# Patient Record
Sex: Female | Born: 1935 | Race: White | Hispanic: No | Marital: Married | Smoking: Never smoker
Health system: Southern US, Community
[De-identification: ages and names within clinical notes are randomized; demographics above are authoritative.]

## PROBLEM LIST (undated history)

## (undated) DIAGNOSIS — R7301 Impaired fasting glucose: Secondary | ICD-10-CM

## (undated) DIAGNOSIS — K921 Melena: Secondary | ICD-10-CM

## (undated) DIAGNOSIS — E119 Type 2 diabetes mellitus without complications: Secondary | ICD-10-CM

## (undated) DIAGNOSIS — E785 Hyperlipidemia, unspecified: Secondary | ICD-10-CM

## (undated) DIAGNOSIS — M1990 Unspecified osteoarthritis, unspecified site: Secondary | ICD-10-CM

## (undated) DIAGNOSIS — I509 Heart failure, unspecified: Secondary | ICD-10-CM

## (undated) DIAGNOSIS — I639 Cerebral infarction, unspecified: Secondary | ICD-10-CM

## (undated) DIAGNOSIS — I350 Nonrheumatic aortic (valve) stenosis: Secondary | ICD-10-CM

## (undated) DIAGNOSIS — Z87442 Personal history of urinary calculi: Secondary | ICD-10-CM

## (undated) DIAGNOSIS — I499 Cardiac arrhythmia, unspecified: Secondary | ICD-10-CM

## (undated) DIAGNOSIS — I4891 Unspecified atrial fibrillation: Secondary | ICD-10-CM

## (undated) DIAGNOSIS — R0600 Dyspnea, unspecified: Secondary | ICD-10-CM

## (undated) DIAGNOSIS — C801 Malignant (primary) neoplasm, unspecified: Secondary | ICD-10-CM

## (undated) DIAGNOSIS — Z86010 Personal history of colonic polyps: Secondary | ICD-10-CM

## (undated) HISTORY — DX: Hyperlipidemia, unspecified: E78.5

## (undated) HISTORY — DX: Heart failure, unspecified: I50.9

## (undated) HISTORY — DX: Melena: K92.1

## (undated) HISTORY — DX: Unspecified atrial fibrillation: I48.91

## (undated) HISTORY — DX: Personal history of colonic polyps: Z86.010

## (undated) HISTORY — DX: Nonrheumatic aortic (valve) stenosis: I35.0

## (undated) HISTORY — PX: EYE SURGERY: SHX253

## (undated) HISTORY — DX: Impaired fasting glucose: R73.01

---

## 1998-05-04 ENCOUNTER — Ambulatory Visit (HOSPITAL_COMMUNITY): Admission: RE | Admit: 1998-05-04 | Discharge: 1998-05-04 | Payer: Self-pay | Admitting: Obstetrics & Gynecology

## 1999-05-05 ENCOUNTER — Ambulatory Visit (HOSPITAL_COMMUNITY): Admission: RE | Admit: 1999-05-05 | Discharge: 1999-05-05 | Payer: Self-pay | Admitting: Family Medicine

## 2000-11-23 ENCOUNTER — Encounter: Payer: Self-pay | Admitting: Family Medicine

## 2000-11-23 ENCOUNTER — Ambulatory Visit (HOSPITAL_COMMUNITY): Admission: RE | Admit: 2000-11-23 | Discharge: 2000-11-23 | Payer: Self-pay | Admitting: Family Medicine

## 2001-12-12 ENCOUNTER — Other Ambulatory Visit: Admission: RE | Admit: 2001-12-12 | Discharge: 2001-12-12 | Payer: Self-pay | Admitting: Dermatology

## 2002-09-19 ENCOUNTER — Ambulatory Visit (HOSPITAL_COMMUNITY): Admission: RE | Admit: 2002-09-19 | Discharge: 2002-09-19 | Payer: Self-pay | Admitting: Internal Medicine

## 2003-02-21 DIAGNOSIS — Z86010 Personal history of colonic polyps: Secondary | ICD-10-CM

## 2003-02-21 DIAGNOSIS — Z860101 Personal history of adenomatous and serrated colon polyps: Secondary | ICD-10-CM

## 2003-02-21 HISTORY — DX: Personal history of colonic polyps: Z86.010

## 2003-02-21 HISTORY — DX: Personal history of adenomatous and serrated colon polyps: Z86.0101

## 2003-03-12 ENCOUNTER — Ambulatory Visit (HOSPITAL_COMMUNITY): Admission: RE | Admit: 2003-03-12 | Discharge: 2003-03-12 | Payer: Self-pay | Admitting: Internal Medicine

## 2004-03-18 ENCOUNTER — Ambulatory Visit (HOSPITAL_COMMUNITY): Admission: RE | Admit: 2004-03-18 | Discharge: 2004-03-18 | Payer: Self-pay | Admitting: Internal Medicine

## 2005-03-21 ENCOUNTER — Ambulatory Visit (HOSPITAL_COMMUNITY): Admission: RE | Admit: 2005-03-21 | Discharge: 2005-03-21 | Payer: Self-pay | Admitting: Internal Medicine

## 2006-03-27 ENCOUNTER — Ambulatory Visit (HOSPITAL_COMMUNITY): Admission: RE | Admit: 2006-03-27 | Discharge: 2006-03-27 | Payer: Self-pay | Admitting: Internal Medicine

## 2006-04-03 ENCOUNTER — Encounter: Admission: RE | Admit: 2006-04-03 | Discharge: 2006-04-03 | Payer: Self-pay | Admitting: Internal Medicine

## 2007-02-21 HISTORY — PX: COLONOSCOPY: SHX174

## 2007-03-27 ENCOUNTER — Ambulatory Visit (HOSPITAL_COMMUNITY): Admission: RE | Admit: 2007-03-27 | Discharge: 2007-03-27 | Payer: Self-pay | Admitting: Internal Medicine

## 2007-05-13 ENCOUNTER — Encounter: Admission: RE | Admit: 2007-05-13 | Discharge: 2007-05-13 | Payer: Self-pay | Admitting: Internal Medicine

## 2008-03-24 ENCOUNTER — Encounter: Payer: Self-pay | Admitting: Cardiology

## 2008-03-24 LAB — CONVERTED CEMR LAB
LDL (calc): 108 mg/dL
TSH: 2.32 microintl units/mL

## 2008-04-21 ENCOUNTER — Ambulatory Visit: Payer: Self-pay | Admitting: Internal Medicine

## 2008-04-27 ENCOUNTER — Encounter: Payer: Self-pay | Admitting: Internal Medicine

## 2008-05-04 ENCOUNTER — Ambulatory Visit: Payer: Self-pay | Admitting: Internal Medicine

## 2008-05-04 ENCOUNTER — Ambulatory Visit (HOSPITAL_COMMUNITY): Admission: RE | Admit: 2008-05-04 | Discharge: 2008-05-04 | Payer: Self-pay | Admitting: Internal Medicine

## 2008-05-07 ENCOUNTER — Encounter: Payer: Self-pay | Admitting: Internal Medicine

## 2008-07-06 ENCOUNTER — Encounter: Admission: RE | Admit: 2008-07-06 | Discharge: 2008-07-06 | Payer: Self-pay | Admitting: Internal Medicine

## 2008-07-29 ENCOUNTER — Ambulatory Visit (HOSPITAL_COMMUNITY): Admission: RE | Admit: 2008-07-29 | Discharge: 2008-07-29 | Payer: Self-pay | Admitting: Internal Medicine

## 2008-07-29 ENCOUNTER — Encounter (INDEPENDENT_AMBULATORY_CARE_PROVIDER_SITE_OTHER): Payer: Self-pay | Admitting: Internal Medicine

## 2008-07-29 ENCOUNTER — Ambulatory Visit: Payer: Self-pay | Admitting: Cardiology

## 2008-08-13 ENCOUNTER — Ambulatory Visit: Payer: Self-pay | Admitting: Cardiology

## 2008-08-13 ENCOUNTER — Ambulatory Visit (HOSPITAL_COMMUNITY): Admission: RE | Admit: 2008-08-13 | Discharge: 2008-08-13 | Payer: Self-pay | Admitting: Cardiology

## 2008-08-17 ENCOUNTER — Encounter (HOSPITAL_COMMUNITY): Admission: RE | Admit: 2008-08-17 | Discharge: 2008-09-16 | Payer: Self-pay | Admitting: Cardiology

## 2008-08-17 ENCOUNTER — Ambulatory Visit: Payer: Self-pay | Admitting: Cardiology

## 2008-09-01 ENCOUNTER — Telehealth (INDEPENDENT_AMBULATORY_CARE_PROVIDER_SITE_OTHER): Payer: Self-pay

## 2008-09-09 DIAGNOSIS — I482 Chronic atrial fibrillation, unspecified: Secondary | ICD-10-CM | POA: Insufficient documentation

## 2008-09-09 DIAGNOSIS — I4891 Unspecified atrial fibrillation: Secondary | ICD-10-CM | POA: Insufficient documentation

## 2008-09-16 ENCOUNTER — Ambulatory Visit: Payer: Self-pay | Admitting: Cardiology

## 2008-09-24 ENCOUNTER — Encounter: Payer: Self-pay | Admitting: Cardiology

## 2008-09-24 LAB — CONVERTED CEMR LAB
INR: 0.9 (ref 0.0–1.5)
Prothrombin Time: 12.8 s (ref 11.6–15.2)

## 2008-09-28 ENCOUNTER — Encounter (INDEPENDENT_AMBULATORY_CARE_PROVIDER_SITE_OTHER): Payer: Self-pay

## 2008-09-30 ENCOUNTER — Encounter: Payer: Self-pay | Admitting: Cardiology

## 2008-09-30 ENCOUNTER — Ambulatory Visit: Payer: Self-pay | Admitting: Cardiology

## 2008-09-30 ENCOUNTER — Ambulatory Visit (HOSPITAL_COMMUNITY): Admission: RE | Admit: 2008-09-30 | Discharge: 2008-09-30 | Payer: Self-pay | Admitting: Cardiology

## 2008-10-01 ENCOUNTER — Encounter: Payer: Self-pay | Admitting: Cardiology

## 2008-10-01 ENCOUNTER — Ambulatory Visit: Payer: Self-pay | Admitting: Cardiology

## 2008-10-05 ENCOUNTER — Encounter: Payer: Self-pay | Admitting: *Deleted

## 2008-10-05 ENCOUNTER — Ambulatory Visit: Payer: Self-pay | Admitting: Cardiology

## 2008-10-05 LAB — CONVERTED CEMR LAB: POC INR: 2.2

## 2008-10-08 ENCOUNTER — Ambulatory Visit: Payer: Self-pay | Admitting: Cardiology

## 2008-10-15 ENCOUNTER — Ambulatory Visit: Payer: Self-pay

## 2008-10-28 ENCOUNTER — Ambulatory Visit: Payer: Self-pay | Admitting: Cardiology

## 2008-10-28 LAB — CONVERTED CEMR LAB: POC INR: 4.4

## 2008-11-05 ENCOUNTER — Ambulatory Visit: Payer: Self-pay

## 2008-11-18 ENCOUNTER — Ambulatory Visit: Payer: Self-pay | Admitting: Cardiology

## 2008-11-18 LAB — CONVERTED CEMR LAB: POC INR: 2.3

## 2008-12-07 ENCOUNTER — Ambulatory Visit: Payer: Self-pay | Admitting: Cardiology

## 2008-12-07 LAB — CONVERTED CEMR LAB: POC INR: 2.2

## 2009-01-04 ENCOUNTER — Ambulatory Visit: Payer: Self-pay | Admitting: Cardiology

## 2009-01-20 DIAGNOSIS — K921 Melena: Secondary | ICD-10-CM | POA: Insufficient documentation

## 2009-01-20 HISTORY — DX: Melena: K92.1

## 2009-02-03 ENCOUNTER — Telehealth (INDEPENDENT_AMBULATORY_CARE_PROVIDER_SITE_OTHER): Payer: Self-pay | Admitting: *Deleted

## 2009-02-03 ENCOUNTER — Inpatient Hospital Stay (HOSPITAL_COMMUNITY): Admission: EM | Admit: 2009-02-03 | Discharge: 2009-02-05 | Payer: Self-pay | Admitting: Emergency Medicine

## 2009-02-04 ENCOUNTER — Ambulatory Visit: Payer: Self-pay | Admitting: Internal Medicine

## 2009-02-05 LAB — CONVERTED CEMR LAB
HCT: 35.3 %
Hemoglobin: 11.9 g/dL
MCV: 88.5 fL
Platelets: 160 10*3/uL
WBC: 9.1 10*3/uL

## 2009-02-08 ENCOUNTER — Ambulatory Visit: Payer: Self-pay | Admitting: Cardiology

## 2009-02-08 ENCOUNTER — Encounter (INDEPENDENT_AMBULATORY_CARE_PROVIDER_SITE_OTHER): Payer: Self-pay | Admitting: *Deleted

## 2009-02-08 DIAGNOSIS — E785 Hyperlipidemia, unspecified: Secondary | ICD-10-CM | POA: Insufficient documentation

## 2009-02-08 DIAGNOSIS — R809 Proteinuria, unspecified: Secondary | ICD-10-CM | POA: Insufficient documentation

## 2009-02-08 LAB — CONVERTED CEMR LAB: POC INR: 1.3

## 2009-02-22 ENCOUNTER — Encounter (INDEPENDENT_AMBULATORY_CARE_PROVIDER_SITE_OTHER): Payer: Self-pay | Admitting: *Deleted

## 2009-02-22 ENCOUNTER — Ambulatory Visit: Payer: Self-pay | Admitting: Cardiology

## 2009-02-22 LAB — CONVERTED CEMR LAB
OCCULT 2: NEGATIVE
POC INR: 2.3

## 2009-02-23 ENCOUNTER — Encounter (INDEPENDENT_AMBULATORY_CARE_PROVIDER_SITE_OTHER): Payer: Self-pay | Admitting: *Deleted

## 2009-02-26 ENCOUNTER — Encounter: Payer: Self-pay | Admitting: Internal Medicine

## 2009-03-25 ENCOUNTER — Ambulatory Visit: Payer: Self-pay | Admitting: Cardiology

## 2009-03-25 LAB — CONVERTED CEMR LAB: POC INR: 2.2

## 2009-03-29 ENCOUNTER — Encounter: Payer: Self-pay | Admitting: Cardiology

## 2009-03-29 ENCOUNTER — Encounter (INDEPENDENT_AMBULATORY_CARE_PROVIDER_SITE_OTHER): Payer: Self-pay | Admitting: *Deleted

## 2009-03-29 LAB — CONVERTED CEMR LAB
Basophils Relative: 0 %
Basophils Relative: 0 % (ref 0–1)
Eosinophils Relative: 2 % (ref 0–5)
HCT: 42.8 % (ref 36.0–46.0)
Hemoglobin: 13.6 g/dL
Hemoglobin: 13.6 g/dL (ref 12.0–15.0)
Lymphocytes Relative: 46 % (ref 12–46)
Lymphs Abs: 3.2 10*3/uL
Lymphs Abs: 3.2 10*3/uL (ref 0.7–4.0)
MCHC: 31.8 g/dL
MCHC: 31.8 g/dL (ref 30.0–36.0)
MCV: 89.7 fL
MCV: 89.7 fL (ref 78.0–100.0)
Monocytes Relative: 8 %
Platelets: 240 10*3/uL
Platelets: 240 10*3/uL (ref 150–400)
RDW: 13.9 %
RDW: 13.9 % (ref 11.5–15.5)

## 2009-03-30 ENCOUNTER — Encounter: Payer: Self-pay | Admitting: Cardiology

## 2009-04-22 ENCOUNTER — Ambulatory Visit: Payer: Self-pay | Admitting: Cardiology

## 2009-05-26 ENCOUNTER — Ambulatory Visit: Payer: Self-pay | Admitting: Cardiology

## 2009-05-26 LAB — CONVERTED CEMR LAB: POC INR: 2.6

## 2009-06-30 ENCOUNTER — Ambulatory Visit: Payer: Self-pay | Admitting: Cardiology

## 2009-07-09 ENCOUNTER — Encounter: Admission: RE | Admit: 2009-07-09 | Discharge: 2009-07-09 | Payer: Self-pay | Admitting: Internal Medicine

## 2009-07-28 ENCOUNTER — Ambulatory Visit: Payer: Self-pay | Admitting: Cardiology

## 2009-08-26 ENCOUNTER — Ambulatory Visit: Payer: Self-pay | Admitting: Cardiology

## 2009-08-26 LAB — CONVERTED CEMR LAB: POC INR: 2.3

## 2009-09-23 ENCOUNTER — Ambulatory Visit: Payer: Self-pay | Admitting: Cardiology

## 2009-10-28 ENCOUNTER — Ambulatory Visit: Payer: Self-pay | Admitting: Cardiology

## 2009-11-25 ENCOUNTER — Ambulatory Visit: Payer: Self-pay | Admitting: Cardiology

## 2009-12-23 ENCOUNTER — Ambulatory Visit: Payer: Self-pay | Admitting: Cardiology

## 2010-02-02 ENCOUNTER — Ambulatory Visit: Payer: Self-pay | Admitting: Cardiovascular Disease

## 2010-02-02 LAB — CONVERTED CEMR LAB: POC INR: 2.4

## 2010-02-16 ENCOUNTER — Encounter (INDEPENDENT_AMBULATORY_CARE_PROVIDER_SITE_OTHER): Payer: Self-pay | Admitting: *Deleted

## 2010-02-17 ENCOUNTER — Encounter: Payer: Self-pay | Admitting: Cardiology

## 2010-02-17 ENCOUNTER — Ambulatory Visit: Payer: Self-pay | Admitting: Cardiology

## 2010-03-02 ENCOUNTER — Ambulatory Visit: Admission: RE | Admit: 2010-03-02 | Discharge: 2010-03-02 | Payer: Self-pay | Source: Home / Self Care

## 2010-03-02 ENCOUNTER — Encounter: Payer: Self-pay | Admitting: Cardiology

## 2010-03-02 ENCOUNTER — Encounter (INDEPENDENT_AMBULATORY_CARE_PROVIDER_SITE_OTHER): Payer: Self-pay | Admitting: *Deleted

## 2010-03-02 LAB — CONVERTED CEMR LAB: OCCULT 2: NEGATIVE

## 2010-03-24 NOTE — Medication Information (Signed)
Summary: ccr-lr  Anticoagulant Therapy  Managed by: Anne Hey, RN PCP: Dr. Carylon Perches Supervising MD: Dietrich Pates MD, Molly Maduro Indication 1: Atrial Fibrillation Lab Used: [facility] [place] Site: [place] INR POC 2.2  Dietary changes: no    Health status changes: no    Bleeding/hemorrhagic complications: no    Recent/future hospitalizations: no    Any changes in medication regimen? no    Recent/future dental: no  Any missed doses?: no       Is patient compliant with meds? yes       Allergies: No Known Drug Allergies  Anticoagulation Management History:      The patient is taking warfarin and comes in today for a routine follow up visit.  Positive risk factors for bleeding include an age of 20 years or older.  The bleeding index is 'intermediate risk'.  Positive CHADS2 values include History of HTN.  Negative CHADS2 values include Age > 9 years old.  The start date was 10/01/2008.  Her last INR was 0.9.  Anticoagulation responsible provider: Dietrich Pates MD, Molly Maduro.  INR POC: 2.2.  Cuvette Lot#: [id number].  Exp: 10/11.    Anticoagulation Management Assessment/Plan:      The patient's current anticoagulation dose is Warfarin sodium 2 mg tabs: Take 1 tablet daily or as directed by Anticoagualtion Clinic.  The target INR is 2.0-3.0.  The next INR is due 12/23/2009.  Anticoagulation instructions were given to patient.  Results were reviewed/authorized by Anne Hey, RN.  She was notified by Anne Hey RN.         Prior Anticoagulation Instructions: INR 2.2 Continue coumadin 2mg  once daily except 1mg  on Mondays and Thursdays  Current Anticoagulation Instructions: Same as Prior Instructions.

## 2010-03-24 NOTE — Medication Information (Signed)
Summary: ccr-lr  Anticoagulant Therapy  Managed by: Vashti Hey, RN PCP: Dr. Carylon Perches Supervising MD: Dietrich Pates MD, Molly Maduro Indication 1: Atrial Fibrillation Lab Used: [facility] [place] Site: [place] INR POC 2.2  Dietary changes: no    Health status changes: no    Bleeding/hemorrhagic complications: no    Recent/future hospitalizations: no    Any changes in medication regimen? no    Recent/future dental: no  Any missed doses?: no       Is patient compliant with meds? yes       Allergies: No Known Drug Allergies  Anticoagulation Management History:      The patient is taking warfarin and comes in today for a routine follow up visit.  Positive risk factors for bleeding include an age of 75 years or older.  The bleeding index is 'intermediate risk'.  Positive CHADS2 values include History of HTN.  Negative CHADS2 values include Age > 63 years old.  The start date was 10/01/2008.  Her last INR was 0.9.  Anticoagulation responsible provider: Dietrich Pates MD, Molly Maduro.  INR POC: 2.2.  Cuvette Lot#: [id number].  Exp: 10/11.    Anticoagulation Management Assessment/Plan:      The patient's current anticoagulation dose is Warfarin sodium 2 mg tabs: Take 1 tablet daily or as directed by Anticoagualtion Clinic.  The target INR is 2.0-3.0.  The next INR is due 11/25/2009.  Anticoagulation instructions were given to patient.  Results were reviewed/authorized by Vashti Hey, RN.  She was notified by Vashti Hey RN.         Prior Anticoagulation Instructions: INR 2.5 Continue coumadin 2mg  once daily except 1mg  on Mondays and Thursdays  Current Anticoagulation Instructions: INR 2.2 Continue coumadin 2mg  once daily except 1mg  on Mondays and Thursdays

## 2010-03-24 NOTE — Medication Information (Signed)
Summary: ccr-lr  Anticoagulant Therapy  Managed by: Vashti Hey, RN PCP: Dr. Carylon Perches Supervising MD: Diona Browner MD, Remi Deter Indication 1: Atrial Fibrillation Lab Used: [facility] [place] Site: [place] INR POC 2.6  Dietary changes: no    Health status changes: no    Bleeding/hemorrhagic complications: no    Recent/future hospitalizations: no    Any changes in medication regimen? no    Recent/future dental: no  Any missed doses?: no       Is patient compliant with meds? yes       Allergies: No Known Drug Allergies  Anticoagulation Management History:      The patient is taking warfarin and comes in today for a routine follow up visit.  Positive risk factors for bleeding include an age of 75 years or older.  The bleeding index is 'intermediate risk'.  Positive CHADS2 values include History of HTN.  Negative CHADS2 values include Age > 61 years old.  The start date was 10/01/2008.  Her last INR was 0.9.  Anticoagulation responsible provider: Diona Browner MD, Remi Deter.  INR POC: 2.6.  Cuvette Lot#: [id number].  Exp: 10/11.    Anticoagulation Management Assessment/Plan:      The patient's current anticoagulation dose is Warfarin sodium 2 mg tabs: Take 1 tablet daily or as directed by Anticoagualtion Clinic.  The target INR is 2.0-3.0.  The next INR is due 06/23/2009.  Anticoagulation instructions were given to patient.  Results were reviewed/authorized by Vashti Hey, RN.  She was notified by Vashti Hey RN.         Prior Anticoagulation Instructions: INR 2.1 Continue coumadin 2mg  once daily except 1mg  on Mondays and Thursdays  Current Anticoagulation Instructions: INR 2.6 Continue coumadin 2mg  once daily except 1mg  on Mondays and Thursdays

## 2010-03-24 NOTE — Medication Information (Signed)
Summary: ccr-lr  Anticoagulant Therapy  Managed by: Vashti Hey, RN PCP: Dr. Carylon Perches Supervising MD: Diona Browner MD, Remi Deter Indication 1: Atrial Fibrillation Lab Used: [facility] [place] Site: [place] INR POC 2.4  Dietary changes: no    Health status changes: no    Bleeding/hemorrhagic complications: no    Recent/future hospitalizations: no    Any changes in medication regimen? no    Recent/future dental: no  Any missed doses?: no       Is patient compliant with meds? yes       Allergies: No Known Drug Allergies  Anticoagulation Management History:      The patient is taking warfarin and comes in today for a routine follow up visit.  Positive risk factors for bleeding include an age of 75 years or older.  The bleeding index is 'intermediate risk'.  Positive CHADS2 values include History of HTN.  Negative CHADS2 values include Age > 12 years old.  The start date was 10/01/2008.  Her last INR was 0.9.  Anticoagulation responsible provider: Diona Browner MD, Remi Deter.  INR POC: 2.4.  Cuvette Lot#: [id number].  Exp: 10/11.    Anticoagulation Management Assessment/Plan:      The patient's current anticoagulation dose is Warfarin sodium 2 mg tabs: Take 1 tablet daily or as directed by Anticoagualtion Clinic.  The target INR is 2.0-3.0.  The next INR is due 03/02/2010.  Anticoagulation instructions were given to patient.  Results were reviewed/authorized by Vashti Hey, RN.  She was notified by Vashti Hey RN.         Prior Anticoagulation Instructions: INR 2.0 Take coumadin 1 tablet tonight then resume 1 tablet once daily except 1/2 tablet on Mondays and Thursdays Pt will be out of town till 01/31/10.  INR's have been stable  Current Anticoagulation Instructions: INR 2.4 Continue coumadin 2mg  once daily except 1mg  on Mondays and Thursdays

## 2010-03-24 NOTE — Letter (Signed)
Summary: [place] [employer], agricultural at [facility]  [address] [place] [address]   Phone: [phone]  Fax: [phone]      March 30, 2009 MRN: [id number]   [facility] [place] [address] [place], [place]  [address]   Dear Ms. DAMAS,  Your test ordered by Selena Batten has been reviewed by your physician (or physician assistant) and was found to be normal or stable. Your physician (or physician assistant) felt no changes were needed at this time.  ____ Echocardiogram  ____ Cardiac Stress Test  __X__ Lab Work  ____ Peripheral vascular study of arms, legs or neck  ____ CT scan or X-ray  ____ Lung or Breathing test  ____ Other: Plese continue on current medical treatment. Thank you.   Adrian Bing, MD, F.A.C.C

## 2010-03-24 NOTE — Medication Information (Signed)
Summary: ccr-lr  Anticoagulant Therapy  Managed by: Vashti Hey, RN PCP: Dr. Carylon Perches Supervising MD: Dietrich Pates MD, Molly Maduro Indication 1: Atrial Fibrillation Lab Used: [facility] [place] Site: [place] INR POC 2.2  Dietary changes: no    Health status changes: no    Bleeding/hemorrhagic complications: no    Recent/future hospitalizations: no    Any changes in medication regimen? no    Recent/future dental: no  Any missed doses?: yes     Details: Missed 1 dose 3 weeks ago  Is patient compliant with meds? yes       Allergies: No Known Drug Allergies  Anticoagulation Management History:      The patient is taking warfarin and comes in today for a routine follow up visit.  Positive risk factors for bleeding include an age of 75 years or older.  The bleeding index is 'intermediate risk'.  Positive CHADS2 values include History of HTN.  Negative CHADS2 values include Age > 20 years old.  The start date was 10/01/2008.  Her last INR was 0.9.  Anticoagulation responsible provider: Dietrich Pates MD, Molly Maduro.  INR POC: 2.2.  Cuvette Lot#: [id number].  Exp: 10/11.    Anticoagulation Management Assessment/Plan:      The patient's current anticoagulation dose is Warfarin sodium 2 mg tabs: Take 1 tablet daily or as directed by Anticoagualtion Clinic.  The target INR is 2.0-3.0.  The next INR is due 04/22/2009.  Anticoagulation instructions were given to patient.  Results were reviewed/authorized by Vashti Hey, RN.  She was notified by Vashti Hey RN.         Prior Anticoagulation Instructions: INR 2.3 Continue coumadin 2mg  once daily except 1 mg on Mondays and Thursdays  Current Anticoagulation Instructions: INR 2.2 Continue coumadin 2mg  once daily except 1mg  on Mondays and Thursdays

## 2010-03-24 NOTE — Medication Information (Signed)
Summary: ccr-lr  Anticoagulant Therapy  Managed by: Vashti Hey, RN PCP: Dr. Carylon Perches Supervising MD: Dietrich Pates MD, Molly Maduro Indication 1: Atrial Fibrillation Lab Used: [facility] [place] Site: [place] INR POC 2.0  Dietary changes: no    Health status changes: no    Bleeding/hemorrhagic complications: no    Recent/future hospitalizations: no    Any changes in medication regimen? no    Recent/future dental: no  Any missed doses?: no       Is patient compliant with meds? yes       Allergies: No Known Drug Allergies  Anticoagulation Management History:      The patient is taking warfarin and comes in today for a routine follow up visit.  Positive risk factors for bleeding include an age of 75 years or older.  The bleeding index is 'intermediate risk'.  Positive CHADS2 values include History of HTN.  Negative CHADS2 values include Age > 65 years old.  The start date was 10/01/2008.  Her last INR was 0.9.  Anticoagulation responsible provider: Dietrich Pates MD, Molly Maduro.  INR POC: 2.0.  Cuvette Lot#: [id number].  Exp: 10/11.    Anticoagulation Management Assessment/Plan:      The patient's current anticoagulation dose is Warfarin sodium 2 mg tabs: Take 1 tablet daily or as directed by Anticoagualtion Clinic.  The target INR is 2.0-3.0.  The next INR is due 02/02/2010.  Anticoagulation instructions were given to patient.  Results were reviewed/authorized by Vashti Hey, RN.  She was notified by Vashti Hey RN.         Prior Anticoagulation Instructions: INR 2.2 Continue coumadin 2mg  once daily except 1mg  on Mondays and Thursdays  Current Anticoagulation Instructions: INR 2.0 Take coumadin 1 tablet tonight then resume 1 tablet once daily except 1/2 tablet on Mondays and Thursdays Pt will be out of town till 01/31/10.  INR's have been stable

## 2010-03-24 NOTE — Medication Information (Signed)
Summary: ccr-lr  Anticoagulant Therapy  Managed by: Vashti Hey, RN PCP: Dr. Carylon Perches Supervising MD: Diona Browner MD, Remi Deter Indication 1: Atrial Fibrillation Lab Used: [facility] [place] Site: [place] INR POC 2.2  Dietary changes: no    Health status changes: no    Bleeding/hemorrhagic complications: no    Recent/future hospitalizations: no    Any changes in medication regimen? no    Recent/future dental: no  Any missed doses?: no       Is patient compliant with meds? yes       Allergies: No Known Drug Allergies  Anticoagulation Management History:      The patient is taking warfarin and comes in today for a routine follow up visit.  Positive risk factors for bleeding include an age of 75 years or older.  The bleeding index is 'intermediate risk'.  Positive CHADS2 values include History of HTN.  Negative CHADS2 values include Age > 74 years old.  The start date was 10/01/2008.  Her last INR was 0.9.  Anticoagulation responsible provider: Diona Browner MD, Remi Deter.  INR POC: 2.2.  Cuvette Lot#: [id number].  Exp: 10/11.    Anticoagulation Management Assessment/Plan:      The patient's current anticoagulation dose is Warfarin sodium 2 mg tabs: Take 1 tablet daily or as directed by Anticoagualtion Clinic.  The target INR is 2.0-3.0.  The next INR is due 07/29/2009.  Anticoagulation instructions were given to patient.  Results were reviewed/authorized by Vashti Hey, RN.  She was notified by Vashti Hey RN.         Prior Anticoagulation Instructions: INR 2.6 Continue coumadin 2mg  once daily except 1mg  on Mondays and Thursdays  Current Anticoagulation Instructions: INR 2.2 Continue coumadin 2mg  once daily except 1mg  on Mondays and Thursdays Prescriptions: WARFARIN SODIUM 2 MG TABS (WARFARIN SODIUM) Take 1 tablet daily or as directed by Anticoagualtion Clinic  #90 x 3   Entered by:   Vashti Hey RN   Authorized by:   Kathlen Brunswick, MD, [facility]   Signed by:   Vashti Hey RN  on 06/30/2009   Method used:   Electronically to        [facility] [phone]* (retail)       [address]       [place], [place]  [address]       Ph: [phone] or [phone]       Fax: [phone]   RxID:   [id number]

## 2010-03-24 NOTE — Medication Information (Signed)
Summary: ccr-lr  Anticoagulant Therapy  Managed by: Vashti Hey, RN PCP: Dr. Carylon Perches Supervising MD: Diona Browner MD, Remi Deter Indication 1: Atrial Fibrillation Lab Used: [facility] [place] Site: [place] INR POC 2.1  Dietary changes: no    Health status changes: no    Bleeding/hemorrhagic complications: no    Recent/future hospitalizations: no    Any changes in medication regimen? no    Recent/future dental: no  Any missed doses?: no       Is patient compliant with meds? yes       Allergies: No Known Drug Allergies  Anticoagulation Management History:      The patient is taking warfarin and comes in today for a routine follow up visit.  Positive risk factors for bleeding include an age of 75 years or older.  The bleeding index is 'intermediate risk'.  Positive CHADS2 values include History of HTN.  Negative CHADS2 values include Age > 75 years old.  The start date was 10/01/2008.  Her last INR was 0.9.  Anticoagulation responsible provider: Diona Browner MD, Remi Deter.  INR POC: 2.1.  Cuvette Lot#: [id number].  Exp: 10/11.    Anticoagulation Management Assessment/Plan:      The patient's current anticoagulation dose is Warfarin sodium 2 mg tabs: Take 1 tablet daily or as directed by Anticoagualtion Clinic.  The target INR is 2.0-3.0.  The next INR is due 05/26/2009.  Anticoagulation instructions were given to patient.  Results were reviewed/authorized by Vashti Hey, RN.  She was notified by Vashti Hey RN.         Prior Anticoagulation Instructions: INR 2.2 Continue coumadin 2mg  once daily except 1mg  on Mondays and Thursdays  Current Anticoagulation Instructions: INR 2.1 Continue coumadin 2mg  once daily except 1mg  on Mondays and Thursdays

## 2010-03-24 NOTE — Assessment & Plan Note (Signed)
Summary: 1 yr f/u per checkout on 02/08/09/tg   Visit Type:  Follow-up Primary Provider:  Dr. Carylon Perches   History of Present Illness: Anne Wolf returns to the office as scheduled for continued assessment and treatment of permanent atrial fibrillation requiring anticoagulation.  Since her last visit, she has done quite well.  Lifestyle is sedentary, but she experiences no cardiopulmonary symptoms.  Specifically, she denies orthopnea, PND, chest discomfort, dyspnea, lightheadedness, palpitations, or syncope.  She has not required urgent medical care nor has she developed any new medical problems.  She notes a good year, having become a grandmother for the second time with a healthy grandchild.  Her first granddaughter had Hurler's syndrome and is deceased.  EKG  Procedure date:  02/17/2010  Findings:      Rhythm Strip  Atrial fibrillation Controlled ventricular response with a heart rate of 75 bpm   Current Medications (verified): 1)  Warfarin Sodium 2 Mg Tabs (Warfarin Sodium) .... Take 1 Tablet Daily or As Directed By Anticoagualtion Clinic 2)  Centrum Silver  Tabs (Multiple Vitamins-Minerals) .... Take 1 Tab Daily 3)  Toprol Xl 25 Mg Xr24h-Tab (Metoprolol Succinate) .... Take 1 Tab Daily 4)  Simvastatin 40 Mg Tabs (Simvastatin) .... Take 1 Tab Daily 5)  Lisinopril 10 Mg Tabs (Lisinopril) .... Take 1 Tab Daily 6)  Alendronate Sodium 70 Mg Tabs (Alendronate Sodium) .... Take 1 Tab Weekly 7)  Ra Fish Oil 1000 Mg Caps (Omega-3 Fatty Acids) .... Take 1 Cap Daily 8)  Co-Enzyme Q-10 100 Mg Caps (Coenzyme Q10) .... Take 1 Tab Daily 9)  Oyst-Cal 500 Mg Tabs (Oyster Shell) .... Take 1 Tab Daily  Allergies (verified): No Known Drug Allergies  Comments:  Nurse/Medical Assistant: patient brought med list that has been updated per her statement [facility] [place] primary m.d is Carylon Perches  Past History:  PMH, FH, and Social History reviewed and updated.  Past Medical  History: ATRIAL FIBRILLATION (ICD-427.31)-adequate heart rate control on no medications Aortic stenosis-mild; mild MR; slightly increased pulmonary artery pressure with mild RVH; normal LV-2010 Hyperlipidemia Fasting hyperglycemia; elevated microalbuminuria Hematochezia (01/2009)-presumed ischemic colitis; history of diverticulosis Colonoscopy approximately 5 years ago yielded an adenomatous polyp; repeat procedure 2 years ago was negative    Family History: Father:deceased due to cancer Mother:alive has pacemaker Granddaughter-deceased at age to secondary to Hurler's  Review of Systems       See history of present illness.  Vital Signs:  Patient profile:   77 year old female Weight:      178 pounds BMI:     31.65 O2 Sat:      98 % on Room air Pulse rate:   97 / minute BP sitting:   103 / 67  (right arm)  Vitals Entered By: Dreama Saa, CNA (February 17, 2010 11:11 AM)  O2 Flow:  Room air  Physical Exam  General:  Overweight; well developed; no acute distress:   Neck-No JVD; no carotid bruits: Lungs-No tachypnea, no rales; no rhonchi; no wheezes: Cardiovascular-normal PMI; normal S1 and S2; irregular rhythm; modest early systolic ejection murmur Abdomen-BS normal; soft and non-tender without masses or organomegaly:  Musculoskeletal-No deformities, no cyanosis or clubbing: Neurologic-Normal cranial nerves; symmetric strength and tone:  Skin-Warm, no significant lesions: Extremities-excellent distal pulses; no edema:     Impression & Recommendations:  Problem # 1:  ATRIAL FIBRILLATION (ICD-427.31) Atrial fibrillation is causing no apparent symptoms.  Risk of cardiogenic embolism is minimized with chronic anticoagulation.  She  has done well on Coumadin, but consideration could be given to alternative anticoagulants.  CBC and stool for Hemoccult testing will be obtained to exclude occult GI blood loss.  Problem # 2:  HYPERLIPIDEMIA (ICD-272.4) Lipid profile is  acceptable in this patient without known vascular disease.  Her updated medication list for this problem includes:    Simvastatin 40 Mg Tabs (Simvastatin) .Marland Kitchen... Take 1 tab daily  CHOL: 182 (03/24/2008)   LDL: 108 (03/24/2008)   HDL: 49 (03/24/2008)   TG: 127 (03/24/2008)  Problem # 3:  AORTIC STENOSIS-MILD (ICD-424.1) Stenosis appears to be minimal based upon physical findings.  I doubt she will experience any symptoms related to her aortic valve disease for decades, if at all.  [name] was congratulated on a 9 pounds weight loss since her last visit.  Other Orders: Hemoccult Cards (Take Home) (Hemoccult Cards) T-CBC w/Diff [phone])  Patient Instructions: 1)  Your physician recommends that you schedule a follow-up appointment in: 1 year 2)  Your physician recommends that you return for lab work in: today 3)  Your physician has asked that you test your stool for blood. It is necessary to test 3 different stool specimens for accuracy. You will be given 3 hemoccult cards for specimen collection. For each stool specimen, place a small portion of stool sample (from 2 different areas of the stool) into the 2 squares on the card. Close card. Repeat with 2 more stool specimens. Bring the cards back to the office for testing.

## 2010-03-24 NOTE — Miscellaneous (Signed)
Summary: labs cbcd,03/29/2009  Clinical Lists Changes  Observations: Added new observation of ABSOLUTE BAS: 0.0 K/uL (03/29/2009 10:18) Added new observation of BASOPHIL %: 0 % (03/29/2009 10:18) Added new observation of EOS ABSLT: 0.2 K/uL (03/29/2009 10:18) Added new observation of % EOS AUTO: 2 % (03/29/2009 10:18) Added new observation of ABSOLUTE MON: 0.5 K/uL (03/29/2009 10:18) Added new observation of MONOCYTE %: 8 % (03/29/2009 10:18) Added new observation of ABS LYMPHOCY: 3.2 K/uL (03/29/2009 10:18) Added new observation of LYMPHS %: 46 % (03/29/2009 10:18) Added new observation of PLATELETK/UL: 240 K/uL (03/29/2009 10:18) Added new observation of RDW: 13.9 % (03/29/2009 10:18) Added new observation of MCHC RBC: 31.8 g/dL (16/11/9602 54:09) Added new observation of MCV: 89.7 fL (03/29/2009 10:18) Added new observation of HCT: 42.8 % (03/29/2009 10:18) Added new observation of HGB: 13.6 g/dL (81/19/1478 29:56) Added new observation of RBC M/UL: 4.77 M/uL (03/29/2009 10:18) Added new observation of WBC COUNT: 7.0 10*3/microliter (03/29/2009 10:18)

## 2010-03-24 NOTE — Miscellaneous (Signed)
Summary: stool cards 02/22/09  Clinical Lists Changes  Observations: Added new observation of HEMOCCULT 3: neg (02/22/2009 9:24) Added new observation of HEMOCCULT 2: neg (02/22/2009 9:24) Added new observation of HEMOCCULT 1: neg (02/22/2009 9:24)

## 2010-03-24 NOTE — Medication Information (Signed)
Summary: ccr-lr  Anticoagulant Therapy  Managed by: Anne Hey, RN PCP: Anne Wolf Supervising Wolf: Anne Wolf, Remi Deter Indication 1: Atrial Fibrillation Lab Used: [facility] [place] Site: [place] INR POC 2.3  Dietary changes: no    Health status changes: no    Bleeding/hemorrhagic complications: no    Recent/future hospitalizations: no    Any changes in medication regimen? no    Recent/future dental: no  Any missed doses?: no       Is patient compliant with meds? yes       Allergies: No Known Drug Allergies  Anticoagulation Management History:      The patient is taking warfarin and comes in today for a routine follow up visit.  Positive risk factors for bleeding include an age of 47 years or older.  The bleeding index is 'intermediate risk'.  Positive CHADS2 values include History of HTN.  Negative CHADS2 values include Age > 38 years old.  The start date was 10/01/2008.  Her last INR was 0.9.  Anticoagulation responsible provider: Diona Browner Wolf, Remi Deter.  INR POC: 2.3.  Cuvette Lot#: [id number].  Exp: 10/11.    Anticoagulation Management Assessment/Plan:      The patient's current anticoagulation dose is Warfarin sodium 2 mg tabs: Take 1 tablet daily or as directed by Anticoagualtion Clinic.  The target INR is 2.0-3.0.  The next INR is due 03/25/2009.  Anticoagulation instructions were given to patient.  Results were reviewed/authorized by Anne Hey, RN.  She was notified by Anne Hey RN.         Prior Anticoagulation Instructions: INR 2.4 Continue coumadin 2mg  once daily except 1mg  on Mondays and Thursdays  Current Anticoagulation Instructions: INR 2.3 Continue coumadin 2mg  once daily except 1 mg on Mondays and Thursdays

## 2010-03-24 NOTE — Miscellaneous (Signed)
Summary: hemoccult cards  Clinical Lists Changes  Observations: Added new observation of HEMOCCULT 3: neg (03/02/2010 11:39) Added new observation of HEMOCCULT 2: neg (03/02/2010 11:39) Added new observation of HEMOCCULT 1: neg (03/02/2010 11:39)

## 2010-03-24 NOTE — Miscellaneous (Signed)
  Clinical Lists Changes  Medications: Changed medication from OYST-CAL 500 MG TABS (OYSTER SHELL) take 1 tab daily to CALCIUM 600 MG TABS (CALCIUM) Take 1 tablet by mouth once a day

## 2010-03-24 NOTE — Medication Information (Signed)
Summary: ccr-lr  Anticoagulant Therapy  Managed by: Vashti Hey, RN PCP: Dr. Carylon Perches Supervising MD: Diona Browner MD, Remi Deter Indication 1: Atrial Fibrillation Lab Used: [facility] [place] Site: [place] INR POC 2.3  Dietary changes: no    Health status changes: no    Bleeding/hemorrhagic complications: no    Recent/future hospitalizations: no    Any changes in medication regimen? no    Recent/future dental: no  Any missed doses?: no       Is patient compliant with meds? yes       Allergies: No Known Drug Allergies  Anticoagulation Management History:      The patient is taking warfarin and comes in today for a routine follow up visit.  Positive risk factors for bleeding include an age of 75 years or older.  The bleeding index is 'intermediate risk'.  Positive CHADS2 values include History of HTN.  Negative CHADS2 values include Age > 13 years old.  The start date was 10/01/2008.  Her last INR was 0.9.  Anticoagulation responsible provider: Diona Browner MD, Remi Deter.  INR POC: 2.3.  Cuvette Lot#: [id number].  Exp: 10/11.    Anticoagulation Management Assessment/Plan:      The patient's current anticoagulation dose is Warfarin sodium 2 mg tabs: Take 1 tablet daily or as directed by Anticoagualtion Clinic.  The target INR is 2.0-3.0.  The next INR is due 08/26/2009.  Anticoagulation instructions were given to patient.  Results were reviewed/authorized by Vashti Hey, RN.  She was notified by Vashti Hey RN.         Prior Anticoagulation Instructions: INR 2.2 Continue coumadin 2mg  once daily except 1mg  on Mondays and Thursdays  Current Anticoagulation Instructions: INR 2.3 Continue coumadin 2mg  once daily except 1mg  on Mondays and Thursdays

## 2010-03-24 NOTE — Medication Information (Signed)
Summary: ccr-lr  Anticoagulant Therapy  Managed by: Vashti Hey, RN PCP: Dr. Carylon Perches Supervising MD: Dietrich Pates MD, Molly Maduro Indication 1: Atrial Fibrillation Lab Used: [facility] [place] Site: [place] INR POC 2.0  Dietary changes: no    Health status changes: no    Bleeding/hemorrhagic complications: no    Recent/future hospitalizations: no    Any changes in medication regimen? no    Recent/future dental: no  Any missed doses?: no       Is patient compliant with meds? yes       Allergies: No Known Drug Allergies  Anticoagulation Management History:      The patient is taking warfarin and comes in today for a routine follow up visit.  Positive risk factors for bleeding include an age of 75 years or older.  The bleeding index is 'intermediate risk'.  Positive CHADS2 values include History of HTN.  Negative CHADS2 values include Age > 79 years old.  The start date was 10/01/2008.  Her last INR was 0.9.  Anticoagulation responsible provider: Dietrich Pates MD, Molly Maduro.  INR POC: 2.0.  Cuvette Lot#: [id number].  Exp: 10/11.    Anticoagulation Management Assessment/Plan:      The patient's current anticoagulation dose is Warfarin sodium 2 mg tabs: Take 1 tablet daily or as directed by Anticoagualtion Clinic.  The target INR is 2.0-3.0.  The next INR is due 03/30/2010.  Anticoagulation instructions were given to patient.  Results were reviewed/authorized by Vashti Hey, RN.  She was notified by Vashti Hey RN.         Prior Anticoagulation Instructions: INR 2.4 Continue coumadin 2mg  once daily except 1mg  on Mondays and Thursdays  Current Anticoagulation Instructions: INR 2.0 Take 1 tablet tomorrow then resume 1 tablet once daily except 1/2 tablets on Mondays and Thursdays

## 2010-03-24 NOTE — Medication Information (Signed)
Summary: ccr-lr  Anticoagulant Therapy  Managed by: Vashti Hey, RN PCP: Dr. Carylon Perches Supervising MD: Diona Browner MD, Remi Deter Indication 1: Atrial Fibrillation Lab Used: [facility] [place] Site: [place] INR POC 2.5  Dietary changes: no    Health status changes: no    Bleeding/hemorrhagic complications: no    Recent/future hospitalizations: no    Any changes in medication regimen? no    Recent/future dental: no  Any missed doses?: no       Is patient compliant with meds? yes       Allergies: No Known Drug Allergies  Anticoagulation Management History:      The patient is taking warfarin and comes in today for a routine follow up visit.  Positive risk factors for bleeding include an age of 30 years or older.  The bleeding index is 'intermediate risk'.  Positive CHADS2 values include History of HTN.  Negative CHADS2 values include Age > 72 years old.  The start date was 10/01/2008.  Her last INR was 0.9.  Anticoagulation responsible provider: Diona Browner MD, Remi Deter.  INR POC: 2.5.  Cuvette Lot#: [id number].  Exp: 10/11.    Anticoagulation Management Assessment/Plan:      The patient's current anticoagulation dose is Warfarin sodium 2 mg tabs: Take 1 tablet daily or as directed by Anticoagualtion Clinic.  The target INR is 2.0-3.0.  The next INR is due 10/28/2009.  Anticoagulation instructions were given to patient.  Results were reviewed/authorized by Vashti Hey, RN.  She was notified by Vashti Hey RN.         Prior Anticoagulation Instructions: INR 2.3 Continue coumadin 2mg  once daily except 1mg  on Mondays and Thursdays  Current Anticoagulation Instructions: INR 2.5 Continue coumadin 2mg  once daily except 1mg  on Mondays and Thursdays

## 2010-03-24 NOTE — Medication Information (Signed)
Summary: ccr-lr  Anticoagulant Therapy  Managed by: Vashti Hey, RN PCP: Dr. Carylon Perches Supervising MD: Diona Browner MD, Remi Deter Indication 1: Atrial Fibrillation Lab Used: [facility] [place] Site: [place] INR POC 2.3  Dietary changes: no    Health status changes: no    Bleeding/hemorrhagic complications: no    Recent/future hospitalizations: no    Any changes in medication regimen? no    Recent/future dental: no  Any missed doses?: no       Is patient compliant with meds? yes       Allergies: No Known Drug Allergies  Anticoagulation Management History:      The patient is taking warfarin and comes in today for a routine follow up visit.  Positive risk factors for bleeding include an age of 75 years or older.  The bleeding index is 'intermediate risk'.  Positive CHADS2 values include History of HTN.  Negative CHADS2 values include Age > 75 years old.  The start date was 10/01/2008.  Her last INR was 0.9.  Anticoagulation responsible provider: Diona Browner MD, Remi Deter.  INR POC: 2.3.  Cuvette Lot#: [id number].  Exp: 10/11.    Anticoagulation Management Assessment/Plan:      The patient's current anticoagulation dose is Warfarin sodium 2 mg tabs: Take 1 tablet daily or as directed by Anticoagualtion Clinic.  The target INR is 2.0-3.0.  The next INR is due 09/23/2009.  Anticoagulation instructions were given to patient.  Results were reviewed/authorized by Vashti Hey, RN.  She was notified by Vashti Hey RN.         Prior Anticoagulation Instructions: INR 2.3 Continue coumadin 2mg  once daily except 1mg  on Mondays and Thursdays  Current Anticoagulation Instructions: Same as Prior Instructions.

## 2010-03-25 NOTE — Consult Note (Signed)
Summary: Consultation Report  Consultation Report   Imported By: Diana Eves 02/26/2009 14:59:47  _____________________________________________________________________  External Attachment:    Type:   Image     Comment:   External Document

## 2010-03-30 ENCOUNTER — Encounter: Payer: Self-pay | Admitting: Cardiology

## 2010-03-30 ENCOUNTER — Encounter (INDEPENDENT_AMBULATORY_CARE_PROVIDER_SITE_OTHER): Payer: Medicare Other

## 2010-03-30 DIAGNOSIS — Z7901 Long term (current) use of anticoagulants: Secondary | ICD-10-CM

## 2010-03-30 DIAGNOSIS — I4891 Unspecified atrial fibrillation: Secondary | ICD-10-CM

## 2010-03-30 LAB — CONVERTED CEMR LAB: POC INR: 2.2

## 2010-04-07 NOTE — Medication Information (Signed)
Summary: ccr-lr [place]  Lab Visit  Orders Today:  Anticoagulant Therapy  Managed by: Vashti Hey, RN PCP: Dr. Carylon Perches Supervising MD: Dietrich Pates MD, Molly Maduro Indication 1: Atrial Fibrillation Lab Used: [facility] [place] Site: [place] INR POC 2.2  Dietary changes: no    Health status changes: no    Bleeding/hemorrhagic complications: no    Recent/future hospitalizations: no    Any changes in medication regimen? no    Recent/future dental: no  Any missed doses?: no       Is patient compliant with meds? yes         Anticoagulation Management History:      The patient is taking warfarin and comes in today for a routine follow up visit.  Positive risk factors for bleeding include an age of 75 years or older.  The bleeding index is 'intermediate risk'.  Positive CHADS2 values include History of HTN.  Negative CHADS2 values include Age > 41 years old.  The start date was 10/01/2008.  Her last INR was 0.9.  Anticoagulation responsible provider: Dietrich Pates MD, Molly Maduro.  INR POC: 2.2.  Cuvette Lot#: [id number].  Exp: 10/11.    Anticoagulation Management Assessment/Plan:      The patient's current anticoagulation dose is Warfarin sodium 2 mg tabs: Take 1 tablet daily or as directed by Anticoagualtion Clinic.  The target INR is 2.0-3.0.  The next INR is due 04/27/2010.  Anticoagulation instructions were given to patient.  Results were reviewed/authorized by Vashti Hey, RN.  She was notified by Vashti Hey RN.         Prior Anticoagulation Instructions: INR 2.0 Take 1 tablet tomorrow then resume 1 tablet once daily except 1/2 tablets on Mondays and Thursdays  Current Anticoagulation Instructions: INR 2.2 Continue coumadin 2mg  once daily except 1mg  on Mondays and Thursdays

## 2010-04-18 ENCOUNTER — Other Ambulatory Visit (HOSPITAL_COMMUNITY): Payer: Self-pay | Admitting: Internal Medicine

## 2010-04-21 ENCOUNTER — Ambulatory Visit (HOSPITAL_COMMUNITY)
Admission: RE | Admit: 2010-04-21 | Discharge: 2010-04-21 | Disposition: A | Discharge status: Home / Self Care | Payer: Medicare Other | Source: Ambulatory Visit | Attending: Internal Medicine | Admitting: Internal Medicine

## 2010-04-21 DIAGNOSIS — M949 Disorder of cartilage, unspecified: Secondary | ICD-10-CM | POA: Insufficient documentation

## 2010-04-21 DIAGNOSIS — M899 Disorder of bone, unspecified: Secondary | ICD-10-CM | POA: Insufficient documentation

## 2010-05-04 ENCOUNTER — Encounter: Payer: Self-pay | Admitting: Cardiology

## 2010-05-04 ENCOUNTER — Encounter (INDEPENDENT_AMBULATORY_CARE_PROVIDER_SITE_OTHER): Payer: Medicare Other

## 2010-05-04 DIAGNOSIS — Z7901 Long term (current) use of anticoagulants: Secondary | ICD-10-CM

## 2010-05-04 DIAGNOSIS — I4891 Unspecified atrial fibrillation: Secondary | ICD-10-CM

## 2010-05-10 NOTE — Medication Information (Signed)
Summary: ccr-lr  Anticoagulant Therapy  Managed by: Vashti Hey, RN PCP: Dr. Carylon Perches Supervising MD: Daleen Squibb MD, Maisie Fus Indication 1: Atrial Fibrillation Lab Used: [facility] [place] Site: [place] INR POC 2.4  Dietary changes: no    Health status changes: no    Bleeding/hemorrhagic complications: no    Recent/future hospitalizations: no    Any changes in medication regimen? no    Recent/future dental: no  Any missed doses?: no       Is patient compliant with meds? yes       Allergies: No Known Drug Allergies  Anticoagulation Management History:      The patient is taking warfarin and comes in today for a routine follow up visit.  Positive risk factors for bleeding include an age of 75 years or older.  The bleeding index is 'intermediate risk'.  Positive CHADS2 values include History of HTN.  Negative CHADS2 values include Age > 75 years old years old.  The start date was 10/01/2008.  Her last INR was 0.9.  Anticoagulation responsible provider: Daleen Squibb MD, Maisie Fus.  INR POC: 2.4.  Cuvette Lot#: [id number].  Exp: 10/11.    Anticoagulation Management Assessment/Plan:      The patient's current anticoagulation dose is Warfarin sodium 2 mg tabs: Take 1 tablet daily or as directed by Anticoagualtion Clinic.  The target INR is 2.0-3.0.  The next INR is due 06/01/2010.  Anticoagulation instructions were given to patient.  Results were reviewed/authorized by Vashti Hey, RN.  She was notified by Vashti Hey RN.         Prior Anticoagulation Instructions: INR 2.2 Continue coumadin 2mg  once daily except 1mg  on Mondays and Thursdays   Current Anticoagulation Instructions: INR 2.4 Continue coumadin 2mg  once daily except 1mg  on Mondays and Thursdays

## 2010-05-23 LAB — CBC
Hemoglobin: 12.1 g/dL (ref 12.0–15.0)
MCHC: 33.4 g/dL (ref 30.0–36.0)
MCHC: 33.6 g/dL (ref 30.0–36.0)
MCV: 88.7 fL (ref 78.0–100.0)
Platelets: 160 10*3/uL (ref 150–400)
RBC: 4.11 MIL/uL (ref 3.87–5.11)
RDW: 14.3 % (ref 11.5–15.5)
RDW: 14.5 % (ref 11.5–15.5)

## 2010-05-23 LAB — DIFFERENTIAL
Basophils Absolute: 0.1 10*3/uL (ref 0.0–0.1)
Basophils Absolute: 0.1 10*3/uL (ref 0.0–0.1)
Basophils Relative: 1 % (ref 0–1)
Basophils Relative: 1 % (ref 0–1)
Eosinophils Absolute: 0.2 10*3/uL (ref 0.0–0.7)
Eosinophils Relative: 2 % (ref 0–5)
Lymphocytes Relative: 36 % (ref 12–46)
Monocytes Absolute: 0.6 10*3/uL (ref 0.1–1.0)
Monocytes Absolute: 0.9 10*3/uL (ref 0.1–1.0)
Monocytes Relative: 8 % (ref 3–12)
Neutro Abs: 4.9 10*3/uL (ref 1.7–7.7)
Neutrophils Relative %: 54 % (ref 43–77)

## 2010-05-23 LAB — CLOSTRIDIUM DIFFICILE EIA: C difficile Toxins A+B, EIA: NEGATIVE

## 2010-05-23 LAB — PROTIME-INR
INR: 2.01 — ABNORMAL HIGH (ref 0.00–1.49)
Prothrombin Time: 22.6 seconds — ABNORMAL HIGH (ref 11.6–15.2)

## 2010-05-23 LAB — HEMOGLOBIN AND HEMATOCRIT, BLOOD: Hemoglobin: 12.3 g/dL (ref 12.0–15.0)

## 2010-05-24 LAB — DIFFERENTIAL
Eosinophils Relative: 0 % (ref 0–5)
Lymphocytes Relative: 22 % (ref 12–46)
Lymphs Abs: 3.5 10*3/uL (ref 0.7–4.0)
Monocytes Absolute: 1.2 10*3/uL — ABNORMAL HIGH (ref 0.1–1.0)

## 2010-05-24 LAB — COMPREHENSIVE METABOLIC PANEL
ALT: 26 U/L (ref 0–35)
AST: 31 U/L (ref 0–37)
Albumin: 3.6 g/dL (ref 3.5–5.2)
Calcium: 9.3 mg/dL (ref 8.4–10.5)
Chloride: 104 mEq/L (ref 96–112)
Creatinine, Ser: 0.92 mg/dL (ref 0.4–1.2)
GFR calc Af Amer: >60 mL/min (ref >60)
Sodium: 139 mEq/L (ref 135–145)
Total Bilirubin: 0.7 mg/dL (ref 0.3–1.2)

## 2010-05-24 LAB — CBC
MCV: 87.9 fL (ref 78.0–100.0)
Platelets: 202 10*3/uL (ref 150–400)
WBC: 15.9 10*3/uL — ABNORMAL HIGH (ref 4.0–10.5)

## 2010-05-24 LAB — STOOL CULTURE

## 2010-05-24 LAB — OVA AND PARASITE EXAMINATION

## 2010-05-31 ENCOUNTER — Encounter: Payer: Self-pay | Admitting: Cardiology

## 2010-05-31 DIAGNOSIS — I4891 Unspecified atrial fibrillation: Secondary | ICD-10-CM

## 2010-05-31 DIAGNOSIS — Z7901 Long term (current) use of anticoagulants: Secondary | ICD-10-CM | POA: Insufficient documentation

## 2010-06-01 ENCOUNTER — Ambulatory Visit (INDEPENDENT_AMBULATORY_CARE_PROVIDER_SITE_OTHER): Payer: Medicare Other | Admitting: *Deleted

## 2010-06-01 DIAGNOSIS — Z7901 Long term (current) use of anticoagulants: Secondary | ICD-10-CM

## 2010-06-01 DIAGNOSIS — I4891 Unspecified atrial fibrillation: Secondary | ICD-10-CM

## 2010-06-07 ENCOUNTER — Other Ambulatory Visit (HOSPITAL_COMMUNITY): Payer: Self-pay | Admitting: Internal Medicine

## 2010-06-07 DIAGNOSIS — Z1231 Encounter for screening mammogram for malignant neoplasm of breast: Secondary | ICD-10-CM

## 2010-06-29 ENCOUNTER — Ambulatory Visit (INDEPENDENT_AMBULATORY_CARE_PROVIDER_SITE_OTHER): Payer: Medicare Other | Admitting: *Deleted

## 2010-06-29 DIAGNOSIS — I4891 Unspecified atrial fibrillation: Secondary | ICD-10-CM

## 2010-06-29 DIAGNOSIS — Z7901 Long term (current) use of anticoagulants: Secondary | ICD-10-CM

## 2010-07-05 NOTE — Letter (Signed)
September 16, 2008    Kingsley Callander. Ouida Sills, MD  [address]  [place], [place] [address]   RE:  Alison, Kubicki [facility]  MRN:  [id number]  /  DOB:  1933/08/12   Dr. Channing Mutters,   Ms. Carolan returns to the office for continued assessment and treatment  of atrial fibrillation.  Since her last visit, she remains asymptomatic.  Her chest x-ray was essentially unremarkable - there was mild coarsening  of the airway markings.  She underwent a stress nuclear study with  impaired exercise capacity, a borderline electrocardiographic response  and equivocal images with a questionable area of ischemia in the septum  versus variable breast attenuation.  Overall, the study was low risk.  Stool for hemoccult testing was negative.   MEDICATIONS:  Unchanged from her last visit.   PHYSICAL EXAMINATION:  GENERAL:  Pleasant woman in no acute distress.  VITAL SIGNS:  The weight is 177, unchanged, blood pressure 110/63, heart  rate 90 and irregular, respirations 12 and unlabored.  NECK:  No jugular  venous distention; no carotid bruits.  LUNGS:  Clear.  CARDIAC:  Normal first and second heart sounds; scratchy early systolic  ejection murmur.  ABDOMEN:  Soft and nontender.  EXTREMITIES:  No edema.   IMPRESSION:  Ms. Losier is doing well with persistent if not chronic  atrial fibrillation.  I do not believe that DC cardioversion would  likely provide her with any lasting benefit.  She is an intermediate  candidate for anticoagulation with no risk factors other than age.  We  will proceed with a transesophageal echocardiogram for better assessment  of cardiac anatomy and function,  and to rule out other high risk features for thromboembolism.  The risks  and benefits of this procedure were explained to her including  esophageal perforation and complications of sedation.  She agrees to  proceed.  I will let you know whether I recommend anticoagulation after  that study has been completed.    Sincerely,      Gerrit Friends.  Dietrich Pates, MD, [facility]  Electronically Signed    RMR/MedQ  DD: 09/16/2008  DT: 09/17/2008  Job #: [id number]

## 2010-07-05 NOTE — Op Note (Signed)
NAME:  Anne Wolf, Anne Wolf                  ACCOUNT NO.:  [id number]   MEDICAL RECORD NO.:  [id number]          PATIENT TYPE:  INP   LOCATION:  A334                          FACILITY:  [facility]   PHYSICIAN:  R. Roetta Sessions, M.D. DATE OF BIRTH:  [birth year]   DATE OF PROCEDURE:  04/21/2008  DATE OF DISCHARGE:                               OPERATIVE REPORT   Please note this is a redictation on this patient's colonoscopy  performed on April 21, 2008.  I reviewed the medical record.  This lady  is now admitted to the hospital with a new problem.  It appears that the  findings under the original dictation belonged to a unknown patient and  was dictated by an unknown Chandelle Harkey.   I discussed the  situation with medical records and the consensus is  that  it would be best to simply redictate [name]. Crotwell's procedure  note from May 04, 2008.   INDICATIONS FOR PROCEDURE:  A 77 year old lady with a history of colonic  polyps. Her only current GI symptoms are that of occasional  constipation.  Colonoscopy is being done as a surveillance maneuver.  The risks, benefits and limitations have been reviewed, questions  answered.   PROCEDURE NOTE:  O2 saturation, blood pressure, pulse, and respirations  were monitored throughout the entire procedure.  Conscious sedation with  Versed 4 mg IV and Demerol 50 mg IV in divided doses.   INSTRUMENT:  Pentax video chip system.   FINDINGS:  Digital rectal exam revealed no abnormalities.  The prep was  adequate.  Colon:  Colonic mucosa surveyed from the rectosigmoid  junction through the left, transverse, and right colon to the  appendiceal orifice, ileocecal valve and cecum.  These structures were  well seen and photographed for the record.  From this level, the scope  was slowly withdrawn.  Previously mentioned mucosal surfaces were again  seen.  The patient was noted to have left-sided diverticula.  The  remainder of the colonic mucosa appeared normal.  Scope  was pulled down  in the rectum where the rectal mucosa including retroflexed view of the  anal verge demonstrated no abnormalities.  The patient tolerated the  procedure well as reactive endoscopy.   IMPRESSION:  1. Normal rectum.  2. Left-sided diverticula.  Colonic mucosa appeared normal.   RECOMMENDATIONS:  1. Diverticulosis, literature provided to Ms. Frontera.  2. Recommend repeat screening colonoscopy IN 5 years.      Jonathon Bellows, M.D.  Electronically Signed     RMR/MEDQ  D:  02/04/2009  T:  02/04/2009  Job:  [id number]   cc:   Kingsley Callander. Ouida Sills, MD  Fax: [phone]

## 2010-07-05 NOTE — Letter (Signed)
August 13, 2008    Kingsley Callander. Ouida Sills, MD  [address]  [place], [place] [address]   RE:  Anne Wolf, Anne Wolf [facility]  MRN:  [id number]  /  DOB:  Oct 14, 1933   Dear Channing Mutters:   It was my pleasure evaluating Ms. Fatula in the office today in  consultation at your request for newly diagnosed atrial fibrillation.  As you know, this nice woman has enjoyed generally excellent health.  She has no risk factors for AF.  Specifically, she denies hypertension,  heart disease, previous assessment by a cardiologist or thyroid disease.  There is no history of DVT nor pulmonary embolism.  In fact, she has  never been hospitalized.  She has never undergone surgery.  She has been  found to have a hyperlipidemia for which she is treated  pharmacologically.  She also has fasting hyperglycemia and is receiving  an ACE inhibitor for that problem.  She has osteoporosis or osteopenia  for which she is treated as well.   ALLERGIES:  She has no known allergies.   CURRENT MEDICATIONS:  1. Aspirin 81 mg daily.  2. Simvastatin 40 mg daily.  3. Metoprolol 25 mg daily.  4. Lisinopril 10 mg daily.  5. Coenzyme Q.  6. Calcium and vitamin D.  7. Fish oil.  8. Multivitamin.  9. Aleve p.r.n.  10.Alendronate 70 mg every week.   SOCIAL HISTORY:  Retired; married with 2 adult children; no use of  tobacco products or alcohol.   FAMILY HISTORY:  Father died with neoplastic disease.  Mother is alive  at age 60 and has a pacemaker.  Six siblings are alive.  One of her  sisters has a history of atrial fibrillation.   REVIEW OF SYSTEMS:  Notable for the need for corrective lenses, prior  cataract surgery, some hearing impairment, partial upper dentures,  arthritic discomfort in the hips, knees, and hands.  All other systems  reviewed and are negative.   PHYSICAL EXAMINATION:  GENERAL:  Pleasant, somewhat overweight woman in  no acute distress.  VITAL SIGNS:  The weight is 177.  Blood pressure 105/75, heart rate 80  and irregular,  respirations 14 and unlabored.  Neck:  No jugular venous distention; normal carotid upstrokes without  bruits.  HEENT:  Full EOMs; normal oral mucosa.  LUNGS:  Clear.  CARDIAC:  Normal first and second heart sounds; irregular rhythm;  minimal systolic ejection murmur.  ABDOMEN:  Soft and nontender; no organomegaly.  EXTREMITIES:  No edema; normal distal pulses.  NEUROLOGIC:  Symmetric strength and tone; normal cranial nerves.  PSYCHIATRIC:  Normal affect.  ENDOCRINE:  Mild thyromegaly.  HEMATOPOIETIC:  No adenopathy.   EKG:  Atrial fibrillation with controlled ventricular response; delayed  R-wave progression; right ventricular conduction delay; left axis -  possible left anterior fascicular block.  No tracing for comparison.   Laboratory obtained from your office and reviewed.  Chemistry profile  was normal except for a glucose of 123.  Hemoglobin A1c was 6.1.  However, micro albumin was increased.  Lipid profile was fairly good  with total cholesterol of 182, triglycerides of 127, HDL of 49, and LDL  of 108.  TSH was normal.   IMPRESSION:  Ms. Tallo presents with newly noted atrial fibrillation.  She reports an EKG some months ago showed sinus rhythm.  Accordingly,  her arrhythmias are relatively recent onset.  Her echocardiogram shows  both aortic and mitral valve disease, neither of which is critical.  Although the findings were not  diagnostic, she may well have rheumatic  heart disease.  She had very mild aortic stenosis and mild mitral  regurgitation.  The left atrium was mildly to moderately dilated.  In  the setting of valvular heart disease, albeit probably not  hemodynamically important, I think it unlikely that we can maintain  sinus rhythm without antiarrhythmics.  The risk of antiarrhythmics  appears to outweigh the benefit, which is speculative, since she is  asymptomatic in atrial fibrillation.  She does report some chronic  dyspnea on exertion, which predated her  atrial fibrillation.  We will  proceed with a stress nuclear study to rule out coronary artery disease.   She is a borderline candidate for chronic anticoagulation.  Her only  risk factor is her age, which is intermediate between age 34, where  there is some increased risk of thromboembolism to age 15 where the risk  becomes more significant.  I would be inclined to proceed with  transesophageal echocardiogram at some point to further define her  thromboembolic risk and to more closely evaluate her valves.  I think it  likely that she will not require chronic Coumadin therapy.  I will keep  you informed as her workup and treatment proceed.  Thank so much for  sending her to see me.    Sincerely,      Gerrit Friends. Dietrich Pates, MD, [facility]  Electronically Signed    RMR/MedQ  DD: 08/13/2008  DT: 08/14/2008  Job #: [id number]

## 2010-07-05 NOTE — Op Note (Signed)
NAME:  Anne Wolf, Anne Wolf                  ACCOUNT NO.:  [id number]   MEDICAL RECORD NO.:  [id number]          PATIENT TYPE:  AMB   LOCATION:  DAY                           FACILITY:  [facility]   PHYSICIAN:  R. Roetta Sessions, M.D. DATE OF BIRTH:  1933/11/30   DATE OF PROCEDURE:  05/04/2008  DATE OF DISCHARGE:                               OPERATIVE REPORT   PROCEDURE:  Colonoscopy.   INDICATIONS:  Screening for colon cancer.   POSTOPERATIVE DIAGNOSIS:  Mild diverticulosis.   DESCRIPTION OF PROCEDURE:  After obtaining informed consent, the patient  was placed in left lateral decubitus position and premedicated with  fentanyl and Versed.  The scope was advanced through the rectum and  through the descending colon, splenic flexure, transverse colon, hepatic  flexure, ascending colon, and cecum.  Landmarks were verified and  photographed.  The scope was withdrawn from cecum through the ascending  colon, hepatic flexure, transverse, splenic flexure, descending, into  the rectum.  There appeared to be an anastomotic area around 10-12 cm,  but this area appeared normal.  A few scattered left-sided tics.   IMPRESSION:  Mild diverticulosis.  Recommend fiber and repeat procedure  in 5 years.      Jonathon Bellows, M.D.  Electronically Signed     RMR/MEDQ  D:  05/04/2008  T:  05/04/2008  Job:  [id number]   cc:   Kingsley Callander. Ouida Sills, MD  Fax: [phone]

## 2010-07-05 NOTE — Consult Note (Signed)
NAME:  Anne Wolf, Anne Wolf                  ACCOUNT NO.:  [id number]   MEDICAL RECORD NO.:  [id number]           PATIENT TYPE:  AMB   LOCATION:  DAY                           FACILITY:  [facility]   PHYSICIAN:  R. Roetta Sessions, M.D. DATE OF BIRTH:  [birth year]   DATE OF CONSULTATION:  04/21/2008  DATE OF DISCHARGE:                                 CONSULTATION   REASON FOR CONSULTATION:  History of polyps, needs colonoscopy.   PHYSICIAN CO-SIGNING NOTE:  Jonathon Bellows, MD   PHYSICIAN REQUESTING CONSULTATION:  Kingsley Callander. Ouida Sills, MD   HISTORY OF PRESENT ILLNESS:  The patient is a very pleasant 77 year old  lady who presents back today for consideration of colonoscopy.  She has  a history of colon polyps.  She also has some constipation.  She  generally has a bowel movement, but she describes a small and hard at  times daily.  She increases her dietary fiber in order to facilitate  bowel movements.  She immediately does not drink enough liquid  throughout the day.  She denies any blood in the stool or melena.  She  has recently hemoccult negative on digital rectal exam by Dr. Ouida Sills.  No  abdominal pain, nausea or vomiting.  She really has heart burn.  Denies  dysphagia, odynophagia or weight loss.   CURRENT MEDICATIONS:  1. Multivitamin daily.  2. Aspirin 81 mg daily.  3. CO Q10 daily.  4. Fish oil daily.  5. Simvastatin daily.  6. Fosamax weekly.  7. Calcium 1200 mg daily.  8. Lisinopril 10 mg daily.   ALLERGIES:  No known drug allergies.   PAST MEDICAL HISTORY:  1. Hypercholesterolemia.  2. Osteoporosis.  3. Impaired fasting glucose.  4. History of colonic polyps, last colonoscopy July 2004, she had an      internal hemorrhoids.  Dr. Jena Gauss recommend she come back in 5 years      because previous colonoscopy in 2001.  She had a 0.75 mm tubular      adenoma sessile polyp in the right colon.   PAST SURGICAL HISTORY:  None.   FAMILY HISTORY:  Mother is 95 years old, history of pacemaker.   Father  deceased at age 54 due to sarcoma.  She has 3 sisters with diabetes, one  has significant renal failure.  No family history of colon cancer.   SOCIAL HISTORY:  She is married.  She has a granddaughter who has  history of Hurler's disease and underwent a stem cell transplant  recently.  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  She  has never been a smoker.  No alcohol use.   REVIEW OF SYSTEMS:  See HPI for GI.  CONSTITUTIONAL:  No weight loss.  CARDIOPULMONARY:  No chest pain, shortness of breath, palpitations or  cough.  GENITOURINARY:  No dysuria or hematuria.   PHYSICAL EXAMINATION:  VITAL SIGNS:  Weight 178, height 5 foot 3 inches,  temperature 98, blood pressure 120/70, and pulse 72.  GENERAL:  Pleasant, well-nourished, well-developed Caucasian female in  no acute distress.  SKIN:  Warm and dry.  No jaundice.  HEENT:  Sclerae nonicteric.  Oropharyngeal mucosa moist and pink.  No  lesions, erythema, or exudate.  No lymphadenopathy or thyromegaly.  CHEST:  Lungs are clear to auscultation.  CARDIAC:  Regular rate and rhythm.  No murmurs, rubs, or gallops.  ABDOMEN:  Positive bowel sounds.  Abdomen is soft, nontender, and  nondistended.  No organomegaly or masses.  No rebound or guarding.  No  abdominal bruits or hernias.  LOWER EXTREMITIES:  No edema.   LABORATORY DATA:  CBC normal.  LFTs normal.  Hemoglobin A1c 6.1 and  microalbumin 8.9.   IMPRESSION:  The patient is a very pleasant 77 year old lady with  history of tubular adenoma as outlined above.  Last colonoscopy 6 years  ago.  Recommended to have surveillance colonoscopy at this time.  Based  on findings, recommendations for future colonoscopies will be made based  on new guidelines.   PLAN:  1. Colonoscopy with Dr. Jena Gauss in the near future.  We will hold her      aspirin 4 days prior to procedure.  2. Further recommendations to follow.  3. I would like to thank Dr. Carylon Perches for allowing [place] to  take part in      the care of this patient.      Tana Coast, P.AJonathon Bellows, M.D.  Electronically Signed    LL/MEDQ  D:  04/21/2008  T:  04/22/2008  Job:  [id number]   cc:   Kingsley Callander. Ouida Sills, MD  Fax: [phone]

## 2010-07-08 NOTE — Op Note (Signed)
   NAME:  Anne Wolf, Anne Wolf                        ACCOUNT NO.:  [id number]   MEDICAL RECORD NO.:  [id number]                   PATIENT TYPE:  AMB   LOCATION:  DAY                                  FACILITY:  [facility]   PHYSICIAN:  R. Roetta Sessions, M.D.              DATE OF BIRTH:  13-Dec-1933   DATE OF PROCEDURE:  09/19/2002  DATE OF DISCHARGE:                                 OPERATIVE REPORT   PROCEDURE:  Surveillance colonoscopy.   INDICATIONS FOR PROCEDURE:  The patient is a 77 year old lady with a history  of right colon adenoma removed in 2001 here for surveillance.  She has done  well.  She has no current GI symptoms or other problems.  Colonoscopy is now  being done as a surveillance  maneuver.  This approach has been discussed  with the patient at the bedside.  The potential risks, benefits, and  alternatives have been reviewed.  Please see my handwritten H&P for more  information.   PROCEDURE:  O2 saturation, blood pressure, pulses, and respirations were  monitored throughout the entire procedure.  Conscious sedation was with  Versed 2 mg IV, Demerol 50 mg IV in divided doses.  The instrument used was  the Olympus video chip adult colonoscope.   FINDINGS:  Digital rectal examination revealed no abnormalities.   ENDOSCOPIC FINDINGS:  The prep was good.   Rectum:  Examination of the rectal mucosa including retroflex view of the  anal verge revealed only some internal hemorrhoids.   Colon:  The colonic mucosa was surveyed from the rectosigmoid junction  through the left, transverse, right colon to the area of the appendiceal  orifice, ileocecal valve, and cecum.  These structures were well-seen and  photographed for the record.  The colonic mucosa all the way to the cecum  appeared normal.  From the level of the cecum and ileocecal valve, the scope  was slowly withdrawn.  All previously mentioned mucosal surfaces were again  seen, and again, no other abnormalities were  observed.  The patient  tolerated the procedure well and was reactive in endoscopy.    IMPRESSION:  1. Internal hemorrhoids.  Otherwise normal rectum.  2. Normal colon.   RECOMMENDATIONS:  Repeat colonoscopy in five years.                                               Jonathon Bellows, M.D.    RMR/MEDQ  D:  09/19/2002  T:  09/19/2002  Job:  [id number]   cc:   Kingsley Callander. Ouida Sills, M.D.  [address]  [place]  [place] [address]  Fax: [phone]

## 2010-07-11 ENCOUNTER — Ambulatory Visit (HOSPITAL_COMMUNITY): Payer: Medicare Other

## 2010-07-11 ENCOUNTER — Ambulatory Visit
Admission: RE | Admit: 2010-07-11 | Discharge: 2010-07-11 | Disposition: A | Discharge status: Home / Self Care | Payer: Medicare Other | Source: Ambulatory Visit | Attending: Internal Medicine | Admitting: Internal Medicine

## 2010-07-11 DIAGNOSIS — Z1231 Encounter for screening mammogram for malignant neoplasm of breast: Secondary | ICD-10-CM

## 2010-07-28 ENCOUNTER — Ambulatory Visit (INDEPENDENT_AMBULATORY_CARE_PROVIDER_SITE_OTHER): Payer: Medicare Other | Admitting: *Deleted

## 2010-07-28 DIAGNOSIS — Z7901 Long term (current) use of anticoagulants: Secondary | ICD-10-CM

## 2010-07-28 DIAGNOSIS — I4891 Unspecified atrial fibrillation: Secondary | ICD-10-CM

## 2010-08-16 ENCOUNTER — Other Ambulatory Visit: Payer: Self-pay | Admitting: Cardiology

## 2010-08-25 ENCOUNTER — Ambulatory Visit (INDEPENDENT_AMBULATORY_CARE_PROVIDER_SITE_OTHER): Payer: Medicare Other | Admitting: *Deleted

## 2010-08-25 DIAGNOSIS — I4891 Unspecified atrial fibrillation: Secondary | ICD-10-CM

## 2010-08-25 DIAGNOSIS — Z7901 Long term (current) use of anticoagulants: Secondary | ICD-10-CM

## 2010-08-25 LAB — POCT INR: INR: 2.5

## 2010-09-22 ENCOUNTER — Ambulatory Visit (INDEPENDENT_AMBULATORY_CARE_PROVIDER_SITE_OTHER): Payer: Medicare Other | Admitting: *Deleted

## 2010-09-22 DIAGNOSIS — I4891 Unspecified atrial fibrillation: Secondary | ICD-10-CM

## 2010-09-22 DIAGNOSIS — Z7901 Long term (current) use of anticoagulants: Secondary | ICD-10-CM

## 2010-09-22 LAB — POCT INR: INR: 2

## 2010-09-29 ENCOUNTER — Encounter: Payer: Medicare Other | Admitting: *Deleted

## 2010-10-20 ENCOUNTER — Ambulatory Visit (INDEPENDENT_AMBULATORY_CARE_PROVIDER_SITE_OTHER): Payer: Medicare Other | Admitting: *Deleted

## 2010-10-20 DIAGNOSIS — Z7901 Long term (current) use of anticoagulants: Secondary | ICD-10-CM

## 2010-10-20 DIAGNOSIS — I4891 Unspecified atrial fibrillation: Secondary | ICD-10-CM

## 2010-10-20 LAB — POCT INR: INR: 2.6

## 2010-11-17 ENCOUNTER — Encounter: Payer: Medicare Other | Admitting: *Deleted

## 2010-11-17 ENCOUNTER — Ambulatory Visit (INDEPENDENT_AMBULATORY_CARE_PROVIDER_SITE_OTHER): Payer: Medicare Other | Admitting: *Deleted

## 2010-11-17 DIAGNOSIS — Z7901 Long term (current) use of anticoagulants: Secondary | ICD-10-CM

## 2010-11-17 DIAGNOSIS — I4891 Unspecified atrial fibrillation: Secondary | ICD-10-CM

## 2010-11-17 LAB — POCT INR: INR: 2

## 2010-12-22 ENCOUNTER — Ambulatory Visit (INDEPENDENT_AMBULATORY_CARE_PROVIDER_SITE_OTHER): Payer: Medicare Other | Admitting: *Deleted

## 2010-12-22 DIAGNOSIS — I4891 Unspecified atrial fibrillation: Secondary | ICD-10-CM

## 2010-12-22 DIAGNOSIS — Z7901 Long term (current) use of anticoagulants: Secondary | ICD-10-CM

## 2010-12-22 LAB — POCT INR: INR: 2.2

## 2011-01-26 ENCOUNTER — Encounter: Payer: Self-pay | Admitting: Cardiology

## 2011-02-02 ENCOUNTER — Encounter: Payer: Medicare Other | Admitting: *Deleted

## 2011-02-06 ENCOUNTER — Encounter: Payer: Self-pay | Admitting: Cardiology

## 2011-02-06 ENCOUNTER — Ambulatory Visit (INDEPENDENT_AMBULATORY_CARE_PROVIDER_SITE_OTHER): Payer: Medicare Other | Admitting: *Deleted

## 2011-02-06 ENCOUNTER — Ambulatory Visit (INDEPENDENT_AMBULATORY_CARE_PROVIDER_SITE_OTHER): Payer: Medicare Other | Admitting: Cardiology

## 2011-02-06 DIAGNOSIS — I350 Nonrheumatic aortic (valve) stenosis: Secondary | ICD-10-CM

## 2011-02-06 DIAGNOSIS — R809 Proteinuria, unspecified: Secondary | ICD-10-CM

## 2011-02-06 DIAGNOSIS — Z86010 Personal history of colonic polyps: Secondary | ICD-10-CM

## 2011-02-06 DIAGNOSIS — R7301 Impaired fasting glucose: Secondary | ICD-10-CM | POA: Insufficient documentation

## 2011-02-06 DIAGNOSIS — E785 Hyperlipidemia, unspecified: Secondary | ICD-10-CM

## 2011-02-06 DIAGNOSIS — I4891 Unspecified atrial fibrillation: Secondary | ICD-10-CM

## 2011-02-06 DIAGNOSIS — K921 Melena: Secondary | ICD-10-CM

## 2011-02-06 DIAGNOSIS — Z7901 Long term (current) use of anticoagulants: Secondary | ICD-10-CM

## 2011-02-06 DIAGNOSIS — R7309 Other abnormal glucose: Secondary | ICD-10-CM

## 2011-02-06 LAB — POCT INR: INR: 2.3

## 2011-02-06 NOTE — Assessment & Plan Note (Signed)
Recent hemoglobin A1c level was near normal indicating that pharmacologic therapy is not necessary.

## 2011-02-06 NOTE — Assessment & Plan Note (Signed)
Normal CBC and stool for Hemoccult testing within the past 12 months would tend to exclude significant GI blood loss.  We will continue to adjust warfarin dosing and to monitor for adverse effects.

## 2011-02-06 NOTE — Progress Notes (Signed)
Patient ID: Anne Wolf, female   DOB: 1934-01-31, 77 y.o.   MRN: [id number] HPI: Return visit for this delightful woman with generally excellent health but with long-standing atrial fibrillation requiring chronic anticoagulation.  Since I last saw her nearly one year ago, she has done quite well.  She is active including caring for an 62 month old grandchild.  She denies exertional dyspnea, chest discomfort, palpitations, lightheadedness or syncope.  She has had no pedal edema.  She has developed no new medical problems nor has she been seen in the emergency department or hospitalized.  Prior to Admission medications   Medication Sig Start Date End Date Taking? Authorizing Provider  alendronate (FOSAMAX) 70 MG tablet Take 70 mg by mouth every 7 (seven) days. Take with a full glass of water on an empty stomach.    Yes Historical Provider, MD  Co-Enzyme Q-10 100 MG CAPS Take 1 capsule by mouth daily.     Yes Historical Provider, MD  fish oil-omega-3 fatty acids 1000 MG capsule Take 1 capsule by mouth daily.     Yes Historical Provider, MD  lisinopril (PRINIVIL,ZESTRIL) 10 MG tablet Take 10 mg by mouth daily.     Yes Historical Provider, MD  metoprolol succinate (TOPROL-XL) 25 MG 24 hr tablet Take 25 mg by mouth daily.     Yes Historical Provider, MD  Multiple Vitamins-Minerals (CENTRUM SILVER) tablet Take 1 tablet by mouth daily.     Yes Historical Provider, MD  simvastatin (ZOCOR) 40 MG tablet Take 40 mg by mouth at bedtime.     Yes Historical Provider, MD  warfarin (COUMADIN) 2 MG tablet TAKE 1 TABLET BY MOUTH DAILY OR AS DIRECTED BY ANTICOAGULATION CLINIC 08/16/10  Yes Gerrit Friends. Dietrich Pates, MD  No Known Allergies    Past medical history, social history, and family history reviewed and updated.  ROS: See history of present illness.  PHYSICAL EXAM: BP 129/74  Pulse 81  Resp 16  Ht 5\' 3"  (1.6 m)  Wt 83.915 kg (185 lb)  BMI 32.77 kg/m2  General-Well developed; no acute distress Body  habitus-overweight Neck-No JVD; no carotid bruits Lungs-clear lung fields; resonant to percussion Cardiovascular-normal PMI; normal S1 and S2; irregular rhythm Abdomen-normal bowel sounds; soft and non-tender without masses or organomegaly Musculoskeletal-No deformities, no cyanosis or clubbing Neurologic-Normal cranial nerves; symmetric strength and tone Skin-Warm, no significant lesions Extremities-distal pulses intact; no edema  EKG: Atrial fibrillation with a controlled ventricular response; heart rate of 84 bpm; left anterior fascicular block; delayed R-wave progression.  When compared with a previous tracing performed 08/13/08, there has been no significant interval change.  ASSESSMENT AND PLAN:  Leonard Bing, MD 02/06/2011 3:38 PM

## 2011-02-06 NOTE — Assessment & Plan Note (Signed)
Patient is asymptomatic with respect atrial fibrillation.  Control of heart rate with only 25 mg of metoprolol suggests a component of sick sinus syndrome as does her left anterior fascicular block.  Current therapy is appropriate and will be continued.

## 2011-02-06 NOTE — Assessment & Plan Note (Signed)
Most recent lipid profile available to me from 2010 showed somewhat suboptimal control of hyperlipidemia, but patient has no known vascular disease and no apparent clinical manifestations of her lipid disorder.  Therapy with moderate dose simvastatin should represent adequate treatment for her.

## 2011-02-06 NOTE — Patient Instructions (Signed)
Your physician recommends that you schedule a follow-up appointment in: 12 months.  

## 2011-02-06 NOTE — Assessment & Plan Note (Signed)
Patient receiving appropriate treatment with an ACE inhibitor.

## 2011-02-08 ENCOUNTER — Encounter: Payer: Self-pay | Admitting: Cardiology

## 2011-03-09 ENCOUNTER — Ambulatory Visit (INDEPENDENT_AMBULATORY_CARE_PROVIDER_SITE_OTHER): Payer: Medicare Other | Admitting: *Deleted

## 2011-03-09 DIAGNOSIS — Z7901 Long term (current) use of anticoagulants: Secondary | ICD-10-CM

## 2011-03-09 DIAGNOSIS — I4891 Unspecified atrial fibrillation: Secondary | ICD-10-CM

## 2011-04-24 ENCOUNTER — Ambulatory Visit: Payer: Self-pay | Admitting: *Deleted

## 2011-04-24 DIAGNOSIS — I4891 Unspecified atrial fibrillation: Secondary | ICD-10-CM

## 2011-04-24 DIAGNOSIS — Z7901 Long term (current) use of anticoagulants: Secondary | ICD-10-CM

## 2011-06-10 ENCOUNTER — Other Ambulatory Visit: Payer: Self-pay | Admitting: Cardiology

## 2011-06-14 ENCOUNTER — Ambulatory Visit (INDEPENDENT_AMBULATORY_CARE_PROVIDER_SITE_OTHER): Payer: Medicare Other | Admitting: *Deleted

## 2011-06-14 DIAGNOSIS — Z7901 Long term (current) use of anticoagulants: Secondary | ICD-10-CM

## 2011-06-14 DIAGNOSIS — I4891 Unspecified atrial fibrillation: Secondary | ICD-10-CM

## 2011-07-18 ENCOUNTER — Other Ambulatory Visit: Payer: Self-pay | Admitting: Internal Medicine

## 2011-07-18 DIAGNOSIS — Z1231 Encounter for screening mammogram for malignant neoplasm of breast: Secondary | ICD-10-CM

## 2011-07-26 ENCOUNTER — Ambulatory Visit (INDEPENDENT_AMBULATORY_CARE_PROVIDER_SITE_OTHER): Payer: Medicare Other | Admitting: *Deleted

## 2011-07-26 DIAGNOSIS — Z7901 Long term (current) use of anticoagulants: Secondary | ICD-10-CM

## 2011-07-26 DIAGNOSIS — I4891 Unspecified atrial fibrillation: Secondary | ICD-10-CM

## 2011-07-31 ENCOUNTER — Ambulatory Visit
Admission: RE | Admit: 2011-07-31 | Discharge: 2011-07-31 | Disposition: A | Discharge status: Home / Self Care | Payer: Medicare Other | Source: Ambulatory Visit | Attending: Internal Medicine | Admitting: Internal Medicine

## 2011-07-31 DIAGNOSIS — Z1231 Encounter for screening mammogram for malignant neoplasm of breast: Secondary | ICD-10-CM

## 2011-09-06 ENCOUNTER — Ambulatory Visit (INDEPENDENT_AMBULATORY_CARE_PROVIDER_SITE_OTHER): Payer: Medicare Other | Admitting: *Deleted

## 2011-09-06 DIAGNOSIS — I4891 Unspecified atrial fibrillation: Secondary | ICD-10-CM

## 2011-09-06 DIAGNOSIS — Z7901 Long term (current) use of anticoagulants: Secondary | ICD-10-CM

## 2011-09-06 LAB — POCT INR: INR: 2.1

## 2011-10-19 ENCOUNTER — Ambulatory Visit (INDEPENDENT_AMBULATORY_CARE_PROVIDER_SITE_OTHER): Payer: Medicare Other | Admitting: *Deleted

## 2011-10-19 DIAGNOSIS — I4891 Unspecified atrial fibrillation: Secondary | ICD-10-CM

## 2011-10-19 DIAGNOSIS — Z7901 Long term (current) use of anticoagulants: Secondary | ICD-10-CM

## 2011-10-19 LAB — POCT INR: INR: 2.6

## 2011-12-13 ENCOUNTER — Ambulatory Visit (INDEPENDENT_AMBULATORY_CARE_PROVIDER_SITE_OTHER): Payer: Medicare Other | Admitting: *Deleted

## 2011-12-13 DIAGNOSIS — I4891 Unspecified atrial fibrillation: Secondary | ICD-10-CM

## 2011-12-13 DIAGNOSIS — Z7901 Long term (current) use of anticoagulants: Secondary | ICD-10-CM

## 2011-12-13 LAB — POCT INR: INR: 2.5

## 2012-01-29 ENCOUNTER — Ambulatory Visit (INDEPENDENT_AMBULATORY_CARE_PROVIDER_SITE_OTHER): Payer: Medicare Other | Admitting: *Deleted

## 2012-01-29 DIAGNOSIS — I4891 Unspecified atrial fibrillation: Secondary | ICD-10-CM

## 2012-01-29 DIAGNOSIS — Z7901 Long term (current) use of anticoagulants: Secondary | ICD-10-CM

## 2012-01-29 LAB — POCT INR: INR: 2.8

## 2012-03-05 ENCOUNTER — Other Ambulatory Visit: Payer: Self-pay | Admitting: *Deleted

## 2012-03-05 MED ORDER — WARFARIN SODIUM 2 MG PO TABS: 2.0000 mg | ORAL_TABLET | Freq: Every day | ORAL | Status: DC

## 2012-03-11 ENCOUNTER — Ambulatory Visit (INDEPENDENT_AMBULATORY_CARE_PROVIDER_SITE_OTHER): Payer: Medicare Other | Admitting: *Deleted

## 2012-03-11 DIAGNOSIS — Z7901 Long term (current) use of anticoagulants: Secondary | ICD-10-CM

## 2012-03-11 DIAGNOSIS — I4891 Unspecified atrial fibrillation: Secondary | ICD-10-CM

## 2012-03-11 LAB — POCT INR: INR: 2.4

## 2012-04-22 ENCOUNTER — Ambulatory Visit (INDEPENDENT_AMBULATORY_CARE_PROVIDER_SITE_OTHER): Payer: Medicare Other | Admitting: *Deleted

## 2012-04-22 DIAGNOSIS — I4891 Unspecified atrial fibrillation: Secondary | ICD-10-CM

## 2012-04-22 DIAGNOSIS — Z7901 Long term (current) use of anticoagulants: Secondary | ICD-10-CM

## 2012-04-22 LAB — POCT INR: INR: 2.6

## 2012-05-16 ENCOUNTER — Other Ambulatory Visit: Payer: Self-pay | Admitting: *Deleted

## 2012-05-16 MED ORDER — WARFARIN SODIUM 2 MG PO TABS: 2.0000 mg | ORAL_TABLET | Freq: Every day | ORAL | Status: DC

## 2012-05-31 ENCOUNTER — Other Ambulatory Visit: Payer: Self-pay | Admitting: *Deleted

## 2012-05-31 MED ORDER — WARFARIN SODIUM 2 MG PO TABS: 2.0000 mg | ORAL_TABLET | Freq: Every day | ORAL | Status: DC

## 2012-06-03 ENCOUNTER — Ambulatory Visit (INDEPENDENT_AMBULATORY_CARE_PROVIDER_SITE_OTHER): Payer: Medicare Other | Admitting: *Deleted

## 2012-06-03 DIAGNOSIS — Z7901 Long term (current) use of anticoagulants: Secondary | ICD-10-CM

## 2012-06-03 DIAGNOSIS — I4891 Unspecified atrial fibrillation: Secondary | ICD-10-CM

## 2012-06-05 ENCOUNTER — Other Ambulatory Visit: Payer: Self-pay | Admitting: Cardiology

## 2012-06-05 MED ORDER — WARFARIN SODIUM 2 MG PO TABS: 2.0000 mg | ORAL_TABLET | Freq: Every day | ORAL | Status: DC

## 2012-06-05 NOTE — Telephone Encounter (Signed)
Called pt to schedule o.v. W/ Dr. Dietrich Pates. Left message for her to call us back

## 2012-07-11 ENCOUNTER — Ambulatory Visit: Payer: Medicare Other | Admitting: Cardiology

## 2012-07-23 ENCOUNTER — Other Ambulatory Visit: Payer: Self-pay

## 2012-07-23 DIAGNOSIS — Z1231 Encounter for screening mammogram for malignant neoplasm of breast: Secondary | ICD-10-CM

## 2012-07-29 ENCOUNTER — Ambulatory Visit (INDEPENDENT_AMBULATORY_CARE_PROVIDER_SITE_OTHER): Payer: Medicare Other | Admitting: Cardiology

## 2012-07-29 ENCOUNTER — Encounter: Payer: Self-pay | Admitting: Cardiology

## 2012-07-29 ENCOUNTER — Ambulatory Visit (INDEPENDENT_AMBULATORY_CARE_PROVIDER_SITE_OTHER): Payer: Medicare Other | Admitting: *Deleted

## 2012-07-29 VITALS — BP 109/68 | HR 83 | Ht 63.0 in | Wt 187.8 lb

## 2012-07-29 DIAGNOSIS — I350 Nonrheumatic aortic (valve) stenosis: Secondary | ICD-10-CM

## 2012-07-29 DIAGNOSIS — I4891 Unspecified atrial fibrillation: Secondary | ICD-10-CM

## 2012-07-29 DIAGNOSIS — Z7901 Long term (current) use of anticoagulants: Secondary | ICD-10-CM

## 2012-07-29 DIAGNOSIS — I359 Nonrheumatic aortic valve disorder, unspecified: Secondary | ICD-10-CM

## 2012-07-29 DIAGNOSIS — I10 Essential (primary) hypertension: Secondary | ICD-10-CM

## 2012-07-29 LAB — POCT INR: INR: 3.5

## 2012-07-29 NOTE — Assessment & Plan Note (Addendum)
Treatment with warfarin has been well tolerated without apparent chronic blood loss. This will be reassessed by FOBT and review of recent CBC results. Patient will continue to have colonoscopy at five-year intervals and to be  followed by her PCP and gastroenterologist.

## 2012-07-29 NOTE — Patient Instructions (Addendum)
Your physician recommends that you schedule a follow-up appointment in: ONE YEAR  Your Physician recommends that you complete the Hemoccult package given to you today, instructions are enclosed in your packet. Once completed please return to this office.  

## 2012-07-29 NOTE — Assessment & Plan Note (Signed)
Patient has no symptoms attributable to her atrial arrhythmias. Heart rate control appears adequate. Current therapy and strategy of rate control/anticoagulation will be continued.

## 2012-07-29 NOTE — Assessment & Plan Note (Signed)
Aortic stenosis remains mild by physical examination criteria, and is almost certainly asymptomatic. I doubt whether aortic valve disease will become a clinical problem within the next 10 years.

## 2012-07-29 NOTE — Progress Notes (Signed)
Patient ID: Anne Wolf, female   DOB: 1933/09/28, 79 y.o.   MRN: [id number]  HPI: Schedule return visit for continuing treatment of atrial fibrillation. Anne Wolf has no definite cardiac symptoms, but does note dyspnea with moderate exertion. She attributes this to physical inactivity. Lifestyle has been sedentary-she can provide no reason why that is the case.   Current Outpatient Prescriptions  Medication Sig Dispense Refill  . alendronate (FOSAMAX) 70 MG tablet Take 70 mg by mouth every 7 (seven) days. Take with a full glass of water on an empty stomach.       Marland Kitchen Co-Enzyme Q-10 100 MG CAPS Take 1 capsule by mouth daily.        Marland Kitchen lisinopril (PRINIVIL,ZESTRIL) 10 MG tablet Take 10 mg by mouth daily.        . metoprolol succinate (TOPROL-XL) 25 MG 24 hr tablet Take 25 mg by mouth daily.        . Multiple Vitamins-Minerals (CENTRUM SILVER) tablet Take 1 tablet by mouth daily.        . simvastatin (ZOCOR) 40 MG tablet Take 40 mg by mouth at bedtime.        Marland Kitchen warfarin (COUMADIN) 2 MG tablet Take 1 tablet (2 mg total) by mouth daily.  90 tablet  0   No current facility-administered medications for this visit.   No Known Allergies   Past medical history, social history, and family history reviewed and updated.  ROS: She denies palpitations, lightheadedness or syncope. She's had no apparent complications related to chronic anticoagulation. All other systems reviewed and are negative.  PHYSICAL EXAM: BP 109/68  Pulse 83  Ht 5\' 3"  (1.6 m)  Wt 85.163 kg (187 lb 12 oz)  BMI 33.27 kg/m2;  Body mass index is 33.27 kg/(m^2). General-Well developed; no acute distress Body habitus-proportionate weight and height Neck-No JVD; no carotid bruits Lungs-clear lung fields; resonant to percussion Cardiovascular-normal PMI; normal S1 and preserved S2; irregular rhythm; grade 1/6 basilar systolic ejection murmur  Abdomen-normal bowel sounds; soft and non-tender without masses or  organomegaly Musculoskeletal-No deformities, no cyanosis or clubbing Neurologic-Normal cranial nerves; symmetric strength and tone Skin-Warm, no significant lesions Extremities-distal pulses intact; no edema  EKG: Atrial fibrillation with a controlled ventricular response; ventricular rate is 84 bpm; left axis deviation; delayed R-wave progression; decreased voltage; nonspecific T wave abnormality limited to lead aVL.  Anne Bing, MD 07/29/2012  1:13 PM  ASSESSMENT AND PLAN

## 2012-07-29 NOTE — Progress Notes (Deleted)
Name: Anne Wolf    DOB: November 10, 1933  Age: 79 y.o.  MR#: [id number]       PCP:  Carylon Perches, MD      Insurance: Payor: [employer] MEDICARE / Plan: AARP MEDICARE COMPLETE / Product Type: *No Product type* /   CC:   No chief complaint on file.  LIST VS Filed Vitals:   07/29/12 1118  BP: 109/68  Pulse: 83  Height: 5\' 3"  (1.6 m)  Weight: 187 lb 12 oz (85.163 kg)    Weights Current Weight  07/29/12 187 lb 12 oz (85.163 kg)  02/06/11 185 lb (83.915 kg)  02/17/10 178 lb (80.74 kg)    Blood Pressure  BP Readings from Last 3 Encounters:  07/29/12 109/68  02/06/11 129/74  02/17/10 103/67     Admit date:  (Not on file) Last encounter with RMR:  07/11/2012   Allergy Review of patient's allergies indicates no known allergies.  Current Outpatient Prescriptions  Medication Sig Dispense Refill  . alendronate (FOSAMAX) 70 MG tablet Take 70 mg by mouth every 7 (seven) days. Take with a full glass of water on an empty stomach.       Marland Kitchen Co-Enzyme Q-10 100 MG CAPS Take 1 capsule by mouth daily.        Marland Kitchen lisinopril (PRINIVIL,ZESTRIL) 10 MG tablet Take 10 mg by mouth daily.        . metoprolol succinate (TOPROL-XL) 25 MG 24 hr tablet Take 25 mg by mouth daily.        . Multiple Vitamins-Minerals (CENTRUM SILVER) tablet Take 1 tablet by mouth daily.        . simvastatin (ZOCOR) 40 MG tablet Take 40 mg by mouth at bedtime.        Marland Kitchen warfarin (COUMADIN) 2 MG tablet Take 1 tablet (2 mg total) by mouth daily.  90 tablet  0   No current facility-administered medications for this visit.    Discontinued Meds:    Medications Discontinued During This Encounter  Medication Reason  . fish oil-omega-3 fatty acids 1000 MG capsule Error    Patient Active Problem List   Diagnosis Date Noted  . Fasting hyperglycemia   . Aortic stenosis   . Hx of adenomatous colonic polyps   . Chronic anticoagulation 05/31/2010  . HYPERLIPIDEMIA 02/08/2009  . Hematochezia 01/20/2009  . Atrial fibrillation  09/09/2008    LABS    Component Value Date/Time   NA 139 02/03/2009 1505   K 4.0 02/03/2009 1505   CL 104 02/03/2009 1505   CO2 28 02/03/2009 1505   GLUCOSE 133* 02/03/2009 1505   BUN 20 02/03/2009 1505   CREATININE 0.92 02/03/2009 1505   CALCIUM 9.3 02/03/2009 1505   GFRNONAA 60* 02/03/2009 1505   GFRAA  Value: >60        The eGFR has been calculated using the MDRD equation. This calculation has not been validated in all clinical situations. eGFR's persistently <60 mL/min signify possible Chronic Kidney Disease. 02/03/2009 1505   CMP     Component Value Date/Time   NA 139 02/03/2009 1505   K 4.0 02/03/2009 1505   CL 104 02/03/2009 1505   CO2 28 02/03/2009 1505   GLUCOSE 133* 02/03/2009 1505   BUN 20 02/03/2009 1505   CREATININE 0.92 02/03/2009 1505   CALCIUM 9.3 02/03/2009 1505   PROT 6.2 02/03/2009 1505   ALBUMIN 3.6 02/03/2009 1505   AST 31 02/03/2009 1505   ALT 26 02/03/2009 1505   ALKPHOS 29* 02/03/2009  1505   BILITOT 0.7 02/03/2009 1505   GFRNONAA 60* 02/03/2009 1505   GFRAA  Value: >60        The eGFR has been calculated using the MDRD equation. This calculation has not been validated in all clinical situations. eGFR's persistently <60 mL/min signify possible Chronic Kidney Disease. 02/03/2009 1505       Component Value Date/Time   WBC 7.0 03/29/2009 1831   WBC 7.0 03/29/2009   WBC 9.1 02/05/2009 0518   HGB 13.6 03/29/2009 1831   HGB 13.6 03/29/2009   HGB 11.9* 02/05/2009 0518   HCT 42.8 03/29/2009 1831   HCT 42.8 03/29/2009   HCT 35.3* 02/05/2009 0518   MCV 89.7 03/29/2009 1831   MCV 89.7 03/29/2009   MCV 88.5 02/05/2009 0518    Lipid Panel     Component Value Date/Time   CHOL 182 03/24/2008   HDL 49 03/24/2008   LDLCALC 829 03/24/2008   LDLCALC 108 03/24/2008    ABG No results found for this basename: phart, pco2, pco2art, po2, po2art, hco3, tco2, acidbasedef, o2sat     Lab Results  Component Value Date   TSH 2.32 03/24/2008   BNP (last 3 results) No results  found for this basename: PROBNP,  in the last 8760 hours Cardiac Panel (last 3 results) No results found for this basename: CKTOTAL, CKMB, TROPONINI, RELINDX,  in the last 72 hours  Iron/TIBC/Ferritin No results found for this basename: iron, tibc, ferritin     EKG Orders placed in visit on 07/29/12  . EKG 12-LEAD     Prior Assessment and Plan Problem List as of 07/29/2012   HYPERLIPIDEMIA   Last Assessment & Plan   02/06/2011 Office Visit Written 02/06/2011  3:45 PM by Kathlen Brunswick, MD     Most recent lipid profile available to me from 2010 showed somewhat suboptimal control of hyperlipidemia, but patient has no known vascular disease and no apparent clinical manifestations of her lipid disorder.  Therapy with moderate dose simvastatin should represent adequate treatment for her.    Atrial fibrillation   Last Assessment & Plan   02/06/2011 Office Visit Written 02/06/2011  3:42 PM by Kathlen Brunswick, MD     Patient is asymptomatic with respect atrial fibrillation.  Control of heart rate with only 25 mg of metoprolol suggests a component of sick sinus syndrome as does her left anterior fascicular block.  Current therapy is appropriate and will be continued.    Chronic anticoagulation   Last Assessment & Plan   02/06/2011 Office Visit Written 02/06/2011  3:46 PM by Kathlen Brunswick, MD     Normal CBC and stool for Hemoccult testing within the past 12 months would tend to exclude significant GI blood loss.  We will continue to adjust warfarin dosing and to monitor for adverse effects.    Fasting hyperglycemia   Aortic stenosis   Hx of adenomatous colonic polyps   Hematochezia       Imaging: No results found.

## 2012-08-07 ENCOUNTER — Encounter: Payer: Self-pay | Admitting: Cardiology

## 2012-08-07 ENCOUNTER — Ambulatory Visit (INDEPENDENT_AMBULATORY_CARE_PROVIDER_SITE_OTHER): Payer: Medicare Other | Admitting: *Deleted

## 2012-08-07 DIAGNOSIS — Z7901 Long term (current) use of anticoagulants: Secondary | ICD-10-CM

## 2012-08-07 DIAGNOSIS — Z9289 Personal history of other medical treatment: Secondary | ICD-10-CM | POA: Insufficient documentation

## 2012-08-09 ENCOUNTER — Encounter: Payer: Self-pay | Admitting: *Deleted

## 2012-08-09 DIAGNOSIS — Z7901 Long term (current) use of anticoagulants: Secondary | ICD-10-CM

## 2012-08-09 LAB — POC HEMOCCULT BLD/STL (HOME/3-CARD/SCREEN): Card #3 Fecal Occult Blood, POC: NEGATIVE

## 2012-08-29 ENCOUNTER — Ambulatory Visit
Admission: RE | Admit: 2012-08-29 | Discharge: 2012-08-29 | Disposition: A | Discharge status: Home / Self Care | Payer: Medicare Other | Source: Ambulatory Visit

## 2012-08-29 DIAGNOSIS — Z1231 Encounter for screening mammogram for malignant neoplasm of breast: Secondary | ICD-10-CM

## 2012-09-02 ENCOUNTER — Ambulatory Visit (INDEPENDENT_AMBULATORY_CARE_PROVIDER_SITE_OTHER): Payer: Medicare Other | Admitting: *Deleted

## 2012-09-02 DIAGNOSIS — I4891 Unspecified atrial fibrillation: Secondary | ICD-10-CM

## 2012-09-02 DIAGNOSIS — Z7901 Long term (current) use of anticoagulants: Secondary | ICD-10-CM

## 2012-09-02 LAB — POCT INR: INR: 3

## 2012-10-24 ENCOUNTER — Ambulatory Visit (INDEPENDENT_AMBULATORY_CARE_PROVIDER_SITE_OTHER): Payer: Medicare Other | Admitting: *Deleted

## 2012-10-24 DIAGNOSIS — Z7901 Long term (current) use of anticoagulants: Secondary | ICD-10-CM

## 2012-10-24 DIAGNOSIS — I4891 Unspecified atrial fibrillation: Secondary | ICD-10-CM

## 2012-10-24 LAB — POCT INR: INR: 3.5

## 2012-11-25 ENCOUNTER — Ambulatory Visit (INDEPENDENT_AMBULATORY_CARE_PROVIDER_SITE_OTHER): Payer: Medicare Other | Admitting: *Deleted

## 2012-11-25 DIAGNOSIS — I4891 Unspecified atrial fibrillation: Secondary | ICD-10-CM

## 2012-11-25 DIAGNOSIS — Z7901 Long term (current) use of anticoagulants: Secondary | ICD-10-CM

## 2012-11-25 LAB — POCT INR: INR: 2.6

## 2012-11-27 ENCOUNTER — Other Ambulatory Visit: Payer: Self-pay | Admitting: Cardiology

## 2012-11-28 ENCOUNTER — Other Ambulatory Visit: Payer: Self-pay | Admitting: *Deleted

## 2012-11-28 MED ORDER — WARFARIN SODIUM 2 MG PO TABS: ORAL_TABLET | ORAL | Status: DC

## 2012-12-30 ENCOUNTER — Ambulatory Visit (INDEPENDENT_AMBULATORY_CARE_PROVIDER_SITE_OTHER): Payer: Medicare Other | Admitting: *Deleted

## 2012-12-30 DIAGNOSIS — I4891 Unspecified atrial fibrillation: Secondary | ICD-10-CM

## 2012-12-30 DIAGNOSIS — Z7901 Long term (current) use of anticoagulants: Secondary | ICD-10-CM

## 2012-12-30 LAB — POCT INR: INR: 2.7

## 2013-03-05 ENCOUNTER — Ambulatory Visit (INDEPENDENT_AMBULATORY_CARE_PROVIDER_SITE_OTHER): Payer: Medicare Other | Admitting: *Deleted

## 2013-03-05 DIAGNOSIS — Z7901 Long term (current) use of anticoagulants: Secondary | ICD-10-CM

## 2013-03-05 DIAGNOSIS — I4891 Unspecified atrial fibrillation: Secondary | ICD-10-CM

## 2013-03-05 LAB — POCT INR: INR: 2.6

## 2013-04-16 ENCOUNTER — Ambulatory Visit (INDEPENDENT_AMBULATORY_CARE_PROVIDER_SITE_OTHER): Payer: Medicare Other | Admitting: *Deleted

## 2013-04-16 DIAGNOSIS — Z7901 Long term (current) use of anticoagulants: Secondary | ICD-10-CM

## 2013-04-16 DIAGNOSIS — I4891 Unspecified atrial fibrillation: Secondary | ICD-10-CM

## 2013-04-16 DIAGNOSIS — Z5181 Encounter for therapeutic drug level monitoring: Secondary | ICD-10-CM

## 2013-04-16 LAB — POCT INR: INR: 2.6

## 2013-05-28 ENCOUNTER — Ambulatory Visit (INDEPENDENT_AMBULATORY_CARE_PROVIDER_SITE_OTHER): Payer: Medicare Other | Admitting: *Deleted

## 2013-05-28 DIAGNOSIS — I4891 Unspecified atrial fibrillation: Secondary | ICD-10-CM

## 2013-05-28 DIAGNOSIS — Z5181 Encounter for therapeutic drug level monitoring: Secondary | ICD-10-CM

## 2013-05-28 DIAGNOSIS — Z7901 Long term (current) use of anticoagulants: Secondary | ICD-10-CM

## 2013-05-28 LAB — POCT INR: INR: 1.8

## 2013-06-26 ENCOUNTER — Ambulatory Visit (INDEPENDENT_AMBULATORY_CARE_PROVIDER_SITE_OTHER): Payer: Medicare Other | Admitting: *Deleted

## 2013-06-26 DIAGNOSIS — Z7901 Long term (current) use of anticoagulants: Secondary | ICD-10-CM

## 2013-06-26 DIAGNOSIS — Z5181 Encounter for therapeutic drug level monitoring: Secondary | ICD-10-CM

## 2013-06-26 DIAGNOSIS — I4891 Unspecified atrial fibrillation: Secondary | ICD-10-CM

## 2013-06-26 LAB — POCT INR: INR: 2.4

## 2013-08-04 ENCOUNTER — Other Ambulatory Visit (HOSPITAL_COMMUNITY): Payer: Self-pay | Admitting: Internal Medicine

## 2013-08-04 DIAGNOSIS — M810 Age-related osteoporosis without current pathological fracture: Secondary | ICD-10-CM

## 2013-08-07 ENCOUNTER — Ambulatory Visit (INDEPENDENT_AMBULATORY_CARE_PROVIDER_SITE_OTHER): Payer: Medicare Other | Admitting: *Deleted

## 2013-08-07 ENCOUNTER — Other Ambulatory Visit (HOSPITAL_COMMUNITY): Payer: Self-pay | Admitting: Radiology

## 2013-08-07 ENCOUNTER — Other Ambulatory Visit: Payer: Self-pay

## 2013-08-07 DIAGNOSIS — Z5181 Encounter for therapeutic drug level monitoring: Secondary | ICD-10-CM

## 2013-08-07 DIAGNOSIS — Z1231 Encounter for screening mammogram for malignant neoplasm of breast: Secondary | ICD-10-CM

## 2013-08-07 DIAGNOSIS — Z7901 Long term (current) use of anticoagulants: Secondary | ICD-10-CM

## 2013-08-07 DIAGNOSIS — I4891 Unspecified atrial fibrillation: Secondary | ICD-10-CM

## 2013-08-07 LAB — POCT INR: INR: 2.2

## 2013-08-18 ENCOUNTER — Ambulatory Visit (HOSPITAL_COMMUNITY)
Admission: RE | Admit: 2013-08-18 | Discharge: 2013-08-18 | Disposition: A | Discharge status: Home / Self Care | Payer: Medicare Other | Source: Ambulatory Visit | Attending: Internal Medicine | Admitting: Internal Medicine

## 2013-08-18 DIAGNOSIS — M810 Age-related osteoporosis without current pathological fracture: Secondary | ICD-10-CM | POA: Insufficient documentation

## 2013-09-15 ENCOUNTER — Ambulatory Visit
Admission: RE | Admit: 2013-09-15 | Discharge: 2013-09-15 | Disposition: A | Discharge status: Home / Self Care | Payer: Medicare Other | Source: Ambulatory Visit

## 2013-09-15 DIAGNOSIS — Z1231 Encounter for screening mammogram for malignant neoplasm of breast: Secondary | ICD-10-CM

## 2013-09-18 ENCOUNTER — Ambulatory Visit (INDEPENDENT_AMBULATORY_CARE_PROVIDER_SITE_OTHER): Payer: Medicare Other | Admitting: Cardiology

## 2013-09-18 ENCOUNTER — Encounter: Payer: Self-pay | Admitting: Cardiology

## 2013-09-18 ENCOUNTER — Ambulatory Visit (INDEPENDENT_AMBULATORY_CARE_PROVIDER_SITE_OTHER): Payer: Medicare Other | Admitting: *Deleted

## 2013-09-18 VITALS — BP 116/72 | HR 89 | Ht 63.0 in | Wt 188.0 lb

## 2013-09-18 DIAGNOSIS — I482 Chronic atrial fibrillation, unspecified: Secondary | ICD-10-CM

## 2013-09-18 DIAGNOSIS — I4891 Unspecified atrial fibrillation: Secondary | ICD-10-CM

## 2013-09-18 DIAGNOSIS — R011 Cardiac murmur, unspecified: Secondary | ICD-10-CM

## 2013-09-18 DIAGNOSIS — Z7901 Long term (current) use of anticoagulants: Secondary | ICD-10-CM

## 2013-09-18 DIAGNOSIS — Z5181 Encounter for therapeutic drug level monitoring: Secondary | ICD-10-CM

## 2013-09-18 LAB — POCT INR: INR: 3

## 2013-09-18 NOTE — Patient Instructions (Signed)
Your physician wants you to follow-up in: 1 year You will receive a reminder letter in the mail two months in advance. If you don't receive a letter, please call our office to schedule the follow-up appointment.     Your physician recommends that you continue on your current medications as directed. Please refer to the Current Medication list given to you today.     Your physician has requested that you have an echocardiogram. Echocardiography is a painless test that uses sound waves to create images of your heart. It provides your doctor with information about the size and shape of your heart and how well your heart's chambers and valves are working. This procedure takes approximately one hour. There are no restrictions for this procedure.      Thank you for choosing [facility] !        

## 2013-09-18 NOTE — Progress Notes (Signed)
Clinical Summary Anne Wolf is a 80 y.o.female former patient of Dr Lattie Haw, this is our first visit together. She is seen for the following medical problems.  1. Afib - denies any palpitations - compliant with coumadin, denies any issues with bleeding  2. Hyperlipidemia - compliant with statin, no recent panel in our system. Reports recent labs with her pcp    Past Medical History  Diagnosis Date  . Atrial fibrillation     chronic anticoagulation; adequate HR control on minimal AV nodal blocking medication   . Aortic stenosis     mild; mild MR; slightly increased pulmonary artery pressure with mild RVH; normal LV-2010  . Hyperlipidemia   . Fasting hyperglycemia     + microalbuminuria; A1c of 6.5% in 11/2010  . Hematochezia 01/2009    01/2009-presumed ischemic colitis; history of diverticulosis  . Hx of adenomatous colonic polyps 2005    adenomatous polyp; negative colonoscopy in 2009     No Known Allergies   Current Outpatient Prescriptions  Medication Sig Dispense Refill  . alendronate (FOSAMAX) 70 MG tablet Take 70 mg by mouth every 7 (seven) days. Take with a full glass of water on an empty stomach.       Marland Kitchen Co-Enzyme Q-10 100 MG CAPS Take 1 capsule by mouth daily.        Marland Kitchen lisinopril (PRINIVIL,ZESTRIL) 10 MG tablet Take 10 mg by mouth daily.        . metoprolol succinate (TOPROL-XL) 25 MG 24 hr tablet Take 25 mg by mouth daily.        . Multiple Vitamins-Minerals (CENTRUM SILVER) tablet Take 1 tablet by mouth daily.        . simvastatin (ZOCOR) 40 MG tablet Take 40 mg by mouth at bedtime.        Marland Kitchen warfarin (COUMADIN) 2 MG tablet 1 tablet daily except 1/2 tablets on Tuesdays, Thursdays and Saturdays  90 tablet  3   No current facility-administered medications for this visit.     Past Surgical History  Procedure Laterality Date  . Colonoscopy  2009    negative; prior study with adenomatous polyp     No Known Allergies    Family History  Problem  Relation Age of Onset  . Cancer Father   . Arrhythmia Sister     Atrial fibrillation     Social History Ms. Rawdon reports that she has never smoked. She has never used smokeless tobacco. Ms. Vukelich reports that she does not drink alcohol.   Review of Systems CONSTITUTIONAL: No weight loss, fever, chills, weakness or fatigue.  HEENT: Eyes: No visual loss, blurred vision, double vision or yellow sclerae.No hearing loss, sneezing, congestion, runny nose or sore throat.  SKIN: No rash or itching.  CARDIOVASCULAR: per HPI RESPIRATORY: No shortness of breath, cough or sputum.  GASTROINTESTINAL: No anorexia, nausea, vomiting or diarrhea. No abdominal pain or blood.  GENITOURINARY: No burning on urination, no polyuria NEUROLOGICAL: No headache, dizziness, syncope, paralysis, ataxia, numbness or tingling in the extremities. No change in bowel or bladder control.  MUSCULOSKELETAL: No muscle, back pain, joint pain or stiffness.  LYMPHATICS: No enlarged nodes. No history of splenectomy.  PSYCHIATRIC: No history of depression or anxiety.  ENDOCRINOLOGIC: No reports of sweating, cold or heat intolerance. No polyuria or polydipsia.  Marland Kitchen   Physical Examination p 89 bp 116/72 Wt 188 lbs BMI 33 Gen: resting comfortably, no acute distress HEENT: no scleral icterus, pupils equal round and reactive, no palptable  cervical adenopathy,  CV: irreg, rate 80, 2/6 systolic murmur RUSB, no JVD Resp: Clear to auscultation bilaterally GI: abdomen is soft, non-tender, non-distended, normal bowel sounds, no hepatosplenomegaly MSK: extremities are warm, no edema.  Skin: warm, no rash Neuro:  no focal deficits Psych: appropriate affect   Diagnostic Studies 09/18/13 EKG afib rate 88, LAFB.     Assessment and Plan   1. Afib - no current symptoms, continue current meds - discussed NOACs, she elects to stay on coumadin for now  2. Heart murmur - mention in prior notes of aortic stenosis. Has murmur on  exam, will check echo   F/u 1 year  Arnoldo Lenis, M.D., F.A.C.C.

## 2013-10-30 ENCOUNTER — Ambulatory Visit (HOSPITAL_COMMUNITY)
Admission: RE | Admit: 2013-10-30 | Discharge: 2013-10-30 | Disposition: A | Discharge status: Home / Self Care | Payer: Medicare Other | Source: Ambulatory Visit | Attending: Cardiology | Admitting: Cardiology

## 2013-10-30 ENCOUNTER — Telehealth: Payer: Self-pay | Admitting: *Deleted

## 2013-10-30 ENCOUNTER — Telehealth: Payer: Self-pay

## 2013-10-30 ENCOUNTER — Ambulatory Visit (INDEPENDENT_AMBULATORY_CARE_PROVIDER_SITE_OTHER): Payer: Medicare Other | Admitting: *Deleted

## 2013-10-30 DIAGNOSIS — I080 Rheumatic disorders of both mitral and aortic valves: Secondary | ICD-10-CM | POA: Diagnosis not present

## 2013-10-30 DIAGNOSIS — I079 Rheumatic tricuspid valve disease, unspecified: Secondary | ICD-10-CM | POA: Diagnosis not present

## 2013-10-30 DIAGNOSIS — I4891 Unspecified atrial fibrillation: Secondary | ICD-10-CM | POA: Insufficient documentation

## 2013-10-30 DIAGNOSIS — I482 Chronic atrial fibrillation, unspecified: Secondary | ICD-10-CM

## 2013-10-30 DIAGNOSIS — Z7901 Long term (current) use of anticoagulants: Secondary | ICD-10-CM

## 2013-10-30 DIAGNOSIS — Z5181 Encounter for therapeutic drug level monitoring: Secondary | ICD-10-CM

## 2013-10-30 DIAGNOSIS — E785 Hyperlipidemia, unspecified: Secondary | ICD-10-CM | POA: Insufficient documentation

## 2013-10-30 DIAGNOSIS — I359 Nonrheumatic aortic valve disorder, unspecified: Secondary | ICD-10-CM

## 2013-10-30 DIAGNOSIS — I350 Nonrheumatic aortic (valve) stenosis: Secondary | ICD-10-CM

## 2013-10-30 LAB — POCT INR: INR: 2.6

## 2013-10-30 NOTE — Telephone Encounter (Signed)
The patient has been notified of this information and all questions answered.  Forwarded to Dr. Willey Blade

## 2013-10-30 NOTE — Telephone Encounter (Signed)
Message copied by Bernita Raisin on Thu Oct 30, 2013  3:43 PM ------      Message from: [place] F      Created: Thu Oct 30, 2013  2:17 PM       Echo shows overall good heart function. One of her heart valves is mildly thickened (mild aortic stenosis), this not an issue at this time and may never be, but we will continue to follow.            Zandra Abts MD ------

## 2013-10-30 NOTE — Progress Notes (Signed)
  Echocardiogram 2D Echocardiogram has been performed.  [place], [place] 10/30/2013, 10:46 AM

## 2013-10-30 NOTE — Telephone Encounter (Signed)
No answer, vm not set up.

## 2013-10-30 NOTE — Telephone Encounter (Signed)
Message copied by Desma Mcgregor on Thu Oct 30, 2013  4:45 PM ------      Message from: [place] F      Created: Thu Oct 30, 2013  2:17 PM       Echo shows overall good heart function. One of her heart valves is mildly thickened (mild aortic stenosis), this not an issue at this time and may never be, but we will continue to follow.            Zandra Abts MD ------

## 2013-12-17 ENCOUNTER — Ambulatory Visit (INDEPENDENT_AMBULATORY_CARE_PROVIDER_SITE_OTHER): Payer: Medicare Other | Admitting: *Deleted

## 2013-12-17 DIAGNOSIS — I4891 Unspecified atrial fibrillation: Secondary | ICD-10-CM

## 2013-12-17 DIAGNOSIS — Z5181 Encounter for therapeutic drug level monitoring: Secondary | ICD-10-CM

## 2013-12-17 DIAGNOSIS — Z7901 Long term (current) use of anticoagulants: Secondary | ICD-10-CM

## 2013-12-17 LAB — POCT INR: INR: 2.6

## 2013-12-22 ENCOUNTER — Telehealth: Payer: Self-pay | Admitting: Cardiology

## 2013-12-22 MED ORDER — WARFARIN SODIUM 2 MG PO TABS: ORAL_TABLET | ORAL | Status: DC

## 2013-12-22 NOTE — Telephone Encounter (Signed)
Refill for coumadin 2 mg quantity 90 day [facility]. Will forward to coumadin nurse

## 2013-12-22 NOTE — Telephone Encounter (Signed)
Please see refill bin / tgs  °

## 2014-01-28 ENCOUNTER — Ambulatory Visit (INDEPENDENT_AMBULATORY_CARE_PROVIDER_SITE_OTHER): Payer: Medicare Other | Admitting: *Deleted

## 2014-01-28 DIAGNOSIS — Z7901 Long term (current) use of anticoagulants: Secondary | ICD-10-CM

## 2014-01-28 DIAGNOSIS — Z5181 Encounter for therapeutic drug level monitoring: Secondary | ICD-10-CM

## 2014-01-28 DIAGNOSIS — I4891 Unspecified atrial fibrillation: Secondary | ICD-10-CM

## 2014-01-28 LAB — POCT INR: INR: 1.8

## 2014-02-25 ENCOUNTER — Ambulatory Visit (INDEPENDENT_AMBULATORY_CARE_PROVIDER_SITE_OTHER): Payer: Medicare Other | Admitting: *Deleted

## 2014-02-25 DIAGNOSIS — Z7901 Long term (current) use of anticoagulants: Secondary | ICD-10-CM

## 2014-02-25 DIAGNOSIS — I4891 Unspecified atrial fibrillation: Secondary | ICD-10-CM

## 2014-02-25 DIAGNOSIS — Z5181 Encounter for therapeutic drug level monitoring: Secondary | ICD-10-CM | POA: Diagnosis not present

## 2014-02-25 LAB — POCT INR: INR: 2.5

## 2014-03-02 DIAGNOSIS — H2511 Age-related nuclear cataract, right eye: Secondary | ICD-10-CM | POA: Diagnosis not present

## 2014-03-02 DIAGNOSIS — H538 Other visual disturbances: Secondary | ICD-10-CM | POA: Diagnosis not present

## 2014-03-16 NOTE — Patient Instructions (Signed)
Anne Wolf  03/16/2014   Your procedure is scheduled on:  03/23/2014  Report to [facility] at 7:00 AM.  Call this number if you have problems the morning of surgery: [phone]   Remember:   Do not eat food or drink liquids after midnight.   Take these medicines the morning of surgery with A SIP OF WATER: Fosamax, Lisinopril, Metoprolol (DO NOT TAKE METFORMIN AM OF  SURGERY AND WE WILL CHECK YOUR GLUCOSE)   Do not wear jewelry, make-up or nail polish.  Do not wear lotions, powders, or perfumes. You may wear deodorant.  Do not shave 48 hours prior to surgery. Men may shave face and neck.  Do not bring valuables to the hospital.  [facility] is not responsible for any belongings or valuables.               Contacts, dentures or bridgework may not be worn into surgery.  Leave suitcase in the car. After surgery it may be brought to your room.  For patients admitted to the hospital, discharge time is determined by your treatment team.               Patients discharged the day of surgery will not be allowed to drive home.  Name and phone number of your driver:   Special Instructions: N/A   Please read over the following fact sheets that you were given: Anesthesia Post-op Instructions   PATIENT INSTRUCTIONS POST-ANESTHESIA  IMMEDIATELY FOLLOWING SURGERY:  Do not drive or operate machinery for the first twenty four hours after surgery.  Do not make any important decisions for twenty four hours after surgery or while taking narcotic pain medications or sedatives.  If you develop intractable nausea and vomiting or a severe headache please notify your doctor immediately.  FOLLOW-UP:  Please make an appointment with your surgeon as instructed. You do not need to follow up with anesthesia unless specifically instructed to do so.  WOUND CARE INSTRUCTIONS (if applicable):  Keep a dry clean dressing on the anesthesia/puncture wound site if there is drainage.  Once the wound has quit draining you  may leave it open to air.  Generally you should leave the bandage intact for twenty four hours unless there is drainage.  If the epidural site drains for more than 36-48 hours please call the anesthesia department.  QUESTIONS?:  Please feel free to call your physician or the hospital operator if you have any questions, and they will be happy to assist you.      Cataract Surgery  A cataract is a clouding of the lens of the eye. When a lens becomes cloudy, vision is reduced based on the degree and nature of the clouding. Surgery may be needed to improve vision. Surgery removes the cloudy lens and usually replaces it with a substitute lens (intraocular lens, IOL). LET YOUR EYE DOCTOR KNOW ABOUT:  Allergies to food or medicine.  Medicines taken including herbs, eye drops, over-the-counter medicines, and creams.  Use of steroids (by mouth or creams).  Previous problems with anesthetics or numbing medicine.  History of bleeding problems or blood clots.  Previous surgery.  Other health problems, including diabetes and kidney problems.  Possibility of pregnancy, if this applies. RISKS AND COMPLICATIONS  Infection.  Inflammation of the eyeball (endophthalmitis) that can spread to both eyes (sympathetic ophthalmia).  Poor wound healing.  If an IOL is inserted, it can later fall out of proper position. This is very uncommon.  Clouding of the  part of your eye that holds an IOL in place. This is called an "after-cataract." These are uncommon but easily treated. BEFORE THE PROCEDURE  Do not eat or drink anything except small amounts of water for 8 to 12 before your surgery, or as directed by your caregiver.  Unless you are told otherwise, continue any eye drops you have been prescribed.  Talk to your primary caregiver about all other medicines that you take (both prescription and nonprescription). In some cases, you may need to stop or change medicines near the time of your surgery. This  is most important if you are taking blood-thinning medicine.Do not stop medicines unless you are told to do so.  Arrange for someone to drive you to and from the procedure.  Do not put contact lenses in either eye on the day of your surgery. PROCEDURE There is more than one method for safely removing a cataract. Your doctor can explain the differences and help determine which is best for you. Phacoemulsification surgery is the most common form of cataract surgery.  An injection is given behind the eye or eye drops are given to make this a painless procedure.  A small cut (incision) is made on the edge of the clear, dome-shaped surface that covers the front of the eye (cornea).  A tiny probe is painlessly inserted into the eye. This device gives off ultrasound waves that soften and break up the cloudy center of the lens. This makes it easier for the cloudy lens to be removed by suction.  An IOL may be implanted.  The normal lens of the eye is covered by a clear capsule. Part of that capsule is intentionally left in the eye to support the IOL.  Your surgeon may or may not use stitches to close the incision. There are other forms of cataract surgery that require a larger incision and stitches to close the eye. This approach is taken in cases where the doctor feels that the cataract cannot be easily removed using phacoemulsification. AFTER THE PROCEDURE  When an IOL is implanted, it does not need care. It becomes a permanent part of your eye and cannot be seen or felt.  Your doctor will schedule follow-up exams to check on your progress.  Review your other medicines with your doctor to see which can be resumed after surgery.  Use eye drops or take medicine as prescribed by your doctor. Document Released: 01/26/2011 Document Revised: 06/23/2013 Document Reviewed: 01/26/2011 [facility] Patient Information 2015 [place], [place]. This information is not intended to replace advice given to you by  your health care provider. Make sure you discuss any questions you have with your health care provider.

## 2014-03-17 ENCOUNTER — Encounter (HOSPITAL_COMMUNITY): Payer: Self-pay

## 2014-03-17 ENCOUNTER — Encounter (HOSPITAL_COMMUNITY)
Admission: RE | Admit: 2014-03-17 | Discharge: 2014-03-17 | Disposition: A | Discharge status: Home / Self Care | Payer: Medicare Other | Source: Ambulatory Visit | Attending: Ophthalmology | Admitting: Ophthalmology

## 2014-03-17 DIAGNOSIS — Z01812 Encounter for preprocedural laboratory examination: Secondary | ICD-10-CM | POA: Insufficient documentation

## 2014-03-17 DIAGNOSIS — H2511 Age-related nuclear cataract, right eye: Secondary | ICD-10-CM | POA: Diagnosis not present

## 2014-03-17 HISTORY — DX: Type 2 diabetes mellitus without complications: E11.9

## 2014-03-17 HISTORY — DX: Cardiac arrhythmia, unspecified: I49.9

## 2014-03-17 LAB — CBC
HCT: 42.6 % (ref 36.0–46.0)
Hemoglobin: 13.8 g/dL (ref 12.0–15.0)
MCH: 29 pg (ref 26.0–34.0)
MCHC: 32.4 g/dL (ref 30.0–36.0)
MCV: 89.5 fL (ref 78.0–100.0)
PLATELETS: 228 10*3/uL (ref 150–400)
RBC: 4.76 MIL/uL (ref 3.87–5.11)
RDW: 14 % (ref 11.5–15.5)
WBC: 9.3 10*3/uL (ref 4.0–10.5)

## 2014-03-17 LAB — BASIC METABOLIC PANEL
Anion gap: 4 — ABNORMAL LOW (ref 5–15)
BUN: 20 mg/dL (ref 6–23)
CALCIUM: 9.5 mg/dL (ref 8.4–10.5)
CO2: 28 mmol/L (ref 19–32)
Chloride: 108 mmol/L (ref 96–112)
Creatinine, Ser: 0.76 mg/dL (ref 0.50–1.10)
GFR calc Af Amer: >90 mL/min (ref >90)
GFR calc non Af Amer: 79 mL/min — ABNORMAL LOW (ref >90)
Glucose, Bld: 106 mg/dL — ABNORMAL HIGH (ref 70–99)
Potassium: 4.6 mmol/L (ref 3.5–5.1)
Sodium: 140 mmol/L (ref 135–145)

## 2014-03-23 ENCOUNTER — Ambulatory Visit (HOSPITAL_COMMUNITY)
Admission: RE | Admit: 2014-03-23 | Discharge: 2014-03-23 | Disposition: A | Discharge status: Home / Self Care | Payer: Medicare Other | Source: Ambulatory Visit | Attending: Ophthalmology | Admitting: Ophthalmology

## 2014-03-23 ENCOUNTER — Ambulatory Visit (HOSPITAL_COMMUNITY): Payer: Medicare Other | Admitting: Anesthesiology

## 2014-03-23 ENCOUNTER — Encounter (HOSPITAL_COMMUNITY)
Admission: RE | Disposition: A | Discharge status: Home / Self Care | Payer: Self-pay | Source: Ambulatory Visit | Attending: Ophthalmology

## 2014-03-23 ENCOUNTER — Encounter (HOSPITAL_COMMUNITY): Payer: Self-pay | Admitting: *Deleted

## 2014-03-23 DIAGNOSIS — H259 Unspecified age-related cataract: Secondary | ICD-10-CM | POA: Diagnosis not present

## 2014-03-23 DIAGNOSIS — H25011 Cortical age-related cataract, right eye: Secondary | ICD-10-CM | POA: Diagnosis not present

## 2014-03-23 DIAGNOSIS — H2511 Age-related nuclear cataract, right eye: Secondary | ICD-10-CM | POA: Diagnosis not present

## 2014-03-23 DIAGNOSIS — H538 Other visual disturbances: Secondary | ICD-10-CM | POA: Diagnosis not present

## 2014-03-23 HISTORY — PX: CATARACT EXTRACTION W/PHACO: SHX586

## 2014-03-23 LAB — GLUCOSE, CAPILLARY: Glucose-Capillary: 100 mg/dL — ABNORMAL HIGH (ref 70–99)

## 2014-03-23 SURGERY — PHACOEMULSIFICATION, CATARACT, WITH IOL INSERTION
Anesthesia: Monitor Anesthesia Care | Site: Eye | Laterality: Right

## 2014-03-23 MED ORDER — NA HYALUR & NA CHOND-NA HYALUR 0.55-0.5 ML IO KIT
PACK | INTRAOCULAR | Status: DC | PRN
  Administered 2014-03-23: 1 via OPHTHALMIC

## 2014-03-23 MED ORDER — TETRACAINE HCL 0.5 % OP SOLN
OPHTHALMIC | Status: AC
  Filled 2014-03-23: qty 2

## 2014-03-23 MED ORDER — LIDOCAINE HCL 3.5 % OP GEL
OPHTHALMIC | Status: DC | PRN
  Administered 2014-03-23: 1 via OPHTHALMIC

## 2014-03-23 MED ORDER — FENTANYL CITRATE 0.05 MG/ML IJ SOLN
INTRAMUSCULAR | Status: AC
Start: 2014-03-23 — End: 2014-03-23
  Filled 2014-03-23: qty 2

## 2014-03-23 MED ORDER — CYCLOPENTOLATE-PHENYLEPHRINE OP SOLN OPTIME - NO CHARGE
OPHTHALMIC | Status: AC
  Filled 2014-03-23: qty 2

## 2014-03-23 MED ORDER — LIDOCAINE HCL 3.5 % OP GEL
OPHTHALMIC | Status: AC
  Filled 2014-03-23: qty 1

## 2014-03-23 MED ORDER — EPINEPHRINE HCL 1 MG/ML IJ SOLN
INTRAMUSCULAR | Status: AC
  Filled 2014-03-23: qty 1

## 2014-03-23 MED ORDER — FENTANYL CITRATE 0.05 MG/ML IJ SOLN
25.0000 ug | INTRAMUSCULAR | Status: AC
  Administered 2014-03-23: 25 ug via INTRAVENOUS

## 2014-03-23 MED ORDER — LACTATED RINGERS IV SOLN
INTRAVENOUS | Status: DC
  Administered 2014-03-23: 08:00:00 via INTRAVENOUS

## 2014-03-23 MED ORDER — MIDAZOLAM HCL 2 MG/2ML IJ SOLN
1.0000 mg | INTRAMUSCULAR | Status: DC | PRN
  Administered 2014-03-23: 2 mg via INTRAVENOUS

## 2014-03-23 MED ORDER — TETRACAINE 0.5 % OP SOLN OPTIME - NO CHARGE
OPHTHALMIC | Status: DC | PRN
  Administered 2014-03-23: 1 [drp]

## 2014-03-23 MED ORDER — BSS IO SOLN
INTRAOCULAR | Status: DC | PRN
  Administered 2014-03-23: 09:00:00

## 2014-03-23 MED ORDER — MIDAZOLAM HCL 2 MG/2ML IJ SOLN
INTRAMUSCULAR | Status: AC
  Filled 2014-03-23: qty 2

## 2014-03-23 MED ORDER — LIDOCAINE HCL 3.5 % OP GEL: 1.0000 "application " | Freq: Once | OPHTHALMIC | Status: DC

## 2014-03-23 MED ORDER — TETRACAINE HCL 0.5 % OP SOLN
1.0000 [drp] | OPHTHALMIC | Status: AC
  Administered 2014-03-23 (×3): 1 [drp] via OPHTHALMIC

## 2014-03-23 MED ORDER — CYCLOPENTOLATE-PHENYLEPHRINE 0.2-1 % OP SOLN
1.0000 [drp] | OPHTHALMIC | Status: AC
  Administered 2014-03-23 (×3): 1 [drp] via OPHTHALMIC

## 2014-03-23 MED ORDER — POVIDONE-IODINE 5 % OP SOLN
OPHTHALMIC | Status: DC | PRN
  Administered 2014-03-23: 1 via OPHTHALMIC

## 2014-03-23 SURGICAL SUPPLY — 28 items
CAPSULAR TENSION RING-AMO (OPHTHALMIC RELATED) IMPLANT
CLOTH BEACON ORANGE TIMEOUT ST (SAFETY) ×2 IMPLANT
GLOVE BIO SURGEON STRL SZ7.5 (GLOVE) IMPLANT
GLOVE BIOGEL M 6.5 STRL (GLOVE) IMPLANT
GLOVE BIOGEL PI IND STRL 6.5 (GLOVE) IMPLANT
GLOVE BIOGEL PI IND STRL 7.0 (GLOVE) ×1 IMPLANT
GLOVE BIOGEL PI INDICATOR 6.5 (GLOVE)
GLOVE BIOGEL PI INDICATOR 7.0 (GLOVE) ×1
GLOVE ECLIPSE 6.5 STRL STRAW (GLOVE) IMPLANT
GLOVE ECLIPSE 7.5 STRL STRAW (GLOVE) IMPLANT
GLOVE EXAM NITRILE LRG STRL (GLOVE) IMPLANT
GLOVE EXAM NITRILE MD LF STRL (GLOVE) IMPLANT
GLOVE SKINSENSE NS SZ6.5 (GLOVE)
GLOVE SKINSENSE NS SZ7.0 (GLOVE)
GLOVE SKINSENSE STRL SZ6.5 (GLOVE) IMPLANT
GLOVE SKINSENSE STRL SZ7.0 (GLOVE) IMPLANT
INST SET CATARACT ~~LOC~~ (KITS) ×2 IMPLANT
KIT VITRECTOMY (OPHTHALMIC RELATED) IMPLANT
LENS ALC ACRYL/TECN (Ophthalmic Related) ×2 IMPLANT
PAD ARMBOARD 7.5X6 YLW CONV (MISCELLANEOUS) ×2 IMPLANT
PROC W NO LENS (INTRAOCULAR LENS)
PROC W SPEC LENS (INTRAOCULAR LENS)
PROCESS W NO LENS (INTRAOCULAR LENS) IMPLANT
PROCESS W SPEC LENS (INTRAOCULAR LENS) IMPLANT
RETRACTOR IRIS SIGHTPATH (OPHTHALMIC RELATED) IMPLANT
RING MALYGIN (MISCELLANEOUS) IMPLANT
VISCOELASTIC ADDITIONAL (OPHTHALMIC RELATED) IMPLANT
WATER STERILE IRR 250ML POUR (IV SOLUTION) ×2 IMPLANT

## 2014-03-23 NOTE — Discharge Instructions (Signed)
TENECIA IGNASIAK 03/23/2014 Dr. Iona Hansen Post operative Instructions for Cataract Patients  These instructions are for Anne Wolf and pertain to the operative eye.  1.  Resume your normal diet and previous oral medicines.  2. Your Follow-up appointment is at Dr. Iona Hansen' office in [place] on 03/24/14 at 9:45am  3. You may leave the hospital when your driver is present and your nurse releases you.  4. Begin Pred Forte (prednisolone acetate 1%), Acular LS (ketorolac tromethamine .4%) and Gatifloxacin 0.5% eye drops; 1 drop each 4 times daily to operative eye. Begin 3 hours after discharge from Short Stay Unit.  Moxifloxacin 0.5% may be substituted for Gatifloxacin using the same instructions.  37. Page Dr. Iona Hansen via beeper [phone] for significant pain in or around operative eye that is not relieved by Tylenol.  6. If you took Plavix before surgery, restart it at the usual dose on the evening of surgery.  7. Wear dark glasses as necessary for excessive light sensitivity.  8. Do no forcefully rub you your operative eye.  9. Keep your operative eye dry for 1 week. You may gently clean your eyelids with a damp washcloth.  10. You may resume normal occupational activities in one week and resume driving as tolerated after the first post operative visit.  11. It is normal to have blurred vision and a scratchy sensation following surgery.  Dr. Iona Hansen: [phone]

## 2014-03-23 NOTE — Brief Op Note (Signed)
03/23/2014  9:28 AM  PATIENT:  Anne Wolf  81 y.o. female  PRE-OPERATIVE DIAGNOSIS:  nuclear cataract right eye  POST-OPERATIVE DIAGNOSIS:  nuclear cataract right eye  PROCEDURE:  Procedure(s): CATARACT EXTRACTION PHACO AND INTRAOCULAR LENS PLACEMENT ([place])  SURGEON:  Surgeon(s): Williams Che, MD  ASSISTANTS: Bonney Roussel, CST   ANESTHESIA STAFF: Anesthesiologist: Lerry Liner, MD CRNA: Ollen Bowl, CRNA  ANESTHESIA:   topical and MAC  REQUESTED LENS POWER: 14.5  LENS IMPLANT INFORMATION:  Alcon SN60WF  S/n [id number]  Exp 11/2017  CUMULATIVE DISSIPATED ENERGY:2.83  INDICATIONS:see office H&P  OP FINDINGS:dense NS  COMPLICATIONS:None  DICTATION #: see scanned op note  PLAN OF CARE: as above  PATIENT DISPOSITION:  Short Stay

## 2014-03-23 NOTE — Transfer of Care (Signed)
Immediate Anesthesia Transfer of Care Note  Patient: Anne Wolf  Procedure(s) Performed: Procedure(s) with comments: CATARACT EXTRACTION PHACO AND INTRAOCULAR LENS PLACEMENT (IOC) (Right) - CDE:2.83  Patient Location: Short Stay  Anesthesia Type:MAC  Level of Consciousness: awake  Airway & Oxygen Therapy: Patient Spontanous Breathing  Post-op Assessment: Report given to RN  Post vital signs: Reviewed  Last Vitals:  Filed Vitals:   03/23/14 0835  BP: 107/60  Pulse:   Temp:   Resp: 17    Complications: No apparent anesthesia complications

## 2014-03-23 NOTE — Anesthesia Preprocedure Evaluation (Signed)
Anesthesia Evaluation  Patient identified by MRN, date of birth, ID band Patient awake    Reviewed: Allergy & Precautions, NPO status , Patient's Chart, lab work & pertinent test results  Airway Mallampati: II  TM Distance: >3 FB     Dental  (+) Teeth Intact, Implants   Pulmonary  breath sounds clear to auscultation        Cardiovascular + dysrhythmias Atrial Fibrillation + Valvular Problems/Murmurs AS and MR Rhythm:Irregular Rate:Normal     Neuro/Psych    GI/Hepatic   Endo/Other  diabetes, Well Controlled, Type 2, Oral Hypoglycemic Agents  Renal/GU      Musculoskeletal   Abdominal   Peds  Hematology   Anesthesia Other Findings   Reproductive/Obstetrics                             Anesthesia Physical Anesthesia Plan  ASA: III  Anesthesia Plan: MAC   Post-op Pain Management:    Induction: Intravenous  Airway Management Planned: Nasal Cannula  Additional Equipment:   Intra-op Plan:   Post-operative Plan:   Informed Consent: I have reviewed the patients History and Physical, chart, labs and discussed the procedure including the risks, benefits and alternatives for the proposed anesthesia with the patient or authorized representative who has indicated his/her understanding and acceptance.     Plan Discussed with:   Anesthesia Plan Comments:         Anesthesia Quick Evaluation

## 2014-03-23 NOTE — Op Note (Signed)
9:28 AM  PATIENT: Anne Wolf 81 y.o. female  PRE-OPERATIVE DIAGNOSIS: nuclear cataract right eye  POST-OPERATIVE DIAGNOSIS: nuclear cataract right eye  PROCEDURE: Procedure(s):  CATARACT EXTRACTION PHACO AND INTRAOCULAR LENS PLACEMENT ([place])  SURGEON: Surgeon(s):  Williams Che, MD  ASSISTANTS: Bonney Roussel, CST  ANESTHESIA STAFF: Anesthesiologist: Lerry Liner, MD  CRNA: Ollen Bowl, CRNA  ANESTHESIA: topical and MAC  REQUESTED LENS POWER: 14.5  LENS IMPLANT INFORMATION: Alcon SN60WF S/n [id number] Exp 11/2017  CUMULATIVE DISSIPATED ENERGY:2.83  INDICATIONS:see office H&P  OP FINDINGS:dense NS  COMPLICATIONS:None  DICTATION : The patient was brought to the operating room in good condition.  The operative eye was prepped and draped in the usual fashion for intraocular surgery.  Lidocaine gel was dropped onto the eye.  A 2.4 mm 10 O'clock near clear corneal stepped incision and a 12 O'clock stab incision were created.  Viscoat was instilled into the anterior chamber.  The 5 mm anterior capsulorhexis was performed with a bent needle cystotome and Utrata forceps.  The lens was hydrodissected and hydrodelineated with a cannula and balanced salt solution and rotated with a Kuglen hook.  Phacoemulsification was perfomed in the divide and conquer technique.  The remaining cortex was removed with I&A and the capsular surfaces polished as necessary.  Provisc was placed into the capsular bag and the lens inserted with the Alcon inserter.  The viscoelastic was removed with I&A and the lens "rocked" into position.  The wounds were hydrated and te anterior chamber was refilled with balanced salt solution.  The wounds were checked for leakage and rehydrated as necessary.  The lid speculum and drapes were removed and the patient was transported to short stay in good condition.

## 2014-03-23 NOTE — Anesthesia Postprocedure Evaluation (Signed)
  Anesthesia Post-op Note  Patient: Anne Wolf  Procedure(s) Performed: Procedure(s) with comments: CATARACT EXTRACTION PHACO AND INTRAOCULAR LENS PLACEMENT (IOC) (Right) - CDE:2.83  Patient Location: Short Stay  Anesthesia Type:MAC  Level of Consciousness: awake, alert  and oriented  Airway and Oxygen Therapy: Patient Spontanous Breathing  Post-op Pain: none  Post-op Assessment: Post-op Vital signs reviewed, Patient's Cardiovascular Status Stable, Respiratory Function Stable, Patent Airway and No signs of Nausea or vomiting  Post-op Vital Signs: Reviewed and stable  Last Vitals:  Filed Vitals:   03/23/14 0835  BP: 107/60  Pulse:   Temp:   Resp: 17    Complications: No apparent anesthesia complications

## 2014-03-23 NOTE — H&P (Signed)
I have reviewed the pre printed H&P, the patient was re-examined, and I have identified no significant interval changes in the patient's medical condition.  There is no change in the plan of care since the history and physical of record. 

## 2014-03-24 ENCOUNTER — Encounter (HOSPITAL_COMMUNITY): Payer: Self-pay | Admitting: Ophthalmology

## 2014-03-24 MED ORDER — BSS IO SOLN
INTRAOCULAR | Status: DC | PRN
  Administered 2014-03-23: 15 mL via INTRAOCULAR

## 2014-04-07 DIAGNOSIS — E119 Type 2 diabetes mellitus without complications: Secondary | ICD-10-CM | POA: Diagnosis not present

## 2014-04-08 ENCOUNTER — Ambulatory Visit (INDEPENDENT_AMBULATORY_CARE_PROVIDER_SITE_OTHER): Payer: Medicare Other | Admitting: *Deleted

## 2014-04-08 DIAGNOSIS — Z7901 Long term (current) use of anticoagulants: Secondary | ICD-10-CM | POA: Diagnosis not present

## 2014-04-08 DIAGNOSIS — I480 Paroxysmal atrial fibrillation: Secondary | ICD-10-CM

## 2014-04-08 DIAGNOSIS — I4891 Unspecified atrial fibrillation: Secondary | ICD-10-CM | POA: Diagnosis not present

## 2014-04-08 DIAGNOSIS — Z5181 Encounter for therapeutic drug level monitoring: Secondary | ICD-10-CM | POA: Diagnosis not present

## 2014-04-08 LAB — POCT INR: INR: 2

## 2014-04-13 DIAGNOSIS — I4891 Unspecified atrial fibrillation: Secondary | ICD-10-CM | POA: Diagnosis not present

## 2014-04-13 DIAGNOSIS — E1129 Type 2 diabetes mellitus with other diabetic kidney complication: Secondary | ICD-10-CM | POA: Diagnosis not present

## 2014-04-20 DIAGNOSIS — R6889 Other general symptoms and signs: Secondary | ICD-10-CM | POA: Diagnosis not present

## 2014-05-20 ENCOUNTER — Ambulatory Visit (INDEPENDENT_AMBULATORY_CARE_PROVIDER_SITE_OTHER): Payer: Medicare Other | Admitting: *Deleted

## 2014-05-20 DIAGNOSIS — Z5181 Encounter for therapeutic drug level monitoring: Secondary | ICD-10-CM

## 2014-05-20 DIAGNOSIS — Z7901 Long term (current) use of anticoagulants: Secondary | ICD-10-CM | POA: Diagnosis not present

## 2014-05-20 DIAGNOSIS — I4891 Unspecified atrial fibrillation: Secondary | ICD-10-CM

## 2014-05-20 DIAGNOSIS — I480 Paroxysmal atrial fibrillation: Secondary | ICD-10-CM

## 2014-05-20 LAB — POCT INR: INR: 1.7

## 2014-06-03 ENCOUNTER — Ambulatory Visit (INDEPENDENT_AMBULATORY_CARE_PROVIDER_SITE_OTHER): Payer: Medicare Other | Admitting: *Deleted

## 2014-06-03 DIAGNOSIS — Z7901 Long term (current) use of anticoagulants: Secondary | ICD-10-CM | POA: Diagnosis not present

## 2014-06-03 DIAGNOSIS — Z5181 Encounter for therapeutic drug level monitoring: Secondary | ICD-10-CM | POA: Diagnosis not present

## 2014-06-03 DIAGNOSIS — I4891 Unspecified atrial fibrillation: Secondary | ICD-10-CM

## 2014-06-03 DIAGNOSIS — I480 Paroxysmal atrial fibrillation: Secondary | ICD-10-CM | POA: Diagnosis not present

## 2014-06-03 LAB — POCT INR: INR: 2.2

## 2014-06-03 MED ORDER — WARFARIN SODIUM 2 MG PO TABS: ORAL_TABLET | ORAL | Status: DC

## 2014-07-01 ENCOUNTER — Ambulatory Visit (INDEPENDENT_AMBULATORY_CARE_PROVIDER_SITE_OTHER): Payer: Medicare Other | Admitting: *Deleted

## 2014-07-01 DIAGNOSIS — I4891 Unspecified atrial fibrillation: Secondary | ICD-10-CM | POA: Diagnosis not present

## 2014-07-01 DIAGNOSIS — Z5181 Encounter for therapeutic drug level monitoring: Secondary | ICD-10-CM | POA: Diagnosis not present

## 2014-07-01 DIAGNOSIS — Z7901 Long term (current) use of anticoagulants: Secondary | ICD-10-CM | POA: Diagnosis not present

## 2014-07-01 LAB — POCT INR: INR: 2.8

## 2014-07-21 DIAGNOSIS — H538 Other visual disturbances: Secondary | ICD-10-CM | POA: Diagnosis not present

## 2014-07-21 DIAGNOSIS — H2512 Age-related nuclear cataract, left eye: Secondary | ICD-10-CM | POA: Diagnosis not present

## 2014-07-23 NOTE — Patient Instructions (Signed)
Your procedure is scheduled on:  07/27/14  Report to [facility] at 10:00 AM.  Call this number if you have problems the morning of surgery: [phone]   Remember:   Do not eat food or drink liquids after midnight.   Take these medicines the morning of surgery with A SIP OF WATER: Lisinopril and Metoprolol   Do not wear jewelry, make-up or nail polish.  Do not wear lotions, powders, or perfumes. You may wear deodorant.  Do not bring valuables to the hospital.  [facility] is not responsible for any belongings or valuables.               Contacts, dentures or bridgework may not be worn into surgery.               Patients discharged the day of surgery will not be allowed to drive home.   Special Instructions: Start using your eye drops prior to surgery as directed by your eye doctor.   Please read over the following fact sheets that you were given: Anesthesia Post-op Instructions and Care and Recovery After Surgery     Cataract Surgery  A cataract is a clouding of the lens of the eye. When a lens becomes cloudy, vision is reduced based on the degree and nature of the clouding. Surgery may be needed to improve vision. Surgery removes the cloudy lens and usually replaces it with a substitute lens (intraocular lens, IOL). LET YOUR EYE DOCTOR KNOW ABOUT:  Allergies to food or medicine.  Medicines taken including herbs, eyedrops, over-the-counter medicines, and creams.  Use of steroids (by mouth or creams).  Previous problems with anesthetics or numbing medicine.  History of bleeding problems or blood clots.  Previous surgery.  Other health problems, including diabetes and kidney problems.  Possibility of pregnancy, if this applies. RISKS AND COMPLICATIONS  Infection.  Inflammation of the eyeball (endophthalmitis) that can spread to both eyes (sympathetic ophthalmia).  Poor wound healing.  If an IOL is inserted, it can later fall out of proper position. This is very  uncommon.  Clouding of the part of your eye that holds an IOL in place. This is called an "after-cataract." These are uncommon, but easily treated. BEFORE THE PROCEDURE  Do not eat or drink anything except small amounts of water for 8 to 12 before your surgery, or as directed by your caregiver.  Unless you are told otherwise, continue any eyedrops you have been prescribed.  Talk to your primary caregiver about all other medicines that you take (both prescription and non-prescription). In some cases, you may need to stop or change medicines near the time of your surgery. This is most important if you are taking blood-thinning medicine.Do not stop medicines unless you are told to do so.  Arrange for someone to drive you to and from the procedure.  Do not put contact lenses in either eye on the day of your surgery. PROCEDURE There is more than one method for safely removing a cataract. Your doctor can explain the differences and help determine which is best for you. Phacoemulsification surgery is the most common form of cataract surgery.  An injection is given behind the eye or eyedrops are given to make this a painless procedure.  A small cut (incision) is made on the edge of the clear, dome-shaped surface that covers the front of the eye (cornea).  A tiny probe is painlessly inserted into the eye. This device gives off ultrasound waves that soften and break  up the cloudy center of the lens. This makes it easier for the cloudy lens to be removed by suction.  An IOL may be implanted.  The normal lens of the eye is covered by a clear capsule. Part of that capsule is intentionally left in the eye to support the IOL.  Your surgeon may or may not use stitches to close the incision. There are other forms of cataract surgery that require a larger incision and stiches to close the eye. This approach is taken in cases where the doctor feels that the cataract cannot be easily removed using  phacoemulsification. AFTER THE PROCEDURE  When an IOL is implanted, it does not need care. It becomes a permanent part of your eye and cannot be seen or felt.  Your doctor will schedule follow-up exams to check on your progress.  Review your other medicines with your doctor to see which can be resumed after surgery.  Use eyedrops or take medicine as prescribed by your doctor. Document Released: 01/26/2011 Document Revised: 05/01/2011 Document Reviewed: 01/26/2011 [facility] Patient Information 2013 [place].    PATIENT INSTRUCTIONS POST-ANESTHESIA  IMMEDIATELY FOLLOWING SURGERY:  Do not drive or operate machinery for the first twenty four hours after surgery.  Do not make any important decisions for twenty four hours after surgery or while taking narcotic pain medications or sedatives.  If you develop intractable nausea and vomiting or a severe headache please notify your doctor immediately.  FOLLOW-UP:  Please make an appointment with your surgeon as instructed. You do not need to follow up with anesthesia unless specifically instructed to do so.  WOUND CARE INSTRUCTIONS (if applicable):  Keep a dry clean dressing on the anesthesia/puncture wound site if there is drainage.  Once the wound has quit draining you may leave it open to air.  Generally you should leave the bandage intact for twenty four hours unless there is drainage.  If the epidural site drains for more than 36-48 hours please call the anesthesia department.  QUESTIONS?:  Please feel free to call your physician or the hospital operator if you have any questions, and they will be happy to assist you.

## 2014-07-24 ENCOUNTER — Encounter (HOSPITAL_COMMUNITY): Admission: RE | Admit: 2014-07-24 | Payer: Medicare Other | Source: Ambulatory Visit

## 2014-07-24 ENCOUNTER — Encounter (HOSPITAL_COMMUNITY)
Admission: RE | Admit: 2014-07-24 | Discharge: 2014-07-24 | Disposition: A | Discharge status: Home / Self Care | Payer: Medicare Other | Source: Ambulatory Visit | Attending: Ophthalmology | Admitting: Ophthalmology

## 2014-07-24 ENCOUNTER — Encounter (HOSPITAL_COMMUNITY): Payer: Self-pay

## 2014-07-24 DIAGNOSIS — I4891 Unspecified atrial fibrillation: Secondary | ICD-10-CM | POA: Diagnosis not present

## 2014-07-24 DIAGNOSIS — Z7901 Long term (current) use of anticoagulants: Secondary | ICD-10-CM | POA: Diagnosis not present

## 2014-07-24 DIAGNOSIS — H2512 Age-related nuclear cataract, left eye: Secondary | ICD-10-CM | POA: Diagnosis not present

## 2014-07-24 DIAGNOSIS — E119 Type 2 diabetes mellitus without complications: Secondary | ICD-10-CM | POA: Diagnosis not present

## 2014-07-24 LAB — BASIC METABOLIC PANEL
ANION GAP: 5 (ref 5–15)
BUN: 20 mg/dL (ref 6–20)
CHLORIDE: 104 mmol/L (ref 101–111)
CO2: 29 mmol/L (ref 22–32)
CREATININE: 0.75 mg/dL (ref 0.44–1.00)
Calcium: 9.4 mg/dL (ref 8.9–10.3)
GFR calc non Af Amer: >60 mL/min (ref >60)
Glucose, Bld: 105 mg/dL — ABNORMAL HIGH (ref 65–99)
Potassium: 4.7 mmol/L (ref 3.5–5.1)
SODIUM: 138 mmol/L (ref 135–145)

## 2014-07-24 LAB — CBC
HCT: 40.8 % (ref 36.0–46.0)
Hemoglobin: 13.2 g/dL (ref 12.0–15.0)
MCH: 29.1 pg (ref 26.0–34.0)
MCHC: 32.4 g/dL (ref 30.0–36.0)
MCV: 90.1 fL (ref 78.0–100.0)
PLATELETS: 226 10*3/uL (ref 150–400)
RBC: 4.53 MIL/uL (ref 3.87–5.11)
RDW: 14 % (ref 11.5–15.5)
WBC: 8.8 10*3/uL (ref 4.0–10.5)

## 2014-07-24 MED ORDER — LIDOCAINE HCL 3.5 % OP GEL
OPHTHALMIC | Status: AC
  Filled 2014-07-24: qty 1

## 2014-07-24 MED ORDER — CYCLOPENTOLATE-PHENYLEPHRINE OP SOLN OPTIME - NO CHARGE
OPHTHALMIC | Status: AC
  Filled 2014-07-24: qty 2

## 2014-07-24 MED ORDER — TETRACAINE HCL 0.5 % OP SOLN
OPHTHALMIC | Status: AC
  Filled 2014-07-24: qty 2

## 2014-07-27 ENCOUNTER — Ambulatory Visit (HOSPITAL_COMMUNITY)
Admission: RE | Admit: 2014-07-27 | Discharge: 2014-07-27 | Disposition: A | Discharge status: Home / Self Care | Payer: Medicare Other | Source: Ambulatory Visit | Attending: Ophthalmology | Admitting: Ophthalmology

## 2014-07-27 ENCOUNTER — Ambulatory Visit (HOSPITAL_COMMUNITY): Payer: Medicare Other | Admitting: Anesthesiology

## 2014-07-27 ENCOUNTER — Encounter (HOSPITAL_COMMUNITY)
Admission: RE | Disposition: A | Discharge status: Home / Self Care | Payer: Self-pay | Source: Ambulatory Visit | Attending: Ophthalmology

## 2014-07-27 DIAGNOSIS — Z7901 Long term (current) use of anticoagulants: Secondary | ICD-10-CM | POA: Insufficient documentation

## 2014-07-27 DIAGNOSIS — H538 Other visual disturbances: Secondary | ICD-10-CM | POA: Diagnosis not present

## 2014-07-27 DIAGNOSIS — I4891 Unspecified atrial fibrillation: Secondary | ICD-10-CM | POA: Diagnosis not present

## 2014-07-27 DIAGNOSIS — H2512 Age-related nuclear cataract, left eye: Secondary | ICD-10-CM | POA: Insufficient documentation

## 2014-07-27 DIAGNOSIS — E119 Type 2 diabetes mellitus without complications: Secondary | ICD-10-CM | POA: Diagnosis not present

## 2014-07-27 DIAGNOSIS — H259 Unspecified age-related cataract: Secondary | ICD-10-CM | POA: Diagnosis not present

## 2014-07-27 HISTORY — PX: CATARACT EXTRACTION W/PHACO: SHX586

## 2014-07-27 LAB — GLUCOSE, CAPILLARY: GLUCOSE-CAPILLARY: 111 mg/dL — AB (ref 65–99)

## 2014-07-27 SURGERY — PHACOEMULSIFICATION, CATARACT, WITH IOL INSERTION
Anesthesia: Monitor Anesthesia Care | Laterality: Left

## 2014-07-27 MED ORDER — POVIDONE-IODINE 5 % OP SOLN
OPHTHALMIC | Status: DC | PRN
  Administered 2014-07-27: 1 via OPHTHALMIC

## 2014-07-27 MED ORDER — FENTANYL CITRATE (PF) 100 MCG/2ML IJ SOLN
INTRAMUSCULAR | Status: AC
  Filled 2014-07-27: qty 2

## 2014-07-27 MED ORDER — LIDOCAINE HCL 3.5 % OP GEL
OPHTHALMIC | Status: DC | PRN
  Administered 2014-07-27: 1 via OPHTHALMIC

## 2014-07-27 MED ORDER — LACTATED RINGERS IV SOLN
INTRAVENOUS | Status: DC
Start: 2014-07-27 — End: 2014-07-27
  Administered 2014-07-27: 1000 mL via INTRAVENOUS

## 2014-07-27 MED ORDER — PHENYLEPHRINE-KETOROLAC 1-0.3 % IO SOLN
INTRAOCULAR | Status: DC | PRN
  Administered 2014-07-27: 500 mL via OPHTHALMIC

## 2014-07-27 MED ORDER — TETRACAINE 0.5 % OP SOLN OPTIME - NO CHARGE
OPHTHALMIC | Status: DC | PRN
  Administered 2014-07-27: 2 [drp] via OPHTHALMIC

## 2014-07-27 MED ORDER — MIDAZOLAM HCL 2 MG/2ML IJ SOLN
INTRAMUSCULAR | Status: AC
  Filled 2014-07-27: qty 2

## 2014-07-27 MED ORDER — PHENYLEPHRINE-KETOROLAC 1-0.3 % IO SOLN
INTRAOCULAR | Status: AC
  Filled 2014-07-27: qty 4

## 2014-07-27 MED ORDER — CYCLOPENTOLATE-PHENYLEPHRINE 0.2-1 % OP SOLN
1.0000 [drp] | OPHTHALMIC | Status: AC
  Administered 2014-07-27 (×3): 1 [drp] via OPHTHALMIC

## 2014-07-27 MED ORDER — NA HYALUR & NA CHOND-NA HYALUR 0.55-0.5 ML IO KIT
PACK | INTRAOCULAR | Status: DC | PRN
Start: 2014-07-27 — End: 2014-07-27
  Administered 2014-07-27: 1 via OPHTHALMIC

## 2014-07-27 MED ORDER — LIDOCAINE HCL 3.5 % OP GEL: 1.0000 "application " | Freq: Once | OPHTHALMIC | Status: DC

## 2014-07-27 MED ORDER — FENTANYL CITRATE (PF) 100 MCG/2ML IJ SOLN
25.0000 ug | INTRAMUSCULAR | Status: AC
  Administered 2014-07-27: 25 ug via INTRAVENOUS

## 2014-07-27 MED ORDER — MIDAZOLAM HCL 2 MG/2ML IJ SOLN
1.0000 mg | INTRAMUSCULAR | Status: DC | PRN
  Administered 2014-07-27: 2 mg via INTRAVENOUS

## 2014-07-27 MED ORDER — BSS IO SOLN
INTRAOCULAR | Status: DC | PRN
  Administered 2014-07-27: 15 mL

## 2014-07-27 MED ORDER — TETRACAINE HCL 0.5 % OP SOLN
1.0000 [drp] | OPHTHALMIC | Status: AC
  Administered 2014-07-27 (×3): 1 [drp] via OPHTHALMIC

## 2014-07-27 SURGICAL SUPPLY — 7 items
CLOTH BEACON ORANGE TIMEOUT ST (SAFETY) ×2 IMPLANT
GLOVE BIOGEL PI IND STRL 7.0 (GLOVE) ×2 IMPLANT
GLOVE BIOGEL PI INDICATOR 7.0 (GLOVE) ×2
INST SET CATARACT ~~LOC~~ (KITS) ×2 IMPLANT
LENS ALC ACRYL/TECN (Ophthalmic Related) ×2 IMPLANT
PAD ARMBOARD 7.5X6 YLW CONV (MISCELLANEOUS) ×2 IMPLANT
WATER STERILE IRR 250ML POUR (IV SOLUTION) ×2 IMPLANT

## 2014-07-27 NOTE — Transfer of Care (Signed)
Immediate Anesthesia Transfer of Care Note  Patient: Anne Wolf  Procedure(s) Performed: Procedure(s): CATARACT EXTRACTION PHACO AND INTRAOCULAR LENS PLACEMENT LEFT EYE CDE=6.97 (Left)  Patient Location: Short Stay  Anesthesia Type:MAC  Level of Consciousness: awake, alert , oriented and patient cooperative  Airway & Oxygen Therapy: Patient Spontanous Breathing  Post-op Assessment: Report given to RN, Post -op Vital signs reviewed and stable and Patient moving all extremities  Post vital signs: Reviewed and stable  Last Vitals:  Filed Vitals:   07/27/14 0920  BP: 117/50  Pulse:   Temp:   Resp: 12    Complications: No apparent anesthesia complications

## 2014-07-27 NOTE — Brief Op Note (Signed)
07/27/2014  10:23 AM  PATIENT:  Anne Wolf  81 y.o. female  PRE-OPERATIVE DIAGNOSIS:  nuclear cataract left eye, difficulty performing daily activities  POST-OPERATIVE DIAGNOSIS:  nuclear cataract left eye, difficulty performing daily activities  PROCEDURE:  Procedure(s): CATARACT EXTRACTION PHACO AND INTRAOCULAR LENS PLACEMENT LEFT EYE CDE=6.97  SURGEON:  Surgeon(s): Williams Che, MD  ASSISTANTS: Bonney Roussel, CST   ANESTHESIA STAFF: Anesthesiologist: Lerry Liner, MD CRNA: Charmaine Downs, CRNA  ANESTHESIA:   topical and MAC  REQUESTED LENS POWER: 16.5  LENS IMPLANT INFORMATION:  Alcon SN60WF  S/n [id number]  Exp 09/2017  CUMULATIVE DISSIPATED ENERGY:6.97  INDICATIONS:see office H&P indications  OP FINDINGS:dense NS  COMPLICATIONS:None  DICTATION #: none  PLAN OF CARE: as above / PTIENT DISPOSITION:  Short Stay

## 2014-07-27 NOTE — Op Note (Signed)
07/27/2014  11:22 AM  PATIENT:  Anne Wolf  81 y.o. female  PRE-OPERATIVE DIAGNOSIS:  nuclear cataract left eye, difficulty performing daily activities  POST-OPERATIVE DIAGNOSIS:  nuclear cataract left eye, difficulty performing daily activities  PROCEDURE:  Procedure(s): CATARACT EXTRACTION PHACO AND INTRAOCULAR LENS PLACEMENT LEFT EYE CDE=6.97  SURGEON:  Surgeon(s): Williams Che, MD  ASSISTANTS:   Bonney Roussel, CST   ANESTHESIA STAFF: Anesthesiologist: Lerry Liner, MD CRNA: Charmaine Downs, CRNA  ANESTHESIA:   topical and MAC  REQUESTED LENS POWER: 16.5  LENS IMPLANT INFORMATION: @ORIMPLANT @  CUMULATIVE DISSIPATED ENERGY:6.97  INDICATIONS:see office H&P for indications  OP FINDINGS:  Mod. Dense NS  COMPLICATIONS:None  PROCEDURE: The was brought to the operating room in good condition and had general anesthesia administered, then prepped and draped in the usual fashion for intraocular surgery. A 360 degree conjunctival peritomy was performed with bishop harmon forceps and iris scissors. The cornea was excised with a #11 blade. The ocular contents were removed with an evisceration spoon. The ocular interior was carefully stripped with a frazier suction until free of all pigment. Hemostasis was obtained with gentile pressure from a sterile test tube inserted into the posterior pole. A 51OA silicone ball implant Was placed with an implant inserter. Good tissue overlap was noted. The sclera was winged and sutured with 5-0 white vicryl interrupted sutures. The conjunctiva was closed with a running 5-0 white vicryl suture. Bacitracin Oph ung was placed onto the suture line and a silicone conformer placed. A retrobulbar marcaine injection was performed to insure postoperative comfort. The patient was extubated and transferred to PACU for recovery.

## 2014-07-27 NOTE — Discharge Instructions (Signed)
Anne Wolf 07/27/2014 Dr. Iona Hansen Post operative Instructions for Cataract Patients  These instructions are for Trudie Buckler and pertain to the operative eye.  1.  Resume your normal diet and previous oral medicines.  2. Your Follow-up appointment is at Dr. Iona Hansen' office in [place] on 03/29/2014 at 1:45pm.  3. You may leave the hospital when your driver is present and your nurse releases you.  4. Begin Pred Forte (prednisolone acetate 1%), Acular LS (ketorolac tromethamine .4%) and Gatifloxacin 0.5% eye drops; 1 drop each 4 times daily to operative eye. Begin 3 hours after discharge from Short Stay Unit.  Moxifloxacin 0.5% may be substituted for Gatifloxacin using the same instructions.  31. Page Dr. Iona Hansen via beeper [phone] for significant pain in or around operative eye that is not relieved by Tylenol.  6. If you took Plavix before surgery, restart it at the usual dose on the evening of surgery.  7. Wear dark glasses as necessary for excessive light sensitivity.  8. Do no forcefully rub you your operative eye.  9. Keep your operative eye dry for 1 week. You may gently clean your eyelids with a damp washcloth.  10. You may resume normal occupational activities in one week and resume driving as tolerated after the first post operative visit.  11. It is normal to have blurred vision and a scratchy sensation following surgery.  Dr. Iona Hansen: [phone] Monitored Anesthesia Care Monitored anesthesia care is an anesthesia service for a medical procedure. Anesthesia is the loss of the ability to feel pain. It is produced by medicines called anesthetics. It may affect a small area of your body (local anesthesia), a large area of your body (regional anesthesia), or your entire body (general anesthesia). The need for monitored anesthesia care depends your procedure, your condition, and the potential need for regional or general anesthesia. It is often provided during procedures where:   General  anesthesia may be needed if there are complications. This is because you need special care when you are under general anesthesia.   You will be under local or regional anesthesia. This is so that you are able to have higher levels of anesthesia if needed.   You will receive calming medicines (sedatives). This is especially the case if sedatives are given to put you in a semi-conscious state of relaxation (deep sedation). This is because the amount of sedative needed to produce this state can be hard to predict. Too much of a sedative can produce general anesthesia. Monitored anesthesia care is performed by one or more health care providers who have special training in all types of anesthesia. You will need to meet with these health care providers before your procedure. During this meeting, they will ask you about your medical history. They will also give you instructions to follow. (For example, you will need to stop eating and drinking before your procedure. You may also need to stop or change medicines you are taking.) During your procedure, your health care providers will stay with you. They will:   Watch your condition. This includes watching your blood pressure, breathing, and level of pain.   Diagnose and treat problems that occur.   Give medicines if they are needed. These may include calming medicines (sedatives) and anesthetics.   Make sure you are comfortable.  Having monitored anesthesia care does not necessarily mean that you will be under anesthesia. It does mean that your health care providers will be able to manage anesthesia if you need it or  if it occurs. It also means that you will be able to have a different type of anesthesia than you are having if you need it. When your procedure is complete, your health care providers will continue to watch your condition. They will make sure any medicines wear off before you are allowed to go home.  Document Released: 11/02/2004 Document  Revised: 06/23/2013 Document Reviewed: 03/20/2012 [facility] Patient Information 2015 [place], [place]. This information is not intended to replace advice given to you by your health care provider. Make sure you discuss any questions you have with your health care provider.

## 2014-07-27 NOTE — H&P (Signed)
I have reviewed the pre printed H&P, the patient was re-examined, and I have identified no significant interval changes in the patient's medical condition.  There is no change in the plan of care since the history and physical of record. 

## 2014-07-27 NOTE — Anesthesia Postprocedure Evaluation (Signed)
  Anesthesia Post-op Note  Patient: Anne Wolf  Procedure(s) Performed: Procedure(s): CATARACT EXTRACTION PHACO AND INTRAOCULAR LENS PLACEMENT LEFT EYE CDE=6.97 (Left)  Patient Location: Short Stay  Anesthesia Type:MAC  Level of Consciousness: awake, alert , oriented and patient cooperative  Airway and Oxygen Therapy: Patient Spontanous Breathing  Post-op Pain: none  Post-op Assessment: Post-op Vital signs reviewed, Patient's Cardiovascular Status Stable, Respiratory Function Stable, Patent Airway and No signs of Nausea or vomiting  Post-op Vital Signs: Reviewed and stable  Last Vitals:  Filed Vitals:   07/27/14 0920  BP: 117/50  Pulse:   Temp:   Resp: 12    Complications: No apparent anesthesia complications

## 2014-07-27 NOTE — Anesthesia Preprocedure Evaluation (Signed)
Anesthesia Evaluation  Patient identified by MRN, date of birth, ID band Patient awake    Reviewed: Allergy & Precautions, NPO status , Patient's Chart, lab work & pertinent test results  Airway Mallampati: II  TM Distance: >3 FB     Dental  (+) Teeth Intact, Implants   Pulmonary  breath sounds clear to auscultation        Cardiovascular + dysrhythmias Atrial Fibrillation + Valvular Problems/Murmurs AS and MR Rhythm:Irregular Rate:Normal     Neuro/Psych    GI/Hepatic   Endo/Other  diabetes, Well Controlled, Type 2, Oral Hypoglycemic Agents  Renal/GU      Musculoskeletal   Abdominal   Peds  Hematology   Anesthesia Other Findings   Reproductive/Obstetrics                             Anesthesia Physical Anesthesia Plan  ASA: III  Anesthesia Plan: MAC   Post-op Pain Management:    Induction: Intravenous  Airway Management Planned: Nasal Cannula  Additional Equipment:   Intra-op Plan:   Post-operative Plan:   Informed Consent: I have reviewed the patients History and Physical, chart, labs and discussed the procedure including the risks, benefits and alternatives for the proposed anesthesia with the patient or authorized representative who has indicated his/her understanding and acceptance.     Plan Discussed with:   Anesthesia Plan Comments:         Anesthesia Quick Evaluation

## 2014-07-27 NOTE — Op Note (Deleted)
oll Karel Jarvis, MD Physician Signed Ophthalmology Service date: 07/27/2014 10:22 AM  Addend Delete Bookmark Copy      Expand All Collapse All   07/27/2014  10:23 AM  PATIENT: Anne Wolf 81 y.o. female  PRE-OPERATIVE DIAGNOSIS: nuclear cataract left eye, difficulty performing daily activities  POST-OPERATIVE DIAGNOSIS: nuclear cataract left eye, difficulty performing daily activities  PROCEDURE: Procedure(s):  CATARACT EXTRACTION PHACO AND INTRAOCULAR LENS PLACEMENT LEFT EYE CDE=6.97  SURGEON: Surgeon(s):  Williams Che, MD  ASSISTANTS: Bonney Roussel, CST  ANESTHESIA STAFF: Anesthesiologist: Lerry Liner, MD  CRNA: Charmaine Downs, CRNA  ANESTHESIA: topical and MAC  REQUESTED LENS POWER: 16.5  LENS IMPLANT INFORMATION: Alcon SN60WF S/n [id number] Exp 09/2017  CUMULATIVE DISSIPATED ENERGY:6.97  INDICATIONS:see office H&P indications  OP FINDINGS:dense NS  COMPLICATIONS:None  PROCEDURE: The was brought to the operating room in good condition and had general anesthesia administered, then prepped and draped in the usual fashion for intraocular surgery. A 360 degree conjunctival peritomy was performed with bishop harmon forceps and iris scissors. The cornea was excised with a #11 blade. The ocular contents were removed with an evisceration spoon. The ocular interior was carefully stripped with a frazier suction until free of all pigment. Hemostasis was obtained with gentile pressure from a sterile test tube inserted into the posterior pole. A 09MM silicone ball implant Was placed with an implant inserter. Good tissue overlap was noted. The sclera was winged and sutured with 5-0 white vicryl interrupted sutures. The conjunctiva was closed with a running 5-0 white vicryl suture. Bacitracin Oph ung was placed onto the suture line and a silicone conformer placed. A retrobulbar marcaine injection was performed to insure postoperative comfort. The patient was extubated and transferred to PACU for  recovery.

## 2014-07-28 ENCOUNTER — Encounter (HOSPITAL_COMMUNITY): Payer: Self-pay | Admitting: Ophthalmology

## 2014-08-03 ENCOUNTER — Ambulatory Visit (INDEPENDENT_AMBULATORY_CARE_PROVIDER_SITE_OTHER): Payer: Medicare Other | Admitting: *Deleted

## 2014-08-03 DIAGNOSIS — I4891 Unspecified atrial fibrillation: Secondary | ICD-10-CM | POA: Diagnosis not present

## 2014-08-03 DIAGNOSIS — Z7901 Long term (current) use of anticoagulants: Secondary | ICD-10-CM

## 2014-08-03 DIAGNOSIS — Z5181 Encounter for therapeutic drug level monitoring: Secondary | ICD-10-CM

## 2014-08-03 LAB — POCT INR: INR: 2.2

## 2014-08-10 DIAGNOSIS — E785 Hyperlipidemia, unspecified: Secondary | ICD-10-CM | POA: Diagnosis not present

## 2014-08-10 DIAGNOSIS — Z79899 Other long term (current) drug therapy: Secondary | ICD-10-CM | POA: Diagnosis not present

## 2014-08-10 DIAGNOSIS — I4891 Unspecified atrial fibrillation: Secondary | ICD-10-CM | POA: Diagnosis not present

## 2014-08-10 DIAGNOSIS — E119 Type 2 diabetes mellitus without complications: Secondary | ICD-10-CM | POA: Diagnosis not present

## 2014-08-27 DIAGNOSIS — I482 Chronic atrial fibrillation: Secondary | ICD-10-CM | POA: Diagnosis not present

## 2014-08-27 DIAGNOSIS — E1129 Type 2 diabetes mellitus with other diabetic kidney complication: Secondary | ICD-10-CM | POA: Diagnosis not present

## 2014-08-27 DIAGNOSIS — Z0001 Encounter for general adult medical examination with abnormal findings: Secondary | ICD-10-CM | POA: Diagnosis not present

## 2014-08-27 DIAGNOSIS — E785 Hyperlipidemia, unspecified: Secondary | ICD-10-CM | POA: Diagnosis not present

## 2014-09-21 ENCOUNTER — Ambulatory Visit (INDEPENDENT_AMBULATORY_CARE_PROVIDER_SITE_OTHER): Payer: Medicare Other | Admitting: Cardiology

## 2014-09-21 ENCOUNTER — Encounter: Payer: Self-pay | Admitting: Cardiology

## 2014-09-21 ENCOUNTER — Ambulatory Visit (INDEPENDENT_AMBULATORY_CARE_PROVIDER_SITE_OTHER): Payer: Medicare Other | Admitting: *Deleted

## 2014-09-21 VITALS — BP 122/66 | HR 92 | Ht 62.0 in | Wt 177.0 lb

## 2014-09-21 DIAGNOSIS — I4891 Unspecified atrial fibrillation: Secondary | ICD-10-CM

## 2014-09-21 DIAGNOSIS — Z7901 Long term (current) use of anticoagulants: Secondary | ICD-10-CM | POA: Diagnosis not present

## 2014-09-21 DIAGNOSIS — I350 Nonrheumatic aortic (valve) stenosis: Secondary | ICD-10-CM

## 2014-09-21 DIAGNOSIS — Z5181 Encounter for therapeutic drug level monitoring: Secondary | ICD-10-CM

## 2014-09-21 LAB — POCT INR: INR: 3

## 2014-09-21 NOTE — Progress Notes (Signed)
Patient ID: Anne Wolf, female   DOB: 02/05/1934, 81 y.o.   MRN: [id number]     Clinical Summary [name] is a 81 y.o.female seen today for follow up of the following medical problems.   1. Afib - denies any palpitations - compliant with coumadin, she has not had any issues with bleeding  2. Hyperlipidemia - compliant with statin  3. Aortic stenosis -  Mild to moderate by echo 10/2013. Mean grad 11, ava 1.24 - denies any chest pain, SOB/DOE, or syncope.   Past Medical History  Diagnosis Date  . Atrial fibrillation     chronic anticoagulation; adequate HR control on minimal AV nodal blocking medication   . Aortic stenosis     mild; mild MR; slightly increased pulmonary artery pressure with mild RVH; normal LV-2010  . Hyperlipidemia   . Fasting hyperglycemia     + microalbuminuria; A1c of 6.5% in 11/2010  . Hematochezia 01/2009    01/2009-presumed ischemic colitis; history of diverticulosis  . Hx of adenomatous colonic polyps 2005    adenomatous polyp; negative colonoscopy in 2009  . Dysrhythmia     A FIB  . Diabetes mellitus without complication      No Known Allergies   Current Outpatient Prescriptions  Medication Sig Dispense Refill  . Co-Enzyme Q-10 100 MG CAPS Take 1 capsule by mouth daily.      Marland Kitchen lisinopril (PRINIVIL,ZESTRIL) 10 MG tablet Take 10 mg by mouth daily.      . metFORMIN (GLUCOPHAGE) 500 MG tablet Take 500 mg by mouth 2 (two) times daily with a meal.     . metoprolol succinate (TOPROL-XL) 25 MG 24 hr tablet Take 25 mg by mouth daily.      . Multiple Vitamins-Minerals (CENTRUM SILVER) tablet Take 1 tablet by mouth daily.      . simvastatin (ZOCOR) 40 MG tablet Take 40 mg by mouth at bedtime.      Marland Kitchen warfarin (COUMADIN) 2 MG tablet 1 tablet daily except 1/2 tablets on Tuesdays 100 tablet 3   No current facility-administered medications for this visit.     Past Surgical History  Procedure Laterality Date  . Colonoscopy  2009    negative; prior  study with adenomatous polyp  . Cataract extraction w/phaco Right 03/23/2014    Procedure: CATARACT EXTRACTION PHACO AND INTRAOCULAR LENS PLACEMENT (IOC);  Surgeon: Williams Che, MD;  Location: [facility];  Service: Ophthalmology;  Laterality: Right;  CDE:2.83  . Cataract extraction w/phaco Left 07/27/2014    Procedure: CATARACT EXTRACTION PHACO AND INTRAOCULAR LENS PLACEMENT LEFT EYE CDE=6.97;  Surgeon: Williams Che, MD;  Location: [facility];  Service: Ophthalmology;  Laterality: Left;     No Known Allergies    Family History  Problem Relation Age of Onset  . Cancer Father   . Arrhythmia Sister     Atrial fibrillation     Social History [name] reports that she has never smoked. She has never used smokeless tobacco. [name] reports that she does not drink alcohol.   Review of Systems CONSTITUTIONAL: No weight loss, fever, chills, weakness or fatigue.  HEENT: Eyes: No visual loss, blurred vision, double vision or yellow sclerae.No hearing loss, sneezing, congestion, runny nose or sore throat.  SKIN: No rash or itching.  CARDIOVASCULAR: per HPI RESPIRATORY: No shortness of breath, cough or sputum.  GASTROINTESTINAL: No anorexia, nausea, vomiting or diarrhea. No abdominal pain or blood.  GENITOURINARY: No burning on urination, no polyuria NEUROLOGICAL: No headache, dizziness,  syncope, paralysis, ataxia, numbness or tingling in the extremities. No change in bowel or bladder control.  MUSCULOSKELETAL: No muscle, back pain, joint pain or stiffness.  LYMPHATICS: No enlarged nodes. No history of splenectomy.  PSYCHIATRIC: No history of depression or anxiety.  ENDOCRINOLOGIC: No reports of sweating, cold or heat intolerance. No polyuria or polydipsia.  Marland Kitchen   Physical Examination Filed Vitals:   09/21/14 0902  BP: 122/66  Pulse: 92   Filed Vitals:   09/21/14 0902  Height: 5\' 2"  (1.575 m)  Weight: 177 lb (80.287 kg)    Gen: resting comfortably, no acute distress HEENT: no  scleral icterus, pupils equal round and reactive, no palptable cervical adenopathy,  CV: RRR, no m/r/g, no JVD Resp: Clear to auscultation bilaterally GI: abdomen is soft, non-tender, non-distended, normal bowel sounds, no hepatosplenomegaly MSK: extremities are warm, no edema.  Skin: warm, no rash Neuro:  no focal deficits Psych: appropriate affect   Diagnostic Studies 10/2013 Echo Study Conclusions  - Left ventricle: The cavity size was normal. Wall thickness was increased in a pattern of moderate LVH. Systolic function was normal. The estimated ejection fraction was in the range of 55% to 60%. Wall motion was normal; there were no regional wall motion abnormalities. Doppler parameters are consistent with elevated ventricular end-diastolic filling pressure. - Aortic valve: Mildly calcified annulus. Trileaflet; mildly to moderately calcified leaflets. Cusp separation was mildly reduced. There was mild to moderate stenosis. There was mild regurgitation. Mean gradient (S): 11 mm Hg. Peak gradient (S): 23 mm Hg. VTI ratio of LVOT to aortic valve: 0.4. Valve area (VTI): 1.24 cm^2. Valve area (Vmax): 1.32 cm^2. Planimetry valve area 1.8 square cm. - Mitral valve: Calcified annulus. There was mild regurgitation. Valve area by pressure half-time: 2.2 cm^2. - Left atrium: The atrium was severely dilated. - Right atrium: The atrium was severely dilated. Central venous pressure (est): 3 mm Hg. - Atrial septum: No defect or patent foramen ovale was identified. - Tricuspid valve: There was mild regurgitation. - Pulmonary arteries: PA peak pressure: 35 mm Hg (S). - Pericardium, extracardiac: There was no pericardial effusion.  Impressions:  - Moderate LVH with LVEF 55-60%, indeterminate diastolic function with evidence of increased LVEDP. Severe biatrial enlargement. Moderate MAC with mild mitral regurgitation. Mild to moderate, calcific aortic stenosis as  outlined above with mild aortic regurgitation. Mild tricuspid regurgitation with PASP 35 mmHg.     Assessment and Plan    1. Afib - no current symptoms, continue current meds - we have discussed NOACs, she elects to stay on coumadin for now  2. Hyperlipidemia - request recent panel from pcp - continue statin  3. Aortic stenosis - mild to moderate by echo, no current symptoms. Continue to follow cliniclally, likely repeat echo 08/2015.    F/u 1 year   Arnoldo Lenis, M.D.

## 2014-09-21 NOTE — Patient Instructions (Signed)
Your physician wants you to follow-up in: 1 year with DrBranch You will receive a reminder letter in the mail two months in advance. If you don't receive a letter, please call our office to schedule the follow-up appointment.     Your physician recommends that you continue on your current medications as directed. Please refer to the Current Medication list given to you today.      Thank you for choosing [facility] !        

## 2014-10-29 ENCOUNTER — Other Ambulatory Visit: Payer: Self-pay

## 2014-10-29 DIAGNOSIS — Z1231 Encounter for screening mammogram for malignant neoplasm of breast: Secondary | ICD-10-CM

## 2014-11-02 ENCOUNTER — Ambulatory Visit (INDEPENDENT_AMBULATORY_CARE_PROVIDER_SITE_OTHER): Payer: Medicare Other | Admitting: *Deleted

## 2014-11-02 DIAGNOSIS — Z5181 Encounter for therapeutic drug level monitoring: Secondary | ICD-10-CM

## 2014-11-02 DIAGNOSIS — Z7901 Long term (current) use of anticoagulants: Secondary | ICD-10-CM | POA: Diagnosis not present

## 2014-11-02 DIAGNOSIS — I4891 Unspecified atrial fibrillation: Secondary | ICD-10-CM | POA: Diagnosis not present

## 2014-11-02 LAB — POCT INR: INR: 2.4

## 2014-11-16 ENCOUNTER — Ambulatory Visit
Admission: RE | Admit: 2014-11-16 | Discharge: 2014-11-16 | Disposition: A | Discharge status: Home / Self Care | Payer: Medicare Other | Source: Ambulatory Visit

## 2014-11-16 DIAGNOSIS — Z1231 Encounter for screening mammogram for malignant neoplasm of breast: Secondary | ICD-10-CM

## 2014-12-14 ENCOUNTER — Ambulatory Visit (INDEPENDENT_AMBULATORY_CARE_PROVIDER_SITE_OTHER): Payer: Medicare Other | Admitting: *Deleted

## 2014-12-14 DIAGNOSIS — Z5181 Encounter for therapeutic drug level monitoring: Secondary | ICD-10-CM

## 2014-12-14 DIAGNOSIS — Z7901 Long term (current) use of anticoagulants: Secondary | ICD-10-CM | POA: Diagnosis not present

## 2014-12-14 DIAGNOSIS — I4891 Unspecified atrial fibrillation: Secondary | ICD-10-CM

## 2014-12-14 LAB — POCT INR: INR: 2.9

## 2014-12-29 DIAGNOSIS — E119 Type 2 diabetes mellitus without complications: Secondary | ICD-10-CM | POA: Diagnosis not present

## 2015-01-05 DIAGNOSIS — Z6832 Body mass index (BMI) 32.0-32.9, adult: Secondary | ICD-10-CM | POA: Diagnosis not present

## 2015-01-05 DIAGNOSIS — E1129 Type 2 diabetes mellitus with other diabetic kidney complication: Secondary | ICD-10-CM | POA: Diagnosis not present

## 2015-01-05 DIAGNOSIS — I482 Chronic atrial fibrillation: Secondary | ICD-10-CM | POA: Diagnosis not present

## 2015-01-27 ENCOUNTER — Ambulatory Visit (INDEPENDENT_AMBULATORY_CARE_PROVIDER_SITE_OTHER): Payer: Medicare Other | Admitting: *Deleted

## 2015-01-27 DIAGNOSIS — Z7901 Long term (current) use of anticoagulants: Secondary | ICD-10-CM

## 2015-01-27 DIAGNOSIS — Z5181 Encounter for therapeutic drug level monitoring: Secondary | ICD-10-CM | POA: Diagnosis not present

## 2015-01-27 DIAGNOSIS — I4891 Unspecified atrial fibrillation: Secondary | ICD-10-CM | POA: Diagnosis not present

## 2015-01-27 LAB — POCT INR: INR: 2.3

## 2015-03-22 ENCOUNTER — Ambulatory Visit (INDEPENDENT_AMBULATORY_CARE_PROVIDER_SITE_OTHER): Payer: Medicare Other | Admitting: *Deleted

## 2015-03-22 DIAGNOSIS — Z7901 Long term (current) use of anticoagulants: Secondary | ICD-10-CM | POA: Diagnosis not present

## 2015-03-22 DIAGNOSIS — Z5181 Encounter for therapeutic drug level monitoring: Secondary | ICD-10-CM | POA: Diagnosis not present

## 2015-03-22 DIAGNOSIS — I480 Paroxysmal atrial fibrillation: Secondary | ICD-10-CM | POA: Diagnosis not present

## 2015-03-22 DIAGNOSIS — I4891 Unspecified atrial fibrillation: Secondary | ICD-10-CM | POA: Diagnosis not present

## 2015-03-22 LAB — POCT INR: INR: 3.2

## 2015-03-25 DIAGNOSIS — Z1283 Encounter for screening for malignant neoplasm of skin: Secondary | ICD-10-CM | POA: Diagnosis not present

## 2015-03-25 DIAGNOSIS — Z85828 Personal history of other malignant neoplasm of skin: Secondary | ICD-10-CM | POA: Diagnosis not present

## 2015-03-25 DIAGNOSIS — Z08 Encounter for follow-up examination after completed treatment for malignant neoplasm: Secondary | ICD-10-CM | POA: Diagnosis not present

## 2015-04-19 ENCOUNTER — Ambulatory Visit (INDEPENDENT_AMBULATORY_CARE_PROVIDER_SITE_OTHER): Payer: Medicare Other | Admitting: *Deleted

## 2015-04-19 DIAGNOSIS — I4891 Unspecified atrial fibrillation: Secondary | ICD-10-CM

## 2015-04-19 DIAGNOSIS — Z7901 Long term (current) use of anticoagulants: Secondary | ICD-10-CM | POA: Diagnosis not present

## 2015-04-19 DIAGNOSIS — Z5181 Encounter for therapeutic drug level monitoring: Secondary | ICD-10-CM | POA: Diagnosis not present

## 2015-04-19 LAB — POCT INR: INR: 2.8

## 2015-05-06 DIAGNOSIS — E119 Type 2 diabetes mellitus without complications: Secondary | ICD-10-CM | POA: Diagnosis not present

## 2015-05-13 DIAGNOSIS — I482 Chronic atrial fibrillation: Secondary | ICD-10-CM | POA: Diagnosis not present

## 2015-05-13 DIAGNOSIS — E1129 Type 2 diabetes mellitus with other diabetic kidney complication: Secondary | ICD-10-CM | POA: Diagnosis not present

## 2015-06-02 ENCOUNTER — Ambulatory Visit (INDEPENDENT_AMBULATORY_CARE_PROVIDER_SITE_OTHER): Payer: Medicare Other | Admitting: *Deleted

## 2015-06-02 DIAGNOSIS — I4891 Unspecified atrial fibrillation: Secondary | ICD-10-CM | POA: Diagnosis not present

## 2015-06-02 DIAGNOSIS — Z5181 Encounter for therapeutic drug level monitoring: Secondary | ICD-10-CM

## 2015-06-02 DIAGNOSIS — Z7901 Long term (current) use of anticoagulants: Secondary | ICD-10-CM

## 2015-06-02 LAB — POCT INR: INR: 2.5

## 2015-06-18 ENCOUNTER — Other Ambulatory Visit: Payer: Self-pay

## 2015-06-18 MED ORDER — WARFARIN SODIUM 2 MG PO TABS: ORAL_TABLET | ORAL | Status: DC

## 2015-06-30 DIAGNOSIS — S0083XA Contusion of other part of head, initial encounter: Secondary | ICD-10-CM | POA: Diagnosis not present

## 2015-06-30 DIAGNOSIS — I482 Chronic atrial fibrillation: Secondary | ICD-10-CM | POA: Diagnosis not present

## 2015-06-30 DIAGNOSIS — T63481A Toxic effect of venom of other arthropod, accidental (unintentional), initial encounter: Secondary | ICD-10-CM | POA: Diagnosis not present

## 2015-06-30 DIAGNOSIS — R319 Hematuria, unspecified: Secondary | ICD-10-CM | POA: Diagnosis not present

## 2015-07-01 DIAGNOSIS — I482 Chronic atrial fibrillation: Secondary | ICD-10-CM | POA: Diagnosis not present

## 2015-07-01 DIAGNOSIS — S0006XA Insect bite (nonvenomous) of scalp, initial encounter: Secondary | ICD-10-CM | POA: Diagnosis not present

## 2015-07-01 DIAGNOSIS — T63481A Toxic effect of venom of other arthropod, accidental (unintentional), initial encounter: Secondary | ICD-10-CM | POA: Diagnosis not present

## 2015-07-08 DIAGNOSIS — S0006XA Insect bite (nonvenomous) of scalp, initial encounter: Secondary | ICD-10-CM | POA: Diagnosis not present

## 2015-07-12 ENCOUNTER — Ambulatory Visit (INDEPENDENT_AMBULATORY_CARE_PROVIDER_SITE_OTHER): Payer: Medicare Other | Admitting: *Deleted

## 2015-07-12 DIAGNOSIS — I4891 Unspecified atrial fibrillation: Secondary | ICD-10-CM

## 2015-07-12 DIAGNOSIS — Z7901 Long term (current) use of anticoagulants: Secondary | ICD-10-CM

## 2015-07-12 DIAGNOSIS — Z5181 Encounter for therapeutic drug level monitoring: Secondary | ICD-10-CM | POA: Diagnosis not present

## 2015-07-12 LAB — POCT INR: INR: 2.6

## 2015-08-23 ENCOUNTER — Ambulatory Visit (INDEPENDENT_AMBULATORY_CARE_PROVIDER_SITE_OTHER): Payer: Medicare Other | Admitting: *Deleted

## 2015-08-23 DIAGNOSIS — I4891 Unspecified atrial fibrillation: Secondary | ICD-10-CM | POA: Diagnosis not present

## 2015-08-23 DIAGNOSIS — Z5181 Encounter for therapeutic drug level monitoring: Secondary | ICD-10-CM | POA: Diagnosis not present

## 2015-08-23 DIAGNOSIS — E119 Type 2 diabetes mellitus without complications: Secondary | ICD-10-CM | POA: Diagnosis not present

## 2015-08-23 DIAGNOSIS — M810 Age-related osteoporosis without current pathological fracture: Secondary | ICD-10-CM | POA: Diagnosis not present

## 2015-08-23 DIAGNOSIS — Z7901 Long term (current) use of anticoagulants: Secondary | ICD-10-CM

## 2015-08-23 DIAGNOSIS — Z79899 Other long term (current) drug therapy: Secondary | ICD-10-CM | POA: Diagnosis not present

## 2015-08-23 LAB — POCT INR: INR: 2.9

## 2015-08-31 ENCOUNTER — Other Ambulatory Visit (HOSPITAL_COMMUNITY): Payer: Self-pay | Admitting: Internal Medicine

## 2015-08-31 ENCOUNTER — Telehealth: Payer: Self-pay

## 2015-08-31 DIAGNOSIS — Z0001 Encounter for general adult medical examination with abnormal findings: Secondary | ICD-10-CM | POA: Diagnosis not present

## 2015-08-31 DIAGNOSIS — E1129 Type 2 diabetes mellitus with other diabetic kidney complication: Secondary | ICD-10-CM | POA: Diagnosis not present

## 2015-08-31 DIAGNOSIS — Z780 Asymptomatic menopausal state: Secondary | ICD-10-CM

## 2015-08-31 DIAGNOSIS — I482 Chronic atrial fibrillation: Secondary | ICD-10-CM | POA: Diagnosis not present

## 2015-08-31 NOTE — Telephone Encounter (Signed)
Noted  

## 2015-08-31 NOTE — Telephone Encounter (Signed)
Pt received letter to be triaged, Please call [phone]

## 2015-08-31 NOTE — Addendum Note (Signed)
Addended by: Marlou Porch on: 08/31/2015 03:14 PM   Modules accepted: Medications

## 2015-08-31 NOTE — Telephone Encounter (Signed)
Pt is going out of town and would like to wait until she gets back. She will be back in town on 10/02/12. I have a spot held for her on 10/13/15 @ 7:30 for RMR

## 2015-09-06 ENCOUNTER — Ambulatory Visit (HOSPITAL_COMMUNITY)
Admission: RE | Admit: 2015-09-06 | Discharge: 2015-09-06 | Disposition: A | Discharge status: Home / Self Care | Payer: Medicare Other | Source: Ambulatory Visit | Attending: Internal Medicine | Admitting: Internal Medicine

## 2015-09-06 DIAGNOSIS — M8589 Other specified disorders of bone density and structure, multiple sites: Secondary | ICD-10-CM | POA: Diagnosis not present

## 2015-09-06 DIAGNOSIS — Z780 Asymptomatic menopausal state: Secondary | ICD-10-CM

## 2015-09-06 DIAGNOSIS — Z13820 Encounter for screening for osteoporosis: Secondary | ICD-10-CM | POA: Diagnosis not present

## 2015-09-06 DIAGNOSIS — M8588 Other specified disorders of bone density and structure, other site: Secondary | ICD-10-CM | POA: Diagnosis not present

## 2015-09-21 NOTE — Telephone Encounter (Signed)
LMOM to call back

## 2015-10-04 ENCOUNTER — Ambulatory Visit (INDEPENDENT_AMBULATORY_CARE_PROVIDER_SITE_OTHER): Payer: Medicare Other | Admitting: *Deleted

## 2015-10-04 DIAGNOSIS — I4891 Unspecified atrial fibrillation: Secondary | ICD-10-CM | POA: Diagnosis not present

## 2015-10-04 DIAGNOSIS — Z7901 Long term (current) use of anticoagulants: Secondary | ICD-10-CM

## 2015-10-04 DIAGNOSIS — Z5181 Encounter for therapeutic drug level monitoring: Secondary | ICD-10-CM | POA: Diagnosis not present

## 2015-10-04 LAB — POCT INR: INR: 3

## 2015-10-06 NOTE — Telephone Encounter (Signed)
Pt called back today and she is leaving to go out town again and she will have to call us back when she get into town. She is going to have to come into the office since she is on Coumadin

## 2015-11-09 DIAGNOSIS — J069 Acute upper respiratory infection, unspecified: Secondary | ICD-10-CM | POA: Diagnosis not present

## 2015-11-15 ENCOUNTER — Ambulatory Visit (INDEPENDENT_AMBULATORY_CARE_PROVIDER_SITE_OTHER): Payer: Medicare Other | Admitting: *Deleted

## 2015-11-15 DIAGNOSIS — Z7901 Long term (current) use of anticoagulants: Secondary | ICD-10-CM

## 2015-11-15 DIAGNOSIS — I4891 Unspecified atrial fibrillation: Secondary | ICD-10-CM | POA: Diagnosis not present

## 2015-11-15 DIAGNOSIS — Z5181 Encounter for therapeutic drug level monitoring: Secondary | ICD-10-CM | POA: Diagnosis not present

## 2015-11-15 LAB — POCT INR: INR: 2.5

## 2015-11-17 ENCOUNTER — Other Ambulatory Visit: Payer: Self-pay | Admitting: Internal Medicine

## 2015-11-17 DIAGNOSIS — Z1231 Encounter for screening mammogram for malignant neoplasm of breast: Secondary | ICD-10-CM

## 2015-11-29 ENCOUNTER — Ambulatory Visit
Admission: RE | Admit: 2015-11-29 | Discharge: 2015-11-29 | Disposition: A | Discharge status: Home / Self Care | Payer: Medicare Other | Source: Ambulatory Visit | Attending: Internal Medicine | Admitting: Internal Medicine

## 2015-11-29 DIAGNOSIS — Z1231 Encounter for screening mammogram for malignant neoplasm of breast: Secondary | ICD-10-CM | POA: Diagnosis not present

## 2015-12-24 DIAGNOSIS — Z23 Encounter for immunization: Secondary | ICD-10-CM | POA: Diagnosis not present

## 2015-12-27 ENCOUNTER — Ambulatory Visit (INDEPENDENT_AMBULATORY_CARE_PROVIDER_SITE_OTHER): Payer: Medicare Other | Admitting: *Deleted

## 2015-12-27 DIAGNOSIS — Z5181 Encounter for therapeutic drug level monitoring: Secondary | ICD-10-CM | POA: Diagnosis not present

## 2015-12-27 DIAGNOSIS — Z7901 Long term (current) use of anticoagulants: Secondary | ICD-10-CM | POA: Diagnosis not present

## 2015-12-27 DIAGNOSIS — I4891 Unspecified atrial fibrillation: Secondary | ICD-10-CM | POA: Diagnosis not present

## 2015-12-27 DIAGNOSIS — E119 Type 2 diabetes mellitus without complications: Secondary | ICD-10-CM | POA: Diagnosis not present

## 2015-12-27 LAB — POCT INR: INR: 2.1

## 2016-01-04 DIAGNOSIS — I482 Chronic atrial fibrillation: Secondary | ICD-10-CM | POA: Diagnosis not present

## 2016-01-04 DIAGNOSIS — E1129 Type 2 diabetes mellitus with other diabetic kidney complication: Secondary | ICD-10-CM | POA: Diagnosis not present

## 2016-02-07 ENCOUNTER — Ambulatory Visit (INDEPENDENT_AMBULATORY_CARE_PROVIDER_SITE_OTHER): Payer: Medicare Other | Admitting: *Deleted

## 2016-02-07 DIAGNOSIS — Z5181 Encounter for therapeutic drug level monitoring: Secondary | ICD-10-CM

## 2016-02-07 DIAGNOSIS — I4891 Unspecified atrial fibrillation: Secondary | ICD-10-CM

## 2016-02-07 DIAGNOSIS — Z7901 Long term (current) use of anticoagulants: Secondary | ICD-10-CM

## 2016-02-07 LAB — POCT INR: INR: 3.2

## 2016-03-08 DIAGNOSIS — Z961 Presence of intraocular lens: Secondary | ICD-10-CM | POA: Diagnosis not present

## 2016-03-08 DIAGNOSIS — B029 Zoster without complications: Secondary | ICD-10-CM | POA: Diagnosis not present

## 2016-03-08 DIAGNOSIS — H04123 Dry eye syndrome of bilateral lacrimal glands: Secondary | ICD-10-CM | POA: Diagnosis not present

## 2016-03-08 DIAGNOSIS — H01131 Eczematous dermatitis of right upper eyelid: Secondary | ICD-10-CM | POA: Diagnosis not present

## 2016-03-08 DIAGNOSIS — H26493 Other secondary cataract, bilateral: Secondary | ICD-10-CM | POA: Diagnosis not present

## 2016-03-20 ENCOUNTER — Ambulatory Visit (INDEPENDENT_AMBULATORY_CARE_PROVIDER_SITE_OTHER): Payer: Medicare Other | Admitting: *Deleted

## 2016-03-20 DIAGNOSIS — Z5181 Encounter for therapeutic drug level monitoring: Secondary | ICD-10-CM

## 2016-03-20 DIAGNOSIS — I4891 Unspecified atrial fibrillation: Secondary | ICD-10-CM

## 2016-03-20 DIAGNOSIS — Z7901 Long term (current) use of anticoagulants: Secondary | ICD-10-CM | POA: Diagnosis not present

## 2016-03-20 LAB — POCT INR: INR: 2.3

## 2016-03-22 DIAGNOSIS — Z961 Presence of intraocular lens: Secondary | ICD-10-CM | POA: Diagnosis not present

## 2016-03-22 DIAGNOSIS — H26493 Other secondary cataract, bilateral: Secondary | ICD-10-CM | POA: Diagnosis not present

## 2016-03-31 DIAGNOSIS — Z961 Presence of intraocular lens: Secondary | ICD-10-CM | POA: Diagnosis not present

## 2016-03-31 DIAGNOSIS — H26492 Other secondary cataract, left eye: Secondary | ICD-10-CM | POA: Diagnosis not present

## 2016-05-01 ENCOUNTER — Ambulatory Visit (INDEPENDENT_AMBULATORY_CARE_PROVIDER_SITE_OTHER): Payer: Medicare Other | Admitting: *Deleted

## 2016-05-01 DIAGNOSIS — Z5181 Encounter for therapeutic drug level monitoring: Secondary | ICD-10-CM | POA: Diagnosis not present

## 2016-05-01 DIAGNOSIS — E119 Type 2 diabetes mellitus without complications: Secondary | ICD-10-CM | POA: Diagnosis not present

## 2016-05-01 DIAGNOSIS — Z7901 Long term (current) use of anticoagulants: Secondary | ICD-10-CM | POA: Diagnosis not present

## 2016-05-01 DIAGNOSIS — I4891 Unspecified atrial fibrillation: Secondary | ICD-10-CM

## 2016-05-01 LAB — POCT INR: INR: 2

## 2016-05-09 DIAGNOSIS — R21 Rash and other nonspecific skin eruption: Secondary | ICD-10-CM | POA: Diagnosis not present

## 2016-05-09 DIAGNOSIS — L308 Other specified dermatitis: Secondary | ICD-10-CM | POA: Diagnosis not present

## 2016-05-09 DIAGNOSIS — L710 Perioral dermatitis: Secondary | ICD-10-CM | POA: Diagnosis not present

## 2016-05-09 DIAGNOSIS — N390 Urinary tract infection, site not specified: Secondary | ICD-10-CM | POA: Diagnosis not present

## 2016-05-09 DIAGNOSIS — L648 Other androgenic alopecia: Secondary | ICD-10-CM | POA: Diagnosis not present

## 2016-05-09 DIAGNOSIS — R310 Gross hematuria: Secondary | ICD-10-CM | POA: Diagnosis not present

## 2016-05-09 DIAGNOSIS — E119 Type 2 diabetes mellitus without complications: Secondary | ICD-10-CM | POA: Diagnosis not present

## 2016-05-15 ENCOUNTER — Other Ambulatory Visit (HOSPITAL_COMMUNITY): Payer: Self-pay | Admitting: Internal Medicine

## 2016-05-15 DIAGNOSIS — R319 Hematuria, unspecified: Secondary | ICD-10-CM

## 2016-05-17 ENCOUNTER — Ambulatory Visit (HOSPITAL_COMMUNITY)
Admission: RE | Admit: 2016-05-17 | Discharge: 2016-05-17 | Disposition: A | Discharge status: Home / Self Care | Payer: Medicare Other | Source: Ambulatory Visit | Attending: Internal Medicine | Admitting: Internal Medicine

## 2016-05-17 DIAGNOSIS — R319 Hematuria, unspecified: Secondary | ICD-10-CM

## 2016-05-17 DIAGNOSIS — I700 Atherosclerosis of aorta: Secondary | ICD-10-CM | POA: Diagnosis not present

## 2016-05-17 DIAGNOSIS — N201 Calculus of ureter: Secondary | ICD-10-CM | POA: Insufficient documentation

## 2016-05-17 LAB — POCT I-STAT CREATININE: Creatinine, Ser: 0.9 mg/dL (ref 0.44–1.00)

## 2016-05-17 MED ORDER — SODIUM CHLORIDE 0.9 % IV SOLN
INTRAVENOUS | Status: AC
Start: 2016-05-17 — End: 2016-05-17
  Filled 2016-05-17: qty 250

## 2016-05-17 MED ORDER — IOPAMIDOL (ISOVUE-300) INJECTION 61%
INTRAVENOUS | Status: AC
  Administered 2016-05-17: 125 mL via INTRAVENOUS
  Filled 2016-05-17: qty 30

## 2016-06-09 DIAGNOSIS — R319 Hematuria, unspecified: Secondary | ICD-10-CM | POA: Diagnosis not present

## 2016-06-12 ENCOUNTER — Ambulatory Visit (INDEPENDENT_AMBULATORY_CARE_PROVIDER_SITE_OTHER): Payer: Medicare Other | Admitting: *Deleted

## 2016-06-12 DIAGNOSIS — I4891 Unspecified atrial fibrillation: Secondary | ICD-10-CM | POA: Diagnosis not present

## 2016-06-12 DIAGNOSIS — Z7901 Long term (current) use of anticoagulants: Secondary | ICD-10-CM | POA: Diagnosis not present

## 2016-06-12 DIAGNOSIS — Z5181 Encounter for therapeutic drug level monitoring: Secondary | ICD-10-CM | POA: Diagnosis not present

## 2016-06-12 LAB — POCT INR: INR: 2

## 2016-06-13 ENCOUNTER — Other Ambulatory Visit (HOSPITAL_COMMUNITY): Payer: Self-pay | Admitting: Internal Medicine

## 2016-06-13 DIAGNOSIS — R319 Hematuria, unspecified: Secondary | ICD-10-CM

## 2016-06-19 ENCOUNTER — Ambulatory Visit (HOSPITAL_COMMUNITY)
Admission: RE | Admit: 2016-06-19 | Discharge: 2016-06-19 | Disposition: A | Discharge status: Home / Self Care | Payer: Medicare Other | Source: Ambulatory Visit | Attending: Internal Medicine | Admitting: Internal Medicine

## 2016-06-19 DIAGNOSIS — N201 Calculus of ureter: Secondary | ICD-10-CM | POA: Insufficient documentation

## 2016-06-19 DIAGNOSIS — R319 Hematuria, unspecified: Secondary | ICD-10-CM | POA: Diagnosis not present

## 2016-06-19 DIAGNOSIS — I700 Atherosclerosis of aorta: Secondary | ICD-10-CM | POA: Diagnosis not present

## 2016-06-19 DIAGNOSIS — R310 Gross hematuria: Secondary | ICD-10-CM | POA: Diagnosis not present

## 2016-07-03 DIAGNOSIS — N201 Calculus of ureter: Secondary | ICD-10-CM | POA: Diagnosis not present

## 2016-07-24 ENCOUNTER — Ambulatory Visit (INDEPENDENT_AMBULATORY_CARE_PROVIDER_SITE_OTHER): Payer: Medicare Other | Admitting: *Deleted

## 2016-07-24 DIAGNOSIS — Z7901 Long term (current) use of anticoagulants: Secondary | ICD-10-CM | POA: Diagnosis not present

## 2016-07-24 DIAGNOSIS — Z5181 Encounter for therapeutic drug level monitoring: Secondary | ICD-10-CM

## 2016-07-24 DIAGNOSIS — I4891 Unspecified atrial fibrillation: Secondary | ICD-10-CM

## 2016-07-24 LAB — POCT INR: INR: 2.7

## 2016-07-31 ENCOUNTER — Other Ambulatory Visit: Payer: Self-pay | Admitting: Cardiology

## 2016-08-07 DIAGNOSIS — N201 Calculus of ureter: Secondary | ICD-10-CM | POA: Diagnosis not present

## 2016-08-08 ENCOUNTER — Other Ambulatory Visit: Payer: Self-pay | Admitting: Urology

## 2016-08-09 ENCOUNTER — Other Ambulatory Visit: Payer: Self-pay | Admitting: Urology

## 2016-08-09 ENCOUNTER — Encounter (HOSPITAL_COMMUNITY): Payer: Self-pay

## 2016-08-09 NOTE — Patient Instructions (Addendum)
MEKIA DIPINTO  08/09/2016   Your procedure is scheduled on: 08/21/2016    Report to [facility] Main  Entrance Take [place]  elevators to 3rd floor to  [place] at    [phone] AM.    Call this number if you have problems the morning of surgery [phone]    Remember: ONLY 1 PERSON MAY GO WITH YOU TO SHORT STAY TO GET  READY MORNING OF [place].  Do not eat food or drink liquids :After Midnight.     Take these medicines the morning of surgery with A SIP OF WATER: claritin, metoprolol ( Toprol) DO NOT TAKE ANY DIABETIC MEDICATIONS DAY OF YOUR SURGERY                               You may not have any metal on your body including hair pins and              piercings  Do not wear jewelry, make-up, lotions, powders or perfumes, deodorant             Do not wear nail polish.  Do not shave  48 hours prior to surgery.                 Do not bring valuables to the hospital. [place].  Contacts, dentures or bridgework may not be worn into surgery.      Patients discharged the day of surgery will not be allowed to drive home.  Name and phone number of your driver:                Please read over the following fact sheets you were given: _____________________________________________________________________             [facility] - Preparing for Surgery Before surgery, you can play an important role.  Because skin is not sterile, your skin needs to be as free of germs as possible.  You can reduce the number of germs on your skin by washing with CHG (chlorahexidine gluconate) soap before surgery.  CHG is an antiseptic cleaner which kills germs and bonds with the skin to continue killing germs even after washing. Please DO NOT use if you have an allergy to CHG or antibacterial soaps.  If your skin becomes reddened/irritated stop using the CHG and inform your nurse when you arrive at Short Stay. Do not shave  (including legs and underarms) for at least 48 hours prior to the first CHG shower.  You may shave your face/neck. Please follow these instructions carefully:  1.  Shower with CHG Soap the night before surgery and the  morning of Surgery.  2.  If you choose to wash your hair, wash your hair first as usual with your  normal  shampoo.  3.  After you shampoo, rinse your hair and body thoroughly to remove the  shampoo.                           4.  Use CHG as you would any other liquid soap.  You can apply chg directly  to the skin and wash  Gently with a scrungie or clean washcloth.  5.  Apply the CHG Soap to your body ONLY FROM THE NECK DOWN.   Do not use on face/ open                           Wound or open sores. Avoid contact with eyes, ears mouth and genitals (private parts).                       Wash face,  Genitals (private parts) with your normal soap.             6.  Wash thoroughly, paying special attention to the area where your surgery  will be performed.  7.  Thoroughly rinse your body with warm water from the neck down.  8.  DO NOT shower/wash with your normal soap after using and rinsing off  the CHG Soap.                9.  Pat yourself dry with a clean towel.            10.  Wear clean pajamas.            11.  Place clean sheets on your bed the night of your first shower and do not  sleep with pets. Day of Surgery : Do not apply any lotions/deodorants the morning of surgery.  Please wear clean clothes to the hospital/surgery center.  FAILURE TO FOLLOW THESE INSTRUCTIONS MAY RESULT IN THE CANCELLATION OF YOUR SURGERY PATIENT SIGNATURE_________________________________  NURSE SIGNATURE__________________________________  ________________________________________________________________________

## 2016-08-11 ENCOUNTER — Ambulatory Visit (INDEPENDENT_AMBULATORY_CARE_PROVIDER_SITE_OTHER): Payer: Medicare Other | Admitting: Cardiology

## 2016-08-11 ENCOUNTER — Encounter: Payer: Self-pay | Admitting: Cardiology

## 2016-08-11 VITALS — BP 93/60 | HR 78 | Ht 62.0 in | Wt 176.0 lb

## 2016-08-11 DIAGNOSIS — I350 Nonrheumatic aortic (valve) stenosis: Secondary | ICD-10-CM

## 2016-08-11 DIAGNOSIS — I4891 Unspecified atrial fibrillation: Secondary | ICD-10-CM

## 2016-08-11 NOTE — Progress Notes (Signed)
Clinical Summary Anne Wolf is a 83 y.o.female seen today for follow up of the following medical problems.   1. Afib - no recent palpitations - compliant with meds.   2. Hyperlipidemia - she remains compliant with statin  3. Aortic stenosis -  Mild to moderate by echo 10/2013. Mean grad 11, ava 1.24 - denies any chest pain, SOB/DOE, or syncope since our last visit  4. Kidney stone - planned for surgery per her report.     Past Medical History:  Diagnosis Date  . Aortic stenosis    mild; mild MR; slightly increased pulmonary artery pressure with mild RVH; normal LV-2010  . Atrial fibrillation (HCC)    chronic anticoagulation; adequate HR control on minimal AV nodal blocking medication   . Diabetes mellitus without complication ([place])   . Dysrhythmia    A FIB  . Fasting hyperglycemia    + microalbuminuria; A1c of 6.5% in 11/2010  . Hematochezia 01/2009   01/2009-presumed ischemic colitis; history of diverticulosis  . Hx of adenomatous colonic polyps 2005   adenomatous polyp; negative colonoscopy in 2009  . Hyperlipidemia      No Known Allergies   Current Outpatient Prescriptions  Medication Sig Dispense Refill  . cholecalciferol (VITAMIN D) 1000 units tablet Take 1,000 Units by mouth daily.    Marland Kitchen Co-Enzyme Q-10 100 MG CAPS Take 1 capsule by mouth at bedtime.     . diclofenac sodium (VOLTAREN) 1 % GEL Apply 2 g topically daily as needed (pain).    Marland Kitchen lisinopril (PRINIVIL,ZESTRIL) 10 MG tablet Take 10 mg by mouth every evening.     . loratadine (CLARITIN) 10 MG tablet Take 10 mg by mouth daily.    . metFORMIN (GLUCOPHAGE) 500 MG tablet Take 500 mg by mouth 2 (two) times daily with a meal.     . metoprolol succinate (TOPROL-XL) 25 MG 24 hr tablet Take 12.5 mg by mouth 2 (two) times daily.     . Multiple Vitamins-Minerals (CENTRUM SILVER) tablet Take 1 tablet by mouth at bedtime.     . simvastatin (ZOCOR) 40 MG tablet Take 40 mg by mouth at bedtime.      Marland Kitchen  warfarin (COUMADIN) 2 MG tablet TAKE 1 TABLET DAILY EXCEPT  1/2 TABLET ON TUESDAYS 64 tablet 1   No current facility-administered medications for this visit.      Past Surgical History:  Procedure Laterality Date  . CATARACT EXTRACTION W/PHACO Right 03/23/2014   Procedure: CATARACT EXTRACTION PHACO AND INTRAOCULAR LENS PLACEMENT (IOC);  Surgeon: Williams Che, MD;  Location: [facility];  Service: Ophthalmology;  Laterality: Right;  CDE:2.83  . CATARACT EXTRACTION W/PHACO Left 07/27/2014   Procedure: CATARACT EXTRACTION PHACO AND INTRAOCULAR LENS PLACEMENT LEFT EYE CDE=6.97;  Surgeon: Williams Che, MD;  Location: [facility];  Service: Ophthalmology;  Laterality: Left;  . COLONOSCOPY  2009   negative; prior study with adenomatous polyp     No Known Allergies    Family History  Problem Relation Age of Onset  . Cancer Father   . Arrhythmia Sister        Atrial fibrillation     Social History Anne Wolf reports that she has never smoked. She has never used smokeless tobacco. Anne Wolf reports that she does not drink alcohol.   Review of Systems CONSTITUTIONAL: No weight loss, fever, chills, weakness or fatigue.  HEENT: Eyes: No visual loss, blurred vision, double vision or yellow sclerae.No hearing loss, sneezing, congestion, runny nose or  sore throat.  SKIN: No rash or itching.  CARDIOVASCULAR: per hpi RESPIRATORY: No shortness of breath, cough or sputum.  GASTROINTESTINAL: No anorexia, nausea, vomiting or diarrhea. No abdominal pain or blood.  GENITOURINARY: No burning on urination, no polyuria NEUROLOGICAL: No headache, dizziness, syncope, paralysis, ataxia, numbness or tingling in the extremities. No change in bowel or bladder control.  MUSCULOSKELETAL: No muscle, back pain, joint pain or stiffness.  LYMPHATICS: No enlarged nodes. No history of splenectomy.  PSYCHIATRIC: No history of depression or anxiety.  ENDOCRINOLOGIC: No reports of sweating, cold or heat intolerance. No  polyuria or polydipsia.  Marland Kitchen   Physical Examination Vitals:   08/11/16 1332  BP: 93/60  Pulse: 78   Vitals:   08/11/16 1332  Weight: 176 lb (79.8 kg)  Height: _0  (1.575 m)    Gen: resting comfortably, no acute distress HEENT: no scleral icterus, pupils equal round and reactive, no palptable cervical adenopathy,  CV: RRR, 2/6 systolic murmur rusb, no jvd Resp: Clear to auscultation bilaterally GI: abdomen is soft, non-tender, non-distended, normal bowel sounds, no hepatosplenomegaly MSK: extremities are warm, no edema.  Skin: warm, no rash Neuro:  no focal deficits Psych: appropriate affect   Diagnostic Studies 10/2013 Echo Study Conclusions  - Left ventricle: The cavity size was normal. Wall thickness was increased in a pattern of moderate LVH. Systolic function was normal. The estimated ejection fraction was in the range of 55% to 60%. Wall motion was normal; there were no regional wall motion abnormalities. Doppler parameters are consistent with elevated ventricular end-diastolic filling pressure. - Aortic valve: Mildly calcified annulus. Trileaflet; mildly to moderately calcified leaflets. Cusp separation was mildly reduced. There was mild to moderate stenosis. There was mild regurgitation. Mean gradient (S): 11 mm Hg. Peak gradient (S): 23 mm Hg. VTI ratio of LVOT to aortic valve: 0.4. Valve area (VTI): 1.24 cm^2. Valve area (Vmax): 1.32 cm^2. Planimetry valve area 1.8 square cm. - Mitral valve: Calcified annulus. There was mild regurgitation. Valve area by pressure half-time: 2.2 cm^2. - Left atrium: The atrium was severely dilated. - Right atrium: The atrium was severely dilated. Central venous pressure (est): 3 mm Hg. - Atrial septum: No defect or patent foramen ovale was identified. - Tricuspid valve: There was mild regurgitation. - Pulmonary arteries: PA peak pressure: 35 mm Hg (S). - Pericardium, extracardiac: There was no  pericardial effusion.  Impressions:  - Moderate LVH with LVEF 55-60%, indeterminate diastolic function with evidence of increased LVEDP. Severe biatrial enlargement. Moderate MAC with mild mitral regurgitation. Mild to moderate, calcific aortic stenosis as outlined above with mild aortic regurgitation. Mild tricuspid regurgitation with PASP 35 mmHg.     Assessment and Plan  1. Afib - no symptoms, she will continue current meds - continue coumadin  2. Hyperlipidemia - continue statin., request labs from pcp  3. Aortic stenosis - repeat echo   F/u 1 year      Arnoldo Lenis, M.D.

## 2016-08-11 NOTE — Patient Instructions (Signed)
Your physician wants you to follow-up in: 1 year Dr Bryna Colander will receive a reminder letter in the mail two months in advance. If you don't receive a letter, please call our office to schedule the follow-up appointment.    Your physician has requested that you have an echocardiogram. Echocardiography is a painless test that uses sound waves to create images of your heart. It provides your doctor with information about the size and shape of your heart and how well your heart's chambers and valves are working. This procedure takes approximately one hour. There are no restrictions for this procedure.      Your physician recommends that you continue on your current medications as directed. Please refer to the Current Medication list given to you today.    If you need a refill on your cardiac medications before your next appointment, please call your pharmacy.     No lab work today       Thank you for [place] !

## 2016-08-14 ENCOUNTER — Encounter (HOSPITAL_COMMUNITY): Payer: Self-pay

## 2016-08-14 ENCOUNTER — Encounter (HOSPITAL_COMMUNITY)
Admission: RE | Admit: 2016-08-14 | Discharge: 2016-08-14 | Disposition: A | Discharge status: Home / Self Care | Payer: Medicare Other | Source: Ambulatory Visit | Attending: Urology | Admitting: Urology

## 2016-08-14 DIAGNOSIS — N201 Calculus of ureter: Secondary | ICD-10-CM | POA: Diagnosis not present

## 2016-08-14 DIAGNOSIS — Z01818 Encounter for other preprocedural examination: Secondary | ICD-10-CM | POA: Insufficient documentation

## 2016-08-14 HISTORY — DX: Unspecified osteoarthritis, unspecified site: M19.90

## 2016-08-14 HISTORY — DX: Malignant (primary) neoplasm, unspecified: C80.1

## 2016-08-14 HISTORY — DX: Personal history of urinary calculi: Z87.442

## 2016-08-14 LAB — CBC
HEMATOCRIT: 41.8 % (ref 36.0–46.0)
Hemoglobin: 13.8 g/dL (ref 12.0–15.0)
MCH: 29.4 pg (ref 26.0–34.0)
MCHC: 33 g/dL (ref 30.0–36.0)
MCV: 88.9 fL (ref 78.0–100.0)
PLATELETS: 217 10*3/uL (ref 150–400)
RBC: 4.7 MIL/uL (ref 3.87–5.11)
RDW: 13.9 % (ref 11.5–15.5)
WBC: 7.9 10*3/uL (ref 4.0–10.5)

## 2016-08-14 LAB — BASIC METABOLIC PANEL
Anion gap: 7 (ref 5–15)
BUN: 18 mg/dL (ref 6–20)
CHLORIDE: 105 mmol/L (ref 101–111)
CO2: 28 mmol/L (ref 22–32)
CREATININE: 0.89 mg/dL (ref 0.44–1.00)
Calcium: 9.8 mg/dL (ref 8.9–10.3)
GFR calc Af Amer: >60 mL/min (ref >60)
GFR calc non Af Amer: 60 mL/min — ABNORMAL LOW (ref >60)
GLUCOSE: 123 mg/dL — AB (ref 65–99)
POTASSIUM: 4.9 mmol/L (ref 3.5–5.1)
Sodium: 140 mmol/L (ref 135–145)

## 2016-08-14 LAB — GLUCOSE, CAPILLARY: GLUCOSE-CAPILLARY: 141 mg/dL — AB (ref 65–99)

## 2016-08-14 NOTE — Progress Notes (Signed)
EKG 08/11/16 in epic

## 2016-08-15 ENCOUNTER — Telehealth: Payer: Self-pay | Admitting: *Deleted

## 2016-08-15 LAB — HEMOGLOBIN A1C
Hgb A1c MFr Bld: 5.8 % — ABNORMAL HIGH (ref 4.8–5.6)
Mean Plasma Glucose: 120 mg/dL

## 2016-08-15 NOTE — Telephone Encounter (Signed)
Pending surgical procedure 08/21/16.  OK to hold coumadin 5 days prior to procedure.  Pt will take last dose of coumadin 09/14/16 and restart coumadin on 08/22/16.  She has INR appt on 7/16 for f/u.  She verbalized understanding.

## 2016-08-15 NOTE — Telephone Encounter (Signed)
-----   Message from Arnoldo Lenis, MD sent at 08/13/2016  6:31 PM EDT ----- Patient needs to hold her coumadin 5 days prior to upcoming surgery, please arrange. Does not need bridge.Zandra Abts MD

## 2016-08-20 NOTE — Anesthesia Preprocedure Evaluation (Addendum)
Anesthesia Evaluation  Patient identified by MRN, date of birth, ID band Patient awake    Reviewed: NPO status , Patient's Chart, lab work & pertinent test results  History of Anesthesia Complications (+) Emergence Delirium  Airway Mallampati: II  TM Distance: >3 FB     Dental   Pulmonary    breath sounds clear to auscultation       Cardiovascular + dysrhythmias AS  Rhythm:Regular Rate:Normal     Neuro/Psych    GI/Hepatic Neg liver ROS,   Endo/Other  diabetes  Renal/GU Renal disease     Musculoskeletal   Abdominal   Peds  Hematology   Anesthesia Other Findings   Reproductive/Obstetrics                          Anesthesia Physical Anesthesia Plan  ASA: III  Anesthesia Plan: General   Post-op Pain Management:    Induction: Intravenous  PONV Risk Score and Plan: 3 and Ondansetron, Dexamethasone, Propofol and Midazolam  Airway Management Planned: LMA  Additional Equipment:   Intra-op Plan:   Post-operative Plan: Extubation in OR  Informed Consent: I have reviewed the patients History and Physical, chart, labs and discussed the procedure including the risks, benefits and alternatives for the proposed anesthesia with the patient or authorized representative who has indicated his/her understanding and acceptance.   Dental advisory given  Plan Discussed with: Anesthesiologist, CRNA and Surgeon  Anesthesia Plan Comments:       Anesthesia Quick Evaluation

## 2016-08-21 ENCOUNTER — Encounter (HOSPITAL_COMMUNITY): Payer: Self-pay | Admitting: *Deleted

## 2016-08-21 ENCOUNTER — Ambulatory Visit (HOSPITAL_COMMUNITY)
Admission: RE | Admit: 2016-08-21 | Discharge: 2016-08-21 | Disposition: A | Discharge status: Home / Self Care | Payer: Medicare Other | Source: Ambulatory Visit | Attending: Urology | Admitting: Urology

## 2016-08-21 ENCOUNTER — Ambulatory Visit (HOSPITAL_COMMUNITY): Payer: Medicare Other | Admitting: Anesthesiology

## 2016-08-21 ENCOUNTER — Encounter (HOSPITAL_COMMUNITY)
Admission: RE | Disposition: A | Discharge status: Home / Self Care | Payer: Self-pay | Source: Ambulatory Visit | Attending: Urology

## 2016-08-21 ENCOUNTER — Ambulatory Visit (HOSPITAL_COMMUNITY): Payer: Medicare Other

## 2016-08-21 DIAGNOSIS — M1990 Unspecified osteoarthritis, unspecified site: Secondary | ICD-10-CM | POA: Insufficient documentation

## 2016-08-21 DIAGNOSIS — K921 Melena: Secondary | ICD-10-CM | POA: Diagnosis not present

## 2016-08-21 DIAGNOSIS — Z7984 Long term (current) use of oral hypoglycemic drugs: Secondary | ICD-10-CM | POA: Insufficient documentation

## 2016-08-21 DIAGNOSIS — Z79899 Other long term (current) drug therapy: Secondary | ICD-10-CM | POA: Insufficient documentation

## 2016-08-21 DIAGNOSIS — I4891 Unspecified atrial fibrillation: Secondary | ICD-10-CM | POA: Diagnosis not present

## 2016-08-21 DIAGNOSIS — N132 Hydronephrosis with renal and ureteral calculous obstruction: Secondary | ICD-10-CM | POA: Diagnosis not present

## 2016-08-21 DIAGNOSIS — E119 Type 2 diabetes mellitus without complications: Secondary | ICD-10-CM | POA: Diagnosis not present

## 2016-08-21 DIAGNOSIS — N201 Calculus of ureter: Secondary | ICD-10-CM | POA: Diagnosis not present

## 2016-08-21 DIAGNOSIS — Z7901 Long term (current) use of anticoagulants: Secondary | ICD-10-CM | POA: Diagnosis not present

## 2016-08-21 DIAGNOSIS — E785 Hyperlipidemia, unspecified: Secondary | ICD-10-CM | POA: Diagnosis not present

## 2016-08-21 HISTORY — PX: CYSTOSCOPY/URETEROSCOPY/HOLMIUM LASER/STENT PLACEMENT: SHX6546

## 2016-08-21 LAB — PROTIME-INR
INR: 1.22
Prothrombin Time: 15.5 seconds — ABNORMAL HIGH (ref 11.4–15.2)

## 2016-08-21 LAB — GLUCOSE, CAPILLARY: Glucose-Capillary: 124 mg/dL — ABNORMAL HIGH (ref 65–99)

## 2016-08-21 SURGERY — CYSTOSCOPY/URETEROSCOPY/HOLMIUM LASER/STENT PLACEMENT
Anesthesia: General | Laterality: Right

## 2016-08-21 MED ORDER — PHENYLEPHRINE 40 MCG/ML (10ML) SYRINGE FOR IV PUSH (FOR BLOOD PRESSURE SUPPORT)
PREFILLED_SYRINGE | INTRAVENOUS | Status: DC | PRN
  Administered 2016-08-21: 80 ug via INTRAVENOUS
  Administered 2016-08-21: 160 ug via INTRAVENOUS
  Administered 2016-08-21 (×2): 80 ug via INTRAVENOUS

## 2016-08-21 MED ORDER — DEXAMETHASONE SODIUM PHOSPHATE 10 MG/ML IJ SOLN
INTRAMUSCULAR | Status: AC
  Filled 2016-08-21: qty 1

## 2016-08-21 MED ORDER — LIDOCAINE 2% (20 MG/ML) 5 ML SYRINGE
INTRAMUSCULAR | Status: DC | PRN
  Administered 2016-08-21: 10 mg via INTRAVENOUS

## 2016-08-21 MED ORDER — IOHEXOL 300 MG/ML  SOLN
INTRAMUSCULAR | Status: DC | PRN
  Administered 2016-08-21: 20 mL via URETHRAL

## 2016-08-21 MED ORDER — LACTATED RINGERS IV SOLN
INTRAVENOUS | Status: DC | PRN
  Administered 2016-08-21: 07:00:00 via INTRAVENOUS

## 2016-08-21 MED ORDER — ONDANSETRON HCL 4 MG/2ML IJ SOLN
INTRAMUSCULAR | Status: AC
  Filled 2016-08-21: qty 2

## 2016-08-21 MED ORDER — FENTANYL CITRATE (PF) 100 MCG/2ML IJ SOLN
INTRAMUSCULAR | Status: DC | PRN
  Administered 2016-08-21: 25 ug via INTRAVENOUS

## 2016-08-21 MED ORDER — SODIUM CHLORIDE 0.9 % IR SOLN
Status: DC | PRN
  Administered 2016-08-21: 4000 mL via INTRAVESICAL

## 2016-08-21 MED ORDER — FENTANYL CITRATE (PF) 100 MCG/2ML IJ SOLN
INTRAMUSCULAR | Status: AC
  Filled 2016-08-21: qty 2

## 2016-08-21 MED ORDER — PROPOFOL 10 MG/ML IV BOLUS
INTRAVENOUS | Status: AC
  Filled 2016-08-21: qty 20

## 2016-08-21 MED ORDER — LIDOCAINE 2% (20 MG/ML) 5 ML SYRINGE
INTRAMUSCULAR | Status: AC
  Filled 2016-08-21: qty 5

## 2016-08-21 MED ORDER — FENTANYL CITRATE (PF) 100 MCG/2ML IJ SOLN: 25.0000 ug | INTRAMUSCULAR | Status: DC | PRN

## 2016-08-21 MED ORDER — ONDANSETRON HCL 4 MG/2ML IJ SOLN
INTRAMUSCULAR | Status: DC | PRN
  Administered 2016-08-21: 4 mg via INTRAVENOUS

## 2016-08-21 MED ORDER — CEPHALEXIN 500 MG PO CAPS: 500.0000 mg | ORAL_CAPSULE | Freq: Three times a day (TID) | ORAL | 0 refills | Status: DC

## 2016-08-21 MED ORDER — PHENYLEPHRINE 40 MCG/ML (10ML) SYRINGE FOR IV PUSH (FOR BLOOD PRESSURE SUPPORT)
PREFILLED_SYRINGE | INTRAVENOUS | Status: AC
  Filled 2016-08-21: qty 10

## 2016-08-21 MED ORDER — CEFAZOLIN SODIUM-DEXTROSE 2-4 GM/100ML-% IV SOLN
2.0000 g | INTRAVENOUS | Status: AC
  Administered 2016-08-21: 2 g via INTRAVENOUS
  Filled 2016-08-21: qty 100

## 2016-08-21 MED ORDER — PHENYLEPHRINE HCL 10 MG/ML IJ SOLN
INTRAMUSCULAR | Status: AC
  Filled 2016-08-21: qty 1

## 2016-08-21 MED ORDER — DEXAMETHASONE SODIUM PHOSPHATE 10 MG/ML IJ SOLN
INTRAMUSCULAR | Status: DC | PRN
  Administered 2016-08-21: 10 mg via INTRAVENOUS

## 2016-08-21 MED ORDER — HYDROCODONE-ACETAMINOPHEN 5-325 MG PO TABS: 1.0000 | ORAL_TABLET | ORAL | 0 refills | Status: DC | PRN

## 2016-08-21 MED ORDER — PROPOFOL 10 MG/ML IV BOLUS
INTRAVENOUS | Status: DC | PRN
  Administered 2016-08-21: 150 mg via INTRAVENOUS

## 2016-08-21 SURGICAL SUPPLY — 14 items
BAG URO CATCHER STRL LF (MISCELLANEOUS) ×2 IMPLANT
BASKET ZERO TIP NITINOL 2.4FR (BASKET) ×2 IMPLANT
CATH INTERMIT  6FR 70CM (CATHETERS) ×2 IMPLANT
CATH URET DUAL LUMEN 6-10FR 50 (CATHETERS) ×2 IMPLANT
CLOTH BEACON ORANGE TIMEOUT ST (SAFETY) ×2 IMPLANT
COVER SURGICAL LIGHT HANDLE (MISCELLANEOUS) IMPLANT
GLOVE BIO SURGEON STRL SZ7.5 (GLOVE) ×2 IMPLANT
GOWN STRL REUS W/TWL LRG LVL3 (GOWN DISPOSABLE) ×4 IMPLANT
GUIDEWIRE ANG ZIPWIRE 038X150 (WIRE) IMPLANT
GUIDEWIRE STR DUAL SENSOR (WIRE) ×2 IMPLANT
MANIFOLD NEPTUNE II (INSTRUMENTS) ×2 IMPLANT
PACK CYSTO (CUSTOM PROCEDURE TRAY) ×2 IMPLANT
STENT URET 6FRX24 CONTOUR (STENTS) ×2 IMPLANT
TUBING CONNECTING 10 (TUBING) ×2 IMPLANT

## 2016-08-21 NOTE — Transfer of Care (Signed)
Immediate Anesthesia Transfer of Care Note  Patient: JULIENNE VOGLER  Procedure(s) Performed: Procedure(s): CYSTOSCOPY/URETEROSCOPY/RETROGRADE PYELOGRAM//STENT PLACEMENT (Right)  Patient Location: PACU  Anesthesia Type:General  Level of Consciousness: sedated  Airway & Oxygen Therapy: Patient Spontanous Breathing and Patient connected to face mask oxygen  Post-op Assessment: Report given to RN and Post -op Vital signs reviewed and stable  Post vital signs: Reviewed and stable  Last Vitals:  Vitals:   08/21/16 0545  BP: (!) 115/48  Pulse: 83  Resp: 16  Temp: 36.7 C    Last Pain:  Vitals:   08/21/16 0606  TempSrc:   PainSc: 0-No pain      Patients Stated Pain Goal: 3 (35/46/56 8127)  Complications: No apparent anesthesia complications

## 2016-08-21 NOTE — Anesthesia Postprocedure Evaluation (Signed)
Anesthesia Post Note  Patient: Anne Wolf  Procedure(s) Performed: Procedure(s) (LRB): CYSTOSCOPY/URETEROSCOPY/RETROGRADE PYELOGRAM//STENT PLACEMENT (Right)     Patient location during evaluation: PACU Anesthesia Type: General Level of consciousness: awake Pain management: pain level controlled Vital Signs Assessment: post-procedure vital signs reviewed and stable Respiratory status: spontaneous breathing Cardiovascular status: stable Anesthetic complications: no    Last Vitals:  Vitals:   08/21/16 0856 08/21/16 0906  BP: 105/68 (!) 103/54  Pulse: 87   Resp:  16  Temp: 36.2 C 36.3 C    Last Pain:  Vitals:   08/21/16 0606  TempSrc:   PainSc: 0-No pain                 Adya Wirz

## 2016-08-21 NOTE — Op Note (Signed)
Date of procedure: 08/21/16  Preoperative diagnosis:  1. Right proximal ureteral stone 2. Right hydronephrosis secondary to #1   Postoperative diagnosis:  1. Same   Procedure: 1. Cystoscopy 2. Right ureteroscopy 3. Stone basketing 4. Right retrograde pyelogram with interpretation 5. Right ureteral stent placement 6 French by 24 cm  Surgeon: Baruch Gouty, MD  Anesthesia: General  Complications: None  Intraoperative findings: On right retrograde pyelogram at the beginning of the procedure, the patient had a filling defect in the proximal ureter consistent with known stone with hydroureteronephrosis proximal to this with a tortuous ureter. Stone was successfully removed. It is very soft and broke into pieces were manipulated. Pan cystoscopy was unremarkable for tumors.  EBL: None  Specimens: Right ureteral stone to office lab  Drains: 6 French by 24 cm right double-J ureteral stent  Disposition: Stable to the postanesthesia care unit  Indication for procedure: The patient is a 83 y.o. female with history of a 4 x 6 mm proximal right ureteral stone has failed medical expulsive therapy presents today for definitive management.  After reviewing the management options for treatment, the patient elected to proceed with the above surgical procedure(s). We have discussed the potential benefits and risks of the procedure, side effects of the proposed treatment, the likelihood of the patient achieving the goals of the procedure, and any potential problems that might occur during the procedure or recuperation. Informed consent has been obtained.  Description of procedure: The patient was met in the preoperative area. All risks, benefits, and indications of the procedure were described in great detail. The patient consented to the procedure. Preoperative antibiotics were given. The patient was taken to the operative theater. General anesthesia was induced per the anesthesia service. The patient  was then placed in the dorsal lithotomy position and prepped and draped in the usual sterile fashion. A preoperative timeout was called.   A 21 French 30 cystoscope was inserted into the patient's bladder per urethra atraumatically. Pan cystoscopy was unremarkable for tumors or any other abnormalities. A right retrograde polygrams obtained which showed right hydroureteronephrosis proximal to the filling defect in the proximal ureter consistent with known stone. Sensor wire was advanced to the level of the right renal pelvis under fluoroscopy. A semirigid ureteroscope was then advanced into the right ureter up to the stone. When the stone was visualized, gentle manipulation caused the stone to break into pieces and low back into the kidney. There are no other stones visualized within the right ureter. A second sensor wire was then placed and a semirigid ureteroscope was removed. Over one of the sensor wires, a single-channel flexor ureteroscope was advanced level of the renal pelvis atraumatically under fluoroscopy. Pan nephroscopy revealed only one significant fragment in size approximately 3-4 mm. This was basketed with a 0 tip stone basket and removed atraumatically and sent to pathology. There is no other significant stones in the kidney or ureter. The cystoscope was then reassembled over the remaining sensor wire and a 6 [place] by 24 cm meter double-J ureteral stent was placed. The sensor wire was removed. A curl was seen in the patient's urinary bladder under direct visualization in the right renal pelvis under fluoroscopy. There is drainage of clear yellow urine at this point. The patient's bladder was drained and she was woken from anesthesia and transferred in stable condition to the postanesthesia care unit.  Plan: The patient will follow-up as scheduled next week for right ureteral stent removal. She will need a renal ultrasound in  1 month to rule out iatrogenic hydronephrosis. We will discuss her  stone analysis when available.  Baruch Gouty, M.D.

## 2016-08-21 NOTE — Anesthesia Procedure Notes (Signed)
Procedure Name: LMA Insertion Date/Time: 08/21/2016 7:34 AM Performed by: Lind Covert Pre-anesthesia Checklist: Patient identified, Emergency Drugs available, Suction available and Patient being monitored Patient Re-evaluated:Patient Re-evaluated prior to inductionOxygen Delivery Method: Circle system utilized Preoxygenation: Pre-oxygenation with 100% oxygen Intubation Type: IV induction LMA: LMA inserted LMA Size: 4.0 Number of attempts: 2 Placement Confirmation: positive ETCO2 and breath sounds checked- equal and bilateral Tube secured with: Tape Dental Injury: Teeth and Oropharynx as per pre-operative assessment

## 2016-08-21 NOTE — H&P (Signed)
CC: I have ureteral stone.  HPI: Anne Wolf is a 83 year-old female established patient who is here for ureteral stone.  The problem is on the right side. This is not her first kidney stone. She is not currently having flank pain, back pain, groin pain, nausea, vomiting, fever or chills. She has not caught a stone in her urine strainer since her symptoms began.   She has never had surgical treatment for calculi in the past.   The patient has a 4 x 6 mm right UPJ stone. This was the other CT scans in March and April 2018. She has also had intermittent gross hematuria. The patient has not had flank pain.. Her only symptom has been gross hematuria. He initially treated with antibiotics for this until the stone was found. Patient has never smoked. She has not had a kidney stone before. Her only symptoms hematuria. She has not had pain.   She is on Coumadin for A. fib. She has stopped this previously for cataract surgery without issue.   Review of her CT scan does show a 6 mm UPJ stone with mild hydronephrosis. It does appear visible on [facility] imaging. KUB at last visit did not show overt visible stone. On repeat imaging today, no overt stone on KUB but patient still with right hydronephrosis.. She does still have microscopic hematuria.       ALLERGIES: No Allergies    MEDICATIONS: Coumadin 2 mg tablet  Lisinopril 10 mg tablet  Metformin Hcl 500 mg tablet  Metoprolol Succinate 25 mg tablet, extended release 24 hr  Simvastatin 40 mg tablet  Coq-10  Multivitamin  Vitamin D-400     GU PSH: No GU PSH    NON-GU PSH: Cataract surgery    GU PMH: Ureteral calculus - 07/03/2016    NON-GU PMH: Arthritis Atrial Fibrillation Diabetes Type 2    FAMILY HISTORY: 2 sons - Son Diabetes - Mother Heart Disease - Runs in Family   SOCIAL HISTORY: Marital Status: Married Has never drank.  Drinks 1 caffeinated drink per day. Patient's occupation is/was Retired.    REVIEW OF SYSTEMS:    GU  Review Female:   Patient reports frequent urination and hard to postpone urination. Patient denies burning /pain with urination, get up at night to urinate, leakage of urine, stream starts and stops, trouble starting your stream, have to strain to urinate, and being pregnant.  Gastrointestinal (Upper):   Patient denies nausea, vomiting, and indigestion/ heartburn.  Gastrointestinal (Lower):   Patient denies diarrhea and constipation.  Constitutional:   Patient denies fever, night sweats, weight loss, and fatigue.  Skin:   Patient denies skin rash/ lesion and itching.  Eyes:   Patient denies blurred vision and double vision.  Ears/ Nose/ Throat:   Patient denies sore throat and sinus problems.  Hematologic/Lymphatic:   Patient denies swollen glands and easy bruising.  Cardiovascular:   Patient denies leg swelling and chest pains.  Respiratory:   Patient denies cough and shortness of breath.  Endocrine:   Patient denies excessive thirst.  Musculoskeletal:   Patient denies back pain and joint pain.  Neurological:   Patient denies headaches and dizziness.  Psychologic:   Patient denies depression and anxiety.   VITAL SIGNS:      08/07/2016 12:00 PM  BP 125/73 mmHg  Pulse 85 /min  Temperature 97.8 F / 37 C   MULTI-SYSTEM PHYSICAL EXAMINATION:    Constitutional: Well-nourished. No physical deformities. Normally developed. Good grooming.  Neck: Neck symmetrical,  not swollen. Normal tracheal position.  Respiratory: No labored breathing, no use of accessory muscles.   Cardiovascular: Normal temperature, normal extremity pulses, no swelling, no varicosities.  Skin: No paleness, no jaundice, no cyanosis. No lesion, no ulcer, no rash.  Gastrointestinal: No mass, no tenderness, no rigidity, non obese abdomen.  Eyes: Normal conjunctivae. Normal eyelids.  Ears, Nose, Mouth, and Throat: Left ear no scars, no lesions, no masses. Right ear no scars, no lesions, no masses. Nose no scars, no lesions, no  masses. Normal hearing. Normal lips.  Musculoskeletal: Normal gait and station of head and neck.     PAST DATA REVIEWED:  Source Of History:  Patient  X-Ray Review: KUB: Reviewed Films. Reviewed Report. Discussed With Patient.  C.T. Abdomen/Pelvis: Reviewed Films. Reviewed Report. Discussed With Patient.     PROCEDURES:         KUB - [id number]  A single view of the abdomen is obtained. Normal bony prominences. No overt stones seen, however she is constipated which could be limiting visualization.               Renal Ultrasound - 29924  Right kidney  Length: 11.0 cm Depth: 5.7 cm Cortical Width: 0.8 cm Width: 5.7 cm  Left Kidney Length:11.4 cm Depth: 4.7 cm Cortical Width: 1.1 cm Width: 4.6 cm   Left Kidney/Ureter:  Appears WNL.  Right Kidney/Ureter:  Hydronephrosis noted. Renal pelvis measures 2.4 cm. ? cortical thinning.   Bladder:  PVR = not visualized.               Urinalysis w/Scope - 81001 Dipstick Dipstick Cont'd Micro  Color: Yellow Bilirubin: Neg WBC/hpf: 10 - 20/hpf  Appearance: Cloudy Ketones: Neg RBC/hpf: 20 - 40/hpf  Specific Gravity: 1.025 Blood: 3+ Bacteria: Mod (26-50/hpf)  pH: 5.5 Protein: Trace Cystals: NS (Not Seen)  Glucose: Neg Urobilinogen: 0.2 Casts: NS (Not Seen)    Nitrites: Neg Trichomonas: Not Present    Leukocyte Esterase: 1+ Mucous: Not Present      Epithelial Cells: 0 - 5/hpf      Yeast: NS (Not Seen)      Sperm: Not Present    Notes:      ASSESSMENT:      ICD-10 Details  1 GU:   Ureteral calculus - N20.1    PLAN:           Orders Labs Urine Culture          Document Letter(s):  Created for Patient: Clinical Summary    The patient and I talked about surgical treatment for their ureteral calculus. Etiologies of urinary calculi including dehydration, poor fluid intake, intake of well water, congenital renal disease, previous bowel surgery, idiopathic, and others were discussed with the patient.   Metabolic evaluation of  ureteral stone disease was discussed with the patient. Alternative treatment options including increased water intake, increased lemonade intake, and dietary moderation with calcium and oxalate containing foods were discussed with the patient in detail.   The risks, benefits, and some of the possible complications of surgical treatments including cystoscopy, ureteroscopy, renoscopy, laser lithotripsy, extracorporeal shock wave lithotripsy, stent insertion and others were discussed with the patient. All questions were answered.  The patient gave fully informed consent to proceed with a ureteroscopy with or without laser lithotripsy with stone extraction for the treatment of their ureteral calculi.   The patient was given instructions to call for abdominal pain, pelvic pain, perirectal pain, nausea, vomiting, diarrhea, fever over 100 degrees F, chills, hematuria,  or dysuria.        Notes:   1. Right ureteral stone  Based on imaging the stone is still present since there is hydronephrosis on the right. The stone is not visible on KUB, so she is not eligible for lithotripsy. She will undergo cystoscopy, right ureteroscopy, laser lithotripsy, right ureteral stent placement.   2. Gross hematuria  -will ensure resolution of hematuria once stone is treated

## 2016-08-21 NOTE — Discharge Instructions (Signed)
Kidney Stones Kidney stones (urolithiasis) are rock-like masses that form inside of the kidneys. Kidneys are organs that make pee (urine). A kidney stone can cause very bad pain and can block the flow of pee. The stone usually leaves your body (passes) through your pee. You may need to have a doctor take out the stone. Follow these instructions at home: Eating and drinking Drink enough fluid to keep your pee clear or pale yellow. This will help you pass the stone. If told by your doctor, change the foods you eat (your diet). This may include: Limiting how much salt (sodium) you eat. Eating more fruits and vegetables. Limiting how much meat, poultry, fish, and eggs you eat. Follow instructions from your doctor about eating or drinking restrictions. General instructions Collect pee samples as told by your doctor. You may need to collect a pee sample: 24 hours after a stone comes out. 8-12 weeks after a stone comes out, and every 6-12 months after that. Strain your pee every time you pee (urinate), for as long as told. Use the strainer that your doctor recommends. Do not throw out the stone. Keep it so that it can be tested by your doctor. Take over-the-counter and prescription medicines only as told by your doctor. Keep all follow-up visits as told by your doctor. This is important. You may need follow-up tests. Preventing kidney stones To prevent another kidney stone: Drink enough fluid to keep your pee clear or pale yellow. This is the best way to prevent kidney stones. Eat healthy foods. Avoid certain foods as told by your doctor. You may be told to eat less protein. Stay at a healthy weight.  Contact a doctor if: You have pain that gets worse or does not get better with medicine. Get help right away if: You have a fever or chills. You get very bad pain. You get new pain in your belly (abdomen). You pass out (faint). You cannot pee. This information is not intended to replace advice  given to you by your health care provider. Make sure you discuss any questions you have with your health care provider. Document Released: 07/26/2007 Document Revised: 10/26/2015 Document Reviewed: 10/26/2015 Elsevier Interactive Patient Education  2017 [place]. Cystoscopy, Care After Refer to this sheet in the next few weeks. These instructions provide you with information about caring for yourself after your procedure. Your health care provider may also give you more specific instructions. Your treatment has been planned according to current medical practices, but problems sometimes occur. Call your health care provider if you have any problems or questions after your procedure. What can I expect after the procedure? After the procedure, it is common to have:  Mild pain when you urinate. Pain should stop within a few minutes after you urinate. This may last for up to 1 week.  A small amount of blood in your urine for several days.  Feeling like you need to urinate but producing only a small amount of urine.  Follow these instructions at home:  Medicines  Take over-the-counter and prescription medicines only as told by your health care provider.  If you were prescribed an antibiotic medicine, take it as told by your health care provider. Do not stop taking the antibiotic even if you start to feel better. General instructions   Return to your normal activities as told by your health care provider. Ask your health care provider what activities are safe for you.  Do not drive for 24 hours if you  received a sedative.  Watch for any blood in your urine. If the amount of blood in your urine increases, call your health care provider.  Follow instructions from your health care provider about eating or drinking restrictions.  If a tissue sample was removed for testing (biopsy) during your procedure, it is your responsibility to get your test results. Ask your health care provider or the  department performing the test when your results will be ready.  Drink enough fluid to keep your urine clear or pale yellow.  Keep all follow-up visits as told by your health care provider. This is important. Contact a health care provider if:  You have pain that gets worse or does not get better with medicine, especially pain when you urinate.  You have difficulty urinating. Get help right away if:  You have more blood in your urine.  You have blood clots in your urine.  You have abdominal pain.  You have a fever or chills.  You are unable to urinate. This information is not intended to replace advice given to you by your health care provider. Make sure you discuss any questions you have with your health care provider. Document Released: 08/26/2004 Document Revised: 07/15/2015 Document Reviewed: 12/24/2014 Elsevier Interactive Patient Education  2017 [place] Anesthesia, Adult, Care After These instructions provide you with information about caring for yourself after your procedure. Your health care provider may also give you more specific instructions. Your treatment has been planned according to current medical practices, but problems sometimes occur. Call your health care provider if you have any problems or questions after your procedure. What can I expect after the procedure? After the procedure, it is common to have:  Vomiting.  A sore throat.  Mental slowness.  It is common to feel:  Nauseous.  Cold or shivery.  Sleepy.  Tired.  Sore or achy, even in parts of your body where you did not have surgery.  Follow these instructions at home: For at least 24 hours after the procedure:  Do not: ? Participate in activities where you could fall or become injured. ? Drive. ? Use heavy machinery. ? Drink alcohol. ? Take sleeping pills or medicines that cause drowsiness. ? Make important decisions or sign legal documents. ? Take care of children on  your own.  Rest. Eating and drinking  If you vomit, drink water, juice, or soup when you can drink without vomiting.  Drink enough fluid to keep your urine clear or pale yellow.  Make sure you have little or no nausea before eating solid foods.  Follow the diet recommended by your health care provider. General instructions  Have a responsible adult stay with you until you are awake and alert.  Return to your normal activities as told by your health care provider. Ask your health care provider what activities are safe for you.  Take over-the-counter and prescription medicines only as told by your health care provider.  If you smoke, do not smoke without supervision.  Keep all follow-up visits as told by your health care provider. This is important. Contact a health care provider if:  You continue to have nausea or vomiting at home, and medicines are not helpful.  You cannot drink fluids or start eating again.  You cannot urinate after 8-12 hours.  You develop a skin rash.  You have fever.  You have increasing redness at the site of your procedure. Get help right away if:  You have difficulty breathing.  You  have chest pain.  You have unexpected bleeding.  You feel that you are having a life-threatening or urgent problem. This information is not intended to replace advice given to you by your health care provider. Make sure you discuss any questions you have with your health care provider. Document Released: 05/15/2000 Document Revised: 07/12/2015 Document Reviewed: 01/21/2015 Elsevier Interactive Patient Education  [employer].

## 2016-08-25 DIAGNOSIS — N201 Calculus of ureter: Secondary | ICD-10-CM | POA: Diagnosis not present

## 2016-08-28 DIAGNOSIS — N201 Calculus of ureter: Secondary | ICD-10-CM | POA: Diagnosis not present

## 2016-09-04 ENCOUNTER — Ambulatory Visit (INDEPENDENT_AMBULATORY_CARE_PROVIDER_SITE_OTHER): Payer: Medicare Other | Admitting: *Deleted

## 2016-09-04 ENCOUNTER — Ambulatory Visit (HOSPITAL_COMMUNITY)
Admission: RE | Admit: 2016-09-04 | Discharge: 2016-09-04 | Disposition: A | Discharge status: Home / Self Care | Payer: Medicare Other | Source: Ambulatory Visit | Attending: Cardiology | Admitting: Cardiology

## 2016-09-04 DIAGNOSIS — Z7901 Long term (current) use of anticoagulants: Secondary | ICD-10-CM | POA: Diagnosis not present

## 2016-09-04 DIAGNOSIS — I482 Chronic atrial fibrillation: Secondary | ICD-10-CM | POA: Diagnosis not present

## 2016-09-04 DIAGNOSIS — I350 Nonrheumatic aortic (valve) stenosis: Secondary | ICD-10-CM | POA: Diagnosis not present

## 2016-09-04 DIAGNOSIS — I080 Rheumatic disorders of both mitral and aortic valves: Secondary | ICD-10-CM | POA: Diagnosis not present

## 2016-09-04 DIAGNOSIS — Z5181 Encounter for therapeutic drug level monitoring: Secondary | ICD-10-CM | POA: Diagnosis not present

## 2016-09-04 DIAGNOSIS — Z79899 Other long term (current) drug therapy: Secondary | ICD-10-CM | POA: Diagnosis not present

## 2016-09-04 DIAGNOSIS — I4891 Unspecified atrial fibrillation: Secondary | ICD-10-CM

## 2016-09-04 DIAGNOSIS — M810 Age-related osteoporosis without current pathological fracture: Secondary | ICD-10-CM | POA: Diagnosis not present

## 2016-09-04 DIAGNOSIS — E119 Type 2 diabetes mellitus without complications: Secondary | ICD-10-CM | POA: Diagnosis not present

## 2016-09-04 LAB — POCT INR: INR: 2.4

## 2016-09-04 NOTE — Progress Notes (Signed)
*  PRELIMINARY RESULTS* Echocardiogram 2D Echocardiogram has been performed.  Leavy Cella 09/04/2016, 9:20 AM

## 2016-09-08 ENCOUNTER — Telehealth: Payer: Self-pay

## 2016-09-08 DIAGNOSIS — N3000 Acute cystitis without hematuria: Secondary | ICD-10-CM | POA: Diagnosis not present

## 2016-09-08 DIAGNOSIS — Z0001 Encounter for general adult medical examination with abnormal findings: Secondary | ICD-10-CM | POA: Diagnosis not present

## 2016-09-08 DIAGNOSIS — I482 Chronic atrial fibrillation: Secondary | ICD-10-CM | POA: Diagnosis not present

## 2016-09-08 DIAGNOSIS — E1129 Type 2 diabetes mellitus with other diabetic kidney complication: Secondary | ICD-10-CM | POA: Diagnosis not present

## 2016-09-08 NOTE — Telephone Encounter (Signed)
Called pt, no answer- unable to leave message. Will call pt on Monday.

## 2016-09-08 NOTE — Telephone Encounter (Signed)
-----   Message from Arnoldo Lenis, MD sent at 09/08/2016  4:01 PM EDT ----- Echo shows heart pumping function is strong. Her heart valve (aortic valve) remains mild to moderately stiff, it has not progressed and currently is not a significant issue. We will continue to monitor  Anne Abts MD

## 2016-09-11 ENCOUNTER — Telehealth: Payer: Self-pay | Admitting: Cardiology

## 2016-09-11 NOTE — Telephone Encounter (Signed)
Pt is being treated for UTI.  Started on 7/20 - will finish 7/26.  Pt took coumadin this morning.  Told pt to hold coumadin on Tuesday, take 1mg  on Wednesday and Thursday then resume regular dose on Thursday.  Appt made for INR check on Monday 7/30.  Pt verbalized understanding.

## 2016-09-11 NOTE — Telephone Encounter (Signed)
Pt wanted you to be aware that she's taking SULFAMETHOXAZOLE-TM DS TABLETS 1 TABLET BY MOUTH

## 2016-09-18 ENCOUNTER — Ambulatory Visit (INDEPENDENT_AMBULATORY_CARE_PROVIDER_SITE_OTHER): Payer: Medicare Other | Admitting: *Deleted

## 2016-09-18 DIAGNOSIS — Z7901 Long term (current) use of anticoagulants: Secondary | ICD-10-CM | POA: Diagnosis not present

## 2016-09-18 DIAGNOSIS — Z5181 Encounter for therapeutic drug level monitoring: Secondary | ICD-10-CM | POA: Diagnosis not present

## 2016-09-18 DIAGNOSIS — I4891 Unspecified atrial fibrillation: Secondary | ICD-10-CM

## 2016-09-18 LAB — POCT INR: INR: 1.6

## 2016-10-24 DIAGNOSIS — N3000 Acute cystitis without hematuria: Secondary | ICD-10-CM | POA: Diagnosis not present

## 2016-10-25 ENCOUNTER — Ambulatory Visit (INDEPENDENT_AMBULATORY_CARE_PROVIDER_SITE_OTHER): Payer: Medicare Other | Admitting: *Deleted

## 2016-10-25 DIAGNOSIS — I4891 Unspecified atrial fibrillation: Secondary | ICD-10-CM

## 2016-10-25 DIAGNOSIS — Z7901 Long term (current) use of anticoagulants: Secondary | ICD-10-CM | POA: Diagnosis not present

## 2016-10-25 DIAGNOSIS — Z5181 Encounter for therapeutic drug level monitoring: Secondary | ICD-10-CM

## 2016-10-25 LAB — POCT INR: INR: 2

## 2016-11-06 DIAGNOSIS — N201 Calculus of ureter: Secondary | ICD-10-CM | POA: Diagnosis not present

## 2016-11-22 ENCOUNTER — Encounter: Payer: Self-pay | Admitting: *Deleted

## 2016-12-04 ENCOUNTER — Ambulatory Visit (INDEPENDENT_AMBULATORY_CARE_PROVIDER_SITE_OTHER): Payer: Medicare Other | Admitting: *Deleted

## 2016-12-04 DIAGNOSIS — I4891 Unspecified atrial fibrillation: Secondary | ICD-10-CM | POA: Diagnosis not present

## 2016-12-04 DIAGNOSIS — Z7901 Long term (current) use of anticoagulants: Secondary | ICD-10-CM | POA: Diagnosis not present

## 2016-12-04 DIAGNOSIS — Z5181 Encounter for therapeutic drug level monitoring: Secondary | ICD-10-CM

## 2016-12-04 LAB — POCT INR: INR: 2.4

## 2016-12-07 DIAGNOSIS — Z23 Encounter for immunization: Secondary | ICD-10-CM | POA: Diagnosis not present

## 2016-12-18 DIAGNOSIS — R05 Cough: Secondary | ICD-10-CM | POA: Diagnosis not present

## 2016-12-29 DIAGNOSIS — E119 Type 2 diabetes mellitus without complications: Secondary | ICD-10-CM | POA: Diagnosis not present

## 2017-01-08 ENCOUNTER — Ambulatory Visit (INDEPENDENT_AMBULATORY_CARE_PROVIDER_SITE_OTHER): Payer: Medicare Other | Admitting: *Deleted

## 2017-01-08 ENCOUNTER — Other Ambulatory Visit: Payer: Self-pay | Admitting: Internal Medicine

## 2017-01-08 DIAGNOSIS — Z1231 Encounter for screening mammogram for malignant neoplasm of breast: Secondary | ICD-10-CM

## 2017-01-08 DIAGNOSIS — Z5181 Encounter for therapeutic drug level monitoring: Secondary | ICD-10-CM | POA: Diagnosis not present

## 2017-01-08 DIAGNOSIS — Z7901 Long term (current) use of anticoagulants: Secondary | ICD-10-CM | POA: Diagnosis not present

## 2017-01-08 DIAGNOSIS — I4891 Unspecified atrial fibrillation: Secondary | ICD-10-CM

## 2017-01-08 LAB — POCT INR: INR: 2.6

## 2017-01-16 DIAGNOSIS — E1129 Type 2 diabetes mellitus with other diabetic kidney complication: Secondary | ICD-10-CM | POA: Diagnosis not present

## 2017-01-16 DIAGNOSIS — I482 Chronic atrial fibrillation: Secondary | ICD-10-CM | POA: Diagnosis not present

## 2017-01-30 ENCOUNTER — Other Ambulatory Visit: Payer: Self-pay | Admitting: Cardiology

## 2017-02-01 DIAGNOSIS — R05 Cough: Secondary | ICD-10-CM | POA: Diagnosis not present

## 2017-02-05 ENCOUNTER — Ambulatory Visit
Admission: RE | Admit: 2017-02-05 | Discharge: 2017-02-05 | Disposition: A | Discharge status: Home / Self Care | Payer: Medicare Other | Source: Ambulatory Visit | Attending: Internal Medicine | Admitting: Internal Medicine

## 2017-02-05 DIAGNOSIS — Z1231 Encounter for screening mammogram for malignant neoplasm of breast: Secondary | ICD-10-CM | POA: Diagnosis not present

## 2017-02-22 DIAGNOSIS — H6692 Otitis media, unspecified, left ear: Secondary | ICD-10-CM | POA: Diagnosis not present

## 2017-02-26 ENCOUNTER — Ambulatory Visit (INDEPENDENT_AMBULATORY_CARE_PROVIDER_SITE_OTHER): Payer: Medicare Other | Admitting: *Deleted

## 2017-02-26 ENCOUNTER — Encounter: Payer: Self-pay | Admitting: *Deleted

## 2017-02-26 ENCOUNTER — Telehealth: Payer: Self-pay | Admitting: *Deleted

## 2017-02-26 DIAGNOSIS — Z7901 Long term (current) use of anticoagulants: Secondary | ICD-10-CM | POA: Diagnosis not present

## 2017-02-26 DIAGNOSIS — I4891 Unspecified atrial fibrillation: Secondary | ICD-10-CM

## 2017-02-26 DIAGNOSIS — Z5181 Encounter for therapeutic drug level monitoring: Secondary | ICD-10-CM | POA: Diagnosis not present

## 2017-02-26 LAB — POCT INR: INR: 1.6

## 2017-02-26 NOTE — Patient Instructions (Signed)
Take 1 tablet tonight.  If no more bleeding take 1 tablet tomorrow night then resume 1 tablet daily except 1/2 tablets on Tuesdays. Recheck 6 wks

## 2017-02-26 NOTE — Progress Notes (Signed)
This encounter was created in error - please disregard.

## 2017-02-26 NOTE — Telephone Encounter (Signed)
Please give pt a call @ [phone] been having rectal bleeding, bright red.

## 2017-02-26 NOTE — Telephone Encounter (Signed)
Pt has been on amoxicillin for ear infection.  Yesterday had some blood in stool when have a BM.  Pt stopped Amoxicillin and held coumadin last night on her own.  She will come in at 2:30pm for INR check.

## 2017-03-22 DIAGNOSIS — J019 Acute sinusitis, unspecified: Secondary | ICD-10-CM | POA: Diagnosis not present

## 2017-03-22 DIAGNOSIS — R05 Cough: Secondary | ICD-10-CM | POA: Diagnosis not present

## 2017-04-09 ENCOUNTER — Ambulatory Visit (INDEPENDENT_AMBULATORY_CARE_PROVIDER_SITE_OTHER): Payer: Medicare Other | Admitting: *Deleted

## 2017-04-09 DIAGNOSIS — Z7901 Long term (current) use of anticoagulants: Secondary | ICD-10-CM

## 2017-04-09 DIAGNOSIS — Z5181 Encounter for therapeutic drug level monitoring: Secondary | ICD-10-CM

## 2017-04-09 DIAGNOSIS — I4891 Unspecified atrial fibrillation: Secondary | ICD-10-CM

## 2017-04-09 LAB — POCT INR: INR: 2.5

## 2017-04-09 NOTE — Patient Instructions (Signed)
Continue coumadin 1 tablet daily except 1/2 tablets on Tuesdays. Recheck 6 wks

## 2017-04-30 DIAGNOSIS — E1129 Type 2 diabetes mellitus with other diabetic kidney complication: Secondary | ICD-10-CM | POA: Diagnosis not present

## 2017-05-07 DIAGNOSIS — I482 Chronic atrial fibrillation: Secondary | ICD-10-CM | POA: Diagnosis not present

## 2017-05-07 DIAGNOSIS — E1129 Type 2 diabetes mellitus with other diabetic kidney complication: Secondary | ICD-10-CM | POA: Diagnosis not present

## 2017-05-11 ENCOUNTER — Other Ambulatory Visit: Payer: Self-pay | Admitting: Cardiology

## 2017-05-11 NOTE — Telephone Encounter (Signed)
Refill has already been sent in. 

## 2017-05-11 NOTE — Telephone Encounter (Signed)
Patient needs refill on Warfarin sent to [facility] / tg

## 2017-05-14 ENCOUNTER — Other Ambulatory Visit: Payer: Self-pay | Admitting: Cardiology

## 2017-05-14 MED ORDER — WARFARIN SODIUM 2 MG PO TABS: ORAL_TABLET | ORAL | 1 refills | Status: DC

## 2017-05-14 NOTE — Telephone Encounter (Signed)
Patient needs one month supply of Warfarin sent to [facility] due to not being able to received it from [facility] before she goes out of town. / tg

## 2017-05-14 NOTE — Telephone Encounter (Signed)
Refilled rx for coumadin to local pharmacy per reqest

## 2017-05-18 ENCOUNTER — Other Ambulatory Visit: Payer: Self-pay | Admitting: Cardiology

## 2017-06-04 ENCOUNTER — Ambulatory Visit (INDEPENDENT_AMBULATORY_CARE_PROVIDER_SITE_OTHER): Payer: Medicare Other | Admitting: *Deleted

## 2017-06-04 DIAGNOSIS — Z5181 Encounter for therapeutic drug level monitoring: Secondary | ICD-10-CM

## 2017-06-04 DIAGNOSIS — Z7901 Long term (current) use of anticoagulants: Secondary | ICD-10-CM

## 2017-06-04 DIAGNOSIS — I4891 Unspecified atrial fibrillation: Secondary | ICD-10-CM

## 2017-06-04 LAB — POCT INR: INR: 2.4

## 2017-06-04 NOTE — Patient Instructions (Signed)
Continue coumadin 1 tablet daily except 1/2 tablets on Tuesdays. Recheck 6 wks

## 2017-06-10 ENCOUNTER — Encounter (HOSPITAL_COMMUNITY): Payer: Self-pay | Admitting: Emergency Medicine

## 2017-06-10 ENCOUNTER — Emergency Department (HOSPITAL_COMMUNITY)
Admission: EM | Admit: 2017-06-10 | Discharge: 2017-06-10 | Disposition: A | Discharge status: Home / Self Care | Payer: Medicare Other | Attending: Emergency Medicine | Admitting: Emergency Medicine

## 2017-06-10 ENCOUNTER — Emergency Department (HOSPITAL_COMMUNITY)
Admission: EM | Admit: 2017-06-10 | Discharge: 2017-06-10 | Discharge status: Absent without leave | Payer: Medicare Other | Source: Home / Self Care

## 2017-06-10 DIAGNOSIS — Z79899 Other long term (current) drug therapy: Secondary | ICD-10-CM | POA: Diagnosis not present

## 2017-06-10 DIAGNOSIS — I10 Essential (primary) hypertension: Secondary | ICD-10-CM | POA: Diagnosis not present

## 2017-06-10 DIAGNOSIS — R040 Epistaxis: Secondary | ICD-10-CM

## 2017-06-10 DIAGNOSIS — I4891 Unspecified atrial fibrillation: Secondary | ICD-10-CM | POA: Insufficient documentation

## 2017-06-10 DIAGNOSIS — Z7901 Long term (current) use of anticoagulants: Secondary | ICD-10-CM | POA: Insufficient documentation

## 2017-06-10 DIAGNOSIS — E119 Type 2 diabetes mellitus without complications: Secondary | ICD-10-CM | POA: Insufficient documentation

## 2017-06-10 DIAGNOSIS — E785 Hyperlipidemia, unspecified: Secondary | ICD-10-CM | POA: Diagnosis not present

## 2017-06-10 DIAGNOSIS — Z7984 Long term (current) use of oral hypoglycemic drugs: Secondary | ICD-10-CM | POA: Diagnosis not present

## 2017-06-10 LAB — PROTIME-INR
INR: 2.57
Prothrombin Time: 27.4 seconds — ABNORMAL HIGH (ref 11.4–15.2)

## 2017-06-10 MED ORDER — OXYMETAZOLINE HCL 0.05 % NA SOLN
NASAL | Status: AC
  Filled 2017-06-10: qty 15

## 2017-06-10 MED ORDER — OXYMETAZOLINE HCL 0.05 % NA SOLN
1.0000 | Freq: Once | NASAL | Status: AC
  Administered 2017-06-10: 1 via NASAL
  Filled 2017-06-10: qty 15

## 2017-06-10 NOTE — Discharge Instructions (Addendum)
If you have recurrent nosebleed please follow instructions below: Whichever side of your nose is bleeding across the other side and blow out forcefully to remove all the blood and clot there is bleeding. Right after blowing all the blood and clot spray 3-4 squirts of Afrin in the side that was bleeding until you feel it draining down the back of your throat.  Immediately hold pressure on the soft part of your nose for no less than 10 minutes.  If you accidentally let up start over again. If this does not work repeat steps 1 through 3 one time and if it is still bleeding after that return to the emergency department. Do not hold your head backwards.  Sometimes ice can help.

## 2017-06-10 NOTE — ED Provider Notes (Signed)
Emergency Department Provider Note   I have reviewed the triage vital signs and the nursing notes.   HISTORY  Chief Complaint Epistaxis   HPI Anne Wolf is a 84 y.o. female with multiple medical problems as documented below on Coumadin the presents to the emergency department with a nosebleed.  Patient states that she was at work in the yard did not have any trauma to her nose nor does she pick it or was it necessarily dry.  She notes that it was bleeding down the back of her throat so she applied ice and pressure and came here for further evaluation.  On my evaluation the bleeding is stopped.  States he denies any foreign bodies or nose did not drop pulling out any mucus or any bleeding elsewhere.  No lightheadedness or dizziness. No other associated or modifying symptoms.    Past Medical History:  Diagnosis Date  . Aortic stenosis    mild; mild MR; slightly increased pulmonary artery pressure with mild RVH; normal LV-2010  . Arthritis   . Atrial fibrillation (HCC)    chronic anticoagulation; adequate HR control on minimal AV nodal blocking medication   . Cancer ([place])    skin cancers removed from face  . Diabetes mellitus without complication ([place])    2  . Dysrhythmia    A FIB  . Fasting hyperglycemia    + microalbuminuria; A1c of 6.5% in 11/2010  . Hematochezia 01/2009   01/2009-presumed ischemic colitis; history of diverticulosis  . History of kidney stones   . Hx of adenomatous colonic polyps 2005   adenomatous polyp; negative colonoscopy in 2009  . Hyperlipidemia     Patient Active Problem List   Diagnosis Date Noted  . Encounter for therapeutic drug monitoring 04/16/2013  . History of diagnostic tests 08/07/2012  . Fasting hyperglycemia   . Aortic stenosis   . Hx of adenomatous colonic polyps   . Chronic anticoagulation 05/31/2010  . Hyperlipidemia 02/08/2009  . Hematochezia 01/20/2009  . Atrial fibrillation ([place]) 09/09/2008    Past Surgical History:    Procedure Laterality Date  . CATARACT EXTRACTION W/PHACO Right 03/23/2014   Procedure: CATARACT EXTRACTION PHACO AND INTRAOCULAR LENS PLACEMENT (IOC);  Surgeon: Williams Che, MD;  Location: [facility];  Service: Ophthalmology;  Laterality: Right;  CDE:2.83  . CATARACT EXTRACTION W/PHACO Left 07/27/2014   Procedure: CATARACT EXTRACTION PHACO AND INTRAOCULAR LENS PLACEMENT LEFT EYE CDE=6.97;  Surgeon: Williams Che, MD;  Location: [facility];  Service: Ophthalmology;  Laterality: Left;  . COLONOSCOPY  2009   negative; prior study with adenomatous polyp  . CYSTOSCOPY/URETEROSCOPY/HOLMIUM LASER/STENT PLACEMENT Right 08/21/2016   Procedure: CYSTOSCOPY/URETEROSCOPY/RETROGRADE PYELOGRAM//STENT PLACEMENT;  Surgeon: Nickie Retort, MD;  Location: [facility];  Service: Urology;  Laterality: Right;  . EYE SURGERY      Current Outpatient Rx  . Order #: [id number] Class: Historical Med  . Order #: [id number] Class: Historical Med  . Order #: [id number] Class: Historical Med  . Order #: [id number] Class: Historical Med  . Order #: [id number] Class: Historical Med  . Order #: [id number] Class: Historical Med  . Order #: [id number] Class: Historical Med  . Order #: [id number] Class: Historical Med  . Order #: [id number] Class: Historical Med  . Order #: [id number] Class: Normal  . Order #: [id number] Class: Historical Med    Allergies Patient has no known allergies.  Family History  Problem Relation Age of Onset  . Cancer Father   . Arrhythmia Sister        Atrial fibrillation  Social History Social History   Tobacco Use  . Smoking status: Never Smoker  . Smokeless tobacco: Never Used  Substance Use Topics  . Alcohol use: No  . Drug use: No    Review of Systems  All other systems negative except as documented in the HPI. All pertinent positives and negatives as reviewed in the HPI. ____________________________________________   PHYSICAL EXAM:  VITAL SIGNS: ED Triage Vitals [06/10/17 1853]  Enc Vitals  Group     BP (!) 156/78     Pulse Rate 94     Resp 18     Temp 97.9 F (36.6 C)     Temp Source Temporal     SpO2 98 %     Weight 174 lb (78.9 kg)    Constitutional: Alert and oriented. Well appearing and in no acute distress. Eyes: Conjunctivae are normal. PERRL. EOMI. Head: Atraumatic. Nose: No congestion/rhinnorhea. Dried blood in left nare. Mouth/Throat: Mucous membranes are moist.  Oropharynx non-erythematous. Neck: No stridor.  No meningeal signs.   Cardiovascular: Normal rate, regular rhythm. Good peripheral circulation. Grossly normal heart sounds.   Respiratory: Normal respiratory effort.  No retractions. Lungs CTAB. Gastrointestinal: Soft and nontender. No distention.  Musculoskeletal: No lower extremity tenderness nor edema. No gross deformities of extremities. Neurologic:  Normal speech and language. No gross focal neurologic deficits are appreciated.  Skin:  Skin is warm, dry and intact. No rash noted.   ____________________________________________   LABS (all labs ordered are listed, but only abnormal results are displayed)  Labs Reviewed  PROTIME-INR - Abnormal; Notable for the following components:      Result Value   Prothrombin Time 27.4 (*)    All other components within normal limits   ____________________________________________  RADIOLOGY  No results found.  ____________________________________________   PROCEDURES  Procedure(s) performed:   Procedures   ____________________________________________   INITIAL IMPRESSION / ASSESSMENT AND PLAN / ED COURSE  Hemostatic currently. Will check PT/INR and observe.  After initial period of observation bleeding had stopped.  INR within therapeutic range.  Patient was discharged and then bleeding started when she got outside so Afrin was used and pressure with improvement again observed for another hour and a half and hemostatic once again.   Pertinent labs & imaging results that were available  during my care of the patient were reviewed by me and considered in my medical decision making (see chart for details).  ____________________________________________  FINAL CLINICAL IMPRESSION(S) / ED DIAGNOSES  Final diagnoses:  Epistaxis     MEDICATIONS GIVEN DURING THIS VISIT:  Medications  oxymetazoline (AFRIN) 0.05 % nasal spray 1 spray (1 spray Each Nare Given 06/10/17 2008)     NEW OUTPATIENT MEDICATIONS STARTED DURING THIS VISIT:  Discharge Medication List as of 06/10/2017  8:04 PM      Note:  This note was prepared with assistance of Dragon voice recognition software. Occasional wrong-word or sound-a-like substitutions may have occurred due to the inherent limitations of voice recognition software.   Merrily Pew, MD 06/13/17 2322

## 2017-06-10 NOTE — ED Notes (Signed)
Pt was walking through the doorway of the er when her nose started bleeding again, pt brought back to room 1,

## 2017-06-10 NOTE — ED Triage Notes (Signed)
Pt reports her nose started bleeding about 10 minutes prior to arrival.  Appeared to be from left nare, but had pt apply pressure upon arrival to ED.

## 2017-06-10 NOTE — ED Notes (Signed)
ED Provider at bedside. 

## 2017-06-13 ENCOUNTER — Encounter (HOSPITAL_COMMUNITY): Payer: Self-pay | Admitting: Emergency Medicine

## 2017-06-13 ENCOUNTER — Emergency Department (HOSPITAL_COMMUNITY)
Admission: EM | Admit: 2017-06-13 | Discharge: 2017-06-13 | Disposition: A | Discharge status: Home / Self Care | Payer: Medicare Other | Attending: Emergency Medicine | Admitting: Emergency Medicine

## 2017-06-13 ENCOUNTER — Other Ambulatory Visit: Payer: Self-pay

## 2017-06-13 DIAGNOSIS — Z7984 Long term (current) use of oral hypoglycemic drugs: Secondary | ICD-10-CM | POA: Diagnosis not present

## 2017-06-13 DIAGNOSIS — T45515A Adverse effect of anticoagulants, initial encounter: Secondary | ICD-10-CM | POA: Diagnosis not present

## 2017-06-13 DIAGNOSIS — E119 Type 2 diabetes mellitus without complications: Secondary | ICD-10-CM | POA: Insufficient documentation

## 2017-06-13 DIAGNOSIS — D6832 Hemorrhagic disorder due to extrinsic circulating anticoagulants: Secondary | ICD-10-CM | POA: Insufficient documentation

## 2017-06-13 DIAGNOSIS — I4891 Unspecified atrial fibrillation: Secondary | ICD-10-CM | POA: Diagnosis not present

## 2017-06-13 DIAGNOSIS — Z7901 Long term (current) use of anticoagulants: Secondary | ICD-10-CM | POA: Insufficient documentation

## 2017-06-13 DIAGNOSIS — R040 Epistaxis: Secondary | ICD-10-CM | POA: Diagnosis not present

## 2017-06-13 DIAGNOSIS — Z79899 Other long term (current) drug therapy: Secondary | ICD-10-CM | POA: Insufficient documentation

## 2017-06-13 LAB — PROTIME-INR
INR: 2.58
Prothrombin Time: 27.4 seconds — ABNORMAL HIGH (ref 11.4–15.2)

## 2017-06-13 MED ORDER — TRANEXAMIC ACID 1000 MG/10ML IV SOLN
500.0000 mg | Freq: Once | INTRAVENOUS | Status: AC
  Administered 2017-06-13: 500 mg via TOPICAL
  Filled 2017-06-13 (×2): qty 10

## 2017-06-13 NOTE — ED Triage Notes (Signed)
Pt c/o intermittent nosebleeds since Sunday. Pt takes coumadin and bleeding to the left nare.

## 2017-06-13 NOTE — ED Provider Notes (Signed)
[facility] EMERGENCY DEPARTMENT Provider Note   CSN: [id number] Arrival date & time: 06/13/17  0014  Time seen 01:00 AM   History   Chief Complaint Chief Complaint  Patient presents with  . Epistaxis    HPI Anne Wolf is a 84 y.o. female.  HPI patient states she started having epistaxis mainly from her left nostril on April 21.  She was seen in the ED the first day.  She was given Afrin nasal spray to use at home.  She states the night before she had some bleeding but it stopped with the Afrin and holding pressure on her nose.  However about 10 PM tonight after using saline spray in her nose she started having bleeding again from the left nostril.  She has had to hold pressure multiple times in use the Afrin without stopping the bleeding.  She denies any prior sneezing or coughing.  She states she is on Coumadin and she has continued to take her Coumadin.  She took 1 pill yesterday and took a half a pill tonight which is her usual regimen.  Patient has a history of atrial fibrillation.  Her INR yesterday was 2.57.  PCP Asencion Noble, MD Cardiology Dr Harl Bowie  Past Medical History:  Diagnosis Date  . Aortic stenosis    mild; mild MR; slightly increased pulmonary artery pressure with mild RVH; normal LV-2010  . Arthritis   . Atrial fibrillation (HCC)    chronic anticoagulation; adequate HR control on minimal AV nodal blocking medication   . Cancer ([place])    skin cancers removed from face  . Diabetes mellitus without complication ([place])    2  . Dysrhythmia    A FIB  . Fasting hyperglycemia    + microalbuminuria; A1c of 6.5% in 11/2010  . Hematochezia 01/2009   01/2009-presumed ischemic colitis; history of diverticulosis  . History of kidney stones   . Hx of adenomatous colonic polyps 2005   adenomatous polyp; negative colonoscopy in 2009  . Hyperlipidemia     Patient Active Problem List   Diagnosis Date Noted  . Encounter for therapeutic drug monitoring 04/16/2013  .  History of diagnostic tests 08/07/2012  . Fasting hyperglycemia   . Aortic stenosis   . Hx of adenomatous colonic polyps   . Chronic anticoagulation 05/31/2010  . Hyperlipidemia 02/08/2009  . Hematochezia 01/20/2009  . Atrial fibrillation ([place]) 09/09/2008    Past Surgical History:  Procedure Laterality Date  . CATARACT EXTRACTION W/PHACO Right 03/23/2014   Procedure: CATARACT EXTRACTION PHACO AND INTRAOCULAR LENS PLACEMENT (IOC);  Surgeon: Williams Che, MD;  Location: [facility];  Service: Ophthalmology;  Laterality: Right;  CDE:2.83  . CATARACT EXTRACTION W/PHACO Left 07/27/2014   Procedure: CATARACT EXTRACTION PHACO AND INTRAOCULAR LENS PLACEMENT LEFT EYE CDE=6.97;  Surgeon: Williams Che, MD;  Location: [facility];  Service: Ophthalmology;  Laterality: Left;  . COLONOSCOPY  2009   negative; prior study with adenomatous polyp  . CYSTOSCOPY/URETEROSCOPY/HOLMIUM LASER/STENT PLACEMENT Right 08/21/2016   Procedure: CYSTOSCOPY/URETEROSCOPY/RETROGRADE PYELOGRAM//STENT PLACEMENT;  Surgeon: Nickie Retort, MD;  Location: [facility];  Service: Urology;  Laterality: Right;  . EYE SURGERY       OB History   None      Home Medications    Prior to Admission medications   Medication Sig Start Date End Date Taking? Authorizing Provider  calcium carbonate (OSCAL) 1500 (600 Ca) MG TABS tablet Take 600 mg of elemental calcium by mouth daily.    [provider]  cholecalciferol (VITAMIN D) 1000 units tablet Take 1,000 Units by mouth daily.    [provider]  Co-Enzyme Q-10 100 MG CAPS Take 1 capsule by mouth at bedtime.     [provider]  diclofenac sodium (VOLTAREN) 1 % GEL Apply 2 g topically daily as needed (pain).    [provider]  lisinopril (PRINIVIL,ZESTRIL) 10 MG tablet Take 10 mg by mouth every evening.     [provider]  loratadine (CLARITIN) 10 MG tablet Take 10 mg by mouth daily.    [provider]  metFORMIN (GLUCOPHAGE) 500 MG  tablet Take 500 mg by mouth 2 (two) times daily with a meal.     [provider]  metoprolol succinate (TOPROL-XL) 25 MG 24 hr tablet Take 12.5 mg by mouth 2 (two) times daily.     [provider]  Multiple Vitamins-Minerals (CENTRUM SILVER) tablet Take 1 tablet by mouth at bedtime.     [provider]  simvastatin (ZOCOR) 40 MG tablet Take 40 mg by mouth at bedtime.      [provider]  warfarin (COUMADIN) 2 MG tablet TAKE 1 TABLET BY MOUTH DAILY EXCEPT 1/2 TABLET ON TUESDAYS 05/18/17   Arnoldo Lenis, MD    Family History Family History  Problem Relation Age of Onset  . Cancer Father   . Arrhythmia Sister        Atrial fibrillation    Social History Social History   Tobacco Use  . Smoking status: Never Smoker  . Smokeless tobacco: Never Used  Substance Use Topics  . Alcohol use: No  . Drug use: No  lives at home Lives with spouse   Allergies   Patient has no known allergies.   Review of Systems Review of Systems  All other systems reviewed and are negative.    Physical Exam Updated Vital Signs BP (!) 161/77 (BP Location: Right Arm)   Pulse 100   Temp 97.9 F (36.6 C) (Axillary)   Resp 20   Ht 5\' 2"  (1.575 m)   Wt 78.9 kg (174 lb)   SpO2 94%   BMI 31.83 kg/m   Vital signs normal except for hypertension   Physical Exam  Constitutional: She is oriented to person, place, and time. She appears well-developed and well-nourished. She appears distressed.  Pt is holding her nose with a washcloth  HENT:  Head: Normocephalic and atraumatic.  Right Ear: External ear normal.  Left Ear: External ear normal.  When I inspect her nares I do not seen an obvious bleeding site  Eyes: Conjunctivae and EOM are normal.  Neck: Neck supple.  Cardiovascular: Normal rate.  Pulmonary/Chest: Effort normal. No respiratory distress.  Musculoskeletal: Normal range of motion.  Neurological: She is alert and oriented to person, place, and  time. No cranial nerve deficit.  Skin: Skin is warm and dry.  Psychiatric: She has a normal mood and affect. Her behavior is normal.  Nursing note and vitals reviewed.    ED Treatments / Results  Labs (all labs ordered are listed, but only abnormal results are displayed) Results for orders placed or performed during the hospital encounter of 06/13/17  Protime-INR  Result Value Ref Range   Prothrombin Time 27.4 (H) 11.4 - 15.2 seconds   INR 2.58    Laboratory interpretation all normal except coagulopathy from coumadin    EKG None  Radiology No results found.  Procedures Procedures (including critical care time)  Medications Ordered in ED Medications  tranexamic  acid (CYKLOKAPRON) injection 500 mg (500 mg Topical Given by Other 06/13/17 0142)     Initial Impression / Assessment and Plan / ED Course  I have reviewed the triage vital signs and the nursing notes.  Pertinent labs & imaging results that were available during my care of the patient were reviewed by me and considered in my medical decision making (see chart for details).      TXA was placed on a cotton ball and placed in her left nostril  Recheck at 02:30 AM she has had no further bleeding after the TXA was inserted in her left nostril.  She was discharged home.  She was referred to Dr. Benjamine Mola, ENT.  Her INR is on the high side with her having the nasal bleeding, she was advised to not take her dose tonight, the 24th.  We discussed the most likely etiology of the bleeding is due to the high pollen currently happening and she probably has a capillary blood vessel bleed.   Final Clinical Impressions(s) / ED Diagnoses   Final diagnoses:  Left-sided epistaxis  Warfarin-induced coagulopathy [facility])    ED Discharge Orders    None      Plan discharge  Rolland Porter, MD, Barbette Or, MD 06/13/17 [phone]

## 2017-06-13 NOTE — ED Notes (Signed)
AC called by Darrick Penna, Sec for this medicine

## 2017-06-13 NOTE — Discharge Instructions (Addendum)
Don't take your coumadin tonight (Wednesday). Spray the liquid tranexamic acid on a cotton ball and place in your nostril if the bleeding recurs. Consider seeing Dr Benjamine Mola, and ENT specialist if you continue to have bleeding.

## 2017-07-09 ENCOUNTER — Ambulatory Visit (INDEPENDENT_AMBULATORY_CARE_PROVIDER_SITE_OTHER): Payer: Medicare Other | Admitting: Otolaryngology

## 2017-07-09 DIAGNOSIS — R040 Epistaxis: Secondary | ICD-10-CM

## 2017-07-23 ENCOUNTER — Ambulatory Visit: Payer: Medicare Other | Admitting: *Deleted

## 2017-07-23 DIAGNOSIS — Z5181 Encounter for therapeutic drug level monitoring: Secondary | ICD-10-CM | POA: Diagnosis not present

## 2017-07-23 DIAGNOSIS — Z7901 Long term (current) use of anticoagulants: Secondary | ICD-10-CM | POA: Diagnosis not present

## 2017-07-23 DIAGNOSIS — I4891 Unspecified atrial fibrillation: Secondary | ICD-10-CM

## 2017-07-23 LAB — POCT INR: INR: 4.7 — AB (ref 2.0–3.0)

## 2017-07-23 NOTE — Patient Instructions (Signed)
Continue coumadin 1 tablet daily except 1/2 tablets on Tuesdays. Recheck 2-3 wks

## 2017-07-30 ENCOUNTER — Other Ambulatory Visit: Payer: Self-pay | Admitting: Cardiology

## 2017-08-08 ENCOUNTER — Ambulatory Visit (INDEPENDENT_AMBULATORY_CARE_PROVIDER_SITE_OTHER): Payer: Medicare Other | Admitting: *Deleted

## 2017-08-08 DIAGNOSIS — I4891 Unspecified atrial fibrillation: Secondary | ICD-10-CM | POA: Diagnosis not present

## 2017-08-08 DIAGNOSIS — Z5181 Encounter for therapeutic drug level monitoring: Secondary | ICD-10-CM

## 2017-08-08 DIAGNOSIS — Z7901 Long term (current) use of anticoagulants: Secondary | ICD-10-CM

## 2017-08-08 LAB — POCT INR: INR: 3.7 — AB (ref 2.0–3.0)

## 2017-08-08 NOTE — Patient Instructions (Signed)
Hold coumadin tonight then decrease dose to 1 tablet daily except 1/2 tablets on Tuesdays and Fridays Recheck 3 wks

## 2017-08-29 ENCOUNTER — Ambulatory Visit (INDEPENDENT_AMBULATORY_CARE_PROVIDER_SITE_OTHER): Payer: Medicare Other | Admitting: *Deleted

## 2017-08-29 DIAGNOSIS — Z7901 Long term (current) use of anticoagulants: Secondary | ICD-10-CM

## 2017-08-29 DIAGNOSIS — Z5181 Encounter for therapeutic drug level monitoring: Secondary | ICD-10-CM | POA: Diagnosis not present

## 2017-08-29 DIAGNOSIS — I4891 Unspecified atrial fibrillation: Secondary | ICD-10-CM | POA: Diagnosis not present

## 2017-08-29 LAB — POCT INR: INR: 3 (ref 2.0–3.0)

## 2017-08-29 NOTE — Patient Instructions (Signed)
Continue coumadin 1 tablet daily except 1/2 tablets on Tuesdays and Fridays Recheck 4 wks

## 2017-08-31 ENCOUNTER — Ambulatory Visit: Payer: Medicare Other | Admitting: Cardiology

## 2017-09-10 DIAGNOSIS — Z961 Presence of intraocular lens: Secondary | ICD-10-CM | POA: Diagnosis not present

## 2017-09-10 DIAGNOSIS — E119 Type 2 diabetes mellitus without complications: Secondary | ICD-10-CM | POA: Diagnosis not present

## 2017-09-10 DIAGNOSIS — H04123 Dry eye syndrome of bilateral lacrimal glands: Secondary | ICD-10-CM | POA: Diagnosis not present

## 2017-09-10 DIAGNOSIS — H57813 Brow ptosis, bilateral: Secondary | ICD-10-CM | POA: Diagnosis not present

## 2017-09-26 ENCOUNTER — Ambulatory Visit (INDEPENDENT_AMBULATORY_CARE_PROVIDER_SITE_OTHER): Payer: Medicare Other | Admitting: *Deleted

## 2017-09-26 DIAGNOSIS — Z7901 Long term (current) use of anticoagulants: Secondary | ICD-10-CM | POA: Diagnosis not present

## 2017-09-26 DIAGNOSIS — I4891 Unspecified atrial fibrillation: Secondary | ICD-10-CM

## 2017-09-26 DIAGNOSIS — Z5181 Encounter for therapeutic drug level monitoring: Secondary | ICD-10-CM

## 2017-09-26 LAB — POCT INR: INR: 3.5 — AB (ref 2.0–3.0)

## 2017-09-26 NOTE — Patient Instructions (Signed)
Hold coumadin tonight then decrease dose to 1 tablet daily except 1/2 tablets on Mondays, Wednesdays and Fridays.  Recheck 3 wks

## 2017-10-16 ENCOUNTER — Ambulatory Visit: Payer: Self-pay | Admitting: *Deleted

## 2017-10-16 ENCOUNTER — Ambulatory Visit: Payer: Medicare Other | Admitting: Cardiology

## 2017-10-16 ENCOUNTER — Encounter: Payer: Self-pay | Admitting: Cardiology

## 2017-10-16 VITALS — BP 142/78 | HR 95 | Ht 61.0 in | Wt 176.0 lb

## 2017-10-16 DIAGNOSIS — I350 Nonrheumatic aortic (valve) stenosis: Secondary | ICD-10-CM | POA: Diagnosis not present

## 2017-10-16 DIAGNOSIS — E782 Mixed hyperlipidemia: Secondary | ICD-10-CM | POA: Diagnosis not present

## 2017-10-16 DIAGNOSIS — I4891 Unspecified atrial fibrillation: Secondary | ICD-10-CM

## 2017-10-16 LAB — POCT INR: INR: 2.5 (ref 2.0–3.0)

## 2017-10-16 MED ORDER — APIXABAN 5 MG PO TABS: 5.0000 mg | ORAL_TABLET | Freq: Two times a day (BID) | ORAL | 3 refills | Status: DC

## 2017-10-16 NOTE — Patient Instructions (Addendum)
Medication Instructions:  STOP COUMADIN AFTER BEING OFF COUMADIN FOR 3 DAYS, START ELIQUIS 5 MG - TWO TIMES DAILY   Labwork: I WILL REQUEST LABS FROM PCP  Testing/Procedures: NONE  Follow-Up: Your physician recommends that you schedule a follow-up appointment: WITH Ema Hebner REID, RN IN 6 WEEKS  Your physician wants you to follow-up in: 1 YEAR.  You will receive a reminder letter in the mail two months in advance. If you don't receive a letter, please call our office to schedule the follow-up appointment.   Any Other Special Instructions Will Be Listed Below (If Applicable).     If you need a refill on your cardiac medications before your next appointment, please call your pharmacy.

## 2017-10-16 NOTE — Progress Notes (Signed)
Clinical Summary Anne Wolf is a 84 y.o.female seen today for follow up of the following medical problems.   1. Afib - no recent palpitations - compliant with meds.   - no recent palpitations. Compliant with meds.  - isolated nose bleed, no recurrent issues. Remains on coumadin. Was seen by ENT and had cautery - has some interested in NOACs   2. Hyperlipidemia - compliant with meds  3. Aortic stenosis - Mild to moderate by echo 10/2013. Mean grad 11, ava 1.24 08/2016 echo LVEF 55-60%, mild to mod AS. Mean grad 17, AVA VTI 1.09  - no recent symptoms.       SH: recent trip to [place], annual trip for her family. Heading up to [place] to visit son and grandkids, to help babysit  Past Medical History:  Diagnosis Date  . Aortic stenosis    mild; mild MR; slightly increased pulmonary artery pressure with mild RVH; normal LV-2010  . Arthritis   . Atrial fibrillation (HCC)    chronic anticoagulation; adequate HR control on minimal AV nodal blocking medication   . Cancer ([place])    skin cancers removed from face  . Diabetes mellitus without complication ([place])    2  . Dysrhythmia    A FIB  . Fasting hyperglycemia    + microalbuminuria; A1c of 6.5% in 11/2010  . Hematochezia 01/2009   01/2009-presumed ischemic colitis; history of diverticulosis  . History of kidney stones   . Hx of adenomatous colonic polyps 2005   adenomatous polyp; negative colonoscopy in 2009  . Hyperlipidemia      No Known Allergies   Current Outpatient Medications  Medication Sig Dispense Refill  . calcium carbonate (OSCAL) 1500 (600 Ca) MG TABS tablet Take 600 mg of elemental calcium by mouth daily.    . cholecalciferol (VITAMIN D) 1000 units tablet Take 1,000 Units by mouth daily.    Marland Kitchen Co-Enzyme Q-10 100 MG CAPS Take 1 capsule by mouth at bedtime.     . diclofenac sodium (VOLTAREN) 1 % GEL Apply 2 g topically daily as needed (pain).    Marland Kitchen lisinopril (PRINIVIL,ZESTRIL) 10 MG  tablet Take 10 mg by mouth every evening.     . loratadine (CLARITIN) 10 MG tablet Take 10 mg by mouth daily.    . metFORMIN (GLUCOPHAGE) 500 MG tablet Take 500 mg by mouth 2 (two) times daily with a meal.     . metoprolol succinate (TOPROL-XL) 25 MG 24 hr tablet Take 12.5 mg by mouth 2 (two) times daily.     . Multiple Vitamins-Minerals (CENTRUM SILVER) tablet Take 1 tablet by mouth at bedtime.     . simvastatin (ZOCOR) 40 MG tablet Take 40 mg by mouth at bedtime.      Marland Kitchen warfarin (COUMADIN) 2 MG tablet TAKE 1 TABLET BY MOUTH DAILY EXCEPT 1/2 TABLET ON TUESDAYS 30 tablet 3  . warfarin (COUMADIN) 2 MG tablet TAKE 1 TABLET BY MOUTH  DAILY EXCEPT 1/2 TABLET ON  TUESDAYS 90 tablet 3   No current facility-administered medications for this visit.      Past Surgical History:  Procedure Laterality Date  . CATARACT EXTRACTION W/PHACO Right 03/23/2014   Procedure: CATARACT EXTRACTION PHACO AND INTRAOCULAR LENS PLACEMENT (IOC);  Surgeon: Williams Che, MD;  Location: [facility];  Service: Ophthalmology;  Laterality: Right;  CDE:2.83  . CATARACT EXTRACTION W/PHACO Left 07/27/2014   Procedure: CATARACT EXTRACTION PHACO AND INTRAOCULAR LENS PLACEMENT LEFT EYE CDE=6.97;  Surgeon: Debe Coder  Iona Hansen, MD;  Location: [facility];  Service: Ophthalmology;  Laterality: Left;  . COLONOSCOPY  2009   negative; prior study with adenomatous polyp  . CYSTOSCOPY/URETEROSCOPY/HOLMIUM LASER/STENT PLACEMENT Right 08/21/2016   Procedure: CYSTOSCOPY/URETEROSCOPY/RETROGRADE PYELOGRAM//STENT PLACEMENT;  Surgeon: Nickie Retort, MD;  Location: [facility];  Service: Urology;  Laterality: Right;  . EYE SURGERY       No Known Allergies    Family History  Problem Relation Age of Onset  . Cancer Father   . Arrhythmia Sister        Atrial fibrillation     Social History Anne Wolf reports that she has never smoked. She has never used smokeless tobacco. Anne Wolf reports that she does not drink alcohol.   Review of  Systems CONSTITUTIONAL: No weight loss, fever, chills, weakness or fatigue.  HEENT: Eyes: No visual loss, blurred vision, double vision or yellow sclerae.No hearing loss, sneezing, congestion, runny nose or sore throat.  SKIN: No rash or itching.  CARDIOVASCULAR: per hpi RESPIRATORY: No shortness of breath, cough or sputum.  GASTROINTESTINAL: No anorexia, nausea, vomiting or diarrhea. No abdominal pain or blood.  GENITOURINARY: No burning on urination, no polyuria NEUROLOGICAL: No headache, dizziness, syncope, paralysis, ataxia, numbness or tingling in the extremities. No change in bowel or bladder control.  MUSCULOSKELETAL: No muscle, back pain, joint pain or stiffness.  LYMPHATICS: No enlarged nodes. No history of splenectomy.  PSYCHIATRIC: No history of depression or anxiety.  ENDOCRINOLOGIC: No reports of sweating, cold or heat intolerance. No polyuria or polydipsia.  Marland Kitchen   Physical Examination Vitals:   10/16/17 1030  BP: (!) 142/78  Pulse: 95  SpO2: 97%   Vitals:   10/16/17 1030  Weight: 176 lb (79.8 kg)  Height: _0  (1.549 m)    Gen: resting comfortably, no acute distress HEENT: no scleral icterus, pupils equal round and reactive, no palptable cervical adenopathy,  CV: irreg, 3/6 systolic murmur rusb, no jvd Resp: Clear to auscultation bilaterally GI: abdomen is soft, non-tender, non-distended, normal bowel sounds, no hepatosplenomegaly MSK: extremities are warm, no edema.  Skin: warm, no rash Neuro:  no focal deficits Psych: appropriate affect   Diagnostic Studies  10/2013 Echo Study Conclusions  - Left ventricle: The cavity size was normal. Wall thickness was increased in a pattern of moderate LVH. Systolic function was normal. The estimated ejection fraction was in the range of 55% to 60%. Wall motion was normal; there were no regional wall motion abnormalities. Doppler parameters are consistent with elevated ventricular end-diastolic filling  pressure. - Aortic valve: Mildly calcified annulus. Trileaflet; mildly to moderately calcified leaflets. Cusp separation was mildly reduced. There was mild to moderate stenosis. There was mild regurgitation. Mean gradient (S): 11 mm Hg. Peak gradient (S): 23 mm Hg. VTI ratio of LVOT to aortic valve: 0.4. Valve area (VTI): 1.24 cm^2. Valve area (Vmax): 1.32 cm^2. Planimetry valve area 1.8 square cm. - Mitral valve: Calcified annulus. There was mild regurgitation. Valve area by pressure half-time: 2.2 cm^2. - Left atrium: The atrium was severely dilated. - Right atrium: The atrium was severely dilated. Central venous pressure (est): 3 mm Hg. - Atrial septum: No defect or patent foramen ovale was identified. - Tricuspid valve: There was mild regurgitation. - Pulmonary arteries: PA peak pressure: 35 mm Hg (S). - Pericardium, extracardiac: There was no pericardial effusion.  Impressions:  - Moderate LVH with LVEF 55-60%, indeterminate diastolic function with evidence of increased LVEDP. Severe biatrial enlargement. Moderate MAC with mild mitral regurgitation. Mild to  moderate, calcific aortic stenosis as outlined above with mild aortic regurgitation. Mild tricuspid regurgitation with PASP 35 mmHg.  08/2016 echo Study Conclusions  - Left ventricle: The cavity size was normal. Wall thickness was   increased in a pattern of mild LVH. Systolic function was normal.   The estimated ejection fraction was in the range of 55% to 60%. - Aortic valve: AV is thickened, calcified with mildly restricted   motion Peak and mean gradients through the valve are 30 and 17 mm   Hg respectively. There was mild regurgitation. - Mitral valve: Calcified annulus. Mildly thickened leaflets .   There was mild regurgitation. - Left atrium: The atrium was mildly dilated. - Right atrium: The atrium was mildly dilated. - Pulmonary arteries: PA peak pressure: 49 mm Hg (S).   Assessment  and Plan  1. Afib - no symptoms, continue current meds - she is interested in changing to eliquis. Stop coumadin wait 3 days then start eliquis 33m bid  2. Hyperlipidemia - continue statin, request labs from pcp   3. Aortic stenosis - mild to moderate by last [place] repeat next year   F/u 1 year      JArnoldo Lenis M.D.

## 2017-11-28 ENCOUNTER — Ambulatory Visit (INDEPENDENT_AMBULATORY_CARE_PROVIDER_SITE_OTHER): Payer: Medicare Other | Admitting: *Deleted

## 2017-11-28 ENCOUNTER — Other Ambulatory Visit (HOSPITAL_COMMUNITY)
Admission: RE | Admit: 2017-11-28 | Discharge: 2017-11-28 | Disposition: A | Discharge status: Home / Self Care | Payer: Medicare Other | Source: Ambulatory Visit | Attending: Cardiology | Admitting: Cardiology

## 2017-11-28 DIAGNOSIS — I4891 Unspecified atrial fibrillation: Secondary | ICD-10-CM | POA: Diagnosis not present

## 2017-11-28 DIAGNOSIS — Z5181 Encounter for therapeutic drug level monitoring: Secondary | ICD-10-CM | POA: Diagnosis not present

## 2017-11-28 LAB — CBC
HCT: 42.5 % (ref 36.0–46.0)
Hemoglobin: 13.2 g/dL (ref 12.0–15.0)
MCH: 28.1 pg (ref 26.0–34.0)
MCHC: 31.1 g/dL (ref 30.0–36.0)
MCV: 90.6 fL (ref 80.0–100.0)
PLATELETS: 222 10*3/uL (ref 150–400)
RBC: 4.69 MIL/uL (ref 3.87–5.11)
RDW: 14 % (ref 11.5–15.5)
WBC: 7.2 10*3/uL (ref 4.0–10.5)
nRBC: 0 % (ref 0.0–0.2)

## 2017-11-28 LAB — BASIC METABOLIC PANEL
ANION GAP: 7 (ref 5–15)
BUN: 13 mg/dL (ref 8–23)
CALCIUM: 9.4 mg/dL (ref 8.9–10.3)
CO2: 29 mmol/L (ref 22–32)
CREATININE: 0.9 mg/dL (ref 0.44–1.00)
Chloride: 103 mmol/L (ref 98–111)
GFR calc Af Amer: >60 mL/min (ref >60)
GFR, EST NON AFRICAN AMERICAN: 58 mL/min — AB (ref >60)
GLUCOSE: 106 mg/dL — AB (ref 70–99)
Potassium: 4.5 mmol/L (ref 3.5–5.1)
Sodium: 139 mmol/L (ref 135–145)

## 2017-11-28 NOTE — Progress Notes (Signed)
Pt was started on Eliquis 5mg  2 x daily for atrial fibrillation on 10/19/17 by Dr Harl Bowie.  She was previously on Warfarin..    Pt denies any problems since starting Eliquis.    Reviewed patients medication list.  Pt is not currently on any combined P-gp and strong CYP3A4 inhibitors/inducers (ketoconazole, traconazole, ritonavir, carbamazepine, phenytoin, rifampin, St. John's wort).  Reviewed labs from 11/28/17.  SCr 0.90, Weight 81kg, CrCl 62.69.  Dose is appropriate based on age, weight, and SCr.  Hgb and HCT: 13.2/42.5  A full discussion of the nature of anticoagulants has been carried out.  A benefit/risk analysis has been presented to the patient, so that they understand the justification for choosing anticoagulation with Eliquis at this time.  The need for compliance is stressed.  Pt is aware to take the medication twice daily.  Side effects of potential bleeding are discussed, including unusual colored urine or stools, coughing up blood or coffee ground emesis, nose bleeds or serious fall or head trauma.  Discussed signs and symptoms of stroke. The patient should avoid any OTC items containing aspirin or ibuprofen.  Avoid alcohol consumption.   Call if any signs of abnormal bleeding.  Discussed financial obligations and resolved any difficulty in obtaining medication.  Next lab test in 6 months.   LMOM for pt with lab results per her request.  F/U in 6 months.

## 2017-12-21 ENCOUNTER — Other Ambulatory Visit (HOSPITAL_COMMUNITY): Payer: Self-pay | Admitting: Internal Medicine

## 2017-12-21 DIAGNOSIS — R310 Gross hematuria: Secondary | ICD-10-CM

## 2017-12-27 ENCOUNTER — Ambulatory Visit (HOSPITAL_COMMUNITY)
Admission: RE | Admit: 2017-12-27 | Discharge: 2017-12-27 | Disposition: A | Discharge status: Home / Self Care | Payer: Medicare Other | Source: Ambulatory Visit | Attending: Internal Medicine | Admitting: Internal Medicine

## 2017-12-27 DIAGNOSIS — R310 Gross hematuria: Secondary | ICD-10-CM | POA: Diagnosis present

## 2017-12-27 DIAGNOSIS — I700 Atherosclerosis of aorta: Secondary | ICD-10-CM | POA: Insufficient documentation

## 2017-12-27 DIAGNOSIS — N200 Calculus of kidney: Secondary | ICD-10-CM | POA: Insufficient documentation

## 2018-01-03 DIAGNOSIS — E785 Hyperlipidemia, unspecified: Secondary | ICD-10-CM | POA: Diagnosis not present

## 2018-01-03 DIAGNOSIS — Z79899 Other long term (current) drug therapy: Secondary | ICD-10-CM | POA: Diagnosis not present

## 2018-01-03 DIAGNOSIS — R809 Proteinuria, unspecified: Secondary | ICD-10-CM | POA: Diagnosis not present

## 2018-01-03 DIAGNOSIS — I482 Chronic atrial fibrillation, unspecified: Secondary | ICD-10-CM | POA: Diagnosis not present

## 2018-01-03 DIAGNOSIS — E1129 Type 2 diabetes mellitus with other diabetic kidney complication: Secondary | ICD-10-CM | POA: Diagnosis not present

## 2018-01-10 DIAGNOSIS — Z23 Encounter for immunization: Secondary | ICD-10-CM | POA: Diagnosis not present

## 2018-01-10 DIAGNOSIS — Z0001 Encounter for general adult medical examination with abnormal findings: Secondary | ICD-10-CM | POA: Diagnosis not present

## 2018-01-10 DIAGNOSIS — I482 Chronic atrial fibrillation, unspecified: Secondary | ICD-10-CM | POA: Diagnosis not present

## 2018-01-10 DIAGNOSIS — E1129 Type 2 diabetes mellitus with other diabetic kidney complication: Secondary | ICD-10-CM | POA: Diagnosis not present

## 2018-01-10 DIAGNOSIS — E785 Hyperlipidemia, unspecified: Secondary | ICD-10-CM | POA: Diagnosis not present

## 2018-02-01 ENCOUNTER — Other Ambulatory Visit: Payer: Self-pay | Admitting: Internal Medicine

## 2018-02-01 DIAGNOSIS — Z1231 Encounter for screening mammogram for malignant neoplasm of breast: Secondary | ICD-10-CM

## 2018-03-11 ENCOUNTER — Ambulatory Visit
Admission: RE | Admit: 2018-03-11 | Discharge: 2018-03-11 | Disposition: A | Discharge status: Home / Self Care | Payer: Medicare Other | Source: Ambulatory Visit | Attending: Internal Medicine | Admitting: Internal Medicine

## 2018-03-11 DIAGNOSIS — Z1231 Encounter for screening mammogram for malignant neoplasm of breast: Secondary | ICD-10-CM | POA: Diagnosis not present

## 2018-04-24 DIAGNOSIS — I447 Left bundle-branch block, unspecified: Secondary | ICD-10-CM | POA: Diagnosis not present

## 2018-04-24 DIAGNOSIS — R0600 Dyspnea, unspecified: Secondary | ICD-10-CM | POA: Diagnosis not present

## 2018-04-24 DIAGNOSIS — E119 Type 2 diabetes mellitus without complications: Secondary | ICD-10-CM | POA: Diagnosis not present

## 2018-04-24 DIAGNOSIS — R0602 Shortness of breath: Secondary | ICD-10-CM | POA: Diagnosis not present

## 2018-04-24 DIAGNOSIS — I4891 Unspecified atrial fibrillation: Secondary | ICD-10-CM | POA: Diagnosis not present

## 2018-04-24 DIAGNOSIS — Z79899 Other long term (current) drug therapy: Secondary | ICD-10-CM | POA: Diagnosis not present

## 2018-04-24 DIAGNOSIS — I10 Essential (primary) hypertension: Secondary | ICD-10-CM | POA: Diagnosis not present

## 2018-04-24 DIAGNOSIS — R0789 Other chest pain: Secondary | ICD-10-CM | POA: Diagnosis not present

## 2018-04-24 DIAGNOSIS — R9431 Abnormal electrocardiogram [ECG] [EKG]: Secondary | ICD-10-CM | POA: Diagnosis not present

## 2018-04-24 DIAGNOSIS — Z7984 Long term (current) use of oral hypoglycemic drugs: Secondary | ICD-10-CM | POA: Diagnosis not present

## 2018-04-24 DIAGNOSIS — Z91048 Other nonmedicinal substance allergy status: Secondary | ICD-10-CM | POA: Diagnosis not present

## 2018-04-24 DIAGNOSIS — Z7901 Long term (current) use of anticoagulants: Secondary | ICD-10-CM | POA: Diagnosis not present

## 2018-04-24 DIAGNOSIS — K30 Functional dyspepsia: Secondary | ICD-10-CM | POA: Diagnosis not present

## 2018-04-24 DIAGNOSIS — M810 Age-related osteoporosis without current pathological fracture: Secondary | ICD-10-CM | POA: Diagnosis not present

## 2018-04-25 DIAGNOSIS — R9431 Abnormal electrocardiogram [ECG] [EKG]: Secondary | ICD-10-CM | POA: Diagnosis not present

## 2018-04-25 DIAGNOSIS — I10 Essential (primary) hypertension: Secondary | ICD-10-CM | POA: Diagnosis not present

## 2018-04-25 DIAGNOSIS — I4819 Other persistent atrial fibrillation: Secondary | ICD-10-CM | POA: Diagnosis not present

## 2018-04-25 DIAGNOSIS — R0602 Shortness of breath: Secondary | ICD-10-CM | POA: Diagnosis not present

## 2018-04-25 DIAGNOSIS — E1159 Type 2 diabetes mellitus with other circulatory complications: Secondary | ICD-10-CM | POA: Diagnosis not present

## 2018-04-30 DIAGNOSIS — E1129 Type 2 diabetes mellitus with other diabetic kidney complication: Secondary | ICD-10-CM | POA: Diagnosis not present

## 2018-04-30 DIAGNOSIS — E875 Hyperkalemia: Secondary | ICD-10-CM | POA: Diagnosis not present

## 2018-04-30 DIAGNOSIS — Z79899 Other long term (current) drug therapy: Secondary | ICD-10-CM | POA: Diagnosis not present

## 2018-05-14 ENCOUNTER — Telehealth: Payer: Self-pay | Admitting: Student

## 2018-05-14 DIAGNOSIS — R0602 Shortness of breath: Secondary | ICD-10-CM | POA: Diagnosis not present

## 2018-05-14 DIAGNOSIS — E1129 Type 2 diabetes mellitus with other diabetic kidney complication: Secondary | ICD-10-CM | POA: Diagnosis not present

## 2018-05-14 DIAGNOSIS — E875 Hyperkalemia: Secondary | ICD-10-CM | POA: Diagnosis not present

## 2018-05-14 DIAGNOSIS — I482 Chronic atrial fibrillation, unspecified: Secondary | ICD-10-CM | POA: Diagnosis not present

## 2018-05-14 NOTE — Telephone Encounter (Signed)
   Cardiac Questionnaire:    Since your last visit or hospitalization:    1. Have you been having new or worsening chest pain? No   2. Have you been having new or worsening shortness of breath? No 3. Have you been having new or worsening leg swelling, wt gain, or increase in abdominal girth (pants fitting more tightly)? No   4. Have you had any passing out spells? No    *A YES to any of these questions would result in the appointment being kept. *If all the answers to these questions are NO, we should indicate that given the current situation regarding the worldwide coronarvirus pandemic, at the recommendation of the CDC, we are looking to limit gatherings in our waiting area, and thus will reschedule their appointment beyond four weeks from today.   _____________    Hospitalized for allergic reaction earlier this month in [place]. Was told she was in atrial fibrillation at that time which is not a new diagnosis for the patient. Was continued on her PTA medication regimen. Has been checking HR and BP at home which has been within a normal range. Can cancel visit for 3/25 and reschedule for 2-3 months from now.   Signed, Erma Heritage, PA-C 05/14/2018, 9:58 AM Pager: [phone]

## 2018-05-15 ENCOUNTER — Ambulatory Visit: Payer: Medicare Other | Admitting: Student

## 2018-05-26 DIAGNOSIS — N200 Calculus of kidney: Secondary | ICD-10-CM | POA: Insufficient documentation

## 2018-05-26 DIAGNOSIS — R310 Gross hematuria: Secondary | ICD-10-CM | POA: Insufficient documentation

## 2018-05-31 DIAGNOSIS — N200 Calculus of kidney: Secondary | ICD-10-CM | POA: Diagnosis not present

## 2018-05-31 DIAGNOSIS — R310 Gross hematuria: Secondary | ICD-10-CM | POA: Diagnosis not present

## 2018-06-05 ENCOUNTER — Other Ambulatory Visit: Payer: Self-pay | Admitting: Urology

## 2018-06-05 DIAGNOSIS — R310 Gross hematuria: Secondary | ICD-10-CM

## 2018-06-12 DIAGNOSIS — N200 Calculus of kidney: Secondary | ICD-10-CM | POA: Diagnosis not present

## 2018-06-19 DIAGNOSIS — N200 Calculus of kidney: Secondary | ICD-10-CM | POA: Diagnosis not present

## 2018-06-25 DIAGNOSIS — N200 Calculus of kidney: Secondary | ICD-10-CM | POA: Diagnosis not present

## 2018-07-08 DIAGNOSIS — N390 Urinary tract infection, site not specified: Secondary | ICD-10-CM | POA: Diagnosis not present

## 2018-07-31 ENCOUNTER — Ambulatory Visit (INDEPENDENT_AMBULATORY_CARE_PROVIDER_SITE_OTHER): Payer: Medicare Other | Admitting: Student

## 2018-07-31 ENCOUNTER — Encounter: Payer: Self-pay | Admitting: Student

## 2018-07-31 ENCOUNTER — Other Ambulatory Visit: Payer: Self-pay

## 2018-07-31 VITALS — BP 126/76 | HR 93 | Temp 96.7°F | Wt 177.0 lb

## 2018-07-31 DIAGNOSIS — E782 Mixed hyperlipidemia: Secondary | ICD-10-CM

## 2018-07-31 DIAGNOSIS — I4819 Other persistent atrial fibrillation: Secondary | ICD-10-CM

## 2018-07-31 DIAGNOSIS — I10 Essential (primary) hypertension: Secondary | ICD-10-CM

## 2018-07-31 DIAGNOSIS — I350 Nonrheumatic aortic (valve) stenosis: Secondary | ICD-10-CM

## 2018-07-31 NOTE — Patient Instructions (Signed)
Medication Instructions:  Your physician recommends that you continue on your current medications as directed. Please refer to the Current Medication list given to you today.  If you need a refill on your cardiac medications before your next appointment, please call your pharmacy.   Lab work: NONE  If you have labs (blood work) drawn today and your tests are completely normal, you will receive your results only by: . MyChart Message (if you have MyChart) OR . A paper copy in the mail If you have any lab test that is abnormal or we need to change your treatment, we will call you to review the results.  Testing/Procedures: NONE   Follow-Up: At [facility], you and your health needs are our priority.  As part of our continuing mission to provide you with exceptional heart care, we have created designated Provider Care Teams.  These Care Teams include your primary Cardiologist (physician) and Advanced Practice Providers (APPs -  Physician Assistants and Nurse Practitioners) who all work together to provide you with the care you need, when you need it. You will need a follow up appointment in 1 years.  Please call our office 2 months in advance to schedule this appointment.  You may see Branch, Jonathan, MD or one of the following Advanced Practice Providers on your designated Care Team:   Brittany Strader, PA-C ([place] Office) . Michele Lenze, PA-C ([place] Office)  Any Other Special Instructions Will Be Listed Below (If Applicable). Thank you for choosing [facility]!     

## 2018-07-31 NOTE — Progress Notes (Signed)
Cardiology Office Note    Date:  07/31/2018   ID:  Anne Wolf, DOB Aug 26, 1933, MRN [id number]  PCP:  Asencion Noble, MD  Cardiologist: Carlyle Dolly, MD    Chief Complaint  Patient presents with  . Hospitalization Follow-up    History of Present Illness:    Anne Wolf is a 85 y.o. female with past medical history of paroxysmal atrial fibrillation, HTN, HLD, and aortic stenosis who presents to the office today for follow-up from a recent hospitalization.  She was last examined by Dr. Harl Bowie in 09/2017 and denied any recent chest pain, dyspnea on exertion, or palpitations at that time. She was interested in switching from Coumadin to a NOAC and was transitioned to Eliquis 5 mg twice daily. She had called the office in 04/2018 to arrange follow-up for a recent hospitalization but this was canceled due to COVID-19. At the time of a phone call during that timeframe, she reported having been hospitalized in [place] for an allergic reaction and had been in atrial fibrillation. Since returning home her heart rate and blood pressure had overall remained stable and she was continued on her current medication regimen.  In talking with the patient today, she reports overall doing well from a cardiac perspective since her last office visit. She was visiting her son when she had the allergic reaction in 04/2018 and says this was thought to be secondary to their pets as she has known allergies to dogs and cats. HR was well-controlled during admission but they had encouraged her to follow-up with Cardiology. In 05/2018, she was diagnosed with a kidney stone and was off Eliquis for 4 days at that time due to hematuria but has since resumed the medication.   She denies any recent chest pain, dyspnea on exertion, palpitations, orthopnea, PND, or edema. Says she is overall unaware of her arrhythmia unless she checks her pulse and notices it skipping. HR is well-controlled in the 70's to 80's when  checked at home.     Past Medical History:  Diagnosis Date  . Aortic stenosis    mild; mild MR; slightly increased pulmonary artery pressure with mild RVH; normal LV-2010  . Arthritis   . Atrial fibrillation (HCC)    chronic anticoagulation; adequate HR control on minimal AV nodal blocking medication   . Cancer ([place])    skin cancers removed from face  . Diabetes mellitus without complication ([place])    2  . Dysrhythmia    A FIB  . Fasting hyperglycemia    + microalbuminuria; A1c of 6.5% in 11/2010  . Hematochezia 01/2009   01/2009-presumed ischemic colitis; history of diverticulosis  . History of kidney stones   . Hx of adenomatous colonic polyps 2005   adenomatous polyp; negative colonoscopy in 2009  . Hyperlipidemia     Past Surgical History:  Procedure Laterality Date  . CATARACT EXTRACTION W/PHACO Right 03/23/2014   Procedure: CATARACT EXTRACTION PHACO AND INTRAOCULAR LENS PLACEMENT (IOC);  Surgeon: Williams Che, MD;  Location: [facility];  Service: Ophthalmology;  Laterality: Right;  CDE:2.83  . CATARACT EXTRACTION W/PHACO Left 07/27/2014   Procedure: CATARACT EXTRACTION PHACO AND INTRAOCULAR LENS PLACEMENT LEFT EYE CDE=6.97;  Surgeon: Williams Che, MD;  Location: [facility];  Service: Ophthalmology;  Laterality: Left;  . COLONOSCOPY  2009   negative; prior study with adenomatous polyp  . CYSTOSCOPY/URETEROSCOPY/HOLMIUM LASER/STENT PLACEMENT Right 08/21/2016   Procedure: CYSTOSCOPY/URETEROSCOPY/RETROGRADE PYELOGRAM//STENT PLACEMENT;  Surgeon: Nickie Retort, MD;  Location: [facility];  Service: Urology;  Laterality: Right;  . EYE SURGERY      Current Medications: Outpatient Medications Prior to Visit  Medication Sig Dispense Refill  . apixaban (ELIQUIS) 5 MG TABS tablet Take 1 tablet (5 mg total) by mouth 2 (two) times daily. 180 tablet 3  . calcium carbonate (OSCAL) 1500 (600 Ca) MG TABS tablet Take 600 mg of elemental calcium by mouth daily.    . cholecalciferol (VITAMIN  D) 1000 units tablet Take 1,000 Units by mouth daily.    Marland Kitchen Co-Enzyme Q-10 100 MG CAPS Take 1 capsule by mouth at bedtime.     . diclofenac sodium (VOLTAREN) 1 % GEL Apply 2 g topically daily as needed (pain).    Marland Kitchen lisinopril (PRINIVIL,ZESTRIL) 10 MG tablet Take 10 mg by mouth every evening.     . loratadine (CLARITIN) 10 MG tablet Take 10 mg by mouth daily.    . metFORMIN (GLUCOPHAGE) 500 MG tablet Take 500 mg by mouth 2 (two) times daily with a meal.     . metoprolol succinate (TOPROL-XL) 25 MG 24 hr tablet Take 12.5 mg by mouth 2 (two) times daily.     . Multiple Vitamins-Minerals (CENTRUM SILVER) tablet Take 1 tablet by mouth at bedtime.     . simvastatin (ZOCOR) 40 MG tablet Take 40 mg by mouth at bedtime.      Marland Kitchen warfarin (COUMADIN) 2 MG tablet TAKE 1 TABLET BY MOUTH DAILY EXCEPT 1/2 TABLET ON TUESDAYS 30 tablet 3   No facility-administered medications prior to visit.      Allergies:   Patient has no known allergies.   Social History   Socioeconomic History  . Marital status: Married    Spouse name: Not on file  . Number of children:  2  . Years of education: Not on file  . Highest education level: Not on file  Occupational History  . Occupation: Retired  Scientific laboratory technician  . Financial resource strain: Not on file  . Food insecurity:    Worry: Not on file    Inability: Not on file  . Transportation needs:    Medical: Not on file    Non-medical: Not on file  Tobacco Use  . Smoking status: Never Smoker  . Smokeless tobacco: Never Used  Substance and Sexual Activity  . Alcohol use: No  . Drug use: No  . Sexual activity: Not Currently  Lifestyle  . Physical activity:    Days per week: Not on file    Minutes per session: Not on file  . Stress: Not on file  Relationships  . Social connections:    Talks on phone: Not on file    Gets together: Not on file    Attends religious service: Not on file    Active member of club or organization: Not on file    Attends meetings of  clubs or organizations: Not on file    Relationship status: Not on file  Other Topics Concern  . Not on file  Social History Narrative   Married with 2 children   No regular exercise     Family History:  The patient's family history includes Arrhythmia in her sister; Cancer in her father.   Review of Systems:   Please see the history of present illness.     General:  No chills, fever, night sweats or weight changes.  Cardiovascular:  No chest pain, dyspnea on exertion, edema, orthopnea, palpitations, paroxysmal nocturnal dyspnea. Dermatological: No rash, lesions/masses Respiratory: No cough, dyspnea  Urologic: No dysuria. Positive for hematuria (now resolved).  Abdominal:   No nausea, vomiting, diarrhea, bright red blood per rectum, melena, or hematemesis Neurologic:  No visual changes, wkns, changes in mental status.   All other systems reviewed and are otherwise negative except as noted above.   Physical Exam:    VS:  BP 126/76   Pulse 93   Temp (!) 96.7 F (35.9 C)   Wt 177 lb (80.3 kg)   SpO2 98%   BMI 33.44 kg/m    General: Well developed, well nourished Caucasian female appearing in no acute distress. Head: Normocephalic, atraumatic, sclera non-icteric, no xanthomas, nares are without discharge.  Neck: No carotid bruits. JVD not elevated.  Lungs: Respirations regular and unlabored, without wheezes or rales.  Heart: Irregularly irregular. No S3 or S4.  No rubs or gallops appreciated. 2/6 SEM along RUSB.  Abdomen: Soft, non-tender, non-distended with normoactive bowel sounds. No hepatomegaly. No rebound/guarding. No obvious abdominal masses. Msk:  Strength and tone appear normal for age. No joint deformities or effusions. Extremities: No clubbing or cyanosis. No edema.  Distal pedal pulses are 2+ bilaterally. Neuro: Alert and oriented X 3. Moves all extremities spontaneously. No focal deficits noted. Psych:  Responds to questions appropriately with a normal affect.  Skin: No rashes or lesions noted  Wt Readings from Last 3 Encounters:  07/31/18 177 lb (80.3 kg)  10/16/17 176 lb (79.8 kg)  06/13/17 174 lb (78.9 kg)     Studies/Labs Reviewed:   EKG:  EKG is not ordered today.    Recent Labs: 11/28/2017: BUN 13; Creatinine, Ser 0.90; Hemoglobin 13.2; Platelets 222; Potassium 4.5; Sodium 139   Lipid Panel    Component Value Date/Time   CHOL 182 03/24/2008   TRIG 127 03/24/2008   HDL 49 03/24/2008   LDLCALC 108 03/24/2008   LDLCALC 108 03/24/2008    Additional studies/ records that were reviewed today include:   Echocardiogram: 08/2016 Study Conclusions  - Left ventricle: The cavity size was normal. Wall thickness was   increased in a pattern of mild LVH. Systolic function was normal.   The estimated ejection fraction was in the range of 55% to 60%. - Aortic valve: AV is thickened, calcified with mildly restricted   motion Peak and mean gradients through the valve are 30 and 17 mm   Hg respectively. There was mild regurgitation. - Mitral valve: Calcified annulus. Mildly thickened leaflets .   There was mild regurgitation. - Left atrium: The atrium was mildly dilated. - Right atrium: The atrium was mildly dilated. - Pulmonary arteries: PA peak pressure: 49 mm Hg (S).  Assessment:    1. Persistent atrial fibrillation   2. Essential hypertension   3. Mixed hyperlipidemia   4. Nonrheumatic aortic valve stenosis      Plan:   In order of problems listed above:  1. Persistent Atrial Fibrillation - she denies any recent palpitations or new dyspnea. HR has been well-controlled in the 70's to 80's when checked at home. Continue Toprol-XL for rate-control. - was previously having hematuria in 05/2018 in the setting of a kidney stone but denies any recurrent symptoms. Continue Eliquis 5mg  BID for anticoagulation (only indication for reduced dosing at this time is age).   2. HTN - BP is well-controlled at 126/76 during today's visit.   - continue current regimen with Lisinopril 10mg  daily and Toprol-XL 12.5mg  BID.   3. HLD - followed by PCP. She remains on Simvastatin 40mg  daily.   4. Aortic  Stenosis - mild to moderate by echocardiogram in 2018. No recent dyspnea, dizziness, or presyncope. Would anticipate repeat echocardiogram next year unless clinically indicated sooner.   Medication Adjustments/Labs and Tests Ordered: Current medicines are reviewed at length with the patient today.  Concerns regarding medicines are outlined above.  Medication changes, Labs and Tests ordered today are listed in the Patient Instructions below. Patient Instructions  Medication Instructions:  Your physician recommends that you continue on your current medications as directed. Please refer to the Current Medication list given to you today.  If you need a refill on your cardiac medications before your next appointment, please call your pharmacy.   Lab work: NONE  If you have labs (blood work) drawn today and your tests are completely normal, you will receive your results only by: Marland Kitchen MyChart Message (if you have MyChart) OR . A paper copy in the mail If you have any lab test that is abnormal or we need to change your treatment, we will call you to review the results.  Testing/Procedures: NONE   Follow-Up: At [facility], you and your health needs are our priority.  As part of our continuing mission to provide you with exceptional heart care, we have created designated Provider Care Teams.  These Care Teams include your primary Cardiologist (physician) and Advanced Practice Providers (APPs -  Physician Assistants and Nurse Practitioners) who all work together to provide you with the care you need, when you need it. You will need a follow up appointment in 1 years.  Please call our office 2 months in advance to schedule this appointment.  You may see Carlyle Dolly, MD or one of the following Advanced Practice Providers on your  designated Care Team:   Bernerd Pho, PA-C [facility]) . Ermalinda Barrios, PA-C ([place])  Any Other Special Instructions Will Be Listed Below (If Applicable). Thank you for choosing [facility]!        Signed, Erma Heritage, PA-C  07/31/2018 7:11 PM    [facility] [address] [place], [place] [address] Phone: [phone] Fax: [phone]

## 2018-09-09 DIAGNOSIS — E1129 Type 2 diabetes mellitus with other diabetic kidney complication: Secondary | ICD-10-CM | POA: Diagnosis not present

## 2018-09-09 DIAGNOSIS — Z79899 Other long term (current) drug therapy: Secondary | ICD-10-CM | POA: Diagnosis not present

## 2018-09-16 DIAGNOSIS — Z961 Presence of intraocular lens: Secondary | ICD-10-CM | POA: Diagnosis not present

## 2018-09-16 DIAGNOSIS — E119 Type 2 diabetes mellitus without complications: Secondary | ICD-10-CM | POA: Diagnosis not present

## 2018-09-16 DIAGNOSIS — H04123 Dry eye syndrome of bilateral lacrimal glands: Secondary | ICD-10-CM | POA: Diagnosis not present

## 2018-09-16 DIAGNOSIS — E1129 Type 2 diabetes mellitus with other diabetic kidney complication: Secondary | ICD-10-CM | POA: Diagnosis not present

## 2018-09-16 DIAGNOSIS — I482 Chronic atrial fibrillation, unspecified: Secondary | ICD-10-CM | POA: Diagnosis not present

## 2018-10-22 ENCOUNTER — Other Ambulatory Visit: Payer: Self-pay

## 2018-10-22 MED ORDER — APIXABAN 5 MG PO TABS: 5.0000 mg | ORAL_TABLET | Freq: Two times a day (BID) | ORAL | 3 refills | Status: DC

## 2019-01-02 DIAGNOSIS — D225 Melanocytic nevi of trunk: Secondary | ICD-10-CM | POA: Diagnosis not present

## 2019-01-02 DIAGNOSIS — Z1283 Encounter for screening for malignant neoplasm of skin: Secondary | ICD-10-CM | POA: Diagnosis not present

## 2019-01-02 DIAGNOSIS — L258 Unspecified contact dermatitis due to other agents: Secondary | ICD-10-CM | POA: Diagnosis not present

## 2019-01-07 DIAGNOSIS — E1129 Type 2 diabetes mellitus with other diabetic kidney complication: Secondary | ICD-10-CM | POA: Diagnosis not present

## 2019-01-07 DIAGNOSIS — E875 Hyperkalemia: Secondary | ICD-10-CM | POA: Diagnosis not present

## 2019-01-07 DIAGNOSIS — Z79899 Other long term (current) drug therapy: Secondary | ICD-10-CM | POA: Diagnosis not present

## 2019-01-07 DIAGNOSIS — M810 Age-related osteoporosis without current pathological fracture: Secondary | ICD-10-CM | POA: Diagnosis not present

## 2019-01-07 DIAGNOSIS — E785 Hyperlipidemia, unspecified: Secondary | ICD-10-CM | POA: Diagnosis not present

## 2019-01-23 ENCOUNTER — Other Ambulatory Visit (HOSPITAL_COMMUNITY): Payer: Self-pay | Admitting: Internal Medicine

## 2019-01-23 DIAGNOSIS — E1129 Type 2 diabetes mellitus with other diabetic kidney complication: Secondary | ICD-10-CM | POA: Diagnosis not present

## 2019-01-23 DIAGNOSIS — Z780 Asymptomatic menopausal state: Secondary | ICD-10-CM

## 2019-01-23 DIAGNOSIS — I482 Chronic atrial fibrillation, unspecified: Secondary | ICD-10-CM | POA: Diagnosis not present

## 2019-01-23 DIAGNOSIS — E785 Hyperlipidemia, unspecified: Secondary | ICD-10-CM | POA: Diagnosis not present

## 2019-02-03 ENCOUNTER — Ambulatory Visit (HOSPITAL_COMMUNITY)
Admission: RE | Admit: 2019-02-03 | Discharge: 2019-02-03 | Disposition: A | Discharge status: Home / Self Care | Payer: Medicare Other | Source: Ambulatory Visit | Attending: Internal Medicine | Admitting: Internal Medicine

## 2019-02-03 ENCOUNTER — Other Ambulatory Visit: Payer: Self-pay

## 2019-02-03 DIAGNOSIS — M85851 Other specified disorders of bone density and structure, right thigh: Secondary | ICD-10-CM | POA: Diagnosis not present

## 2019-02-03 DIAGNOSIS — Z780 Asymptomatic menopausal state: Secondary | ICD-10-CM

## 2019-02-03 DIAGNOSIS — M810 Age-related osteoporosis without current pathological fracture: Secondary | ICD-10-CM | POA: Diagnosis not present

## 2019-03-18 DIAGNOSIS — N200 Calculus of kidney: Secondary | ICD-10-CM | POA: Diagnosis not present

## 2019-04-29 ENCOUNTER — Other Ambulatory Visit: Payer: Self-pay | Admitting: *Deleted

## 2019-04-29 MED ORDER — APIXABAN 5 MG PO TABS: 5.0000 mg | ORAL_TABLET | Freq: Two times a day (BID) | ORAL | 3 refills | Status: DC

## 2019-05-07 ENCOUNTER — Other Ambulatory Visit: Payer: Self-pay

## 2019-05-07 MED ORDER — APIXABAN 5 MG PO TABS: 5.0000 mg | ORAL_TABLET | Freq: Two times a day (BID) | ORAL | 1 refills | Status: DC

## 2019-05-07 NOTE — Telephone Encounter (Signed)
Refilled eliquis 5 mg #180 RF:1

## 2019-05-16 DIAGNOSIS — E1129 Type 2 diabetes mellitus with other diabetic kidney complication: Secondary | ICD-10-CM | POA: Diagnosis not present

## 2019-05-16 DIAGNOSIS — E875 Hyperkalemia: Secondary | ICD-10-CM | POA: Diagnosis not present

## 2019-05-26 DIAGNOSIS — E1122 Type 2 diabetes mellitus with diabetic chronic kidney disease: Secondary | ICD-10-CM | POA: Diagnosis not present

## 2019-05-26 DIAGNOSIS — R7309 Other abnormal glucose: Secondary | ICD-10-CM | POA: Diagnosis not present

## 2019-05-26 DIAGNOSIS — E875 Hyperkalemia: Secondary | ICD-10-CM | POA: Diagnosis not present

## 2019-05-26 DIAGNOSIS — M810 Age-related osteoporosis without current pathological fracture: Secondary | ICD-10-CM | POA: Diagnosis not present

## 2019-07-15 DIAGNOSIS — D485 Neoplasm of uncertain behavior of skin: Secondary | ICD-10-CM | POA: Diagnosis not present

## 2019-07-15 DIAGNOSIS — L570 Actinic keratosis: Secondary | ICD-10-CM | POA: Diagnosis not present

## 2019-07-15 DIAGNOSIS — Z85828 Personal history of other malignant neoplasm of skin: Secondary | ICD-10-CM | POA: Diagnosis not present

## 2019-07-15 DIAGNOSIS — L814 Other melanin hyperpigmentation: Secondary | ICD-10-CM | POA: Diagnosis not present

## 2019-07-15 DIAGNOSIS — L821 Other seborrheic keratosis: Secondary | ICD-10-CM | POA: Diagnosis not present

## 2019-07-17 ENCOUNTER — Other Ambulatory Visit: Payer: Self-pay

## 2019-07-17 ENCOUNTER — Ambulatory Visit: Payer: Medicare HMO | Attending: Internal Medicine

## 2019-07-17 DIAGNOSIS — Z20822 Contact with and (suspected) exposure to covid-19: Secondary | ICD-10-CM | POA: Insufficient documentation

## 2019-07-18 LAB — NOVEL CORONAVIRUS, NAA: SARS-CoV-2, NAA: NOT DETECTED

## 2019-07-18 LAB — SARS-COV-2, NAA 2 DAY TAT

## 2019-08-01 ENCOUNTER — Telehealth: Payer: Self-pay | Admitting: General Practice

## 2019-08-01 NOTE — Telephone Encounter (Signed)
Negative COVID results given. Patient results "NOT Detected." Caller expressed understanding. ° °

## 2019-08-29 ENCOUNTER — Telehealth: Payer: Self-pay | Admitting: Cardiology

## 2019-08-29 NOTE — Telephone Encounter (Signed)
Pt states that at the end of June she had a nose bleed for four days and was seen by PCP and Eliquis stopped for 2 days. Pt states that on yesterday and today that her nose started to bleed again. She recalls having the same problem 2 years ago and was educated on how to stop the them. Please advise.

## 2019-08-29 NOTE — Telephone Encounter (Signed)
Pt notified and voiced understanding 

## 2019-08-29 NOTE — Telephone Encounter (Signed)
Hold eliquis over the weekend and update [place] on Monday. If ongoing issues with nose bleeds likely will need to refer to ENT  Zandra Abts MD

## 2019-08-29 NOTE — Telephone Encounter (Signed)
Pt states she is having frequent nose bleeds    Please call [phone]   Thanks renee

## 2019-09-01 ENCOUNTER — Telehealth: Payer: Self-pay | Admitting: Cardiology

## 2019-09-01 ENCOUNTER — Telehealth: Payer: Self-pay | Admitting: Physician Assistant

## 2019-09-01 DIAGNOSIS — R040 Epistaxis: Secondary | ICD-10-CM

## 2019-09-01 NOTE — Telephone Encounter (Signed)
If recurrent nose bleeds even off eliquis please refer to ENT for evaluation. Continue to hold eliquis and update [place] again on Wednesday   J Adrianna Dudas MD

## 2019-09-01 NOTE — Telephone Encounter (Signed)
Has been holding since 1/2 day on Friday, Saturday, Sunday & today.  Not currently having any allergy issues or using any nose sprays currently.  Had 2 nosebleeds this morning spontaneously.  Will stop with pinching nose after about 10 minutes.  Only bleeding in left nostril this time.

## 2019-09-01 NOTE — Telephone Encounter (Signed)
Fwd to provider for advice.  

## 2019-09-01 NOTE — Telephone Encounter (Signed)
Patient called to clarify Eliquis recommendations. Reviewed prior coversation. Advised to continue to hold Eliquis and call us back in 2 days as recommended by Dr. Harl Bowie.

## 2019-09-01 NOTE — Telephone Encounter (Signed)
Left message to return call 

## 2019-09-01 NOTE — Telephone Encounter (Signed)
Thx for update. Please refer to ENT, needs evaluation. Hold elquis for now, update [place] again mid to late week   Zandra Abts MD

## 2019-09-01 NOTE — Telephone Encounter (Signed)
New Message    Patient called Friday about having nose bleed and was told to stop her Eliquis on Friday, she was fine Saturday, but then on Sunday she had another one. Please advise on how to proceed .

## 2019-09-02 MED ORDER — APIXABAN 5 MG PO TABS: 5.0000 mg | ORAL_TABLET | Freq: Two times a day (BID) | ORAL | 1 refills | Status: DC

## 2019-09-02 NOTE — Telephone Encounter (Signed)
Ref place to ENT, Dr.Teoh   Spoke with patient, will continue to HOLD Eliquis and callus back later this week.

## 2019-09-05 ENCOUNTER — Telehealth: Payer: Self-pay | Admitting: Cardiology

## 2019-09-05 NOTE — Telephone Encounter (Signed)
You may also use  [phone]    Thanks renee

## 2019-09-05 NOTE — Telephone Encounter (Signed)
Per Pt since medication changes she is still having nose bleeds   Please call [phone]   Thanks renee

## 2019-09-05 NOTE — Telephone Encounter (Signed)
Thanks for update, other than continuing to hold the eliquis and referring her to ENT we do not have anything additional to offer for ongoing nosebleeds, if ongoing problem and her ENT appt is some time out would need to contact pcp. I would say continue to hold her eliquis, if she has a period of 2 days without any nosebleeds would try restarting   Zandra Abts MD

## 2019-09-05 NOTE — Telephone Encounter (Signed)
lmtcb with young girl on pts home number, pt's call number has no VM

## 2019-09-07 NOTE — Telephone Encounter (Signed)
Patient called the after hour answering service informing us that she has not had nosebleed in a few days, therefore she restarted Eliquis yesterday. She still has not heard back from ENT Dr. Deeann Saint office. I did give her Dr. Deeann Saint office phone number and informed her the referral was placed on 7/13.

## 2019-09-08 DIAGNOSIS — H903 Sensorineural hearing loss, bilateral: Secondary | ICD-10-CM | POA: Diagnosis not present

## 2019-09-08 DIAGNOSIS — Z01118 Encounter for examination of ears and hearing with other abnormal findings: Secondary | ICD-10-CM | POA: Diagnosis not present

## 2019-09-15 DIAGNOSIS — H01136 Eczematous dermatitis of left eye, unspecified eyelid: Secondary | ICD-10-CM | POA: Diagnosis not present

## 2019-09-15 DIAGNOSIS — H04123 Dry eye syndrome of bilateral lacrimal glands: Secondary | ICD-10-CM | POA: Diagnosis not present

## 2019-09-15 DIAGNOSIS — H1045 Other chronic allergic conjunctivitis: Secondary | ICD-10-CM | POA: Diagnosis not present

## 2019-09-15 DIAGNOSIS — H01133 Eczematous dermatitis of right eye, unspecified eyelid: Secondary | ICD-10-CM | POA: Diagnosis not present

## 2019-09-15 DIAGNOSIS — Z961 Presence of intraocular lens: Secondary | ICD-10-CM | POA: Diagnosis not present

## 2019-09-15 DIAGNOSIS — E119 Type 2 diabetes mellitus without complications: Secondary | ICD-10-CM | POA: Diagnosis not present

## 2019-09-18 DIAGNOSIS — E1129 Type 2 diabetes mellitus with other diabetic kidney complication: Secondary | ICD-10-CM | POA: Diagnosis not present

## 2019-09-25 DIAGNOSIS — E1129 Type 2 diabetes mellitus with other diabetic kidney complication: Secondary | ICD-10-CM | POA: Diagnosis not present

## 2019-09-25 DIAGNOSIS — I480 Paroxysmal atrial fibrillation: Secondary | ICD-10-CM | POA: Diagnosis not present

## 2019-10-03 DIAGNOSIS — R040 Epistaxis: Secondary | ICD-10-CM | POA: Diagnosis not present

## 2019-10-06 ENCOUNTER — Telehealth: Payer: Self-pay | Admitting: Cardiology

## 2019-10-06 NOTE — Telephone Encounter (Signed)
I will FYI Dr.Branch and Edrick Oh RN

## 2019-10-06 NOTE — Telephone Encounter (Signed)
Can we get the notes from Dr Deeann Saint office please   Zandra Abts MD

## 2019-10-06 NOTE — Telephone Encounter (Signed)
I have faxed request to Dr. Deeann Saint office.

## 2019-10-06 NOTE — Telephone Encounter (Signed)
New message    Pt c/o medication issue:  1. Name of Medication: eliquis   2. How are you currently taking this medication (dosage and times per day)? As prescribed   3. Are you having a reaction (difficulty breathing--STAT)? Nose bleeds   4. What is your medication issue? Patient went to ENT Friday Dr Benjamine Mola he wants her to stop the Eliquis because of the frequent nose bleeds and start Murphy.

## 2019-10-06 NOTE — Telephone Encounter (Signed)
I will hold off until I hear back from you.

## 2019-10-07 ENCOUNTER — Telehealth: Payer: Self-pay | Admitting: Cardiology

## 2019-10-07 DIAGNOSIS — I421 Obstructive hypertrophic cardiomyopathy: Secondary | ICD-10-CM

## 2019-10-07 NOTE — Telephone Encounter (Signed)
Patient does not want to take eliquis anymore.She is leaving for [place] tomorrow at 4 am and wants to know when she should start warfarin.Pt states she has warfarin at home    Dr.Teoh office cannot release his note as he has not signed it yet.   Patient wants call from coumadin clinic at [phone]

## 2019-10-07 NOTE — Telephone Encounter (Signed)
New message    Pt c/o medication issue:  1. Name of Medication: eliquis   2. How are you currently taking this medication (dosage and times per day)? She is only gonna take 1 tablet a day because taking 2 tablets a day is causing her nose bleeds   3. Are you having a reaction (difficulty breathing--STAT)? yes  4. What is your medication issue? It is causing her to have nose bleeds, she is going on vacation tomorrow and has not heard back from Dr Harl Bowie, she states Dr Benjamine Mola told her to stop the Eliquis and go back on Warafrin and this is what she wants to do, or she is [place] consult with Dr Beverlee Nims office about ti.

## 2019-10-07 NOTE — Telephone Encounter (Signed)
I spoke with Dr.Teoh's office.Dr.Teoh has not signed his office note yet. As soon as he does, they will fax the note to [place].

## 2019-10-08 NOTE — Telephone Encounter (Signed)
Dr Harl Bowie Do you want me to go ahead and switch her back to warfarin?

## 2019-10-08 NOTE — Telephone Encounter (Signed)
Not a [facility] patient

## 2019-10-09 ENCOUNTER — Other Ambulatory Visit: Payer: Self-pay

## 2019-10-09 NOTE — Telephone Encounter (Signed)
She said she would be gone 1 week

## 2019-10-09 NOTE — Telephone Encounter (Signed)
C[place], Do  You have any idea when she will be back?

## 2019-10-09 NOTE — Telephone Encounter (Signed)
I don't think coumadin will have much difference compared to eliquis but reasonable to try. Can set her up in coumadin clinic appt when she gets back in town   Zandra Abts MD

## 2019-10-13 ENCOUNTER — Other Ambulatory Visit: Payer: Self-pay | Admitting: *Deleted

## 2019-10-13 MED ORDER — WARFARIN SODIUM 2 MG PO TABS: ORAL_TABLET | ORAL | 4 refills | Status: DC

## 2019-10-13 NOTE — Addendum Note (Signed)
Addended by: Malen Gauze on: 10/13/2019 10:32 AM   Modules accepted: Orders

## 2019-10-13 NOTE — Telephone Encounter (Signed)
Pt called back from [place].  Wants to change from Eliquis back to warfarin due to recurrent nose bleeds.  Dr Benjamine Mola told her the Eliquis is too strong for her.  Pt will overlap Eliquis and warfarin x 3 days (8/30, 8/31, 9/1)  She will take warfarin 2mg  the first 3 days then start 2mg  daily except 1mg  on Mondays, Wednesdays and Fridays. (Previous therapeutic dose).  She will come for INR check in 1 week.  Warfarin Rx sent to [facility] per pt request.  Pt verbalized understanding of above instructions.

## 2019-10-28 ENCOUNTER — Ambulatory Visit (INDEPENDENT_AMBULATORY_CARE_PROVIDER_SITE_OTHER): Payer: Medicare HMO | Admitting: Pharmacist

## 2019-10-28 DIAGNOSIS — I4819 Other persistent atrial fibrillation: Secondary | ICD-10-CM | POA: Diagnosis not present

## 2019-10-28 DIAGNOSIS — I824Y9 Acute embolism and thrombosis of unspecified deep veins of unspecified proximal lower extremity: Secondary | ICD-10-CM | POA: Insufficient documentation

## 2019-10-28 LAB — POCT INR: INR: 2.1 (ref 2.0–3.0)

## 2019-10-28 NOTE — Patient Instructions (Signed)
Description   Take Coumadin 1 tablet daily except 1/2 tablets on Mondays, Wednesdays and Fridays.  Recheck INR in 2 weeks.

## 2019-11-13 ENCOUNTER — Ambulatory Visit (INDEPENDENT_AMBULATORY_CARE_PROVIDER_SITE_OTHER): Payer: Medicare HMO | Admitting: *Deleted

## 2019-11-13 DIAGNOSIS — Z5181 Encounter for therapeutic drug level monitoring: Secondary | ICD-10-CM | POA: Diagnosis not present

## 2019-11-13 DIAGNOSIS — I4819 Other persistent atrial fibrillation: Secondary | ICD-10-CM

## 2019-11-13 DIAGNOSIS — R040 Epistaxis: Secondary | ICD-10-CM | POA: Diagnosis not present

## 2019-11-13 LAB — POCT INR: INR: 1.8 — AB (ref 2.0–3.0)

## 2019-11-13 NOTE — Patient Instructions (Signed)
Continue warfarin 1 tablet daily except 1/2 tablets on Mondays, Wednesdays and Fridays. Recheck INR in 2 weeks.

## 2019-11-26 ENCOUNTER — Ambulatory Visit (INDEPENDENT_AMBULATORY_CARE_PROVIDER_SITE_OTHER): Payer: Medicare HMO | Admitting: *Deleted

## 2019-11-26 DIAGNOSIS — Z5181 Encounter for therapeutic drug level monitoring: Secondary | ICD-10-CM | POA: Diagnosis not present

## 2019-11-26 DIAGNOSIS — I4819 Other persistent atrial fibrillation: Secondary | ICD-10-CM | POA: Diagnosis not present

## 2019-11-26 LAB — POCT INR: INR: 1.4 — AB (ref 2.0–3.0)

## 2019-11-26 NOTE — Patient Instructions (Signed)
Take warfarin 1 1/2 tablets tonight and tomorrow night then increase dose to 1 tablet daily except 1/2 tablet on Wednesdays and Saturdays Recheck INR in 2 weeks.

## 2019-12-09 ENCOUNTER — Ambulatory Visit (INDEPENDENT_AMBULATORY_CARE_PROVIDER_SITE_OTHER): Payer: Medicare HMO | Admitting: *Deleted

## 2019-12-09 DIAGNOSIS — Z5181 Encounter for therapeutic drug level monitoring: Secondary | ICD-10-CM

## 2019-12-09 DIAGNOSIS — I4819 Other persistent atrial fibrillation: Secondary | ICD-10-CM

## 2019-12-09 LAB — POCT INR: INR: 2.4 (ref 2.0–3.0)

## 2019-12-09 NOTE — Patient Instructions (Signed)
Continue warfarin 1 tablet daily except 1/2 tablet on Wednesdays and Saturdays Recheck INR in 4 weeks.

## 2019-12-21 NOTE — Progress Notes (Addendum)
Cardiology Office Note  Date: 12/22/2019   ID: Anne Wolf, DOB [birth year], MRN [id number]  PCP:  Asencion Noble, MD  Cardiologist:  Carlyle Dolly, MD Electrophysiologist:  None   Chief Complaint: Persistent atrial fibrillation  History of Present Illness: Anne Wolf is a 86 y.o. female with a history of persistent atrial fibrillation, HTN, HLD, aortic stenosis.  Last saw Bernerd Pho, [place] 07/31/2018.  At that time she denied any chest pain, DOE, palpitations, orthopnea, PND, edema.  Heart rate was well controlled in the 70s and 80s.  She was continuing Toprol-XL.  She was continuing Eliquis p.o. twice daily.  Blood pressure was well controlled.  She was continue lisinopril 10 mg daily and Toprol-XL 12.5 mg p.o. twice daily.  Lipids were followed by PCP she was continuing simvastatin 40 mg daily.  Aortic stenosis was mild to moderate by echo 2018.  No recent symptoms.  Anticipating repeat echo the following year.   Patient is here for 1 year follow-up.  She denies any recent acute illnesses or hospitalizations.  States she cannot tell if she is in atrial fibrillation.  She never feels any palpitations.  Denies any anginal or exertional symptoms, orthostatic symptoms, CVA or TIA-like symptoms, bleeding issues, PND, orthopnea, EKG today shows atrial fibrillation woman with LAFB rate of 83.  Past Medical History:  Diagnosis Date  . Aortic stenosis    mild; mild MR; slightly increased pulmonary artery pressure with mild RVH; normal LV-2010  . Arthritis   . Atrial fibrillation (HCC)    chronic anticoagulation; adequate HR control on minimal AV nodal blocking medication   . Cancer ([place])    skin cancers removed from face  . Diabetes mellitus without complication ([place])    2  . Dysrhythmia    A FIB  . Fasting hyperglycemia    + microalbuminuria; A1c of 6.5% in 11/2010  . Hematochezia 01/2009   01/2009-presumed ischemic colitis; history of diverticulosis  . History of kidney stones    . Hx of adenomatous colonic polyps 2005   adenomatous polyp; negative colonoscopy in 2009  . Hyperlipidemia     Past Surgical History:  Procedure Laterality Date  . CATARACT EXTRACTION W/PHACO Right 03/23/2014   Procedure: CATARACT EXTRACTION PHACO AND INTRAOCULAR LENS PLACEMENT (IOC);  Surgeon: Williams Che, MD;  Location: [facility];  Service: Ophthalmology;  Laterality: Right;  CDE:2.83  . CATARACT EXTRACTION W/PHACO Left 07/27/2014   Procedure: CATARACT EXTRACTION PHACO AND INTRAOCULAR LENS PLACEMENT LEFT EYE CDE=6.97;  Surgeon: Williams Che, MD;  Location: [facility];  Service: Ophthalmology;  Laterality: Left;  . COLONOSCOPY  2009   negative; prior study with adenomatous polyp  . CYSTOSCOPY/URETEROSCOPY/HOLMIUM LASER/STENT PLACEMENT Right 08/21/2016   Procedure: CYSTOSCOPY/URETEROSCOPY/RETROGRADE PYELOGRAM//STENT PLACEMENT;  Surgeon: Nickie Retort, MD;  Location: [facility];  Service: Urology;  Laterality: Right;  . EYE SURGERY      Current Outpatient Medications  Medication Sig Dispense Refill  . alendronate (FOSAMAX) 70 MG tablet Take 70 mg by mouth once a week.    . calcium carbonate (OSCAL) 1500 (600 Ca) MG TABS tablet Take 600 mg of elemental calcium by mouth daily.    . cholecalciferol (VITAMIN D) 1000 units tablet Take 1,000 Units by mouth daily.    Marland Kitchen Co-Enzyme Q-10 100 MG CAPS Take 1 capsule by mouth at bedtime.     Marland Kitchen lisinopril (PRINIVIL,ZESTRIL) 10 MG tablet Take 10 mg by mouth every evening.     . loratadine (CLARITIN) 10 MG tablet  Take 10 mg by mouth as needed.     . metFORMIN (GLUCOPHAGE) 500 MG tablet Take 500 mg by mouth 2 (two) times daily with a meal.     . metoprolol succinate (TOPROL-XL) 25 MG 24 hr tablet Take 25 mg by mouth daily.     . Multiple Vitamins-Minerals (CENTRUM SILVER) tablet Take 1 tablet by mouth at bedtime.     . simvastatin (ZOCOR) 40 MG tablet Take 40 mg by mouth at bedtime.      Marland Kitchen warfarin (COUMADIN) 2 MG tablet Take 1 tablet daily except 1/2  tablet on Mondays, Wednesdays and Fridays or as directed 90 tablet 4   No current facility-administered medications for this visit.   Allergies:  Patient has no known allergies.   Social History: The patient  reports that she has never smoked. She has never used smokeless tobacco. She reports that she does not drink alcohol and does not use drugs.   Family History: The patient's family history includes Arrhythmia in her sister; Cancer in her father.   ROS:  Please see the history of present illness. Otherwise, complete review of systems is positive for none.  All other systems are reviewed and negative.   Physical Exam: VS:  BP 120/68   Pulse 75   Ht 5\' 2"  (1.575 m)   Wt 166 lb 3.2 oz (75.4 kg)   SpO2 97%   BMI 30.40 kg/m , BMI Body mass index is 30.4 kg/m.  Wt Readings from Last 3 Encounters:  12/22/19 166 lb 3.2 oz (75.4 kg)  07/31/18 177 lb (80.3 kg)  10/16/17 176 lb (79.8 kg)    General: Patient appears comfortable at rest. Neck: Supple, no elevated JVP or carotid bruits, no thyromegaly. Lungs: Clear to auscultation, nonlabored breathing at rest. Cardiac: Irregularly irregular rate and rhythm, no S3 , 2/6 systolic murmur best heard at right upper sternal border.  No pericardial rub. Extremities: No pitting edema, distal pulses 2+. Skin: Warm and dry. Musculoskeletal: No kyphosis. Neuropsychiatric: Alert and oriented x3, affect grossly appropriate.  ECG:  An ECG dated 12/22/2019 was personally reviewed today and demonstrated:  Atrial fibrillation with left anterior fascicular block rate of 83.  Recent Labwork: No results found for requested labs within last 8760 hours.     Component Value Date/Time   CHOL 182 03/24/2008 0000   TRIG 127 03/24/2008 0000   HDL 49 03/24/2008 0000   LDLCALC 108 03/24/2008 0000   [place] 108 03/24/2008 0000    Other Studies Reviewed Today:  Echocardiogram: 08/2016 Study Conclusions  - Left ventricle: The cavity size was normal.  Wall thickness was increased in a pattern of mild LVH. Systolic function was normal. The estimated ejection fraction was in the range of 55% to 60%. - Aortic valve: AV is thickened, calcified with mildly restricted motion Peak and mean gradients through the valve are 30 and 17 mm Hg respectively. There was mild regurgitation. - Mitral valve: Calcified annulus. Mildly thickened leaflets . There was mild regurgitation. - Left atrium: The atrium was mildly dilated. - Right atrium: The atrium was mildly dilated. - Pulmonary arteries: PA peak pressure: 49 mm Hg (S).   Assessment and Plan:  1. Persistent atrial fibrillation ([place])   2. Essential hypertension   3. Mixed hyperlipidemia   4. Aortic valve stenosis, etiology of cardiac valve disease unspecified    1. Persistent atrial fibrillation (HCC) Continues in atrial fibrillation on EKG with the rate of 83 with LAFB.  States she cannot tell  she is in atrial fibrillation unless she checks her pulse.  Continue Toprol-XL 25 mg daily.  Continue Coumadin as directed.  2. Essential hypertension Blood pressure well controlled on current therapy.  Continue lisinopril 10 mg daily, continue Toprol-XL 25 mg daily.  3. Mixed hyperlipidemia Continue simvastatin 40 mg daily.  Patient states she has had no recent lab work at PCP office  4. Aortic valve stenosis, etiology of cardiac valve disease unspecified Repeat echo cardiogram to reassess aortic valve stenosis.  Patient denies any shortness of breath, near syncopal or syncopal episodes or anginal symptoms.  Medication Adjustments/Labs and Tests Ordered: Current medicines are reviewed at length with the patient today.  Concerns regarding medicines are outlined above.   Disposition: Follow-up with Dr. Harl Bowie or APP 1 year  Signed, Levell July, NP 12/22/2019 9:47 AM    [facility] [place], [place], [place] [address] Phone: [phone]; Fax: [phone]

## 2019-12-22 ENCOUNTER — Ambulatory Visit (INDEPENDENT_AMBULATORY_CARE_PROVIDER_SITE_OTHER): Payer: Medicare HMO | Admitting: Family Medicine

## 2019-12-22 ENCOUNTER — Encounter: Payer: Self-pay | Admitting: Family Medicine

## 2019-12-22 VITALS — BP 120/68 | HR 75 | Ht 62.0 in | Wt 166.2 lb

## 2019-12-22 DIAGNOSIS — E782 Mixed hyperlipidemia: Secondary | ICD-10-CM

## 2019-12-22 DIAGNOSIS — I4819 Other persistent atrial fibrillation: Secondary | ICD-10-CM

## 2019-12-22 DIAGNOSIS — I10 Essential (primary) hypertension: Secondary | ICD-10-CM

## 2019-12-22 DIAGNOSIS — I350 Nonrheumatic aortic (valve) stenosis: Secondary | ICD-10-CM | POA: Diagnosis not present

## 2019-12-22 NOTE — Patient Instructions (Signed)

## 2019-12-22 NOTE — Addendum Note (Signed)
Addended by: Laurine Blazer on: 12/22/2019 10:44 AM   Modules accepted: Orders

## 2019-12-30 ENCOUNTER — Ambulatory Visit (INDEPENDENT_AMBULATORY_CARE_PROVIDER_SITE_OTHER): Payer: Medicare HMO

## 2019-12-30 DIAGNOSIS — I350 Nonrheumatic aortic (valve) stenosis: Secondary | ICD-10-CM | POA: Diagnosis not present

## 2019-12-30 LAB — ECHOCARDIOGRAM COMPLETE
AR max vel: 0.85 cm2
AV Area VTI: 0.92 cm2
AV Area mean vel: 0.89 cm2
AV Mean grad: 23.4 mmHg
AV Peak grad: 45.6 mmHg
Ao pk vel: 3.38 m/s
Area-P 1/2: 2.96 cm2
Calc EF: 60.1 %
MV M vel: 3.75 m/s
MV Peak grad: 56.4 mmHg
P 1/2 time: 471 msec
S' Lateral: 2.69 cm
Single Plane A2C EF: 64.4 %
Single Plane A4C EF: 56.2 %

## 2020-01-01 ENCOUNTER — Telehealth: Payer: Self-pay | Admitting: Family Medicine

## 2020-01-01 DIAGNOSIS — I350 Nonrheumatic aortic (valve) stenosis: Secondary | ICD-10-CM

## 2020-01-01 NOTE — Telephone Encounter (Signed)
Calling for Echo results  

## 2020-01-01 NOTE — Telephone Encounter (Signed)
Pt voiced understanding - orders placed for repeat echo in 6 month and forwarded to schedulers

## 2020-01-01 NOTE — Telephone Encounter (Signed)
Verta Ellen., NP sent to Laurine Blazer, LPN Please call the patient and let her know the echocardiogram showed good pumping function of the heart. Has mildly leaking mitral valve and mild to moderate leaking of tricuspid valve and aortic valve. Aortic stenosis is moderate to severe. Please let her know we will perform another echo in 6 months to recheck the aortic valve. Go ahead and put the order in for repeat echo in 6 months.

## 2020-01-06 ENCOUNTER — Ambulatory Visit (INDEPENDENT_AMBULATORY_CARE_PROVIDER_SITE_OTHER): Payer: Medicare HMO | Admitting: *Deleted

## 2020-01-06 DIAGNOSIS — I4819 Other persistent atrial fibrillation: Secondary | ICD-10-CM

## 2020-01-06 DIAGNOSIS — Z5181 Encounter for therapeutic drug level monitoring: Secondary | ICD-10-CM

## 2020-01-06 LAB — POCT INR: INR: 2 (ref 2.0–3.0)

## 2020-01-06 NOTE — Patient Instructions (Signed)
Increase warfarin to 1 tablet daily except 1/2 tablet on Saturdays Recheck INR in 4 weeks.

## 2020-01-07 ENCOUNTER — Telehealth: Payer: Self-pay | Admitting: *Deleted

## 2020-01-07 DIAGNOSIS — Z85828 Personal history of other malignant neoplasm of skin: Secondary | ICD-10-CM | POA: Diagnosis not present

## 2020-01-07 DIAGNOSIS — D485 Neoplasm of uncertain behavior of skin: Secondary | ICD-10-CM | POA: Diagnosis not present

## 2020-01-07 DIAGNOSIS — L570 Actinic keratosis: Secondary | ICD-10-CM | POA: Diagnosis not present

## 2020-01-07 DIAGNOSIS — L821 Other seborrheic keratosis: Secondary | ICD-10-CM | POA: Diagnosis not present

## 2020-01-07 DIAGNOSIS — D2271 Melanocytic nevi of right lower limb, including hip: Secondary | ICD-10-CM | POA: Diagnosis not present

## 2020-01-07 NOTE — Telephone Encounter (Signed)
Laurine Blazer, LPN  96/12/6433 3:91 PM EST Back to Top    Left message to return call.

## 2020-01-07 NOTE — Telephone Encounter (Signed)
-----   Message from Verta Ellen., NP sent at 12/31/2019 10:44 PM EST ----- Please call the patient and let her know the echocardiogram showed good pumping function of the heart. Has mildly leaking mitral valve and mild to moderate leaking of tricuspid valve and aortic valve. Aortic stenosis is moderate to severe. Please let her know we will perform another echo in 6 months to recheck the aortic valve. Go ahead and put the order in for repeat echo in 6 months.

## 2020-01-07 NOTE — Telephone Encounter (Signed)
Laurine Blazer, LPN  73/40/3709 6:43 PM EST Back to Top    Notified, copy to pcp.

## 2020-01-08 ENCOUNTER — Other Ambulatory Visit: Payer: Medicare HMO

## 2020-01-22 DIAGNOSIS — I350 Nonrheumatic aortic (valve) stenosis: Secondary | ICD-10-CM | POA: Diagnosis not present

## 2020-01-22 DIAGNOSIS — I482 Chronic atrial fibrillation, unspecified: Secondary | ICD-10-CM | POA: Diagnosis not present

## 2020-01-22 DIAGNOSIS — Z79899 Other long term (current) drug therapy: Secondary | ICD-10-CM | POA: Diagnosis not present

## 2020-01-22 DIAGNOSIS — E785 Hyperlipidemia, unspecified: Secondary | ICD-10-CM | POA: Diagnosis not present

## 2020-01-22 DIAGNOSIS — E1129 Type 2 diabetes mellitus with other diabetic kidney complication: Secondary | ICD-10-CM | POA: Diagnosis not present

## 2020-01-26 ENCOUNTER — Other Ambulatory Visit: Payer: Self-pay | Admitting: Internal Medicine

## 2020-01-26 DIAGNOSIS — Z1231 Encounter for screening mammogram for malignant neoplasm of breast: Secondary | ICD-10-CM

## 2020-01-29 DIAGNOSIS — I482 Chronic atrial fibrillation, unspecified: Secondary | ICD-10-CM | POA: Diagnosis not present

## 2020-01-29 DIAGNOSIS — R7309 Other abnormal glucose: Secondary | ICD-10-CM | POA: Diagnosis not present

## 2020-01-29 DIAGNOSIS — I700 Atherosclerosis of aorta: Secondary | ICD-10-CM | POA: Diagnosis not present

## 2020-01-29 DIAGNOSIS — Z0001 Encounter for general adult medical examination with abnormal findings: Secondary | ICD-10-CM | POA: Diagnosis not present

## 2020-01-29 DIAGNOSIS — Z6830 Body mass index (BMI) 30.0-30.9, adult: Secondary | ICD-10-CM | POA: Diagnosis not present

## 2020-01-29 DIAGNOSIS — E1129 Type 2 diabetes mellitus with other diabetic kidney complication: Secondary | ICD-10-CM | POA: Diagnosis not present

## 2020-01-29 DIAGNOSIS — I350 Nonrheumatic aortic (valve) stenosis: Secondary | ICD-10-CM | POA: Diagnosis not present

## 2020-01-29 DIAGNOSIS — E785 Hyperlipidemia, unspecified: Secondary | ICD-10-CM | POA: Diagnosis not present

## 2020-01-29 DIAGNOSIS — M818 Other osteoporosis without current pathological fracture: Secondary | ICD-10-CM | POA: Diagnosis not present

## 2020-01-30 ENCOUNTER — Telehealth: Payer: Self-pay

## 2020-01-30 NOTE — Telephone Encounter (Signed)
error 

## 2020-02-03 ENCOUNTER — Ambulatory Visit (INDEPENDENT_AMBULATORY_CARE_PROVIDER_SITE_OTHER): Payer: Medicare HMO | Admitting: *Deleted

## 2020-02-03 DIAGNOSIS — I4819 Other persistent atrial fibrillation: Secondary | ICD-10-CM | POA: Diagnosis not present

## 2020-02-03 DIAGNOSIS — Z5181 Encounter for therapeutic drug level monitoring: Secondary | ICD-10-CM

## 2020-02-03 LAB — POCT INR: INR: 2.4 (ref 2.0–3.0)

## 2020-02-03 NOTE — Patient Instructions (Signed)
Continue warfarin 1 tablet daily except 1/2 tablet on Saturdays Recheck INR in 6 weeks. 

## 2020-02-11 ENCOUNTER — Ambulatory Visit: Payer: Medicare HMO

## 2020-03-16 ENCOUNTER — Ambulatory Visit (INDEPENDENT_AMBULATORY_CARE_PROVIDER_SITE_OTHER): Payer: Medicare HMO | Admitting: *Deleted

## 2020-03-16 ENCOUNTER — Other Ambulatory Visit: Payer: Self-pay

## 2020-03-16 DIAGNOSIS — Z5181 Encounter for therapeutic drug level monitoring: Secondary | ICD-10-CM | POA: Diagnosis not present

## 2020-03-16 DIAGNOSIS — I4819 Other persistent atrial fibrillation: Secondary | ICD-10-CM | POA: Diagnosis not present

## 2020-03-16 LAB — POCT INR: INR: 2.9 (ref 2.0–3.0)

## 2020-03-16 NOTE — Patient Instructions (Signed)
Continue warfarin 1 tablet daily except 1/2 tablet on Saturdays Recheck INR in 6 weeks. 

## 2020-03-19 ENCOUNTER — Other Ambulatory Visit: Payer: Self-pay

## 2020-03-19 ENCOUNTER — Ambulatory Visit
Admission: RE | Admit: 2020-03-19 | Discharge: 2020-03-19 | Disposition: A | Discharge status: Home / Self Care | Payer: Medicare HMO | Source: Ambulatory Visit | Attending: Internal Medicine | Admitting: Internal Medicine

## 2020-03-19 DIAGNOSIS — Z1231 Encounter for screening mammogram for malignant neoplasm of breast: Secondary | ICD-10-CM

## 2020-04-27 ENCOUNTER — Ambulatory Visit (INDEPENDENT_AMBULATORY_CARE_PROVIDER_SITE_OTHER): Payer: Medicare HMO | Admitting: *Deleted

## 2020-04-27 ENCOUNTER — Other Ambulatory Visit: Payer: Self-pay

## 2020-04-27 DIAGNOSIS — I4819 Other persistent atrial fibrillation: Secondary | ICD-10-CM

## 2020-04-27 DIAGNOSIS — Z5181 Encounter for therapeutic drug level monitoring: Secondary | ICD-10-CM

## 2020-04-27 LAB — POCT INR: INR: 3.5 — AB (ref 2.0–3.0)

## 2020-04-27 NOTE — Patient Instructions (Signed)
Hold warfarin tonight then resume 1 tablet daily except 1/2 tablet on Saturdays Recheck INR in 4 weeks.

## 2020-05-24 DIAGNOSIS — E1129 Type 2 diabetes mellitus with other diabetic kidney complication: Secondary | ICD-10-CM | POA: Diagnosis not present

## 2020-05-25 ENCOUNTER — Ambulatory Visit (INDEPENDENT_AMBULATORY_CARE_PROVIDER_SITE_OTHER): Payer: Medicare HMO | Admitting: *Deleted

## 2020-05-25 ENCOUNTER — Other Ambulatory Visit: Payer: Self-pay

## 2020-05-25 DIAGNOSIS — Z5181 Encounter for therapeutic drug level monitoring: Secondary | ICD-10-CM

## 2020-05-25 DIAGNOSIS — I4819 Other persistent atrial fibrillation: Secondary | ICD-10-CM | POA: Diagnosis not present

## 2020-05-25 LAB — POCT INR: INR: 2.4 (ref 2.0–3.0)

## 2020-05-25 NOTE — Patient Instructions (Signed)
Continue warfarin 1 tablet daily except 1/2 tablet on Saturdays Recheck INR in 6 weeks.

## 2020-05-31 DIAGNOSIS — I350 Nonrheumatic aortic (valve) stenosis: Secondary | ICD-10-CM | POA: Diagnosis not present

## 2020-05-31 DIAGNOSIS — E1122 Type 2 diabetes mellitus with diabetic chronic kidney disease: Secondary | ICD-10-CM | POA: Diagnosis not present

## 2020-05-31 DIAGNOSIS — R7309 Other abnormal glucose: Secondary | ICD-10-CM | POA: Diagnosis not present

## 2020-07-06 DIAGNOSIS — Z85828 Personal history of other malignant neoplasm of skin: Secondary | ICD-10-CM | POA: Diagnosis not present

## 2020-07-06 DIAGNOSIS — L570 Actinic keratosis: Secondary | ICD-10-CM | POA: Diagnosis not present

## 2020-07-06 DIAGNOSIS — I781 Nevus, non-neoplastic: Secondary | ICD-10-CM | POA: Diagnosis not present

## 2020-07-06 DIAGNOSIS — D1801 Hemangioma of skin and subcutaneous tissue: Secondary | ICD-10-CM | POA: Diagnosis not present

## 2020-07-07 ENCOUNTER — Ambulatory Visit (INDEPENDENT_AMBULATORY_CARE_PROVIDER_SITE_OTHER): Payer: Medicare HMO

## 2020-07-07 ENCOUNTER — Ambulatory Visit (INDEPENDENT_AMBULATORY_CARE_PROVIDER_SITE_OTHER): Payer: Medicare HMO | Admitting: *Deleted

## 2020-07-07 ENCOUNTER — Telehealth: Payer: Self-pay | Admitting: *Deleted

## 2020-07-07 ENCOUNTER — Other Ambulatory Visit: Payer: Self-pay | Admitting: *Deleted

## 2020-07-07 DIAGNOSIS — I350 Nonrheumatic aortic (valve) stenosis: Secondary | ICD-10-CM

## 2020-07-07 DIAGNOSIS — I4819 Other persistent atrial fibrillation: Secondary | ICD-10-CM | POA: Diagnosis not present

## 2020-07-07 DIAGNOSIS — Z5181 Encounter for therapeutic drug level monitoring: Secondary | ICD-10-CM | POA: Diagnosis not present

## 2020-07-07 LAB — ECHOCARDIOGRAM COMPLETE
AR max vel: 0.74 cm2
AV Area VTI: 0.82 cm2
AV Area mean vel: 0.79 cm2
AV Mean grad: 32 mmHg
AV Peak grad: 51.8 mmHg
AV Vena cont: 0.37 cm
Ao pk vel: 3.6 m/s
Area-P 1/2: 2.76 cm2
Calc EF: 60.1 %
MV M vel: 3.58 m/s
MV Peak grad: 51.2 mmHg
P 1/2 time: 403 msec
S' Lateral: 2.63 cm
Single Plane A2C EF: 57.7 %
Single Plane A4C EF: 61.7 %

## 2020-07-07 LAB — POCT INR: INR: 3.3 — AB (ref 2.0–3.0)

## 2020-07-07 NOTE — Addendum Note (Signed)
Addended by: Julian Hy T on: 07/07/2020 02:20 PM   Modules accepted: Orders

## 2020-07-07 NOTE — Telephone Encounter (Signed)
-----   Message from Theora Gianotti, NP sent at 07/07/2020  1:04 PM EDT ----- Relatively stable findings.  Normal heart squeezing function.  Aortic stenosis remains moderate to severe.  Mitral and tricuspid valves remain mildly and mildly to moderately leaky respectively.  She will need a f/u echo in 6 mos w/ office f/u shortly thereafter.

## 2020-07-07 NOTE — Patient Instructions (Signed)
Decrease warfarin to 1 tablet daily except 1/2 tablet on Wednesdays and Saturdays Recheck INR in 6 weeks.

## 2020-07-07 NOTE — Telephone Encounter (Signed)
Pt voiced understanding - echo order placed for 6 months

## 2020-07-21 ENCOUNTER — Other Ambulatory Visit: Payer: Self-pay | Admitting: *Deleted

## 2020-07-21 DIAGNOSIS — I350 Nonrheumatic aortic (valve) stenosis: Secondary | ICD-10-CM

## 2020-07-21 NOTE — Progress Notes (Signed)
done

## 2020-08-25 ENCOUNTER — Ambulatory Visit (INDEPENDENT_AMBULATORY_CARE_PROVIDER_SITE_OTHER): Payer: Medicare HMO | Admitting: *Deleted

## 2020-08-25 ENCOUNTER — Other Ambulatory Visit: Payer: Self-pay

## 2020-08-25 DIAGNOSIS — I4819 Other persistent atrial fibrillation: Secondary | ICD-10-CM | POA: Diagnosis not present

## 2020-08-25 DIAGNOSIS — Z5181 Encounter for therapeutic drug level monitoring: Secondary | ICD-10-CM | POA: Diagnosis not present

## 2020-08-25 LAB — POCT INR: INR: 2.6 (ref 2.0–3.0)

## 2020-08-25 NOTE — Patient Instructions (Signed)
Continue warfarin 1 tablet daily except 1/2 tablet on Wednesdays and Saturdays Recheck INR in 6 weeks.

## 2020-09-22 DIAGNOSIS — E1129 Type 2 diabetes mellitus with other diabetic kidney complication: Secondary | ICD-10-CM | POA: Diagnosis not present

## 2020-09-29 DIAGNOSIS — I480 Paroxysmal atrial fibrillation: Secondary | ICD-10-CM | POA: Diagnosis not present

## 2020-09-29 DIAGNOSIS — R7309 Other abnormal glucose: Secondary | ICD-10-CM | POA: Diagnosis not present

## 2020-09-29 DIAGNOSIS — E1122 Type 2 diabetes mellitus with diabetic chronic kidney disease: Secondary | ICD-10-CM | POA: Diagnosis not present

## 2020-09-29 DIAGNOSIS — I350 Nonrheumatic aortic (valve) stenosis: Secondary | ICD-10-CM | POA: Diagnosis not present

## 2020-10-06 ENCOUNTER — Ambulatory Visit: Payer: Medicare HMO | Admitting: *Deleted

## 2020-10-06 ENCOUNTER — Other Ambulatory Visit: Payer: Self-pay

## 2020-10-06 DIAGNOSIS — I4819 Other persistent atrial fibrillation: Secondary | ICD-10-CM

## 2020-10-06 DIAGNOSIS — Z5181 Encounter for therapeutic drug level monitoring: Secondary | ICD-10-CM | POA: Diagnosis not present

## 2020-10-06 LAB — POCT INR: INR: 2.6 (ref 2.0–3.0)

## 2020-10-06 NOTE — Patient Instructions (Signed)
Continue warfarin 1 tablet daily except 1/2 tablet on Wednesdays and Saturdays Recheck INR in 6 weeks.

## 2020-10-13 DIAGNOSIS — I10 Essential (primary) hypertension: Secondary | ICD-10-CM | POA: Diagnosis not present

## 2020-10-13 DIAGNOSIS — Z20822 Contact with and (suspected) exposure to covid-19: Secondary | ICD-10-CM | POA: Diagnosis not present

## 2020-10-13 DIAGNOSIS — I4819 Other persistent atrial fibrillation: Secondary | ICD-10-CM | POA: Diagnosis not present

## 2020-10-13 DIAGNOSIS — Z7901 Long term (current) use of anticoagulants: Secondary | ICD-10-CM | POA: Diagnosis not present

## 2020-10-13 DIAGNOSIS — E119 Type 2 diabetes mellitus without complications: Secondary | ICD-10-CM | POA: Diagnosis not present

## 2020-10-13 DIAGNOSIS — I4891 Unspecified atrial fibrillation: Secondary | ICD-10-CM | POA: Diagnosis not present

## 2020-10-13 DIAGNOSIS — R0602 Shortness of breath: Secondary | ICD-10-CM | POA: Diagnosis not present

## 2020-10-14 DIAGNOSIS — E119 Type 2 diabetes mellitus without complications: Secondary | ICD-10-CM | POA: Diagnosis not present

## 2020-10-14 DIAGNOSIS — I444 Left anterior fascicular block: Secondary | ICD-10-CM | POA: Diagnosis not present

## 2020-10-14 DIAGNOSIS — I4891 Unspecified atrial fibrillation: Secondary | ICD-10-CM | POA: Diagnosis not present

## 2020-10-14 DIAGNOSIS — R0600 Dyspnea, unspecified: Secondary | ICD-10-CM | POA: Insufficient documentation

## 2020-10-14 DIAGNOSIS — I10 Essential (primary) hypertension: Secondary | ICD-10-CM | POA: Diagnosis not present

## 2020-10-14 DIAGNOSIS — I4819 Other persistent atrial fibrillation: Secondary | ICD-10-CM | POA: Diagnosis not present

## 2020-11-11 ENCOUNTER — Telehealth: Payer: Self-pay | Admitting: Cardiology

## 2020-11-12 NOTE — Telephone Encounter (Signed)
ERROR

## 2020-11-16 DIAGNOSIS — I5030 Unspecified diastolic (congestive) heart failure: Secondary | ICD-10-CM | POA: Diagnosis not present

## 2020-11-16 DIAGNOSIS — I350 Nonrheumatic aortic (valve) stenosis: Secondary | ICD-10-CM | POA: Diagnosis not present

## 2020-11-17 ENCOUNTER — Ambulatory Visit (INDEPENDENT_AMBULATORY_CARE_PROVIDER_SITE_OTHER): Payer: Medicare HMO | Admitting: *Deleted

## 2020-11-17 DIAGNOSIS — I4819 Other persistent atrial fibrillation: Secondary | ICD-10-CM | POA: Diagnosis not present

## 2020-11-17 DIAGNOSIS — Z7901 Long term (current) use of anticoagulants: Secondary | ICD-10-CM | POA: Diagnosis not present

## 2020-11-17 LAB — POCT INR: INR: 2.1 (ref 2.0–3.0)

## 2020-11-17 NOTE — Patient Instructions (Signed)
Continue warfarin 1 tablet daily except 1/2 tablet on Wednesdays and Saturdays Recheck INR in 6 weeks.

## 2020-11-24 DIAGNOSIS — H1045 Other chronic allergic conjunctivitis: Secondary | ICD-10-CM | POA: Diagnosis not present

## 2020-11-24 DIAGNOSIS — E119 Type 2 diabetes mellitus without complications: Secondary | ICD-10-CM | POA: Diagnosis not present

## 2020-11-24 DIAGNOSIS — Z961 Presence of intraocular lens: Secondary | ICD-10-CM | POA: Diagnosis not present

## 2020-11-24 DIAGNOSIS — H04123 Dry eye syndrome of bilateral lacrimal glands: Secondary | ICD-10-CM | POA: Diagnosis not present

## 2020-12-06 DIAGNOSIS — Z01118 Encounter for examination of ears and hearing with other abnormal findings: Secondary | ICD-10-CM | POA: Diagnosis not present

## 2020-12-06 DIAGNOSIS — H903 Sensorineural hearing loss, bilateral: Secondary | ICD-10-CM | POA: Diagnosis not present

## 2020-12-08 ENCOUNTER — Other Ambulatory Visit: Payer: Self-pay | Admitting: Cardiology

## 2020-12-22 ENCOUNTER — Other Ambulatory Visit: Payer: Medicare HMO

## 2020-12-27 ENCOUNTER — Other Ambulatory Visit: Payer: Self-pay

## 2020-12-27 ENCOUNTER — Ambulatory Visit (HOSPITAL_COMMUNITY)
Admission: RE | Admit: 2020-12-27 | Discharge: 2020-12-27 | Disposition: A | Discharge status: Home / Self Care | Payer: Medicare HMO | Source: Ambulatory Visit | Attending: Family Medicine | Admitting: Family Medicine

## 2020-12-27 DIAGNOSIS — I350 Nonrheumatic aortic (valve) stenosis: Secondary | ICD-10-CM | POA: Diagnosis not present

## 2020-12-27 LAB — ECHOCARDIOGRAM COMPLETE
AR max vel: 0.98 cm2
AV Area VTI: 0.95 cm2
AV Area mean vel: 1.03 cm2
AV Mean grad: 29 mmHg
AV Peak grad: 47.9 mmHg
Ao pk vel: 3.46 m/s
Area-P 1/2: 2.22 cm2
P 1/2 time: 320 msec
S' Lateral: 2.6 cm

## 2020-12-27 NOTE — Progress Notes (Signed)
*  PRELIMINARY RESULTS* Echocardiogram 2D Echocardiogram has been performed.  Anne Wolf 12/27/2020, 10:53 AM

## 2020-12-29 NOTE — Progress Notes (Signed)
Cardiology Office Note  Date: 12/30/2020   ID: Anne Wolf, Anne Wolf [birth year], MRN [id number]  PCP:  Asencion Noble, MD  Cardiologist:  Carlyle Dolly, MD Electrophysiologist:  None   Chief Complaint: Persistent atrial fibrillation  History of Present Illness: Anne Wolf is a 87 y.o. female with a history of persistent atrial fibrillation, HTN, HLD, aortic stenosis.  Last saw Anne Wolf, [place] 07/31/2018.  At that time she denied any chest pain, DOE, palpitations, orthopnea, PND, edema.  Heart rate was well controlled in the 70s and 80s.  She was continuing Toprol-XL.  She was continuing Eliquis p.o. twice daily.  Blood pressure was well controlled.  She was continue lisinopril 10 mg daily and Toprol-XL 12.5 mg p.o. twice daily.  Lipids were followed by PCP she was continuing simvastatin 40 mg daily.  Aortic stenosis was mild to moderate by echo 2018.  No recent symptoms.  Anticipating repeat echo the following year.  She was last here 1 year follow-up.  She denies any recent acute illnesses or hospitalizations.  She states she could not tell if she was in atrial fibrillation.  She never felt any palpitations.  She denied any anginal or exertional symptoms, orthostatic symptoms, CVA or TIA-like symptoms, bleeding issues, PND, orthopnea, EKG today shows atrial fibrillation woman with LAFB rate of 83.  She is here for follow-up on recent echocardiogram for aortic stenosis.  She states she recently visited [place] via plane.  She states after the plane trip she started having some issues with being unable to speak and shortness of breath.  She went to the emergency room in [place] and was given 1 dose of furosemide 20 mg with relief.  She states she was told she had heart failure with fluid in her lungs and fluid around her heart.  She denies having any diagnostics other than some lab work.  She has paperwork from [facility] which only indicates she received 1  dose of Lasix.  She states she feels extremely tired and has no energy.  States she has a lack of appetite.  She has had some cauterization in her nose in the past for bleeding.  She states she has lost her sense of taste and smell but since of taste is slowly coming back.  Her primary complaint is feeling tired and activity intolerance.  She does have some minor exertional shortness of breath.  She is concerned over recent increase in fatigue since the episode at the emergency room in [place].  She had a recent echocardiogram on 12/27/2020 which showed Follow-up on echocardiogram  Past Medical History:  Diagnosis Date   Aortic stenosis    mild; mild MR; slightly increased pulmonary artery pressure with mild RVH; normal LV-2010   Arthritis    Atrial fibrillation (HCC)    chronic anticoagulation; adequate HR control on minimal AV nodal blocking medication    Cancer ([place])    skin cancers removed from face   CHF (congestive heart failure) ([place])    Diabetes mellitus without complication ([place])    2   Dysrhythmia    A FIB   Fasting hyperglycemia    + microalbuminuria; A1c of 6.5% in 11/2010   Hematochezia 01/2009   01/2009-presumed ischemic colitis; history of diverticulosis   History of kidney stones    Hx of adenomatous colonic polyps 2005   adenomatous polyp; negative colonoscopy in 2009   Hyperlipidemia     Past Surgical History:  Procedure Laterality Date   CATARACT EXTRACTION  W/PHACO Right 03/23/2014   Procedure: CATARACT EXTRACTION PHACO AND INTRAOCULAR LENS PLACEMENT (IOC);  Surgeon: Williams Che, MD;  Location: [facility];  Service: Ophthalmology;  Laterality: Right;  CDE:2.83   CATARACT EXTRACTION W/PHACO Left 07/27/2014   Procedure: CATARACT EXTRACTION PHACO AND INTRAOCULAR LENS PLACEMENT LEFT EYE CDE=6.97;  Surgeon: Williams Che, MD;  Location: [facility];  Service: Ophthalmology;  Laterality: Left;   COLONOSCOPY  2009   negative; prior study with adenomatous polyp    CYSTOSCOPY/URETEROSCOPY/HOLMIUM LASER/STENT PLACEMENT Right 08/21/2016   Procedure: CYSTOSCOPY/URETEROSCOPY/RETROGRADE PYELOGRAM//STENT PLACEMENT;  Surgeon: Nickie Retort, MD;  Location: [facility];  Service: Urology;  Laterality: Right;   EYE SURGERY      Current Outpatient Medications  Medication Sig Dispense Refill   alendronate (FOSAMAX) 70 MG tablet Take 70 mg by mouth once a week.     calcium carbonate (OSCAL) 1500 (600 Ca) MG TABS tablet Take 600 mg of elemental calcium by mouth daily.     cholecalciferol (VITAMIN D) 1000 units tablet Take 1,000 Units by mouth daily.     Co-Enzyme Q-10 100 MG CAPS Take 1 capsule by mouth at bedtime.      lisinopril (PRINIVIL,ZESTRIL) 10 MG tablet Take 10 mg by mouth every evening.      loratadine (CLARITIN) 10 MG tablet Take 10 mg by mouth as needed.      metFORMIN (GLUCOPHAGE) 500 MG tablet Take 500 mg by mouth 2 (two) times daily with a meal.      metoprolol succinate (TOPROL-XL) 25 MG 24 hr tablet Take 25 mg by mouth daily.      Multiple Vitamins-Minerals (CENTRUM SILVER) tablet Take 1 tablet by mouth at bedtime.      simvastatin (ZOCOR) 40 MG tablet Take 40 mg by mouth at bedtime.       warfarin (COUMADIN) 2 MG tablet Take 1 tablet daily except 1/2 tablet on Wednesdays and Saturdays or as directed 90 tablet 3   No current facility-administered medications for this visit.   Allergies:  Patient has no known allergies.   Social History: The patient  reports that she has never smoked. She has never used smokeless tobacco. She reports that she does not drink alcohol and does not use drugs.   Family History: The patient's family history includes Arrhythmia in her sister; Cancer in her father.   ROS:  Please see the history of present illness. Otherwise, complete review of systems is positive for none.  All other systems are reviewed and negative.   Physical Exam: VS:  BP 120/70   Pulse 70   Ht 5\' 2"  (1.575 m)   Wt 152 lb (68.9 kg)   SpO2 97%    BMI 27.80 kg/m , BMI Body mass index is 27.8 kg/m.  Wt Readings from Last 3 Encounters:  12/30/20 152 lb (68.9 kg)  12/22/19 166 lb 3.2 oz (75.4 kg)  07/31/18 177 lb (80.3 kg)    General: Patient appears comfortable at rest. Neck: Supple, no elevated JVP or carotid bruits, no thyromegaly. Lungs: Clear to auscultation, nonlabored breathing at rest. Cardiac: Irregularly irregular rate and rhythm, no S3 , 2/6 systolic murmur best heard at right upper sternal border.  No pericardial rub. Extremities: No pitting edema, distal pulses 2+. Skin: Warm and dry. Musculoskeletal: No kyphosis. Neuropsychiatric: Alert and oriented x3, affect grossly appropriate.  ECG: 12/30/2020 atrial fibrillation rate 92, left axis deviation, cannot rule out anterior infarct age undetermined.  Recent Labwork: No results found for requested labs within last  8760 hours.     Component Value Date/Time   CHOL 182 03/24/2008 0000   TRIG 127 03/24/2008 0000   HDL 49 03/24/2008 0000   LDLCALC 108 03/24/2008 0000   LDLCALC 108 03/24/2008 0000    Other Studies Reviewed Today:  Echocardiogram 12/27/2020  1. Left ventricular ejection fraction, by estimation, is 60 to 65%. The  left ventricle has normal function. The left ventricle has no regional  wall motion abnormalities. Left ventricular diastolic parameters are  indeterminate. Basal septal hypertrophy  noted.   2. Right ventricular systolic function is normal. The right ventricular  size is normal. There is mildly elevated pulmonary artery systolic  pressure.   3. Left atrial size was severely dilated.   4. Right atrial size was severely dilated.   5. Mitral Valve area by continuity equation 1.79 cm; this is in the  setting of aortic insuffiency. The mitral valve is degenerative. Mild  mitral valve regurgitation. Mild mitral stenosis. The mean mitral valve  gradient is 10.0 mmHg with average heart  rate of 96 bpm.   6. The aortic valve is calcified.  Aortic valve regurgitation is mild to  moderate. Moderate to severe aortic valve stenosis. Aortic valve area, by  VTI measures 0.95 cm. Aortic valve mean gradient measures 29.0 mmHg.  Aortic valve Vmax measures 3.46 m/s.  Normal LV stroke volume with with DVI 0.3   7. The inferior vena cava is dilated in size with >50% respiratory  variability, suggesting right atrial pressure of 8 mmHg.   Comparison(s): A prior study was performed on 07/07/2020. Similar mitral  and aortic gradients.      Echocardiogram: 08/2016 Study Conclusions   - Left ventricle: The cavity size was normal. Wall thickness was   increased in a pattern of mild LVH. Systolic function was normal.   The estimated ejection fraction was in the range of 55% to 60%. - Aortic valve: AV is thickened, calcified with mildly restricted   motion Peak and mean gradients through the valve are 30 and 17 mm   Hg respectively. There was mild regurgitation. - Mitral valve: Calcified annulus. Mildly thickened leaflets .   There was mild regurgitation. - Left atrium: The atrium was mildly dilated. - Right atrium: The atrium was mildly dilated. - Pulmonary arteries: PA peak pressure: 49 mm Hg (S).    Assessment and Plan:  1. Persistent atrial fibrillation ([place])   2. Essential hypertension   3. Mixed hyperlipidemia   4. Aortic valve stenosis, etiology of cardiac valve disease unspecified     1. Persistent atrial fibrillation (HCC) EKG today demonstrates atrial fibrillation with a rate of 92, left axis deviation.  Cannot rule out anterior infarct.  States she cannot tell she is in atrial fibrillation unless she checks her pulse.  Continue Toprol-XL 25 mg daily.  Continue Coumadin as directed.  2. Essential hypertension Blood pressure well controlled on current therapy at 120/70.  Continue lisinopril 10 mg daily, continue Toprol-XL 25 mg daily.  3. Mixed hyperlipidemia Continue simvastatin 40 mg daily.  Patient states she has had  no recent lab work at PCP office  4. Aortic valve stenosis, etiology of cardiac valve disease unspecified EF 60 to 65%.  No WMA's, indeterminate diastolic parameters, basal septal hypertrophy, mildly elevated PASP, LA severely dilated, RA severely dilated, mitral valve by continuity equation 1.79 cm in the setting of aortic insufficiency.  Mild mitral regurgitation, mild mitral stenosis.  Mean mitral valve gradient 10 mmHg with average heart rate of  96.  Aortic valve regurgitation mild to moderate.  Moderate to severe aortic valve stenosis.  Aortic valve area by VTI measures 0.95 cm.  Aortic valve mean gradient 29.0 mmHg.  Aortic valve V-max measures 3.46 m/s.  Normal LV stroke-volume with DVI 0.3.  Similar study on 07/08/2010 showed similar mitral and aortic gradients.  Patient states the doctors in ED during [place] visit mentioned the combination of pressurized cabin of airplane ride combined with history of aortic stenosis may have caused her shortness of breath.  Refer to structural heart team Dr. Burt Knack.  5.  Significant fatigue. Complaining of increased fatigue since her trip to [place] and episode in the emergency room with initial complaints of being unable to speak and short of breath.  She received 1 dose of furosemide 20 mg with relief.  She states she was told she had heart failure.  She states she had no diagnostic tests during her ED visit in [place] other than lab work.  She has no clinical symptoms of heart failure other than significant fatigue and some mild DOE.  Fatigue could be an anginal equivalent.  Please get a Lexiscan stress test.  Medication Adjustments/Labs and Tests Ordered: Current medicines are reviewed at length with the patient today.  Concerns regarding medicines are outlined above.   Disposition: Follow-up with Dr. Harl Bowie or APP 2 months  Signed, Levell July, NP 12/30/2020 10:21 AM    [facility] [place],  [place], [place] [address] Phone: [phone]; Fax: [phone]

## 2020-12-30 ENCOUNTER — Ambulatory Visit: Payer: Medicare HMO | Admitting: *Deleted

## 2020-12-30 ENCOUNTER — Other Ambulatory Visit: Payer: Self-pay

## 2020-12-30 ENCOUNTER — Encounter: Payer: Self-pay | Admitting: Family Medicine

## 2020-12-30 ENCOUNTER — Telehealth: Payer: Self-pay | Admitting: Family Medicine

## 2020-12-30 ENCOUNTER — Ambulatory Visit (INDEPENDENT_AMBULATORY_CARE_PROVIDER_SITE_OTHER): Payer: Medicare HMO | Admitting: Family Medicine

## 2020-12-30 ENCOUNTER — Encounter: Payer: Self-pay | Admitting: *Deleted

## 2020-12-30 VITALS — BP 120/70 | HR 70 | Ht 62.0 in | Wt 152.0 lb

## 2020-12-30 DIAGNOSIS — I4819 Other persistent atrial fibrillation: Secondary | ICD-10-CM

## 2020-12-30 DIAGNOSIS — R0602 Shortness of breath: Secondary | ICD-10-CM

## 2020-12-30 DIAGNOSIS — E782 Mixed hyperlipidemia: Secondary | ICD-10-CM | POA: Diagnosis not present

## 2020-12-30 DIAGNOSIS — I10 Essential (primary) hypertension: Secondary | ICD-10-CM

## 2020-12-30 DIAGNOSIS — I350 Nonrheumatic aortic (valve) stenosis: Secondary | ICD-10-CM | POA: Diagnosis not present

## 2020-12-30 DIAGNOSIS — Z5181 Encounter for therapeutic drug level monitoring: Secondary | ICD-10-CM

## 2020-12-30 LAB — POCT INR: INR: 2.3 (ref 2.0–3.0)

## 2020-12-30 NOTE — Telephone Encounter (Signed)
Checking percert on the following patient for testing scheduled at [facility].     LEXISCAN   01/07/2021

## 2020-12-30 NOTE — Patient Instructions (Signed)
Continue warfarin 1 tablet daily except 1/2 tablet on Wednesdays and Saturdays Recheck INR in 6 weeks.

## 2020-12-30 NOTE — Addendum Note (Signed)
Addended by: Levonne Hubert on: 12/30/2020 02:19 PM   Modules accepted: Orders

## 2020-12-30 NOTE — Patient Instructions (Signed)
Medication Instructions:  Your physician recommends that you continue on your current medications as directed. Please refer to the Current Medication list given to you today.  *If you need a refill on your cardiac medications before your next appointment, please call your pharmacy*   Lab Work: NONE   If you have labs (blood work) drawn today and your tests are completely normal, you will receive your results only by: [place] (if you have MyChart) OR A paper copy in the mail If you have any lab test that is abnormal or we need to change your treatment, we will call you to review the results.   Testing/Procedures: Your physician has requested that you have a lexiscan myoview. For further information please visit [email or web address]. Please follow instruction sheet, as given.    Follow-Up: At [facility], you and your health needs are our priority.  As part of our continuing mission to provide you with exceptional heart care, we have created designated Provider Care Teams.  These Care Teams include your primary Cardiologist (physician) and Advanced Practice Providers (APPs -  Physician Assistants and Nurse Practitioners) who all work together to provide you with the care you need, when you need it.  We recommend signing up for the patient portal called "MyChart".  Sign up information is provided on this After Visit Summary.  MyChart is used to connect with patients for Virtual Visits (Telemedicine).  Patients are able to view lab/test results, encounter notes, upcoming appointments, etc.  Non-urgent messages can be sent to your provider as well.   To learn more about what you can do with MyChart, go to [email or web address].    Your next appointment:   2 month(s)  The format for your next appointment:   In Person  Provider:   Carlyle Dolly, MD    Other Instructions Thank you for choosing [facility]!

## 2021-01-07 ENCOUNTER — Other Ambulatory Visit: Payer: Self-pay

## 2021-01-07 ENCOUNTER — Encounter (HOSPITAL_COMMUNITY): Payer: Self-pay

## 2021-01-07 ENCOUNTER — Ambulatory Visit (HOSPITAL_COMMUNITY)
Admission: RE | Admit: 2021-01-07 | Discharge: 2021-01-07 | Disposition: A | Discharge status: Home / Self Care | Payer: Medicare HMO | Source: Ambulatory Visit | Attending: Family Medicine | Admitting: Family Medicine

## 2021-01-07 ENCOUNTER — Encounter (HOSPITAL_COMMUNITY)
Admission: RE | Admit: 2021-01-07 | Discharge: 2021-01-07 | Disposition: A | Discharge status: Home / Self Care | Payer: Medicare HMO | Source: Ambulatory Visit | Attending: Family Medicine | Admitting: Family Medicine

## 2021-01-07 DIAGNOSIS — R0602 Shortness of breath: Secondary | ICD-10-CM | POA: Diagnosis not present

## 2021-01-07 LAB — NM MYOCAR MULTI W/SPECT W/WALL MOTION / EF
LV dias vol: 59 mL (ref 46–106)
LV sys vol: 17 mL
Nuc Stress EF: 71 %
Peak HR: 102 {beats}/min
RATE: 0.4
Rest HR: 79 {beats}/min
Rest Nuclear Isotope Dose: 8 mCi
SDS: 1
SRS: 2
SSS: 3
ST Depression (mm): 0 mm
Stress Nuclear Isotope Dose: 22.5 mCi

## 2021-01-07 MED ORDER — TECHNETIUM TC 99M TETROFOSMIN IV KIT
21.0000 | PACK | Freq: Once | INTRAVENOUS | Status: AC | PRN
  Administered 2021-01-07: 22.5 via INTRAVENOUS

## 2021-01-07 MED ORDER — SODIUM CHLORIDE FLUSH 0.9 % IV SOLN
INTRAVENOUS | Status: AC
  Administered 2021-01-07: 10 mL via INTRAVENOUS
  Filled 2021-01-07: qty 10

## 2021-01-07 MED ORDER — TECHNETIUM TC 99M TETROFOSMIN IV KIT
7.0000 | PACK | Freq: Once | INTRAVENOUS | Status: AC | PRN
  Administered 2021-01-07: 8 via INTRAVENOUS

## 2021-01-07 MED ORDER — REGADENOSON 0.4 MG/5ML IV SOLN
INTRAVENOUS | Status: AC
  Administered 2021-01-07: 0.4 mg via INTRAVENOUS
  Filled 2021-01-07: qty 5

## 2021-01-12 ENCOUNTER — Telehealth: Payer: Self-pay | Admitting: *Deleted

## 2021-01-12 NOTE — Telephone Encounter (Signed)
Laurine Blazer, LPN  [id number]/[id number]  5:28 PM EST Back to Top    Notified, copy to pcp.    Laurine Blazer, LPN  41/32/4401  0:27 PM EST     Left message to return call.

## 2021-01-12 NOTE — Telephone Encounter (Signed)
STRESS TEST -   Please call the patient and let her know that a stress test was considered to be low risk by the physician who interpreted the study.  No evidence of decreased blood flow.  Pumping function was good.   Verta Ellen, NP  01/09/2021 4:55 PM    ECHO -   Please call the patient and let her know the echocardiogram showed she has good pumping function of the heart.  There is some mild muscle tightness in the main pumping chamber.  Best management is to keep blood pressure at or below 130/80 consistently and manage all other risk factors.  She continues with some mild mitral valve regurgitation and mild mitral stenosis.  Aortic stenosis is considered moderate to severe.  This is basically unchanged from prior study on 07/07/2020   Verta Ellen, NP  12/27/2020 1:27 PM

## 2021-01-17 DIAGNOSIS — B0233 Zoster keratitis: Secondary | ICD-10-CM | POA: Diagnosis not present

## 2021-01-18 ENCOUNTER — Encounter: Payer: Self-pay | Admitting: Cardiovascular Disease

## 2021-01-18 ENCOUNTER — Ambulatory Visit: Payer: Medicare HMO | Admitting: Cardiovascular Disease

## 2021-01-18 ENCOUNTER — Other Ambulatory Visit: Payer: Self-pay

## 2021-01-18 VITALS — BP 126/82 | HR 87 | Ht 62.0 in | Wt 152.6 lb

## 2021-01-18 DIAGNOSIS — Z01818 Encounter for other preprocedural examination: Secondary | ICD-10-CM

## 2021-01-18 DIAGNOSIS — I350 Nonrheumatic aortic (valve) stenosis: Secondary | ICD-10-CM | POA: Diagnosis not present

## 2021-01-18 DIAGNOSIS — Z01812 Encounter for preprocedural laboratory examination: Secondary | ICD-10-CM | POA: Diagnosis not present

## 2021-01-18 NOTE — Patient Instructions (Signed)
Medication Instructions:  No changes *If you need a refill on your cardiac medications before your next appointment, please call your pharmacy*   Lab Work: Today: BMET, CBC If you have labs (blood work) drawn today and your tests are completely normal, you will receive your results only by: [place] (if you have MyChart) OR A paper copy in the mail If you have any lab test that is abnormal or we need to change your treatment, we will call you to review the results.   Testing/Procedures: Your physician has requested that you have a cardiac catheterization. Cardiac catheterization is used to diagnose and/or treat various heart conditions. Doctors may recommend this procedure for a number of different reasons. The most common reason is to evaluate chest pain. Chest pain can be a symptom of coronary artery disease (CAD), and cardiac catheterization can show whether plaque is narrowing or blocking your heart's arteries. This procedure is also used to evaluate the valves, as well as measure the blood flow and oxygen levels in different parts of your heart. For further information please visit [email or web address]. Please follow instruction sheet, as given.   Follow-Up: Per Structural Heart Valve Team :1}    Other Instructions  [facility] OFFICE [place], [address] [place] [address] Dept: [phone] Loc: [place]  01/18/2021  You are scheduled for a Cardiac Catheterization on Friday, December 16 with Dr. Sherren Mocha.  1. Please arrive at the [facility] (Main Entrance A) at [facility]: [address] [place], [place] [address] at 10:00 AM (This time is two hours before your procedure to ensure your preparation). Free valet parking service is available.   Special note: Every effort is made to have your procedure done on time. Please understand that  emergencies sometimes delay scheduled procedures.  2. Diet: Do not eat solid foods after midnight.  The patient may have clear liquids until 5am upon the day of the procedure.  3. Labs: You will need to have blood drawn today. You do not need to be fasting.  4. Medication instructions in preparation for your procedure:   Contrast Allergy: No  DO NOT TAKE ANY COUMADIN AFTER  SAT 01/29/21   Do not take Diabetes Med Glucophage (Metformin) on the day of the procedure and HOLD 48 HOURS AFTER THE PROCEDURE.  On the morning of your procedure, take your Aspirin 81 mg and any morning medicines NOT listed above.  You may use sips of water.  5. Plan for one night stay--bring personal belongings. 6. Bring a current list of your medications and current insurance cards. 7. You MUST have a responsible person to drive you home. 8. Someone MUST be with you the first 24 hours after you arrive home or your discharge will be delayed. 9. Please wear clothes that are easy to get on and off and wear slip-on shoes.  Thank you for allowing [place] to care for you!   -- [facility]

## 2021-01-18 NOTE — H&P (View-Only) (Signed)
Cardiology Office Note:    Date:  01/19/2021   ID:  Anne, Wolf 09-08-1933, MRN [id number]  PCP:  Asencion Noble, MD   [place] Providers Cardiologist:  Carlyle Dolly, MD     Referring MD: Verta Ellen., NP   Chief Complaint  Patient presents with   Shortness of Breath     History of Present Illness:    Anne Wolf is a 87 y.o. female referred by Anne Dung, NP, for evaluation of aortic stenosis.   The patient is here with her husband today. She was diagnosed with atrial fibrillation many years ago, treated with warfarin without problems. She was transitioned to apixaban but had problems with epistaxis. She is now back on warfarin and doing well with that. She recently traveled to [place] to visit family. Soon after her flight landed, she developed acute shortness of breath. She was taken to the ER at [facility] and was treated with IV diuresis and was able to be discharged the following day.  An echocardiogram was done at [facility] and suggested the possibility of severe paradoxical low-flow low gradient aortic stenosis.  The patient has been followed for aortic stenosis in the past, with mild aortic stenosis dating back to echo studies in 2010.  Her most recent echocardiogram in our system demonstrates normal LV systolic function with an LVEF of 60 to 65%, normal RV function, severe biatrial dilatation, mild mitral valve stenosis with mean transmitral gradient of 10 mmHg, moderate to severe aortic valve stenosis and mild to moderate aortic valve insufficiency.  The mean transaortic valve gradient is 29 mmHg, maximal transaortic velocity is 3.5 m/s, and dimensionless index is 0.3.  The echo study from [place] showed a mean transmitral gradient of 6 mmHg, mild mitral regurgitation, and moderate to severe aortic stenosis with an averaged mean transaortic gradient of 27 mmHg, mild to moderate aortic insufficiency, stroke-volume  index of 30 mL/m, and dimensionless index of 0.2.  These findings were considered suggestive of severe low gradient aortic stenosis.  Since the patient's return from her trip to [place], she continues to have symptoms of exertional dyspnea and fatigue.  She is short of breath with any moderate level activity.  She has increasing fatigue.  She denies chest pain or pressure, edema, orthopnea, or PND.  She has lightheadedness at times with positional change.  No other complaints at today's visit.  Past Medical History:  Diagnosis Date   Aortic stenosis    mild; mild MR; slightly increased pulmonary artery pressure with mild RVH; normal LV-2010   Arthritis    Atrial fibrillation (HCC)    chronic anticoagulation; adequate HR control on minimal AV nodal blocking medication    Cancer ([place])    skin cancers removed from face   CHF (congestive heart failure) ([place])    Diabetes mellitus without complication ([place])    2   Dysrhythmia    A FIB   Fasting hyperglycemia    + microalbuminuria; A1c of 6.5% in 11/2010   Hematochezia 01/2009   01/2009-presumed ischemic colitis; history of diverticulosis   History of kidney stones    Hx of adenomatous colonic polyps 2005   adenomatous polyp; negative colonoscopy in 2009   Hyperlipidemia     Past Surgical History:  Procedure Laterality Date   CATARACT EXTRACTION W/PHACO Right 03/23/2014   Procedure: CATARACT EXTRACTION PHACO AND INTRAOCULAR LENS PLACEMENT ([place]);  Surgeon: Williams Che, MD;  Location: [facility];  Service: Ophthalmology;  Laterality: Right;  CDE:2.83   CATARACT EXTRACTION W/PHACO Left 07/27/2014   Procedure: CATARACT EXTRACTION PHACO AND INTRAOCULAR LENS PLACEMENT LEFT EYE CDE=6.97;  Surgeon: Williams Che, MD;  Location: [facility];  Service: Ophthalmology;  Laterality: Left;   COLONOSCOPY  2009   negative; prior study with adenomatous polyp   CYSTOSCOPY/URETEROSCOPY/HOLMIUM LASER/STENT PLACEMENT Right 08/21/2016   Procedure:  CYSTOSCOPY/URETEROSCOPY/RETROGRADE PYELOGRAM//STENT PLACEMENT;  Surgeon: Nickie Retort, MD;  Location: [facility];  Service: Urology;  Laterality: Right;   EYE SURGERY      Current Medications: Current Meds  Medication Sig   alendronate (FOSAMAX) 70 MG tablet Take 70 mg by mouth once a week.   calcium carbonate (OSCAL) 1500 (600 Ca) MG TABS tablet Take 600 mg of elemental calcium by mouth daily.   cholecalciferol (VITAMIN D) 1000 units tablet Take 1,000 Units by mouth daily.   Co-Enzyme Q-10 100 MG CAPS Take 1 capsule by mouth at bedtime.    lisinopril (PRINIVIL,ZESTRIL) 10 MG tablet Take 10 mg by mouth every evening.    loratadine (CLARITIN) 10 MG tablet Take 10 mg by mouth as needed.    metFORMIN (GLUCOPHAGE) 500 MG tablet Take 500 mg by mouth 2 (two) times daily with a meal.    metoprolol succinate (TOPROL-XL) 25 MG 24 hr tablet Take 25 mg by mouth daily.    Multiple Vitamins-Minerals (CENTRUM SILVER) tablet Take 1 tablet by mouth at bedtime.    simvastatin (ZOCOR) 40 MG tablet Take 40 mg by mouth at bedtime.     warfarin (COUMADIN) 2 MG tablet Take 1 tablet daily except 1/2 tablet on Wednesdays and Saturdays or as directed     Allergies:   Patient has no known allergies.   Social History   Socioeconomic History   Marital status: Married    Spouse name: Not on file   Number of children:  2   Years of education: Not on file   Highest education level: Not on file  Occupational History   Occupation: Retired  Tobacco Use   Smoking status: Never   Smokeless tobacco: Never  Vaping Use   Vaping Use: Never used  Substance and Sexual Activity   Alcohol use: No   Drug use: No   Sexual activity: Not Currently  Other Topics Concern   Not on file  Social History Narrative   Married with 2 children   No regular exercise   Social Determinants of Health   Financial Resource Strain: Not on file  Food Insecurity: Not on file  Transportation Needs: Not on file  Physical  Activity: Not on file  Stress: Not on file  Social Connections: Not on file     Family History: The patient's family history includes Arrhythmia in her sister; Cancer in her father.  ROS:   Please see the history of present illness.    Hand arthritis, fatigue.  All other systems reviewed and are negative.  EKGs/Labs/Other Studies Reviewed:    The following studies were reviewed today: 2D Echo 10/14/2020: 1. Small LV cavity (normal by index) and systolic function. Estimated LVEF 61% by MOD. Normal RV size and systolic function.  2. Moderate-severe AS with averaged mean PG 25mmHg (AVA 0.72cm^2) and mild-moderate AR. The SVI is 30 ml/m2, AVAI is 0.4 cm2/m2, DVI 0.21 all which suggest the posibility of low-gradient severe AS.  3. Mild MS with averaged mean PG 10mmHg (HR 85bpm) and mild MR. Mild-moderate TR. Estimated RVSP 44mmHg; RAP 71mmHg. Trace PR.  4. No prior studies for comparison.  Left Ventricle  The left ventricular cavity is small. There is mild concentric left ventricular hypertrophy. Diastolic function cannot be assessed.  Right Ventricle  The right ventricle is normal size. The right ventricular ejection fraction is normal.  Atria  The left atrium is severely dilated. The right atrium is moderately dilated.  Mitral Valve  The mitral valve leaflets appear moderately thickened. There is moderate mitral annular calcification. There is mild mitral stenosis. The mean gradient is 5.6 mmHg. There is mild mitral regurgitation  (1+).  Tricuspid Valve  The tricuspid valve leaflets are thin and pliable and the valve motion is normal. There is mild-moderate tricuspid regurgitation (1-2+).  Aortic Valve  The aortic valve is tri-leaflet. The aortic valve leaflets appear moderately thickened. There is moderate aortic stenosis. The mean gradient is 26.5 mmHg. The aortic valve area is calculated at 0.74  cm^2 based on the continuity equation. There is mild-moderate aortic regurgitation  (1-2+).  Pulmonic Valve  The pulmonic valve leaflets are thin and pliable; valve motion is normal. There is trace pulmonic valvular regurgitation.  Great Vessels  The aortic root is normal. The proximal ascending aorta is normal. The pulmonary artery is normal.  Pericardium/Pleural  No significant pericardial effusion.    MMode/2D Measurements & Calculations  IVSd: 1.2 cm   (0.6-1.1)                                            LVPWd: 1.2 cm   (0.6-1.1)                                 Ao root diam: 3.0 cm   (2.5-4.0)  RAs major: 6.0 cm   (3.5-5.5)                                       LVIDd: 3.5 cm   (3.7-5.7)                                 RAs minor: 5.0 cm   (2.5-4.9)  LVIDs: 2.3 cm                                                       FS: 34.0 %                                                RVDd minor: 3.5 cm  LVIDd index: 2.0                                                    IVC diam: 2.2 cm  asc Aorta Diam: 2.9 cm  LVOT diam: 2.0 cm                                         EF(MOD-sp4): 61.3 %                                                                      LVOT area: 3.2 cm2  LA vol: 91.1 ml   LA vol index: 51.6 ml/m2   Doppler Measurements & Calculations  MV V2 max: 196.6 cm/sec                                       Ao V2 max: 331.0 cm/sec                                      LV V1 max: 76.6 cm/sec  MV max PG: 15.5 mmHg                                          Ao max PG: 43.9 mmHg                                         LV V1 max PG at rest: 2.4 mmHg  MV V2 mean: 107.6 cm/sec                                      Ao mean PG: 26.5 mmHg                                        LV V1 VTI: 17.0 cm  MV mean PG: 5.6 mmHg                                          AVA(I,D): 0.72 cm2  MV V2 VTI: 40.2 cm  SV(LVOT): 54.3 ml                                             TR max vel: 280.1 cm/sec                                                                 TR max PG: 31.4 mmHg  RAP systole: 15.0 mmHg                                                                RVSP(TR): 46.4 mmHg   Echo 12/27/2020: 1. Left ventricular ejection fraction, by estimation, is 60 to 65%. The  left ventricle has normal function. The left ventricle has no regional  wall motion abnormalities. Left ventricular diastolic parameters are  indeterminate. Basal septal hypertrophy  noted.   2. Right ventricular systolic function is normal. The right ventricular  size is normal. There is mildly elevated pulmonary artery systolic  pressure.   3. Left atrial size was severely dilated.   4. Right atrial size was severely dilated.   5. Mitral Valve area by continuity equation 1.79 cm; this is in the  setting of aortic insuffiency. The mitral valve is degenerative. Mild  mitral valve regurgitation. Mild mitral stenosis. The mean mitral valve  gradient is 10.0 mmHg with average heart  rate of 96 bpm.   6. The aortic valve is calcified. Aortic valve regurgitation is mild to  moderate. Moderate to severe aortic valve stenosis. Aortic valve area, by  VTI measures 0.95 cm. Aortic valve mean gradient measures 29.0 mmHg.  Aortic valve Vmax measures 3.46 m/s.  Normal LV stroke volume with with DVI 0.3   7. The inferior vena cava is dilated in size with >50% respiratory  variability, suggesting right atrial pressure of 8 mmHg.   Comparison(s): A prior study was performed on 07/07/2020. Similar mitral  and aortic gradients.   FINDINGS   Left Ventricle: Basal septal hypertrophy noted. Left ventricular ejection  fraction, by estimation, is 60 to 65%. The left ventricle has normal  function. The left ventricle has no regional wall motion abnormalities.  The left ventricular internal cavity  size was normal in size. There is no left ventricular hypertrophy. Left  ventricular diastolic parameters are indeterminate.    Right Ventricle: The right ventricular size is normal. No increase in  right ventricular wall thickness. Right ventricular systolic function is  normal. There is mildly elevated pulmonary artery systolic pressure. The  tricuspid regurgitant velocity is 2.88   m/s, and with an assumed right atrial pressure of 8 mmHg, the estimated  right ventricular systolic pressure is 16.1 mmHg.   Left Atrium: Left atrial size was severely dilated.   Right Atrium: Right atrial size was severely dilated.   Pericardium: There is no evidence of pericardial effusion.   Mitral Valve: Mitral Valve area by continuity equation 1.79 cm; this is  in the setting of aortic insuffiency. The mitral valve is degenerative in  appearance. Mild mitral valve regurgitation. Mild mitral valve stenosis.  The mean mitral valve gradient is  10.0 mmHg with average heart rate of 96 bpm.   Tricuspid Valve: The tricuspid valve is normal in structure. Tricuspid  valve regurgitation is mild.   Aortic Valve: Normal LV stroke volume with with DVI 0.3. The aortic valve  is calcified. Aortic valve regurgitation is mild to moderate. Aortic  regurgitation PHT measures 320 msec. Moderate to severe aortic stenosis is  present. Aortic valve mean gradient  measures 29.0 mmHg. Aortic valve peak gradient measures 47.9 mmHg. Aortic  valve area, by VTI measures 0.95 cm.   Pulmonic Valve: The pulmonic valve was  not well visualized. Pulmonic valve  regurgitation is trivial. No evidence of pulmonic stenosis.   Aorta: The aortic root is normal in size and structure.   Venous: The inferior vena cava is dilated in size with greater than 50%  respiratory variability, suggesting right atrial pressure of 8 mmHg.   IAS/Shunts: The atrial septum is grossly normal.      LEFT VENTRICLE  PLAX 2D  LVIDd:         3.90 cm  LVIDs:         2.60 cm  LV PW:         1.00 cm  LV IVS:        1.40 cm  LVOT diam:     2.00 cm  LV SV:         75  LV  SV Index:   43  LVOT Area:     3.14 cm      RIGHT VENTRICLE  RV S prime:     10.80 cm/s  TAPSE (M-mode): 1.7 cm   LEFT ATRIUM              Index        RIGHT ATRIUM           Index  LA diam:        4.20 cm  2.38 cm/m   RA Area:     24.80 cm  LA Vol (A2C):   105.0 ml 59.42 ml/m  RA Volume:   80.10 ml  45.33 ml/m  LA Vol (A4C):   69.2 ml  39.16 ml/m  LA Biplane Vol: 90.1 ml  50.99 ml/m   AORTIC VALVE  AV Area (Vmax):    0.98 cm  AV Area (Vmean):   1.03 cm  AV Area (VTI):     0.95 cm  AV Vmax:           346.00 cm/s  AV Vmean:          243.500 cm/s  AV VTI:            0.791 m  AV Peak Grad:      47.9 mmHg  AV Mean Grad:      29.0 mmHg  LVOT Vmax:         108.00 cm/s  LVOT Vmean:        79.600 cm/s  LVOT VTI:          0.240 m  LVOT/AV VTI ratio: 0.30  AI PHT:            320 msec     AORTA  Ao Root diam: 3.20 cm   MITRAL VALVE                TRICUSPID VALVE  MV Area (PHT): 2.22 cm     TR Peak grad:   33.2 mmHg  MV Mean grad:  10.0 mmHg    TR Vmax:        288.00 cm/s  MV Decel Time: 342 msec  MV E velocity: 201.00 cm/s  SHUNTS                              Systemic VTI:  0.24 m                              Systemic Diam: 2.00 cm   EKG:  EKG is not ordered today.  Recent Labs: 01/18/2021: BUN 10; Creatinine, Ser 0.81; Hemoglobin 13.0; Platelets 197; Potassium 4.6; Sodium 144  Recent Lipid Panel    Component Value Date/Time   CHOL 182 03/24/2008 0000   TRIG 127 03/24/2008 0000   HDL 49 03/24/2008 0000   LDLCALC 108 03/24/2008 0000   LDLCALC 108 03/24/2008 0000     Risk Assessment/Calculations:    CHA2DS2-VASc Score = 6   This indicates a 9.7% annual risk of stroke. The patient's score is based upon: CHF History: 1 HTN History: 1 Diabetes History: 1 Stroke History: 0 Vascular Disease History: 0 Age Score: 2 Gender Score: 1       STS RISK CALCULATOR: Risk of Mortality:  2.604%  Renal Failure:  1.588%  Permanent Stroke:  2.511%  Prolonged  Ventilation:  6.189%  DSW Infection:  0.050%  Reoperation:  3.120%  Morbidity or Mortality:  11.101%  Short Length of Stay:  26.355%  Long Length of Stay:  5.569%   Physical Exam:    VS:  BP 126/82    Pulse 87    Ht 5\' 2"  (1.575 m)    Wt 152 lb 9.6 oz (69.2 kg)    SpO2 98%    BMI 27.91 kg/m     Wt Readings from Last 3 Encounters:  01/18/21 152 lb 9.6 oz (69.2 kg)  12/30/20 152 lb (68.9 kg)  12/22/19 166 lb 3.2 oz (75.4 kg)     GEN: Well nourished, well developed early woman in no acute distress HEENT: Normal NECK: No JVD; No carotid bruits LYMPHATICS: No lymphadenopathy CARDIAC: Irregularly irregular, 3/6 harsh mid peaking crescendo decrescendo murmur in at the right upper sternal border, A2 intact RESPIRATORY:  Clear to auscultation without rales, wheezing or rhonchi  ABDOMEN: Soft, non-tender, non-distended MUSCULOSKELETAL:  No edema; No deformity  SKIN: Warm and dry NEUROLOGIC:  Alert and oriented x 3 PSYCHIATRIC:  Normal affect   ASSESSMENT:    1. Nonrheumatic aortic valve stenosis   2. Pre-procedure lab exam    PLAN:    In order of problems listed above:  The patient has echo findings and symptoms concerning for severe, paradoxical, low-flow low gradient aortic stenosis (D3 aortic stenosis).  She describes New York Heart Association functional class II symptoms of chronic diastolic heart failure with exertional dyspnea and fatigue.  I have reviewed her echo images from our system and they demonstrate preserved LV and RV function.  The aortic valve leaflets are calcified and at least moderately restricted.  Doppler data suggests moderate aortic stenosis based on gradients and stroke-volume calculations.  However, the echocardiogram performed at [facility] showed a low aortic valve dimensionless index of 0.21, low stroke-volume index less than 35, and raise suspicion for severe paradoxical low-flow low gradient aortic stenosis.  I reviewed the natural history of aortic  stenosis with the patient and her husband today.  We discussed potential treatment options, including transcatheter aortic valve replacement, surgical aortic valve replacement, and palliative medical therapy.  They understand it is important to accurately define the severity of her aortic stenosis with further testing.  In the setting of the patient's progressive symptoms, I think she should be further evaluated to accurately define the severity of her aortic stenosis and evaluate for other potential etiologies of worsening shortness of breath and fatigue.  I have recommended right and left heart catheterization to perform full hemodynamic assessment of her aortic stenosis and assess for obstructive coronary artery disease. I have reviewed the risks, indications, and alternatives to cardiac catheterization,  possible angioplasty, and stenting with the patient. Risks include but are not limited to bleeding, infection, vascular injury, stroke, myocardial infection, arrhythmia, kidney injury, radiation-related injury in the case of prolonged fluoroscopy use, emergency cardiac surgery, and death. The patient understands the risks of serious complication is 1-2 in 2297 with diagnostic cardiac cath and 1-2% or less with angioplasty/stenting.  I have also recommended CT angiography studies to assess for anatomic feasibility of TAVR and assessment of aortic valve calcium score as another potential marker of severe aortic stenosis.  Depending on the results of these findings, the patient will undergo further multidisciplinary heart team evaluation for TAVR if indicated.  If she is found to have moderate aortic stenosis, the Progress Trial would be a reasonable consideration in light of her atrial fibrillation.  I will follow-up with her once her studies are completed.  Shared Decision Making/Informed Consent The risks [stroke (1 in 1000), death (1 in 1000), kidney failure [usually temporary] (1 in 500), bleeding (1 in  200), allergic reaction [possibly serious] (1 in 200)], benefits (diagnostic support and management of coronary artery disease) and alternatives of a cardiac catheterization were discussed in detail with Ms. Antu and she is willing to proceed.    Medication Adjustments/Labs and Tests Ordered: Current medicines are reviewed at length with the patient today.  Concerns regarding medicines are outlined above.  Orders Placed This Encounter  Procedures   CT ANGIO ABDOMEN PELVIS  W &/OR WO CONTRAST   CT CORONARY MORPH W/CTA COR W/SCORE W/CA W/CM &/OR WO/CM   CT ANGIO CHEST AORTA W/CM & OR WO/CM   Basic Metabolic Panel (BMET)   CBC   EKG 12-Lead    No orders of the defined types were placed in this encounter.   Patient Instructions  Medication Instructions:  No changes *If you need a refill on your cardiac medications before your next appointment, please call your pharmacy*   Lab Work: Today: BMET, CBC If you have labs (blood work) drawn today and your tests are completely normal, you will receive your results only by: [place] (if you have MyChart) OR A paper copy in the mail If you have any lab test that is abnormal or we need to change your treatment, we will call you to review the results.   Testing/Procedures: Your physician has requested that you have a cardiac catheterization. Cardiac catheterization is used to diagnose and/or treat various heart conditions. Doctors may recommend this procedure for a number of different reasons. The most common reason is to evaluate chest pain. Chest pain can be a symptom of coronary artery disease (CAD), and cardiac catheterization can show whether plaque is narrowing or blocking your hearts arteries. This procedure is also used to evaluate the valves, as well as measure the blood flow and oxygen levels in different parts of your heart. For further information please visit [email or web address]. Please follow instruction sheet, as  given.   Follow-Up: Per Structural Heart Valve Team :1}    Other Instructions  [facility] [place], [address] [place] [address] Dept: [phone] Loc: [place]  01/18/2021  You are scheduled for a Cardiac Catheterization on Friday, December 16 with Dr. Sherren Mocha.  1. Please arrive at the [facility] (Main Entrance A) at [facility]: [address] [place], [place] [address] at 10:00 AM (This time is two hours before your procedure to ensure your preparation). Free valet  parking service is available.   Special note: Every effort is made to have your procedure done on time. Please understand that emergencies sometimes delay scheduled procedures.  2. Diet: Do not eat solid foods after midnight.  The patient may have clear liquids until 5am upon the day of the procedure.  3. Labs: You will need to have blood drawn today. You do not need to be fasting.  4. Medication instructions in preparation for your procedure:   Contrast Allergy: No  DO NOT TAKE ANY COUMADIN AFTER  SAT 01/29/21   Do not take Diabetes Med Glucophage (Metformin) on the day of the procedure and HOLD 48 HOURS AFTER THE PROCEDURE.  On the morning of your procedure, take your Aspirin 81 mg and any morning medicines NOT listed above.  You may use sips of water.  5. Plan for one night stay--bring personal belongings. 6. Bring a current list of your medications and current insurance cards. 7. You MUST have a responsible person to drive you home. 8. Someone MUST be with you the first 24 hours after you arrive home or your discharge will be delayed. 9. Please wear clothes that are easy to get on and off and wear slip-on shoes.  Thank you for allowing [place] to care for you!   -- [facility]     Signed, Sherren Mocha, MD  01/19/2021 12:31 PM     [place]

## 2021-01-18 NOTE — Progress Notes (Signed)
Pre Surgical Assessment: 5 M Walk Test  26M=16.49ft  5 Meter Walk Test- trial 1: 5.59 seconds 5 Meter Walk Test- trial 2: 5.49 seconds 5 Meter Walk Test- trial 3: 5.18 seconds 5 Meter Walk Test Average: 5.42 seconds

## 2021-01-18 NOTE — Progress Notes (Signed)
Cardiology Office Note:    Date:  01/19/2021   ID:  [name], [name] 1933-07-22, MRN [id number]  PCP:  Asencion Noble, MD   [facility] Cardiologist:  Carlyle Dolly, MD     Referring MD: Verta Ellen., NP   Chief Complaint  Patient presents with   Shortness of Breath     History of Present Illness:    Anne Wolf is a 87 y.o. female referred by Katina Dung, NP, for evaluation of aortic stenosis.   The patient is here with her husband today. She was diagnosed with atrial fibrillation many years ago, treated with warfarin without problems. She was transitioned to apixaban but had problems with epistaxis. She is now back on warfarin and doing well with that. She recently traveled to [place] to visit family. Soon after her flight landed, she developed acute shortness of breath. She was taken to the ER at [facility] and was treated with IV diuresis and was able to be discharged the following day.  An echocardiogram was done at [facility] and suggested the possibility of severe paradoxical low-flow low gradient aortic stenosis.  The patient has been followed for aortic stenosis in the past, with mild aortic stenosis dating back to echo studies in 2010.  Her most recent echocardiogram in our system demonstrates normal LV systolic function with an LVEF of 60 to 65%, normal RV function, severe biatrial dilatation, mild mitral valve stenosis with mean transmitral gradient of 10 mmHg, moderate to severe aortic valve stenosis and mild to moderate aortic valve insufficiency.  The mean transaortic valve gradient is 29 mmHg, maximal transaortic velocity is 3.5 m/s, and dimensionless index is 0.3.  The echo study from [place] showed a mean transmitral gradient of 6 mmHg, mild mitral regurgitation, and moderate to severe aortic stenosis with an averaged mean transaortic gradient of 27 mmHg, mild to moderate aortic insufficiency, stroke-volume  index of 30 mL/m, and dimensionless index of 0.2.  These findings were considered suggestive of severe low gradient aortic stenosis.  Since the patient's return from her trip to [place], she continues to have symptoms of exertional dyspnea and fatigue.  She is short of breath with any moderate level activity.  She has increasing fatigue.  She denies chest pain or pressure, edema, orthopnea, or PND.  She has lightheadedness at times with positional change.  No other complaints at today's visit.  Past Medical History:  Diagnosis Date   Aortic stenosis    mild; mild MR; slightly increased pulmonary artery pressure with mild RVH; normal LV-2010   Arthritis    Atrial fibrillation (HCC)    chronic anticoagulation; adequate HR control on minimal AV nodal blocking medication    Cancer ([place])    skin cancers removed from face   CHF (congestive heart failure) ([place])    Diabetes mellitus without complication ([place])    2   Dysrhythmia    A FIB   Fasting hyperglycemia    + microalbuminuria; A1c of 6.5% in 11/2010   Hematochezia 01/2009   01/2009-presumed ischemic colitis; history of diverticulosis   History of kidney stones    Hx of adenomatous colonic polyps 2005   adenomatous polyp; negative colonoscopy in 2009   Hyperlipidemia     Past Surgical History:  Procedure Laterality Date   CATARACT EXTRACTION W/PHACO Right 03/23/2014   Procedure: CATARACT EXTRACTION PHACO AND INTRAOCULAR LENS PLACEMENT ([place]);  Surgeon: Williams Che, MD;  Location: [facility];  Service: Ophthalmology;  Laterality: Right;  CDE:2.83   CATARACT EXTRACTION W/PHACO Left 07/27/2014   Procedure: CATARACT EXTRACTION PHACO AND INTRAOCULAR LENS PLACEMENT LEFT EYE CDE=6.97;  Surgeon: Williams Che, MD;  Location: [facility];  Service: Ophthalmology;  Laterality: Left;   COLONOSCOPY  2009   negative; prior study with adenomatous polyp   CYSTOSCOPY/URETEROSCOPY/HOLMIUM LASER/STENT PLACEMENT Right 08/21/2016   Procedure:  CYSTOSCOPY/URETEROSCOPY/RETROGRADE PYELOGRAM//STENT PLACEMENT;  Surgeon: Nickie Retort, MD;  Location: [facility];  Service: Urology;  Laterality: Right;   EYE SURGERY      Current Medications: Current Meds  Medication Sig   alendronate (FOSAMAX) 70 MG tablet Take 70 mg by mouth once a week.   calcium carbonate (OSCAL) 1500 (600 Ca) MG TABS tablet Take 600 mg of elemental calcium by mouth daily.   cholecalciferol (VITAMIN D) 1000 units tablet Take 1,000 Units by mouth daily.   Co-Enzyme Q-10 100 MG CAPS Take 1 capsule by mouth at bedtime.    lisinopril (PRINIVIL,ZESTRIL) 10 MG tablet Take 10 mg by mouth every evening.    loratadine (CLARITIN) 10 MG tablet Take 10 mg by mouth as needed.    metFORMIN (GLUCOPHAGE) 500 MG tablet Take 500 mg by mouth 2 (two) times daily with a meal.    metoprolol succinate (TOPROL-XL) 25 MG 24 hr tablet Take 25 mg by mouth daily.    Multiple Vitamins-Minerals (CENTRUM SILVER) tablet Take 1 tablet by mouth at bedtime.    simvastatin (ZOCOR) 40 MG tablet Take 40 mg by mouth at bedtime.     warfarin (COUMADIN) 2 MG tablet Take 1 tablet daily except 1/2 tablet on Wednesdays and Saturdays or as directed     Allergies:   Patient has no known allergies.   Social History   Socioeconomic History   Marital status: Married    Spouse name: Not on file   Number of children:  2   Years of education: Not on file   Highest education level: Not on file  Occupational History   Occupation: Retired  Tobacco Use   Smoking status: Never   Smokeless tobacco: Never  Vaping Use   Vaping Use: Never used  Substance and Sexual Activity   Alcohol use: No   Drug use: No   Sexual activity: Not Currently  Other Topics Concern   Not on file  Social History Narrative   Married with 2 children   No regular exercise   Social Determinants of Health   Financial Resource Strain: Not on file  Food Insecurity: Not on file  Transportation Needs: Not on file  Physical  Activity: Not on file  Stress: Not on file  Social Connections: Not on file     Family History: The patient's family history includes Arrhythmia in her sister; Cancer in her father.  ROS:   Please see the history of present illness.    Hand arthritis, fatigue.  All other systems reviewed and are negative.  EKGs/Labs/Other Studies Reviewed:    The following studies were reviewed today: 2D Echo 10/14/2020: 1. Small LV cavity (normal by index) and systolic function. Estimated LVEF 61% by MOD. Normal RV size and systolic function.  2. Moderate-severe AS with averaged mean PG 102mmHg (AVA 0.72cm^2) and mild-moderate AR. The SVI is 30 ml/m2, AVAI is 0.4 cm2/m2, DVI 0.21 all which suggest the posibility of low-gradient severe AS.  3. Mild MS with averaged mean PG 53mmHg (HR 85bpm) and mild MR. Mild-moderate TR. Estimated RVSP 81mmHg; RAP 48mmHg. Trace PR.  4. No prior studies for comparison.  Left Ventricle  The left ventricular cavity is small. There is mild concentric left ventricular hypertrophy. Diastolic function cannot be assessed.  Right Ventricle  The right ventricle is normal size. The right ventricular ejection fraction is normal.  Atria  The left atrium is severely dilated. The right atrium is moderately dilated.  Mitral Valve  The mitral valve leaflets appear moderately thickened. There is moderate mitral annular calcification. There is mild mitral stenosis. The mean gradient is 5.6 mmHg. There is mild mitral regurgitation  (1+).  Tricuspid Valve  The tricuspid valve leaflets are thin and pliable and the valve motion is normal. There is mild-moderate tricuspid regurgitation (1-2+).  Aortic Valve  The aortic valve is tri-leaflet. The aortic valve leaflets appear moderately thickened. There is moderate aortic stenosis. The mean gradient is 26.5 mmHg. The aortic valve area is calculated at 0.74  cm^2 based on the continuity equation. There is mild-moderate aortic regurgitation  (1-2+).  Pulmonic Valve  The pulmonic valve leaflets are thin and pliable; valve motion is normal. There is trace pulmonic valvular regurgitation.  Great Vessels  The aortic root is normal. The proximal ascending aorta is normal. The pulmonary artery is normal.  Pericardium/Pleural  No significant pericardial effusion.    MMode/2D Measurements & Calculations  IVSd: 1.2 cm   (0.6-1.1)                                            LVPWd: 1.2 cm   (0.6-1.1)                                 Ao root diam: 3.0 cm   (2.5-4.0)  RAs major: 6.0 cm   (3.5-5.5)                                       LVIDd: 3.5 cm   (3.7-5.7)                                 RAs minor: 5.0 cm   (2.5-4.9)  LVIDs: 2.3 cm                                                       FS: 34.0 %                                                RVDd minor: 3.5 cm  LVIDd index: 2.0                                                    IVC diam: 2.2 cm  asc Aorta Diam: 2.9 cm  LVOT diam: 2.0 cm                                         EF(MOD-sp4): 61.3 %                                                                      LVOT area: 3.2 cm2  LA vol: 91.1 ml   LA vol index: 51.6 ml/m2   Doppler Measurements & Calculations  MV V2 max: 196.6 cm/sec                                       Ao V2 max: 331.0 cm/sec                                      LV V1 max: 76.6 cm/sec  MV max PG: 15.5 mmHg                                          Ao max PG: 43.9 mmHg                                         LV V1 max PG at rest: 2.4 mmHg  MV V2 mean: 107.6 cm/sec                                      Ao mean PG: 26.5 mmHg                                        LV V1 VTI: 17.0 cm  MV mean PG: 5.6 mmHg                                          AVA(I,D): 0.72 cm2  MV V2 VTI: 40.2 cm  SV(LVOT): 54.3 ml                                             TR max vel: 280.1 cm/sec                                                                 TR max PG: 31.4 mmHg  RAP systole: 15.0 mmHg                                                                RVSP(TR): 46.4 mmHg   Echo 12/27/2020: 1. Left ventricular ejection fraction, by estimation, is 60 to 65%. The  left ventricle has normal function. The left ventricle has no regional  wall motion abnormalities. Left ventricular diastolic parameters are  indeterminate. Basal septal hypertrophy  noted.   2. Right ventricular systolic function is normal. The right ventricular  size is normal. There is mildly elevated pulmonary artery systolic  pressure.   3. Left atrial size was severely dilated.   4. Right atrial size was severely dilated.   5. Mitral Valve area by continuity equation 1.79 cm; this is in the  setting of aortic insuffiency. The mitral valve is degenerative. Mild  mitral valve regurgitation. Mild mitral stenosis. The mean mitral valve  gradient is 10.0 mmHg with average heart  rate of 96 bpm.   6. The aortic valve is calcified. Aortic valve regurgitation is mild to  moderate. Moderate to severe aortic valve stenosis. Aortic valve area, by  VTI measures 0.95 cm. Aortic valve mean gradient measures 29.0 mmHg.  Aortic valve Vmax measures 3.46 m/s.  Normal LV stroke volume with with DVI 0.3   7. The inferior vena cava is dilated in size with >50% respiratory  variability, suggesting right atrial pressure of 8 mmHg.   Comparison(s): A prior study was performed on 07/07/2020. Similar mitral  and aortic gradients.   FINDINGS   Left Ventricle: Basal septal hypertrophy noted. Left ventricular ejection  fraction, by estimation, is 60 to 65%. The left ventricle has normal  function. The left ventricle has no regional wall motion abnormalities.  The left ventricular internal cavity  size was normal in size. There is no left ventricular hypertrophy. Left  ventricular diastolic parameters are indeterminate.    Right Ventricle: The right ventricular size is normal. No increase in  right ventricular wall thickness. Right ventricular systolic function is  normal. There is mildly elevated pulmonary artery systolic pressure. The  tricuspid regurgitant velocity is 2.88   m/s, and with an assumed right atrial pressure of 8 mmHg, the estimated  right ventricular systolic pressure is 94.7 mmHg.   Left Atrium: Left atrial size was severely dilated.   Right Atrium: Right atrial size was severely dilated.   Pericardium: There is no evidence of pericardial effusion.   Mitral Valve: Mitral Valve area by continuity equation 1.79 cm; this is  in the setting of aortic insuffiency. The mitral valve is degenerative in  appearance. Mild mitral valve regurgitation. Mild mitral valve stenosis.  The mean mitral valve gradient is  10.0 mmHg with average heart rate of 96 bpm.   Tricuspid Valve: The tricuspid valve is normal in structure. Tricuspid  valve regurgitation is mild.   Aortic Valve: Normal LV stroke volume with with DVI 0.3. The aortic valve  is calcified. Aortic valve regurgitation is mild to moderate. Aortic  regurgitation PHT measures 320 msec. Moderate to severe aortic stenosis is  present. Aortic valve mean gradient  measures 29.0 mmHg. Aortic valve peak gradient measures 47.9 mmHg. Aortic  valve area, by VTI measures 0.95 cm.   Pulmonic Valve: The pulmonic valve was  not well visualized. Pulmonic valve  regurgitation is trivial. No evidence of pulmonic stenosis.   Aorta: The aortic root is normal in size and structure.   Venous: The inferior vena cava is dilated in size with greater than 50%  respiratory variability, suggesting right atrial pressure of 8 mmHg.   IAS/Shunts: The atrial septum is grossly normal.      LEFT VENTRICLE  PLAX 2D  LVIDd:         3.90 cm  LVIDs:         2.60 cm  LV PW:         1.00 cm  LV IVS:        1.40 cm  LVOT diam:     2.00 cm  LV SV:         75  LV  SV Index:   43  LVOT Area:     3.14 cm      RIGHT VENTRICLE  RV S prime:     10.80 cm/s  TAPSE (M-mode): 1.7 cm   LEFT ATRIUM              Index        RIGHT ATRIUM           Index  LA diam:        4.20 cm  2.38 cm/m   RA Area:     24.80 cm  LA Vol (A2C):   105.0 ml 59.42 ml/m  RA Volume:   80.10 ml  45.33 ml/m  LA Vol (A4C):   69.2 ml  39.16 ml/m  LA Biplane Vol: 90.1 ml  50.99 ml/m   AORTIC VALVE  AV Area (Vmax):    0.98 cm  AV Area (Vmean):   1.03 cm  AV Area (VTI):     0.95 cm  AV Vmax:           346.00 cm/s  AV Vmean:          243.500 cm/s  AV VTI:            0.791 m  AV Peak Grad:      47.9 mmHg  AV Mean Grad:      29.0 mmHg  LVOT Vmax:         108.00 cm/s  LVOT Vmean:        79.600 cm/s  LVOT VTI:          0.240 m  LVOT/AV VTI ratio: 0.30  AI PHT:            320 msec     AORTA  Ao Root diam: 3.20 cm   MITRAL VALVE                TRICUSPID VALVE  MV Area (PHT): 2.22 cm     TR Peak grad:   33.2 mmHg  MV Mean grad:  10.0 mmHg    TR Vmax:        288.00 cm/s  MV Decel Time: 342 msec  MV E velocity: 201.00 cm/s  SHUNTS                              Systemic VTI:  0.24 m                              Systemic Diam: 2.00 cm   EKG:  EKG is not ordered today.  Recent Labs: 01/18/2021: BUN 10; Creatinine, Ser 0.81; Hemoglobin 13.0; Platelets 197; Potassium 4.6; Sodium 144  Recent Lipid Panel    Component Value Date/Time   CHOL 182 03/24/2008 0000   TRIG 127 03/24/2008 0000   HDL 49 03/24/2008 0000   LDLCALC 108 03/24/2008 0000   LDLCALC 108 03/24/2008 0000     Risk Assessment/Calculations:    CHA2DS2-VASc Score = 6   This indicates a 9.7% annual risk of stroke. The patient's score is based upon: CHF History: 1 HTN History: 1 Diabetes History: 1 Stroke History: 0 Vascular Disease History: 0 Age Score: 2 Gender Score: 1       STS RISK CALCULATOR: Risk of Mortality:  2.604%  Renal Failure:  1.588%  Permanent Stroke:  2.511%  Prolonged  Ventilation:  6.189%  DSW Infection:  0.050%  Reoperation:  3.120%  Morbidity or Mortality:  11.101%  Short Length of Stay:  26.355%  Long Length of Stay:  5.569%   Physical Exam:    VS:  BP 126/82   Pulse 87   Ht 5\' 2"  (1.575 m)   Wt 152 lb 9.6 oz (69.2 kg)   SpO2 98%   BMI 27.91 kg/m     Wt Readings from Last 3 Encounters:  01/18/21 152 lb 9.6 oz (69.2 kg)  12/30/20 152 lb (68.9 kg)  12/22/19 166 lb 3.2 oz (75.4 kg)     GEN: Well nourished, well developed early woman in no acute distress HEENT: Normal NECK: No JVD; No carotid bruits LYMPHATICS: No lymphadenopathy CARDIAC: Irregularly irregular, 3/6 harsh mid peaking crescendo decrescendo murmur in at the right upper sternal border, A2 intact RESPIRATORY:  Clear to auscultation without rales, wheezing or rhonchi  ABDOMEN: Soft, non-tender, non-distended MUSCULOSKELETAL:  No edema; No deformity  SKIN: Warm and dry NEUROLOGIC:  Alert and oriented x 3 PSYCHIATRIC:  Normal affect   ASSESSMENT:    1. Nonrheumatic aortic valve stenosis   2. Pre-procedure lab exam    PLAN:    In order of problems listed above:  The patient has echo findings and symptoms concerning for severe, paradoxical, low-flow low gradient aortic stenosis (D3 aortic stenosis).  She describes New York Heart Association functional class II symptoms of chronic diastolic heart failure with exertional dyspnea and fatigue.  I have reviewed her echo images from our system and they demonstrate preserved LV and RV function.  The aortic valve leaflets are calcified and at least moderately restricted.  Doppler data suggests moderate aortic stenosis based on gradients and stroke-volume calculations.  However, the echocardiogram performed at [facility] showed a low aortic valve dimensionless index of 0.21, low stroke-volume index less than 35, and raise suspicion for severe paradoxical low-flow low gradient aortic stenosis.  I reviewed the natural history of aortic  stenosis with the patient and her husband today.  We discussed potential treatment options, including transcatheter aortic valve replacement, surgical aortic valve replacement, and palliative medical therapy.  They understand it is important to accurately define the severity of her aortic stenosis with further testing.  In the setting of the patient's progressive symptoms, I think she should be further evaluated to accurately define the severity of her aortic stenosis and evaluate for other potential etiologies of worsening shortness of breath and fatigue.  I have recommended right and left heart catheterization to perform full hemodynamic assessment of her aortic stenosis and assess for obstructive coronary artery disease. I have reviewed the risks, indications, and alternatives to cardiac catheterization, possible angioplasty, and stenting with  the patient. Risks include but are not limited to bleeding, infection, vascular injury, stroke, myocardial infection, arrhythmia, kidney injury, radiation-related injury in the case of prolonged fluoroscopy use, emergency cardiac surgery, and death. The patient understands the risks of serious complication is 1-2 in 1610 with diagnostic cardiac cath and 1-2% or less with angioplasty/stenting.  I have also recommended CT angiography studies to assess for anatomic feasibility of TAVR and assessment of aortic valve calcium score as another potential marker of severe aortic stenosis.  Depending on the results of these findings, the patient will undergo further multidisciplinary heart team evaluation for TAVR if indicated.  If she is found to have moderate aortic stenosis, the Progress Trial would be a reasonable consideration in light of her atrial fibrillation.  I will follow-up with her once her studies are completed.  Shared Decision Making/Informed Consent The risks [stroke (1 in 1000), death (1 in 1000), kidney failure [usually temporary] (1 in 500), bleeding (1 in  200), allergic reaction [possibly serious] (1 in 200)], benefits (diagnostic support and management of coronary artery disease) and alternatives of a cardiac catheterization were discussed in detail with Anne Wolf and she is willing to proceed.    Medication Adjustments/Labs and Tests Ordered: Current medicines are reviewed at length with the patient today.  Concerns regarding medicines are outlined above.  Orders Placed This Encounter  Procedures   CT ANGIO ABDOMEN PELVIS  W &/OR WO CONTRAST   CT CORONARY MORPH W/CTA COR W/SCORE W/CA W/CM &/OR WO/CM   CT ANGIO CHEST AORTA W/CM & OR WO/CM   Basic Metabolic Panel (BMET)   CBC   EKG 12-Lead    No orders of the defined types were placed in this encounter.   Patient Instructions  Medication Instructions:  No changes *If you need a refill on your cardiac medications before your next appointment, please call your pharmacy*   Lab Work: Today: BMET, CBC If you have labs (blood work) drawn today and your tests are completely normal, you will receive your results only by: [place] (if you have MyChart) OR A paper copy in the mail If you have any lab test that is abnormal or we need to change your treatment, we will call you to review the results.   Testing/Procedures: Your physician has requested that you have a cardiac catheterization. Cardiac catheterization is used to diagnose and/or treat various heart conditions. Doctors may recommend this procedure for a number of different reasons. The most common reason is to evaluate chest pain. Chest pain can be a symptom of coronary artery disease (CAD), and cardiac catheterization can show whether plaque is narrowing or blocking your heart's arteries. This procedure is also used to evaluate the valves, as well as measure the blood flow and oxygen levels in different parts of your heart. For further information please visit [email or web address]. Please follow instruction sheet, as  given.   Follow-Up: Per Structural Heart Valve Team :1}    Other Instructions  [facility] OFFICE [place], [address] [place] [address] Dept: [phone] Loc: [place]  01/18/2021  You are scheduled for a Cardiac Catheterization on Friday, December 16 with Dr. Sherren Mocha.  1. Please arrive at the [facility] (Main Entrance A) at [facility]: [address] [place], [place] [address] at 10:00 AM (This time is two hours before your procedure to ensure your preparation). Free valet parking service is available.  Special note: Every effort is made to have your procedure done on time. Please understand that emergencies sometimes delay scheduled procedures.  2. Diet: Do not eat solid foods after midnight.  The patient may have clear liquids until 5am upon the day of the procedure.  3. Labs: You will need to have blood drawn today. You do not need to be fasting.  4. Medication instructions in preparation for your procedure:   Contrast Allergy: No  DO NOT TAKE ANY COUMADIN AFTER  SAT 01/29/21   Do not take Diabetes Med Glucophage (Metformin) on the day of the procedure and HOLD 48 HOURS AFTER THE PROCEDURE.  On the morning of your procedure, take your Aspirin 81 mg and any morning medicines NOT listed above.  You may use sips of water.  5. Plan for one night stay--bring personal belongings. 6. Bring a current list of your medications and current insurance cards. 7. You MUST have a responsible person to drive you home. 8. Someone MUST be with you the first 24 hours after you arrive home or your discharge will be delayed. 9. Please wear clothes that are easy to get on and off and wear slip-on shoes.  Thank you for allowing [place] to care for you!   -- [facility] Invasive Cardiovascular services     Signed, Sherren Mocha, MD  01/19/2021 12:31 PM     [place]

## 2021-01-19 LAB — BASIC METABOLIC PANEL
BUN/Creatinine Ratio: 12 (ref 12–28)
BUN: 10 mg/dL (ref 8–27)
CO2: 24 mmol/L (ref 20–29)
Calcium: 9.9 mg/dL (ref 8.7–10.3)
Chloride: 104 mmol/L (ref 96–106)
Creatinine, Ser: 0.81 mg/dL (ref 0.57–1.00)
Glucose: 90 mg/dL (ref 70–99)
Potassium: 4.6 mmol/L (ref 3.5–5.2)
Sodium: 144 mmol/L (ref 134–144)
eGFR: 71 mL/min/{1.73_m2} (ref >59)

## 2021-01-19 LAB — CBC
Hematocrit: 39.8 % (ref 34.0–46.6)
Hemoglobin: 13 g/dL (ref 11.1–15.9)
MCH: 29.3 pg (ref 26.6–33.0)
MCHC: 32.7 g/dL (ref 31.5–35.7)
MCV: 90 fL (ref 79–97)
Platelets: 197 10*3/uL (ref 150–450)
RBC: 4.44 x10E6/uL (ref 3.77–5.28)
RDW: 12.7 % (ref 11.7–15.4)
WBC: 8.8 10*3/uL (ref 3.4–10.8)

## 2021-01-25 ENCOUNTER — Encounter: Payer: Self-pay | Admitting: Cardiology

## 2021-01-25 DIAGNOSIS — I350 Nonrheumatic aortic (valve) stenosis: Secondary | ICD-10-CM | POA: Diagnosis not present

## 2021-01-25 DIAGNOSIS — I4821 Permanent atrial fibrillation: Secondary | ICD-10-CM | POA: Diagnosis not present

## 2021-01-25 DIAGNOSIS — Z79899 Other long term (current) drug therapy: Secondary | ICD-10-CM | POA: Diagnosis not present

## 2021-01-25 DIAGNOSIS — E1129 Type 2 diabetes mellitus with other diabetic kidney complication: Secondary | ICD-10-CM | POA: Diagnosis not present

## 2021-01-27 ENCOUNTER — Other Ambulatory Visit: Payer: Self-pay | Admitting: Physician Assistant

## 2021-01-27 ENCOUNTER — Encounter: Payer: Self-pay | Admitting: Physician Assistant

## 2021-01-27 NOTE — Progress Notes (Signed)
Letter for Pre CT instructions

## 2021-01-28 ENCOUNTER — Telehealth: Payer: Self-pay | Admitting: Physician Assistant

## 2021-01-28 NOTE — Telephone Encounter (Signed)
Entered in error

## 2021-02-01 DIAGNOSIS — E1122 Type 2 diabetes mellitus with diabetic chronic kidney disease: Secondary | ICD-10-CM | POA: Diagnosis not present

## 2021-02-01 DIAGNOSIS — I480 Paroxysmal atrial fibrillation: Secondary | ICD-10-CM | POA: Diagnosis not present

## 2021-02-01 DIAGNOSIS — D6869 Other thrombophilia: Secondary | ICD-10-CM | POA: Diagnosis not present

## 2021-02-01 DIAGNOSIS — E785 Hyperlipidemia, unspecified: Secondary | ICD-10-CM | POA: Diagnosis not present

## 2021-02-01 DIAGNOSIS — E1121 Type 2 diabetes mellitus with diabetic nephropathy: Secondary | ICD-10-CM | POA: Diagnosis not present

## 2021-02-01 DIAGNOSIS — R7309 Other abnormal glucose: Secondary | ICD-10-CM | POA: Diagnosis not present

## 2021-02-01 DIAGNOSIS — Z6828 Body mass index (BMI) 28.0-28.9, adult: Secondary | ICD-10-CM | POA: Diagnosis not present

## 2021-02-01 DIAGNOSIS — I4821 Permanent atrial fibrillation: Secondary | ICD-10-CM | POA: Diagnosis not present

## 2021-02-01 DIAGNOSIS — I700 Atherosclerosis of aorta: Secondary | ICD-10-CM | POA: Diagnosis not present

## 2021-02-03 ENCOUNTER — Telehealth: Payer: Self-pay | Admitting: *Deleted

## 2021-02-03 NOTE — Telephone Encounter (Signed)
Cardiac catheterization scheduled at [facility] for: Friday February 04, 2021 [facility] Main Entrance [facility]) at: 10 AM   Diet-no solid food after midnight prior to cath, clear liquids until 5 AM day of procedure.  Medication instructions for procedure: -Hold:  Metformin-day of procedure and 48 hours post procedure  Warfarin (coumadin)-none 01/30/21 until post procedure -Except hold medications usual morning medications can be taken pre-cath with sips of water including aspirin 81 mg.    Confirmed patient has responsible adult to drive home post procedure and be with patient first 24 hours after arriving home.  [facility] does allow one visitor to accompany you and wait in the hospital waiting room while you are there for your procedure. You and your visitor will be asked to wear a mask once you enter the hospital.   Patient reports does not currently have any new symptoms concerning for COVID-19 and no household members with COVID-19 like illness.    Reviewed procedure/mask/visitor instructions with patient.

## 2021-02-04 ENCOUNTER — Ambulatory Visit (HOSPITAL_COMMUNITY)
Admission: RE | Admit: 2021-02-04 | Discharge: 2021-02-04 | Disposition: A | Discharge status: Home / Self Care | Payer: Medicare HMO | Attending: Cardiovascular Disease | Admitting: Cardiovascular Disease

## 2021-02-04 ENCOUNTER — Encounter (HOSPITAL_COMMUNITY)
Admission: RE | Disposition: A | Discharge status: Home / Self Care | Payer: Medicare HMO | Source: Home / Self Care | Attending: Cardiovascular Disease

## 2021-02-04 DIAGNOSIS — I352 Nonrheumatic aortic (valve) stenosis with insufficiency: Secondary | ICD-10-CM | POA: Insufficient documentation

## 2021-02-04 DIAGNOSIS — I350 Nonrheumatic aortic (valve) stenosis: Secondary | ICD-10-CM | POA: Diagnosis not present

## 2021-02-04 DIAGNOSIS — I2584 Coronary atherosclerosis due to calcified coronary lesion: Secondary | ICD-10-CM | POA: Diagnosis not present

## 2021-02-04 DIAGNOSIS — I4891 Unspecified atrial fibrillation: Secondary | ICD-10-CM | POA: Insufficient documentation

## 2021-02-04 HISTORY — PX: RIGHT/LEFT HEART CATH AND CORONARY ANGIOGRAPHY: CATH118266

## 2021-02-04 LAB — PROTIME-INR
INR: 1.3 — ABNORMAL HIGH (ref 0.8–1.2)
Prothrombin Time: 16.3 seconds — ABNORMAL HIGH (ref 11.4–15.2)

## 2021-02-04 LAB — POCT I-STAT 7, (LYTES, BLD GAS, ICA,H+H)
Acid-Base Excess: 0 mmol/L (ref 0.0–2.0)
Bicarbonate: 26 mmol/L (ref 20.0–28.0)
Calcium, Ion: 1.2 mmol/L (ref 1.15–1.40)
HCT: 35 % — ABNORMAL LOW (ref 36.0–46.0)
Hemoglobin: 11.9 g/dL — ABNORMAL LOW (ref 12.0–15.0)
O2 Saturation: 91 %
Potassium: 3.9 mmol/L (ref 3.5–5.1)
Sodium: 143 mmol/L (ref 135–145)
TCO2: 27 mmol/L (ref 22–32)
pCO2 arterial: 45.2 mmHg (ref 32.0–48.0)
pH, Arterial: 7.367 (ref 7.350–7.450)
pO2, Arterial: 63 mmHg — ABNORMAL LOW (ref 83.0–108.0)

## 2021-02-04 LAB — POCT I-STAT EG7
Acid-Base Excess: 0 mmol/L (ref 0.0–2.0)
Bicarbonate: 26 mmol/L (ref 20.0–28.0)
Calcium, Ion: 1.07 mmol/L — ABNORMAL LOW (ref 1.15–1.40)
HCT: 33 % — ABNORMAL LOW (ref 36.0–46.0)
Hemoglobin: 11.2 g/dL — ABNORMAL LOW (ref 12.0–15.0)
O2 Saturation: 69 %
Potassium: 3.4 mmol/L — ABNORMAL LOW (ref 3.5–5.1)
Sodium: 146 mmol/L — ABNORMAL HIGH (ref 135–145)
TCO2: 27 mmol/L (ref 22–32)
pCO2, Ven: 46.4 mmHg (ref 44.0–60.0)
pH, Ven: 7.357 (ref 7.250–7.430)
pO2, Ven: 38 mmHg (ref 32.0–45.0)

## 2021-02-04 LAB — GLUCOSE, CAPILLARY: Glucose-Capillary: 97 mg/dL (ref 70–99)

## 2021-02-04 SURGERY — RIGHT/LEFT HEART CATH AND CORONARY ANGIOGRAPHY
Anesthesia: LOCAL

## 2021-02-04 MED ORDER — HEPARIN SODIUM (PORCINE) 1000 UNIT/ML IJ SOLN
INTRAMUSCULAR | Status: AC
  Filled 2021-02-04: qty 10

## 2021-02-04 MED ORDER — HEPARIN (PORCINE) IN NACL 1000-0.9 UT/500ML-% IV SOLN
INTRAVENOUS | Status: AC
  Filled 2021-02-04: qty 1000

## 2021-02-04 MED ORDER — VERAPAMIL HCL 2.5 MG/ML IV SOLN
INTRAVENOUS | Status: DC | PRN
  Administered 2021-02-04: 10 mL via INTRA_ARTERIAL

## 2021-02-04 MED ORDER — VERAPAMIL HCL 2.5 MG/ML IV SOLN
INTRAVENOUS | Status: AC
  Filled 2021-02-04: qty 2

## 2021-02-04 MED ORDER — HEPARIN SODIUM (PORCINE) 1000 UNIT/ML IJ SOLN
INTRAMUSCULAR | Status: DC | PRN
  Administered 2021-02-04: 4000 [IU] via INTRAVENOUS

## 2021-02-04 MED ORDER — ASPIRIN 81 MG PO CHEW: 81.0000 mg | CHEWABLE_TABLET | ORAL | Status: DC

## 2021-02-04 MED ORDER — HEPARIN (PORCINE) IN NACL 1000-0.9 UT/500ML-% IV SOLN
INTRAVENOUS | Status: DC | PRN
  Administered 2021-02-04 (×2): 500 mL

## 2021-02-04 MED ORDER — LIDOCAINE HCL (PF) 1 % IJ SOLN
INTRAMUSCULAR | Status: DC | PRN
  Administered 2021-02-04 (×2): 2 mL

## 2021-02-04 MED ORDER — HYDRALAZINE HCL 20 MG/ML IJ SOLN: 10.0000 mg | INTRAMUSCULAR | Status: DC | PRN

## 2021-02-04 MED ORDER — SODIUM CHLORIDE 0.9% FLUSH: 3.0000 mL | Freq: Two times a day (BID) | INTRAVENOUS | Status: DC

## 2021-02-04 MED ORDER — SODIUM CHLORIDE 0.9% FLUSH: 3.0000 mL | INTRAVENOUS | Status: DC | PRN

## 2021-02-04 MED ORDER — ONDANSETRON HCL 4 MG/2ML IJ SOLN: 4.0000 mg | Freq: Four times a day (QID) | INTRAMUSCULAR | Status: DC | PRN

## 2021-02-04 MED ORDER — MIDAZOLAM HCL 2 MG/2ML IJ SOLN
INTRAMUSCULAR | Status: AC
  Filled 2021-02-04: qty 2

## 2021-02-04 MED ORDER — SODIUM CHLORIDE 0.9 % WEIGHT BASED INFUSION: 1.0000 mL/kg/h | INTRAVENOUS | Status: DC

## 2021-02-04 MED ORDER — SODIUM CHLORIDE 0.9 % IV SOLN: 250.0000 mL | INTRAVENOUS | Status: DC | PRN

## 2021-02-04 MED ORDER — SODIUM CHLORIDE 0.9 % WEIGHT BASED INFUSION
3.0000 mL/kg/h | INTRAVENOUS | Status: AC
  Administered 2021-02-04: 3 mL/kg/h via INTRAVENOUS

## 2021-02-04 MED ORDER — IOHEXOL 350 MG/ML SOLN
INTRAVENOUS | Status: DC | PRN
  Administered 2021-02-04: 40 mL

## 2021-02-04 MED ORDER — FENTANYL CITRATE (PF) 100 MCG/2ML IJ SOLN
INTRAMUSCULAR | Status: DC | PRN
  Administered 2021-02-04: 25 ug via INTRAVENOUS

## 2021-02-04 MED ORDER — ACETAMINOPHEN 325 MG PO TABS: 650.0000 mg | ORAL_TABLET | ORAL | Status: DC | PRN

## 2021-02-04 MED ORDER — LIDOCAINE HCL (PF) 1 % IJ SOLN
INTRAMUSCULAR | Status: AC
  Filled 2021-02-04: qty 30

## 2021-02-04 MED ORDER — FENTANYL CITRATE (PF) 100 MCG/2ML IJ SOLN
INTRAMUSCULAR | Status: AC
  Filled 2021-02-04: qty 2

## 2021-02-04 MED ORDER — MIDAZOLAM HCL 2 MG/2ML IJ SOLN
INTRAMUSCULAR | Status: DC | PRN
  Administered 2021-02-04: 2 mg via INTRAVENOUS

## 2021-02-04 MED ORDER — LABETALOL HCL 5 MG/ML IV SOLN: 10.0000 mg | INTRAVENOUS | Status: DC | PRN

## 2021-02-04 SURGICAL SUPPLY — 13 items
CATH INFINITI 5 FR JL3.5 (CATHETERS) ×2 IMPLANT
CATH INFINITI JR4 5F (CATHETERS) ×2 IMPLANT
CATH SWAN GANZ 7F STRAIGHT (CATHETERS) ×2 IMPLANT
DEVICE RAD TR BAND REGULAR (VASCULAR PRODUCTS) ×2 IMPLANT
GLIDESHEATH SLEND SS 6F .021 (SHEATH) ×2 IMPLANT
GLIDESHEATH SLENDER 7FR .021G (SHEATH) ×2 IMPLANT
GUIDEWIRE INQWIRE 1.5J.035X260 (WIRE) IMPLANT
INQWIRE 1.5J .035X260CM (WIRE) ×3
KIT HEART LEFT (KITS) ×3 IMPLANT
PACK CARDIAC CATHETERIZATION (CUSTOM PROCEDURE TRAY) ×3 IMPLANT
SHEATH GLIDE SLENDER 4/5FR (SHEATH) IMPLANT
TRANSDUCER W/STOPCOCK (MISCELLANEOUS) ×3 IMPLANT
TUBING CIL FLEX 10 FLL-RA (TUBING) ×3 IMPLANT

## 2021-02-04 NOTE — Interval H&P Note (Signed)
History and Physical Interval Note:  02/04/2021 12:38 PM  Anne Wolf  has presented today for surgery, with the diagnosis of aortic stenosis.  The various methods of treatment have been discussed with the patient and family. After consideration of risks, benefits and other options for treatment, the patient has consented to  Procedure(s): RIGHT/LEFT HEART CATH AND CORONARY ANGIOGRAPHY (N/A) as a surgical intervention.  The patient's history has been reviewed, patient examined, no change in status, stable for surgery.  I have reviewed the patient's chart and labs.  Questions were answered to the patient's satisfaction.     Sherren Mocha

## 2021-02-07 ENCOUNTER — Encounter (HOSPITAL_COMMUNITY): Payer: Self-pay | Admitting: Cardiovascular Disease

## 2021-02-15 ENCOUNTER — Other Ambulatory Visit: Payer: Self-pay

## 2021-02-15 ENCOUNTER — Ambulatory Visit (HOSPITAL_COMMUNITY)
Admission: RE | Admit: 2021-02-15 | Discharge: 2021-02-15 | Disposition: A | Discharge status: Home / Self Care | Payer: Medicare HMO | Source: Ambulatory Visit | Attending: Cardiovascular Disease | Admitting: Cardiovascular Disease

## 2021-02-15 ENCOUNTER — Encounter (HOSPITAL_COMMUNITY): Payer: Self-pay

## 2021-02-15 DIAGNOSIS — I350 Nonrheumatic aortic (valve) stenosis: Secondary | ICD-10-CM

## 2021-02-15 DIAGNOSIS — Z01812 Encounter for preprocedural laboratory examination: Secondary | ICD-10-CM | POA: Insufficient documentation

## 2021-02-15 DIAGNOSIS — I517 Cardiomegaly: Secondary | ICD-10-CM | POA: Diagnosis not present

## 2021-02-15 DIAGNOSIS — K5730 Diverticulosis of large intestine without perforation or abscess without bleeding: Secondary | ICD-10-CM | POA: Diagnosis not present

## 2021-02-15 MED ORDER — METOPROLOL TARTRATE 5 MG/5ML IV SOLN
INTRAVENOUS | Status: AC
  Filled 2021-02-15: qty 5

## 2021-02-15 MED ORDER — METOPROLOL TARTRATE 5 MG/5ML IV SOLN
INTRAVENOUS | Status: AC
  Administered 2021-02-15: 11:00:00 5 mg via INTRAVENOUS
  Filled 2021-02-15: qty 10

## 2021-02-15 MED ORDER — DILTIAZEM HCL 25 MG/5ML IV SOLN
INTRAVENOUS | Status: AC
  Filled 2021-02-15: qty 5

## 2021-02-15 MED ORDER — IOHEXOL 350 MG/ML SOLN
95.0000 mL | Freq: Once | INTRAVENOUS | Status: AC | PRN
  Administered 2021-02-15: 11:00:00 95 mL via INTRAVENOUS

## 2021-02-15 MED ORDER — METOPROLOL TARTRATE 5 MG/5ML IV SOLN
5.0000 mg | INTRAVENOUS | Status: DC | PRN
Start: 2021-02-15 — End: 2021-02-16
  Administered 2021-02-15: 11:00:00 10 mg via INTRAVENOUS

## 2021-02-15 MED ORDER — DILTIAZEM HCL 25 MG/5ML IV SOLN
5.0000 mg | INTRAVENOUS | Status: DC | PRN
Start: 2021-02-15 — End: 2021-02-16

## 2021-02-17 ENCOUNTER — Emergency Department (HOSPITAL_COMMUNITY): Payer: Medicare HMO

## 2021-02-17 ENCOUNTER — Ambulatory Visit (INDEPENDENT_AMBULATORY_CARE_PROVIDER_SITE_OTHER): Payer: Medicare HMO | Admitting: *Deleted

## 2021-02-17 ENCOUNTER — Encounter (HOSPITAL_COMMUNITY): Payer: Self-pay | Admitting: Emergency Medicine

## 2021-02-17 ENCOUNTER — Observation Stay (HOSPITAL_COMMUNITY)
Admission: EM | Admit: 2021-02-17 | Discharge: 2021-02-18 | Disposition: A | Discharge status: Home / Self Care | Payer: Medicare HMO | Attending: Internal Medicine | Admitting: Internal Medicine

## 2021-02-17 ENCOUNTER — Other Ambulatory Visit: Payer: Self-pay

## 2021-02-17 DIAGNOSIS — R2689 Other abnormalities of gait and mobility: Secondary | ICD-10-CM | POA: Diagnosis not present

## 2021-02-17 DIAGNOSIS — Z5181 Encounter for therapeutic drug level monitoring: Secondary | ICD-10-CM

## 2021-02-17 DIAGNOSIS — Z79899 Other long term (current) drug therapy: Secondary | ICD-10-CM | POA: Diagnosis not present

## 2021-02-17 DIAGNOSIS — Z7984 Long term (current) use of oral hypoglycemic drugs: Secondary | ICD-10-CM | POA: Diagnosis not present

## 2021-02-17 DIAGNOSIS — I6521 Occlusion and stenosis of right carotid artery: Secondary | ICD-10-CM | POA: Diagnosis not present

## 2021-02-17 DIAGNOSIS — I110 Hypertensive heart disease with heart failure: Secondary | ICD-10-CM | POA: Insufficient documentation

## 2021-02-17 DIAGNOSIS — E119 Type 2 diabetes mellitus without complications: Secondary | ICD-10-CM

## 2021-02-17 DIAGNOSIS — Z7901 Long term (current) use of anticoagulants: Secondary | ICD-10-CM | POA: Diagnosis not present

## 2021-02-17 DIAGNOSIS — I10 Essential (primary) hypertension: Secondary | ICD-10-CM

## 2021-02-17 DIAGNOSIS — Z20822 Contact with and (suspected) exposure to covid-19: Secondary | ICD-10-CM | POA: Insufficient documentation

## 2021-02-17 DIAGNOSIS — R531 Weakness: Secondary | ICD-10-CM | POA: Diagnosis present

## 2021-02-17 DIAGNOSIS — G459 Transient cerebral ischemic attack, unspecified: Principal | ICD-10-CM | POA: Diagnosis present

## 2021-02-17 DIAGNOSIS — I350 Nonrheumatic aortic (valve) stenosis: Secondary | ICD-10-CM

## 2021-02-17 DIAGNOSIS — R404 Transient alteration of awareness: Secondary | ICD-10-CM | POA: Diagnosis not present

## 2021-02-17 DIAGNOSIS — R29810 Facial weakness: Secondary | ICD-10-CM | POA: Diagnosis not present

## 2021-02-17 DIAGNOSIS — E782 Mixed hyperlipidemia: Secondary | ICD-10-CM

## 2021-02-17 DIAGNOSIS — I4819 Other persistent atrial fibrillation: Secondary | ICD-10-CM

## 2021-02-17 DIAGNOSIS — I482 Chronic atrial fibrillation, unspecified: Secondary | ICD-10-CM | POA: Diagnosis not present

## 2021-02-17 DIAGNOSIS — I509 Heart failure, unspecified: Secondary | ICD-10-CM | POA: Diagnosis not present

## 2021-02-17 LAB — COMPREHENSIVE METABOLIC PANEL
ALT: 23 U/L (ref 0–44)
AST: 33 U/L (ref 15–41)
Albumin: 3.9 g/dL (ref 3.5–5.0)
Alkaline Phosphatase: 46 U/L (ref 38–126)
Anion gap: 9 (ref 5–15)
BUN: 18 mg/dL (ref 8–23)
CO2: 25 mmol/L (ref 22–32)
Calcium: 9.3 mg/dL (ref 8.9–10.3)
Chloride: 101 mmol/L (ref 98–111)
Creatinine, Ser: 0.67 mg/dL (ref 0.44–1.00)
GFR, Estimated: >60 mL/min (ref >60)
Glucose, Bld: 114 mg/dL — ABNORMAL HIGH (ref 70–99)
Potassium: 3.7 mmol/L (ref 3.5–5.1)
Sodium: 135 mmol/L (ref 135–145)
Total Bilirubin: 0.5 mg/dL (ref 0.3–1.2)
Total Protein: 7.3 g/dL (ref 6.5–8.1)

## 2021-02-17 LAB — I-STAT CHEM 8, ED
BUN: 17 mg/dL (ref 8–23)
Calcium, Ion: 1.08 mmol/L — ABNORMAL LOW (ref 1.15–1.40)
Chloride: 102 mmol/L (ref 98–111)
Creatinine, Ser: 0.7 mg/dL (ref 0.44–1.00)
Glucose, Bld: 108 mg/dL — ABNORMAL HIGH (ref 70–99)
HCT: 41 % (ref 36.0–46.0)
Hemoglobin: 13.9 g/dL (ref 12.0–15.0)
Potassium: 3.7 mmol/L (ref 3.5–5.1)
Sodium: 139 mmol/L (ref 135–145)
TCO2: 27 mmol/L (ref 22–32)

## 2021-02-17 LAB — CBC
HCT: 42.4 % (ref 36.0–46.0)
Hemoglobin: 13.7 g/dL (ref 12.0–15.0)
MCH: 30.3 pg (ref 26.0–34.0)
MCHC: 32.3 g/dL (ref 30.0–36.0)
MCV: 93.8 fL (ref 80.0–100.0)
Platelets: 207 10*3/uL (ref 150–400)
RBC: 4.52 MIL/uL (ref 3.87–5.11)
RDW: 13.7 % (ref 11.5–15.5)
WBC: 8.1 10*3/uL (ref 4.0–10.5)
nRBC: 0 % (ref 0.0–0.2)

## 2021-02-17 LAB — DIFFERENTIAL
Abs Immature Granulocytes: 0.02 10*3/uL (ref 0.00–0.07)
Basophils Absolute: 0.1 10*3/uL (ref 0.0–0.1)
Basophils Relative: 1 %
Eosinophils Absolute: 0.4 10*3/uL (ref 0.0–0.5)
Eosinophils Relative: 5 %
Immature Granulocytes: 0 %
Lymphocytes Relative: 35 %
Lymphs Abs: 2.8 10*3/uL (ref 0.7–4.0)
Monocytes Absolute: 0.8 10*3/uL (ref 0.1–1.0)
Monocytes Relative: 10 %
Neutro Abs: 4 10*3/uL (ref 1.7–7.7)
Neutrophils Relative %: 49 %

## 2021-02-17 LAB — RESP PANEL BY RT-PCR (FLU A&B, COVID) ARPGX2
Influenza A by PCR: NEGATIVE
Influenza B by PCR: NEGATIVE
SARS Coronavirus 2 by RT PCR: NEGATIVE

## 2021-02-17 LAB — POCT INR: INR: 1.3 — AB (ref 2.0–3.0)

## 2021-02-17 LAB — ETHANOL: Alcohol, Ethyl (B): <10 mg/dL (ref <10)

## 2021-02-17 LAB — CBG MONITORING, ED: Glucose-Capillary: 98 mg/dL (ref 70–99)

## 2021-02-17 MED ORDER — IOHEXOL 350 MG/ML SOLN
75.0000 mL | Freq: Once | INTRAVENOUS | Status: AC | PRN
  Administered 2021-02-17: 23:00:00 75 mL via INTRAVENOUS

## 2021-02-17 NOTE — Patient Instructions (Signed)
Take warfarin 1 1/2 tablets tonight and tomorrow night the resume 1 tablet daily except 1/2 tablet on Wednesdays and Saturdays Recheck INR in 2 weeks.

## 2021-02-17 NOTE — ED Notes (Signed)
While Pt was in CT, Pt was able to follow commands and had moderate grip strength in left hand. Facila droop began to resolve and Pt was alert and oriented X 4.

## 2021-02-17 NOTE — ED Provider Notes (Signed)
[facility] EMERGENCY DEPARTMENT Provider Note   CSN: [id number] Arrival date & time: 02/17/21  2244     History Chief Complaint  Patient presents with   Code Stroke    Anne Wolf is a 87 y.o. female.  HPI  This patient is an 87 year old female, she has a history of atrial fibrillation on chronic warfarin therapy, history of congestive heart failure diabetes, history of hyperlipidemia and presents to the hospital today with a complaint of left-sided weakness.  I have spoken with both the paramedics and the husband, the husband reports that at approximately 10:10 PM he left the living room where the patient was sitting on the couch doing a crossword puzzle, he came back 10 minutes later and found the patient trying to reach for something on the ground, he realized that she was weak on the left side, had a left-sided facial droop and was not herself, they called the paramedics.  The paramedics found the patient with severe left-sided weakness of both the arm and the leg, unable to walk, having difficulty speaking and a rightward gaze.  They activated a code stroke from the field.  The patient denied having any headache to me, the husband states that she was in her usual state of health earlier in the day when they went to get her Coumadin level checked, he states that the blood was "too thick".  He is unsure when her last dose of warfarin was though he does think she took her morning medication prior to leaving the house this morning.  The INR measured this morning was 1.3 per my review of the medical record  Past Medical History:  Diagnosis Date   Aortic stenosis    mild; mild MR; slightly increased pulmonary artery pressure with mild RVH; normal LV-2010   Arthritis    Atrial fibrillation (HCC)    chronic anticoagulation; adequate HR control on minimal AV nodal blocking medication    Cancer ([place])    skin cancers removed from face   CHF (congestive heart failure) ([place])     Diabetes mellitus without complication ([place])    2   Dysrhythmia    A FIB   Fasting hyperglycemia    + microalbuminuria; A1c of 6.5% in 11/2010   Hematochezia 01/2009   01/2009-presumed ischemic colitis; history of diverticulosis   History of kidney stones    Hx of adenomatous colonic polyps 2005   adenomatous polyp; negative colonoscopy in 2009   Hyperlipidemia     Patient Active Problem List   Diagnosis Date Noted   Acute venous embolism and thrombosis of deep vessels of proximal lower extremity ([place]) 10/28/2019   History of diagnostic tests 08/07/2012   Fasting hyperglycemia    Aortic stenosis    Hx of adenomatous colonic polyps    Long term (current) use of anticoagulants 05/31/2010   Hyperlipidemia 02/08/2009   Hematochezia 01/20/2009    Past Surgical History:  Procedure Laterality Date   CATARACT EXTRACTION W/PHACO Right 03/23/2014   Procedure: CATARACT EXTRACTION PHACO AND INTRAOCULAR LENS PLACEMENT ([place]);  Surgeon: Williams Che, MD;  Location: [facility];  Service: Ophthalmology;  Laterality: Right;  CDE:2.83   CATARACT EXTRACTION W/PHACO Left 07/27/2014   Procedure: CATARACT EXTRACTION PHACO AND INTRAOCULAR LENS PLACEMENT LEFT EYE CDE=6.97;  Surgeon: Williams Che, MD;  Location: [facility];  Service: Ophthalmology;  Laterality: Left;   COLONOSCOPY  2009   negative; prior study with adenomatous polyp   CYSTOSCOPY/URETEROSCOPY/HOLMIUM LASER/STENT PLACEMENT Right 08/21/2016   Procedure:  CYSTOSCOPY/URETEROSCOPY/RETROGRADE PYELOGRAM//STENT PLACEMENT;  Surgeon: Nickie Retort, MD;  Location: [facility];  Service: Urology;  Laterality: Right;   EYE SURGERY     RIGHT/LEFT HEART CATH AND CORONARY ANGIOGRAPHY N/A 02/04/2021   Procedure: RIGHT/LEFT HEART CATH AND CORONARY ANGIOGRAPHY;  Surgeon: Sherren Mocha, MD;  Location: [facility];  Service: Cardiovascular;  Laterality: N/A;     OB History   No obstetric history on file.     Family History  Problem Relation Age of  Onset   Cancer Father    Arrhythmia Sister        Atrial fibrillation    Social History   Tobacco Use   Smoking status: Never   Smokeless tobacco: Never  Vaping Use   Vaping Use: Never used  Substance Use Topics   Alcohol use: No   Drug use: No    Home Medications Prior to Admission medications   Medication Sig Start Date End Date Taking? Authorizing Provider  alendronate (FOSAMAX) 70 MG tablet Take 70 mg by mouth once a week.    [provider]  Biotin 5000 MCG CAPS Take 5,000 mcg by mouth daily.    [provider]  calcium carbonate (OSCAL) 1500 (600 Ca) MG TABS tablet Take 600 mg of elemental calcium by mouth daily.    [provider]  cholecalciferol (VITAMIN D) 1000 units tablet Take 1,000 Units by mouth at bedtime.    [provider]  Co-Enzyme Q-10 100 MG CAPS Take 100 mg by mouth at bedtime.    [provider]  lisinopril (PRINIVIL,ZESTRIL) 10 MG tablet Take 10 mg by mouth at bedtime.    [provider]  loratadine (CLARITIN) 10 MG tablet Take 10 mg by mouth at bedtime.    [provider]  metFORMIN (GLUCOPHAGE) 500 MG tablet Take 500 mg by mouth 2 (two) times daily with a meal.     [provider]  metoprolol succinate (TOPROL-XL) 25 MG 24 hr tablet Take 25 mg by mouth at bedtime.    [provider]  Multiple Vitamins-Minerals (CENTRUM SILVER) tablet Take 1 tablet by mouth at bedtime.     [provider]  simvastatin (ZOCOR) 40 MG tablet Take 40 mg by mouth at bedtime.      [provider]  warfarin (COUMADIN) 2 MG tablet Take 1 tablet daily except 1/2 tablet on Wednesdays and Saturdays or as directed Patient taking differently: Take 1-2 mg by mouth See admin instructions. Take 1 mg on Wednesday and Saturday. All the other days take 2 mg in the evening 12/08/20   Satira Sark, MD    Allergies    Cat hair extract  Review of Systems   Review of Systems  All other  systems reviewed and are negative.  Physical Exam Updated Vital Signs There were no vitals taken for this visit.  Physical Exam Vitals and nursing note reviewed.  Constitutional:      General: She is not in acute distress.    Appearance: She is well-developed.  HENT:     Head: Normocephalic and atraumatic.     Mouth/Throat:     Pharynx: No oropharyngeal exudate.  Eyes:     General: No scleral icterus.       Right eye: No discharge.        Left eye: No discharge.     Conjunctiva/sclera: Conjunctivae normal.     Pupils: Pupils are equal, round, and reactive to light.  Neck:  Thyroid: No thyromegaly.     Vascular: No JVD.  Cardiovascular:     Rate and Rhythm: Normal rate. Rhythm irregular.     Heart sounds: Normal heart sounds. No murmur heard.   No friction rub. No gallop.  Pulmonary:     Effort: Pulmonary effort is normal. No respiratory distress.     Breath sounds: Normal breath sounds. No wheezing or rales.  Abdominal:     General: Bowel sounds are normal. There is no distension.     Palpations: Abdomen is soft. There is no mass.     Tenderness: There is no abdominal tenderness.  Musculoskeletal:        General: No tenderness. Normal range of motion.     Cervical back: Normal range of motion and neck supple.  Lymphadenopathy:     Cervical: No cervical adenopathy.  Skin:    General: Skin is warm and dry.     Findings: No erythema or rash.  Neurological:     Mental Status: She is alert.     Coordination: Coordination normal.     Comments: Left-sided facial droop, left upper extremity flaccidity, left lower extremity flaccidity, normal right upper and right lower extremity.  The patient is able to answer questions, she does not move her eyes past midline to the left, has leftward extinction, is able to have a rightward visual field but no left visual field.  Psychiatric:        Behavior: Behavior normal.    ED Results / Procedures / Treatments   Labs (all labs  ordered are listed, but only abnormal results are displayed) Labs Reviewed  RESP PANEL BY RT-PCR (FLU A&B, COVID) ARPGX2  ETHANOL  PROTIME-INR  APTT  CBC  DIFFERENTIAL  COMPREHENSIVE METABOLIC PANEL  RAPID URINE DRUG SCREEN, HOSP PERFORMED  URINALYSIS, ROUTINE W REFLEX MICROSCOPIC  CBG MONITORING, ED  I-STAT CHEM 8, ED    EKG EKG Interpretation  Date/Time:  Thursday February 17 2021 23:25:46 EST Ventricular Rate:  79 PR Interval:    QRS Duration: 112 QT Interval:  417 QTC Calculation: 478 R Axis:   -68 Text Interpretation: Atrial fibrillation Left anterior fascicular block Confirmed by Lajean Saver [phone]) on 02/18/2021 10:53:49 AM  Radiology CT Angio Head W or Wo Contrast  Result Date: 02/17/2021 CLINICAL DATA:  Left facial droop and left arm weakness EXAM: CT HEAD WITHOUT CONTRAST CT ANGIOGRAPHY OF THE HEAD AND NECK TECHNIQUE: Contiguous axial images were obtained from the base of the skull through the vertex without intravenous contrast. Multidetector CT imaging of the head and neck was performed using the standard protocol during bolus administration of intravenous contrast. Multiplanar CT image reconstructions and MIPs were obtained to evaluate the vascular anatomy. Carotid stenosis measurements (when applicable) are obtained utilizing NASCET criteria, using the distal internal carotid diameter as the denominator. CONTRAST:  6mL OMNIPAQUE IOHEXOL 350 MG/ML SOLN COMPARISON:  None. FINDINGS: CT HEAD FINDINGS Brain: There is no mass, hemorrhage or extra-axial collection. The size and configuration of the ventricles and extra-axial CSF spaces are normal. The brain parenchyma is normal, without evidence of acute or chronic infarction. Vascular: No abnormal hyperdensity of the major intracranial arteries or dural venous sinuses. No intracranial atherosclerosis. Skull: The visualized skull base, calvarium and extracranial soft tissues are normal. Sinuses/Orbits: No fluid levels or  advanced mucosal thickening of the visualized paranasal sinuses. No mastoid or middle ear effusion. The orbits are normal. ASPECTS [facility] Stroke Program Early CT Score) - Ganglionic level infarction (caudate, lentiform nuclei,  internal capsule, insula, M1-M3 cortex): 7 - Supraganglionic infarction (M4-M6 cortex): 3 Total score (0-10 with 10 being normal): 10 CTA NECK FINDINGS SKELETON: There is no bony spinal canal stenosis. No lytic or blastic lesion. OTHER NECK: Normal pharynx, larynx and major salivary glands. No cervical lymphadenopathy. Unremarkable thyroid gland. UPPER CHEST: No pneumothorax or pleural effusion. No nodules or masses. AORTIC ARCH: There is calcific atherosclerosis of the aortic arch. There is no aneurysm, dissection or hemodynamically significant stenosis of the visualized portion of the aorta. Conventional 3 vessel aortic branching pattern. The visualized proximal subclavian arteries are widely patent. RIGHT CAROTID SYSTEM: Normal without aneurysm, dissection or stenosis. LEFT CAROTID SYSTEM: Normal without aneurysm, dissection or stenosis. VERTEBRAL ARTERIES: Left dominant configuration. Both origins are clearly patent. There is no dissection, occlusion or flow-limiting stenosis to the skull base (V1-V3 segments). CTA HEAD FINDINGS POSTERIOR CIRCULATION: --Vertebral arteries: Normal V4 segments. --Inferior cerebellar arteries: Normal. --Basilar artery: Normal. --Superior cerebellar arteries: Normal. --Posterior cerebral arteries (PCA): Normal. ANTERIOR CIRCULATION: --Intracranial internal carotid arteries: Normal. --Anterior cerebral arteries (ACA): The right anterior cerebral artery A1 segment is occluded. The proximal right A2 segment is also occluded. There is distal reconstitution. --Middle cerebral arteries (MCA): Normal. VENOUS SINUSES: As permitted by contrast timing, patent. ANATOMIC VARIANTS: None Review of the MIP images confirms the above findings. IMPRESSION: 1. No intracranial  hemorrhage.  ASPECTS 10. 2. 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 This is in keeping with left-sided weakness. Critical Value/emergent results were called by telephone at the time of interpretation on 02/17/2021 at 11:40 pm to Dr. Sedonia Small, Who verbally acknowledged these results. Aortic Atherosclerosis (ICD10-I70.0). Electronically Signed   By: Ulyses Jarred M.D.   On: 02/17/2021 23:42   CT Angio Neck W and/or Wo Contrast  Result Date: 02/17/2021 CLINICAL DATA:  Left facial droop and left arm weakness EXAM: CT HEAD WITHOUT CONTRAST CT ANGIOGRAPHY OF THE HEAD AND NECK TECHNIQUE: Contiguous axial images were obtained from the base of the skull through the vertex without intravenous contrast. Multidetector CT imaging of the head and neck was performed using the standard protocol during bolus administration of intravenous contrast. Multiplanar CT image reconstructions and MIPs were obtained to evaluate the vascular anatomy. Carotid stenosis measurements (when applicable) are obtained utilizing NASCET criteria, using the distal internal carotid diameter as the denominator. CONTRAST:  72mL OMNIPAQUE IOHEXOL 350 MG/ML SOLN COMPARISON:  None. FINDINGS: CT HEAD FINDINGS Brain: There is no mass, hemorrhage or extra-axial collection. The size and configuration of the ventricles and extra-axial CSF spaces are normal. The brain parenchyma is normal, without evidence of acute or chronic infarction. Vascular: No abnormal hyperdensity of the major intracranial arteries or dural venous sinuses. No intracranial atherosclerosis. Skull: The visualized skull base, calvarium and extracranial soft tissues are normal. Sinuses/Orbits: No fluid levels or advanced mucosal thickening of the visualized paranasal sinuses. No mastoid or middle ear effusion. The orbits are normal. ASPECTS ([place] Stroke Program Early CT Score) - Ganglionic level infarction (caudate, lentiform  nuclei, internal capsule, insula, M1-M3 cortex): 7 - Supraganglionic infarction (M4-M6 cortex): 3 Total score (0-10 with 10 being normal): 10 CTA NECK FINDINGS SKELETON: There is no bony spinal canal stenosis. No lytic or blastic lesion. OTHER NECK: Normal pharynx, larynx and major salivary glands. No cervical lymphadenopathy. Unremarkable thyroid gland. UPPER CHEST: No pneumothorax or pleural effusion. No nodules or masses. AORTIC ARCH: There is calcific atherosclerosis of the aortic arch. There is no aneurysm, dissection or hemodynamically  significant stenosis of the visualized portion of the aorta. Conventional 3 vessel aortic branching pattern. The visualized proximal subclavian arteries are widely patent. RIGHT CAROTID SYSTEM: Normal without aneurysm, dissection or stenosis. LEFT CAROTID SYSTEM: Normal without aneurysm, dissection or stenosis. VERTEBRAL ARTERIES: Left dominant configuration. Both origins are clearly patent. There is no dissection, occlusion or flow-limiting stenosis to the skull base (V1-V3 segments). CTA HEAD FINDINGS POSTERIOR CIRCULATION: --Vertebral arteries: Normal V4 segments. --Inferior cerebellar arteries: Normal. --Basilar artery: Normal. --Superior cerebellar arteries: Normal. --Posterior cerebral arteries (PCA): Normal. ANTERIOR CIRCULATION: --Intracranial internal carotid arteries: Normal. --Anterior cerebral arteries (ACA): The right anterior cerebral artery A1 segment is occluded. The proximal right A2 segment is also occluded. There is distal reconstitution. --Middle cerebral arteries (MCA): Normal. VENOUS SINUSES: As permitted by contrast timing, patent. ANATOMIC VARIANTS: None Review of the MIP images confirms the above findings. IMPRESSION: 1. No intracranial hemorrhage.  ASPECTS 10. 2. 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 This is in keeping with left-sided weakness. Critical Value/emergent results were called  by telephone at the time of interpretation on 02/17/2021 at 11:40 pm to Dr. Sedonia Small, Who verbally acknowledged these results. Aortic Atherosclerosis (ICD10-I70.0). Electronically Signed   By: Ulyses Jarred M.D.   On: 02/17/2021 23:42   MR BRAIN WO CONTRAST  Result Date: 02/18/2021 CLINICAL DATA:  Found on floor with left facial droop EXAM: MRI HEAD WITHOUT CONTRAST TECHNIQUE: Multiplanar, multiecho pulse sequences of the brain and surrounding structures were obtained without intravenous contrast. COMPARISON:  CT and CTA from yesterday FINDINGS: Brain: Acute infarction involving the right caudate head, anterior right putamen, and intervening white matter. Two tiny subcortical infarcts in the left parietal lobe which is more subacute in appearance based on degree of diffusion restriction. Background age congruent cerebral volume loss. Age normal white matter appearance. Vascular: Normal flow voids. Skull and upper cervical spine: Hyperostosis interna. No focal marrow lesion. Sinuses/Orbits: Bilateral cataract resection IMPRESSION: 1. Acute right basal ganglia infarction. 2. Tiny left parietal white matter infarcts, favored subacute. Electronically Signed   By: Jorje Guild M.D.   On: 02/18/2021 08:41   CT HEAD CODE STROKE WO CONTRAST  Result Date: 02/17/2021 CLINICAL DATA:  Left facial droop and left arm weakness EXAM: CT HEAD WITHOUT CONTRAST CT ANGIOGRAPHY OF THE HEAD AND NECK TECHNIQUE: Contiguous axial images were obtained from the base of the skull through the vertex without intravenous contrast. Multidetector CT imaging of the head and neck was performed using the standard protocol during bolus administration of intravenous contrast. Multiplanar CT image reconstructions and MIPs were obtained to evaluate the vascular anatomy. Carotid stenosis measurements (when applicable) are obtained utilizing NASCET criteria, using the distal internal carotid diameter as the denominator. CONTRAST:  64mL OMNIPAQUE  IOHEXOL 350 MG/ML SOLN COMPARISON:  None. FINDINGS: CT HEAD FINDINGS Brain: There is no mass, hemorrhage or extra-axial collection. The size and configuration of the ventricles and extra-axial CSF spaces are normal. The brain parenchyma is normal, without evidence of acute or chronic infarction. Vascular: No abnormal hyperdensity of the major intracranial arteries or dural venous sinuses. No intracranial atherosclerosis. Skull: The visualized skull base, calvarium and extracranial soft tissues are normal. Sinuses/Orbits: No fluid levels or advanced mucosal thickening of the visualized paranasal sinuses. No mastoid or middle ear effusion. The orbits are normal. ASPECTS [facility] Stroke Program Early CT Score) - Ganglionic level infarction (caudate, lentiform nuclei, internal capsule, insula, M1-M3 cortex): 7 - Supraganglionic infarction (M4-M6 cortex): 3 Total score (  0-10 with 10 being normal): 10 CTA NECK FINDINGS SKELETON: There is no bony spinal canal stenosis. No lytic or blastic lesion. OTHER NECK: Normal pharynx, larynx and major salivary glands. No cervical lymphadenopathy. Unremarkable thyroid gland. UPPER CHEST: No pneumothorax or pleural effusion. No nodules or masses. AORTIC ARCH: There is calcific atherosclerosis of the aortic arch. There is no aneurysm, dissection or hemodynamically significant stenosis of the visualized portion of the aorta. Conventional 3 vessel aortic branching pattern. The visualized proximal subclavian arteries are widely patent. RIGHT CAROTID SYSTEM: Normal without aneurysm, dissection or stenosis. LEFT CAROTID SYSTEM: Normal without aneurysm, dissection or stenosis. VERTEBRAL ARTERIES: Left dominant configuration. Both origins are clearly patent. There is no dissection, occlusion or flow-limiting stenosis to the skull base (V1-V3 segments). CTA HEAD FINDINGS POSTERIOR CIRCULATION: --Vertebral arteries: Normal V4 segments. --Inferior cerebellar arteries: Normal. --Basilar artery:  Normal. --Superior cerebellar arteries: Normal. --Posterior cerebral arteries (PCA): Normal. ANTERIOR CIRCULATION: --Intracranial internal carotid arteries: Normal. --Anterior cerebral arteries (ACA): The right anterior cerebral artery A1 segment is occluded. The proximal right A2 segment is also occluded. There is distal reconstitution. --Middle cerebral arteries (MCA): Normal. VENOUS SINUSES: As permitted by contrast timing, patent. ANATOMIC VARIANTS: None Review of the MIP images confirms the above findings. IMPRESSION: 1. No intracranial hemorrhage.  ASPECTS 10. 2. 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 This is in keeping with left-sided weakness. Critical Value/emergent results were called by telephone at the time of interpretation on 02/17/2021 at 11:40 pm to Dr. Sedonia Small, Who verbally acknowledged these results. Aortic Atherosclerosis (ICD10-I70.0). Electronically Signed   By: Ulyses Jarred M.D.   On: 02/17/2021 23:42   ECHOCARDIOGRAM LIMITED  Result Date: 02/18/2021    ECHOCARDIOGRAM LIMITED REPORT   Patient Name:   Anne Wolf Date of Exam: 02/18/2021 Medical Rec #:  [id number]               Height:       61.0 in Accession #:    [id number]              Weight:       152.3 lb Date of Birth:  1933-03-23              BSA:          1.682 m Patient Age:    55 years                BP:           117/61 mmHg Patient Gender: F                       HR:           85 bpm. Exam Location:  [facility] Procedure: Limited Echo Indications:    Stroke I63.9  History:        Patient has prior history of Echocardiogram examinations, most                 recent 12/27/2020. Arrythmias:Atrial Fibrillation; Risk                 Factors:Hypertension, Diabetes and Dyslipidemia. Long term                 (current) use of anticoagulants, Cancer ([place]) (From Hx).  Sonographer:    Alvino Chapel RCS Referring Phys: [id number] OLADAPO ADEFESO IMPRESSIONS  1. See full  echo done 12/27/20 regarding valvular heart disease.  2. Left  ventricular ejection fraction, by estimation, is 60 to 65%. The left ventricle has normal function. The left ventricle has no regional wall motion abnormalities. There is moderate left ventricular hypertrophy.  3. Right ventricular systolic function is normal. The right ventricular size is normal.  4. Left atrial size was moderately dilated.  5. No color or bubble study done.  6. Right atrial size was moderately dilated.  7. Limited echo known MS/MR no doppler or color flow done . The mitral valve is degenerative. No evidence of mitral valve regurgitation. No evidence of mitral stenosis. Moderate mitral annular calcification.  8. Limited echo no doppler / color done known AS/AR . The aortic valve is tricuspid. There is moderate calcification of the aortic valve. There is moderate thickening of the aortic valve. Aortic valve regurgitation is not visualized. No aortic stenosis is present.  9. The inferior vena cava is dilated in size with >50% respiratory variability, suggesting right atrial pressure of 8 mmHg. FINDINGS  Left Ventricle: Left ventricular ejection fraction, by estimation, is 60 to 65%. The left ventricle has normal function. The left ventricle has no regional wall motion abnormalities. The left ventricular internal cavity size was normal in size. There is  moderate left ventricular hypertrophy. Right Ventricle: The right ventricular size is normal. No increase in right ventricular wall thickness. Right ventricular systolic function is normal. Left Atrium: Left atrial size was moderately dilated. Right Atrium: Right atrial size was moderately dilated. Pericardium: There is no evidence of pericardial effusion. Mitral Valve: Limited echo known MS/MR no doppler or color flow done. The mitral valve is degenerative in appearance. There is moderate thickening of the mitral valve leaflet(s). There is moderate calcification of the mitral valve  leaflet(s). Moderate mitral annular calcification. No evidence of mitral valve stenosis. Tricuspid Valve: The tricuspid valve is normal in structure. Tricuspid valve regurgitation is not demonstrated. No evidence of tricuspid stenosis. Aortic Valve: Limited echo no doppler / color done known AS/AR. The aortic valve is tricuspid. There is moderate calcification of the aortic valve. There is moderate thickening of the aortic valve. Aortic valve regurgitation is not visualized. No aortic stenosis is present. Pulmonic Valve: The pulmonic valve was normal in structure. Pulmonic valve regurgitation is not visualized. No evidence of pulmonic stenosis. Aorta: The aortic root is normal in size and structure. Venous: The inferior vena cava is dilated in size with greater than 50% respiratory variability, suggesting right atrial pressure of 8 mmHg. IAS/Shunts: The interatrial septum was not well visualized. LEFT VENTRICLE PLAX 2D LVIDd:         4.00 cm LVIDs:         2.20 cm LV PW:         1.00 cm LV IVS:        1.40 cm LVOT diam:     1.80 cm LVOT Area:     2.54 cm  LEFT ATRIUM         Index LA diam:    4.50 cm 2.67 cm/m   AORTA Ao Root diam: 3.10 cm  SHUNTS Systemic Diam: 1.80 cm Jenkins Rouge MD Electronically signed by Jenkins Rouge MD Signature Date/Time: 02/18/2021/4:45:17 PM    Final     Procedures .Critical Care Performed by: Noemi Chapel, MD Authorized by: Noemi Chapel, MD   Critical care provider statement:    Critical care time (minutes):  30   Critical care time was exclusive of:  Separately billable procedures and treating other patients and teaching time   Critical care  was necessary to treat or prevent imminent or life-threatening deterioration of the following conditions:  CNS failure or compromise   Critical care was time spent personally by me on the following activities:  Development of treatment plan with patient or surrogate, discussions with consultants, evaluation of patient's response to  treatment, examination of patient, ordering and review of laboratory studies, ordering and review of radiographic studies, ordering and performing treatments and interventions, pulse oximetry, re-evaluation of patient's condition, review of old charts and obtaining history from patient or surrogate   I assumed direction of critical care for this patient from another provider in my specialty: no     Care discussed with: admitting provider   Comments:         Medications Ordered in ED Medications  iohexol (OMNIPAQUE) 350 MG/ML injection 75 mL (has no administration in time range)    ED Course  I have reviewed the triage vital signs and the nursing notes.  Pertinent labs & imaging results that were available during my care of the patient were reviewed by me and considered in my medical decision making (see chart for details).    MDM Rules/Calculators/A&P                          The patient definitely meets criteria for a code stroke, lasting normal at 10:10 PM, upon arrival the patient was immediately evaluated and sent to the [facility].  She will need a CT angiogram, consult with neurology has been initiated and I have spoke with the triage nurse at 10:50 PM.  The patient will likely need a high level of care and is critically ill with what appears to be an acute code stroke.  Will need to rule out hemorrhage with CT and LVO with CTA.  This patient presents to the ED for concern of weakness, this involves an extensive number of treatment options, and is a complaint that carries with it a high risk of complications and morbidity.  The differential diagnosis includes acute ischemic stroke, hemorrhage, Bell's palsy though this seems less likely given the weakness of the arm and the leg.  Would also consider infection or metabolic abnormalities.   Co morbidities that complicate the patient evaluation  Hypertension, diabetes, chronic anticoagulation on warfarin   Additional history  obtained:  Additional history obtained from the spouse who arrives and gives further information about onset and timing as well as the medical record which I thoroughly reviewed External records from outside source obtained and reviewed including prior echocardiogram showing aortic stenosis   Lab Tests:  I Ordered, and personally interpreted labs.  The pertinent results include: Laboratory work-up without any significant acute findings, negative for COVID and the flu, CBC and metabolic panels are unremarkable   Imaging Studies ordered:  I ordered imaging studies including CT scans of the brain without and with contrast including angiogram of the head and neck I independently visualized and interpreted imaging which showed that there was an acute occlusion of the right anterior cerebral artery in the proximal A2 segments with distal reconstitution I agree with the radiologist interpretation   Cardiac Monitoring:  The patient was maintained on a cardiac monitor.  I personally viewed and interpreted the cardiac monitored which showed an underlying rhythm of: Normal sinus rhythm   Medicines ordered and prescription drug management:  I have reviewed the patients home medicines and have made adjustments as needed   Test Considered:  MRI however at this  time MRI is not available and the patient will need to be admitted to the hospital, also considered acute transfer for thrombectomy however the patient symptoms resolved after CT scan and returned back to her baseline   Critical Interventions:  Code stroke was activated immediately upon the patient's arrival CT scans and angiograms performed immediately Labs and x-rays performed immediately as well as EKG and COVID testing Involvement of neurospecialists to help and make advanced care decisions   Consultations Obtained:  I requested consultation with the neuro hospitalist,  and discussed lab and imaging findings as well as  pertinent plan - they recommend: Admission to the hospital given that the patient's symptoms resolved while in the emergency department and no longer qualify for thrombectomy   Problem List / ED Course:  Hypertension Diabetes Acute ischemic stroke\TIA   Reevaluation:  After the interventions noted above, I reevaluated the patient and found that they have :improved      Dispostion:  After consideration of the diagnostic results and the patients response to treatment, I feel that the patent would benefit from admission to the hospital, acute stroke center.       Final Clinical Impression(s) / ED Diagnoses Final diagnoses:  TIA (transient ischemic attack)     Noemi Chapel, MD 02/19/21 1054

## 2021-02-17 NOTE — ED Notes (Signed)
Placed pt on a purewick and gave pt a warm blanket.

## 2021-02-17 NOTE — ED Notes (Signed)
Patient transported to CT 

## 2021-02-17 NOTE — ED Triage Notes (Signed)
Pt arrive via [facility]. Per ems pt was found in the floor. Left facial droop and left arm weakness noted with AMS.

## 2021-02-17 NOTE — ED Provider Notes (Addendum)
°  Provider Note MRN:  [id number]  Arrival date & time: 02/17/21    ED Course and Medical Decision Making  Assumed care from Dr. Sabra Heck at shift change.  Acute hemiplegia and code stroke alert, resolution of symptoms at this time.  Back to baseline per teleneurology.  Will admit for TIA work-up.  12 AM update: Patient CTA revealing an ACA occlusion per radiology.  Patient quickly reassessed and remains at her baseline, no numbness or weakness to the arms or legs, no facial droop, NIH of 0.  We discussed the case with teleneurology.  Patient became asymptomatic after imaging, suspect that the clot dissolved or cleared in some way in the interim.  Given her reassuring neurological exam, no indication for thrombectomy.  Will continue very close monitoring and frequent neurochecks, admit to hospitalist service for TIA work-up.  .Critical Care Performed by: Maudie Flakes, MD Authorized by: Maudie Flakes, MD   Critical care provider statement:    Critical care time (minutes):  35   Critical care was necessary to treat or prevent imminent or life-threatening deterioration of the following conditions: Concern for acute ischemic stroke, initiation of code stroke protocol.   Critical care was time spent personally by me on the following activities:  Development of treatment plan with patient or surrogate, discussions with consultants, evaluation of patient's response to treatment, examination of patient, ordering and review of laboratory studies, ordering and review of radiographic studies, ordering and performing treatments and interventions, pulse oximetry, re-evaluation of patient's condition and review of old charts  Final Clinical Impressions(s) / ED Diagnoses     ICD-10-CM   1. TIA (transient ischemic attack)  G45.9       ED Discharge Orders     None       Discharge Instructions   None     Barth Kirks. [place] Small, [place] [email or web address]    Maudie Flakes, MD 02/17/21 1655    Maudie Flakes, MD 02/18/21 [id number]

## 2021-02-17 NOTE — ED Notes (Signed)
CODE STROKE PAGED 

## 2021-02-18 ENCOUNTER — Other Ambulatory Visit (HOSPITAL_COMMUNITY): Payer: Self-pay | Admitting: *Deleted

## 2021-02-18 ENCOUNTER — Observation Stay (HOSPITAL_BASED_OUTPATIENT_CLINIC_OR_DEPARTMENT_OTHER): Payer: Medicare HMO

## 2021-02-18 ENCOUNTER — Observation Stay (HOSPITAL_COMMUNITY): Payer: Medicare HMO

## 2021-02-18 DIAGNOSIS — I639 Cerebral infarction, unspecified: Secondary | ICD-10-CM | POA: Diagnosis not present

## 2021-02-18 DIAGNOSIS — E782 Mixed hyperlipidemia: Secondary | ICD-10-CM | POA: Diagnosis not present

## 2021-02-18 DIAGNOSIS — G459 Transient cerebral ischemic attack, unspecified: Secondary | ICD-10-CM

## 2021-02-18 DIAGNOSIS — I482 Chronic atrial fibrillation, unspecified: Secondary | ICD-10-CM

## 2021-02-18 DIAGNOSIS — I350 Nonrheumatic aortic (valve) stenosis: Secondary | ICD-10-CM | POA: Diagnosis not present

## 2021-02-18 DIAGNOSIS — R29810 Facial weakness: Secondary | ICD-10-CM | POA: Diagnosis not present

## 2021-02-18 DIAGNOSIS — E119 Type 2 diabetes mellitus without complications: Secondary | ICD-10-CM

## 2021-02-18 DIAGNOSIS — I10 Essential (primary) hypertension: Secondary | ICD-10-CM

## 2021-02-18 LAB — ECHOCARDIOGRAM LIMITED
Height: 61 in
S' Lateral: 2.2 cm
Weight: 2437.41 [oz_av]

## 2021-02-18 LAB — COMPREHENSIVE METABOLIC PANEL
ALT: 20 U/L (ref 0–44)
AST: 30 U/L (ref 15–41)
Albumin: 3.5 g/dL (ref 3.5–5.0)
Alkaline Phosphatase: 43 U/L (ref 38–126)
Anion gap: 8 (ref 5–15)
BUN: 15 mg/dL (ref 8–23)
CO2: 25 mmol/L (ref 22–32)
Calcium: 9.3 mg/dL (ref 8.9–10.3)
Chloride: 106 mmol/L (ref 98–111)
Creatinine, Ser: 0.55 mg/dL (ref 0.44–1.00)
GFR, Estimated: >60 mL/min (ref >60)
Glucose, Bld: 110 mg/dL — ABNORMAL HIGH (ref 70–99)
Potassium: 3.9 mmol/L (ref 3.5–5.1)
Sodium: 139 mmol/L (ref 135–145)
Total Bilirubin: 0.3 mg/dL (ref 0.3–1.2)
Total Protein: 6.5 g/dL (ref 6.5–8.1)

## 2021-02-18 LAB — RAPID URINE DRUG SCREEN, HOSP PERFORMED
Amphetamines: NOT DETECTED
Barbiturates: NOT DETECTED
Benzodiazepines: NOT DETECTED
Cocaine: NOT DETECTED
Opiates: NOT DETECTED
Tetrahydrocannabinol: NOT DETECTED

## 2021-02-18 LAB — LIPID PANEL
Cholesterol: 152 mg/dL (ref 0–200)
HDL: 52 mg/dL (ref >40)
LDL Cholesterol: 86 mg/dL (ref 0–99)
Total CHOL/HDL Ratio: 2.9 RATIO
Triglycerides: 69 mg/dL (ref <150)
VLDL: 14 mg/dL (ref 0–40)

## 2021-02-18 LAB — MAGNESIUM: Magnesium: 1.9 mg/dL (ref 1.7–2.4)

## 2021-02-18 LAB — URINALYSIS, ROUTINE W REFLEX MICROSCOPIC
Bilirubin Urine: NEGATIVE
Glucose, UA: NEGATIVE mg/dL
Ketones, ur: NEGATIVE mg/dL
Nitrite: POSITIVE — AB
Protein, ur: NEGATIVE mg/dL
Specific Gravity, Urine: 1.039 — ABNORMAL HIGH (ref 1.005–1.030)
pH: 6 (ref 5.0–8.0)

## 2021-02-18 LAB — CBC
HCT: 40.2 % (ref 36.0–46.0)
Hemoglobin: 12.6 g/dL (ref 12.0–15.0)
MCH: 29.2 pg (ref 26.0–34.0)
MCHC: 31.3 g/dL (ref 30.0–36.0)
MCV: 93.1 fL (ref 80.0–100.0)
Platelets: 195 10*3/uL (ref 150–400)
RBC: 4.32 MIL/uL (ref 3.87–5.11)
RDW: 13.6 % (ref 11.5–15.5)
WBC: 8.9 10*3/uL (ref 4.0–10.5)
nRBC: 0 % (ref 0.0–0.2)

## 2021-02-18 LAB — PROTIME-INR
INR: 1.2 (ref 0.8–1.2)
INR: 1.2 (ref 0.8–1.2)
Prothrombin Time: 15.1 seconds (ref 11.4–15.2)
Prothrombin Time: 15 seconds (ref 11.4–15.2)

## 2021-02-18 LAB — HEMOGLOBIN A1C
Hgb A1c MFr Bld: 5.7 % — ABNORMAL HIGH (ref 4.8–5.6)
Mean Plasma Glucose: 116.89 mg/dL

## 2021-02-18 LAB — PHOSPHORUS: Phosphorus: 3.4 mg/dL (ref 2.5–4.6)

## 2021-02-18 LAB — APTT: aPTT: 29 seconds (ref 24–36)

## 2021-02-18 MED ORDER — ASPIRIN EC 81 MG PO TBEC
81.0000 mg | DELAYED_RELEASE_TABLET | Freq: Every day | ORAL | Status: DC
  Administered 2021-02-18: 10:00:00 81 mg via ORAL
  Filled 2021-02-18: qty 1

## 2021-02-18 MED ORDER — WARFARIN - PHARMACIST DOSING INPATIENT: Freq: Every day | Status: DC

## 2021-02-18 MED ORDER — SIMVASTATIN 20 MG PO TABS: 40.0000 mg | ORAL_TABLET | Freq: Every day | ORAL | Status: DC

## 2021-02-18 MED ORDER — ATORVASTATIN CALCIUM 40 MG PO TABS: 40.0000 mg | ORAL_TABLET | Freq: Every day | ORAL | Status: DC

## 2021-02-18 MED ORDER — WARFARIN SODIUM 2.5 MG PO TABS: 2.5000 mg | ORAL_TABLET | Freq: Once | ORAL | Status: DC

## 2021-02-18 MED ORDER — WARFARIN SODIUM 1 MG PO TABS: 1.0000 mg | ORAL_TABLET | ORAL | Status: DC

## 2021-02-18 NOTE — Consult Note (Signed)
I connected with  Anne Wolf on 02/18/21 by a video enabled telemedicine application and verified that I am speaking with the correct person using two identifiers.   I discussed the limitations of evaluation and management by telemedicine. The patient expressed understanding and agreed to proceed.  Location of patient: [facility] Location of physician: [facility]  Neurology Consultation Reason for Consult: Acute ischemic stroke Referring Physician: Dr. Heath Lark  CC: Left-sided weakness, slurred speech, gaze deviation  History is obtained from: Patient, chart review  HPI: Anne Wolf is a 87 y.o. female with past medical history of atrial fibrillation on warfarin, hypertension, hyponatremia, diabetes who presented as a stroke alert for left-sided weakness, slurred speech and right gaze deviation.  She was evaluated by telestroke however her symptoms had resolved by then.  NIHSS was 0.  MRI brain was performed this morning which showed acute ischemic infarct in right basal ganglia as well as to subacute infarcts in left parietal region.  Neurology was consulted for further recommendations.  Patient states her symptoms have completely resolved and she is back to baseline now.  Denies ever having similar symptoms in the past.  States she was off of Coumadin and was just restarted on it yesterday because of cardiology work-up.  Patient symptoms started at home Last known normal: 02/17/2021 at 2210 tPA was not administered as NIHSS is 0 Thrombectomy was not performed as NIHSS 0   ROS: All other systems reviewed and negative except as noted in the HPI.   Past Medical History:  Diagnosis Date   Aortic stenosis    mild; mild MR; slightly increased pulmonary artery pressure with mild RVH; normal LV-2010   Arthritis    Atrial fibrillation (HCC)    chronic anticoagulation; adequate HR control on minimal AV nodal blocking medication    Cancer ([place])     skin cancers removed from face   CHF (congestive heart failure) ([place])    Diabetes mellitus without complication ([place])    2   Dysrhythmia    A FIB   Fasting hyperglycemia    + microalbuminuria; A1c of 6.5% in 11/2010   Hematochezia 01/2009   01/2009-presumed ischemic colitis; history of diverticulosis   History of kidney stones    Hx of adenomatous colonic polyps 2005   adenomatous polyp; negative colonoscopy in 2009   Hyperlipidemia     Family History  Problem Relation Age of Onset   Cancer Father    Arrhythmia Sister        Atrial fibrillation    Social History:  reports that she has never smoked. She has never used smokeless tobacco. She reports that she does not drink alcohol and does not use drugs.   Medications Prior to Admission  Medication Sig Dispense Refill Last Dose   alendronate (FOSAMAX) 70 MG tablet Take 70 mg by mouth once a week.      Biotin 5000 MCG CAPS Take 5,000 mcg by mouth daily.      calcium carbonate (OSCAL) 1500 (600 Ca) MG TABS tablet Take 600 mg of elemental calcium by mouth daily.      cholecalciferol (VITAMIN D) 1000 units tablet Take 1,000 Units by mouth at bedtime.      Co-Enzyme Q-10 100 MG CAPS Take 100 mg by mouth at bedtime.      lisinopril (PRINIVIL,ZESTRIL) 10 MG tablet Take 10 mg by mouth at bedtime.      loratadine (CLARITIN) 10 MG tablet Take 10 mg by mouth  at bedtime.      metFORMIN (GLUCOPHAGE) 500 MG tablet Take 500 mg by mouth 2 (two) times daily with a meal.       metoprolol succinate (TOPROL-XL) 25 MG 24 hr tablet Take 25 mg by mouth at bedtime.      Multiple Vitamins-Minerals (CENTRUM SILVER) tablet Take 1 tablet by mouth at bedtime.       simvastatin (ZOCOR) 40 MG tablet Take 40 mg by mouth at bedtime.        warfarin (COUMADIN) 2 MG tablet Take 1 tablet daily except 1/2 tablet on Wednesdays and Saturdays or as directed (Patient taking differently: Take 1-2 mg by mouth See admin instructions. Take 1 mg on Wednesday and Saturday.  All the other days take 2 mg in the evening) 90 tablet 3       Exam: Current vital signs: BP 117/61 (BP Location: Right Arm)    Pulse 85    Temp 98.1 F (36.7 C) (Oral)    Resp 18    Ht 5\' 1"  (1.549 m)    Wt 69.1 kg    SpO2 98%    BMI 28.78 kg/m  Vital signs in last 24 hours: Temp:  [97.6 F (36.4 C)-98.1 F (36.7 C)] 98.1 F (36.7 C) (12/30 0357) Pulse Rate:  [80-111] 85 (12/30 0357) Resp:  [10-22] 18 (12/30 0357) BP: (82-137)/(50-77) 117/61 (12/30 0357) SpO2:  [92 %-98 %] 98 % (12/30 0357) Weight:  [69.1 kg] 69.1 kg (12/29 2345)   Physical Exam  Constitutional: Appears well-developed and well-nourished.  Psych: Affect appropriate to situation Eyes: No scleral injection HENT: No OP obstrucion Head: Normocephalic.  Cardiovascular: Normal rate and regular rhythm.  Respiratory: Effort normal, non-labored breathing Neuro: AOx3, cranial nerves II through grossly intact, antigravity strength in all 4 extremities without drift, finger-to-nose intact bilaterally   I have reviewed labs in epic and the results pertinent to this consultation are: CBC:  Recent Labs  Lab 02/17/21 2322 02/17/21 2327 02/18/21 0507  WBC 8.1  --  8.9  NEUTROABS 4.0  --   --   HGB 13.7 13.9 12.6  HCT 42.4 41.0 40.2  MCV 93.8  --  93.1  PLT 207  --  132    Basic Metabolic Panel:  Lab Results  Component Value Date   NA 139 02/18/2021   K 3.9 02/18/2021   CO2 25 02/18/2021   GLUCOSE 110 (H) 02/18/2021   BUN 15 02/18/2021   CREATININE 0.55 02/18/2021   CALCIUM 9.3 02/18/2021   GFRNONAA >60 02/18/2021   GFRAA >60 11/28/2017   Lipid Panel:  Lab Results  Component Value Date   LDLCALC 108 03/24/2008   [place] 108 03/24/2008   HgbA1c:  Lab Results  Component Value Date   HGBA1C 5.8 (H) 08/14/2016   Urine Drug Screen:     Component Value Date/Time   LABOPIA NONE DETECTED 02/18/2021 0500   COCAINSCRNUR NONE DETECTED 02/18/2021 0500   LABBENZ NONE DETECTED 02/18/2021 0500   AMPHETMU  NONE DETECTED 02/18/2021 0500   THCU NONE DETECTED 02/18/2021 0500   LABBARB NONE DETECTED 02/18/2021 0500    Alcohol Level     Component Value Date/Time   ETH <10 02/17/2021 2252     I have reviewed the images obtained: CT head without contrast 02/17/2021: No acute abnormality CTA head and neck 02/17/2021: Occlusion of right anterior cerebral artery A1 and proximal A2 segments with distal reconstitution.    MRI brain without contrast 02/18/2021: Acute right basal ganglia infarction.  Tiny left parietal white matter infarcts, favored subacute.     ASSESSMENT/PLAN: 87 year old female who presented with left-sided weakness, slurred speech and right gaze deviation.  Acute ischemic stroke, right basal ganglia -Etiology: Cardioembolic in the setting of subtherapeutic INR -Patient's INR was subtherapeutic at 1.3 on arrival which was likely the etiology for stroke  Recommendations -TTE ordered and pending -We will check LDL and A1c -Okay to resume Coumadin -Not starting aspirin as patient is already on Coumadin -Will start atorvastatin 40 mg daily for secondary stroke prevention -Permissive hypertension for 24 hours followed by normotension -Stat CT head for any acute neurological change -PT/OT, speech eval -Neuro follow-up in 8 to 12 weeks, likely with Dr. Merlene Laughter   Thank you for allowing [place] to participate in the care of this patient. If you have any further questions, please contact  me or neurohospitalist.   Zeb Comfort [facility] neurohospitalist

## 2021-02-18 NOTE — Evaluation (Signed)
Occupational Therapy Evaluation Patient Details Name: Anne Wolf MRN: [id number] DOB: February 05, 1934 Today's Date: 02/18/2021   History of Present Illness Anne Wolf is an 87 y.o. female with medical history significant for aortic stenosis, persistent A-Fib on warfarin, hypertension, T2DM, hyperlipidemia who presents to the emergency department EMS due to strokelike symptoms. Patient was last seen normal at 10:10 PM yesterday sitting on a couch in the living room and doing a crossword puzzle by husband who left the living room, on returning about 10 minutes later, he noted that patient was trying to reach for something on the ground, but also noted left-sided weakness and left facial droop which was new inpatient.  EMS was activated, on arrival of EMS team, patient was noted to be unable to walk and have difficulty with speaking and rightward gaze.   Clinical Impression   Pt agreeable to OT/PT co-evaluation. Pt appears to be at or near baseline levels. Pt demonstrates general B UE weakness with independent level of functioning for lower body dressing and overall ADL's per clinical judgement. Pt demonstrates mild bilateral peripheral vision deficits, likely baseline. Pt is not recommended for further acute OT and will be discharged to care of nursing staff for remaining length of stay.      Recommendations for follow up therapy are one component of a multi-disciplinary discharge planning process, led by the attending physician.  Recommendations may be updated based on patient status, additional functional criteria and insurance authorization.   Follow Up Recommendations  No OT follow up    Assistance Recommended at Discharge PRN  Functional Status Assessment  Patient has not had a recent decline in their functional status  Equipment Recommendations  None recommended by OT    Recommendations for Other Services       Precautions / Restrictions Precautions Precautions:  Fall Restrictions Weight Bearing Restrictions: No      Mobility Bed Mobility Overal bed mobility: Independent                  Transfers Overall transfer level: Independent                        Balance Overall balance assessment: Needs assistance Sitting-balance support: Feet supported Sitting balance-Leahy Scale: Good     Standing balance support: No upper extremity supported Standing balance-Leahy Scale: Good Standing balance comment: good/fair without AD                           ADL either performed or assessed with clinical judgement   ADL Overall ADL's : Independent                                             Vision Baseline Vision/History: 1 Wears glasses Ability to See in Adequate Light: 1 Impaired Patient Visual Report: No change from baseline Vision Assessment?:  (Mild bilateral peripheral deficits. Likely baseline. Good visual tracking and convergence.)                Pertinent Vitals/Pain Pain Assessment: No/denies pain     Hand Dominance Right   Extremity/Trunk Assessment Upper Extremity Assessment Upper Extremity Assessment: Generalized weakness   Lower Extremity Assessment Lower Extremity Assessment: Defer to PT evaluation   Cervical / Trunk Assessment Cervical / Trunk Assessment: Normal   Communication Communication Communication: No  difficulties   Cognition Arousal/Alertness: Awake/alert Behavior During Therapy: WFL for tasks assessed/performed Overall Cognitive Status: Within Functional Limits for tasks assessed                                                        Home Living Family/patient expects to be discharged to:: Private residence Living Arrangements: Spouse/significant other Available Help at Discharge: Family Type of Home: House Home Access: Stairs to enter Technical brewer of Steps: 3 Entrance Stairs-Rails: None Home Layout: Two  level Alternate Level Stairs-Number of Steps: 10-12 Alternate Level Stairs-Rails: Right Bathroom Shower/Tub: Teacher, early years/pre: Standard Bathroom Accessibility: Yes   Home Equipment: None          Prior Functioning/Environment Prior Level of Function : Independent/Modified Independent             Mobility Comments: community ambulator without AD ADLs Comments: Independent for ADL's. Husband Min assist with IADL's.                      OT Goals(Current goals can be found in the care plan section) Acute Rehab OT Goals Patient Stated Goal: return home                   Co-evaluation PT/OT/SLP Co-Evaluation/Treatment: Yes Reason for Co-Treatment: To address functional/ADL transfers PT goals addressed during session: Mobility/safety with mobility;Balance;Strengthening/ROM OT goals addressed during session: ADL's and self-care      AM-PAC OT "6 Clicks" Daily Activity     Outcome Measure Help from another person eating meals?: None Help from another person taking care of personal grooming?: None Help from another person toileting, which includes using toliet, bedpan, or urinal?: None Help from another person bathing (including washing, rinsing, drying)?: None Help from another person to put on and taking off regular upper body clothing?: None Help from another person to put on and taking off regular lower body clothing?: None 6 Click Score: 24   End of Session    Activity Tolerance: Patient tolerated treatment well Patient left: in bed;with call bell/phone within reach;with family/visitor present  OT Visit Diagnosis: Unsteadiness on feet (R26.81);Muscle weakness (generalized) (M62.81);Other abnormalities of gait and mobility (R26.89)                Time: 7017-7939 OT Time Calculation (min): 21 min Charges:  OT General Charges $OT Visit: 1 Visit OT Evaluation $OT Eval Low Complexity: 1 Low  Chino Sardo OT, MOT  Larey Seat 02/18/2021, 11:27 AM

## 2021-02-18 NOTE — Evaluation (Signed)
Physical Therapy Evaluation Patient Details Name: Anne Wolf MRN: [id number] DOB: 1933/08/16 Today's Date: 02/18/2021  History of Present Illness  Anne Wolf is an 87 y.o. female with medical history significant for aortic stenosis, persistent A-Fib on warfarin, hypertension, T2DM, hyperlipidemia who presents to the emergency department EMS due to strokelike symptoms. Patient was last seen normal at 10:10 PM yesterday sitting on a couch in the living room and doing a crossword puzzle by husband who left the living room, on returning about 10 minutes later, he noted that patient was trying to reach for something on the ground, but also noted left-sided weakness and left facial droop which was new inpatient.  EMS was activated, on arrival of EMS team, patient was noted to be unable to walk and have difficulty with speaking and rightward gaze.   Clinical Impression  Patient functioning near baseline for mobility and gait. Patient demonstrating BLE strength WFL. She is able to ambulate without AD with minimal unsteadiness intermittently. Patient completes stairs with reciprocal pattern with unilateral UE support. She is returned to bed at end of session with family present. Patient discharged to care of nursing for ambulation daily as tolerated for length of stay.        Recommendations for follow up therapy are one component of a multi-disciplinary discharge planning process, led by the attending physician.  Recommendations may be updated based on patient status, additional functional criteria and insurance authorization.  Follow Up Recommendations No PT follow up    Assistance Recommended at Discharge PRN  Functional Status Assessment Patient has had a recent decline in their functional status and demonstrates the ability to make significant improvements in function in a reasonable and predictable amount of time.  Equipment Recommendations  None recommended by PT     Recommendations for Other Services       Precautions / Restrictions Precautions Precautions: Fall Restrictions Weight Bearing Restrictions: No      Mobility  Bed Mobility Overal bed mobility: Independent                  Transfers Overall transfer level: Independent                      Ambulation/Gait Ambulation/Gait assistance: Modified independent (Device/Increase time) Gait Distance (Feet): 150 Feet Assistive device: None   Gait velocity: decreased     General Gait Details: intermittent unsteadiness without loss of balance, slow, labored  Stairs Stairs: Yes Stairs assistance: Modified independent (Device/Increase time) Stair Management: One rail Right;Alternating pattern Number of Stairs: 10    Wheelchair Mobility    Modified Rankin (Stroke Patients Only)       Balance Overall balance assessment: Needs assistance Sitting-balance support: Feet supported Sitting balance-Leahy Scale: Good     Standing balance support: No upper extremity supported Standing balance-Leahy Scale: Good Standing balance comment: good/fair without AD                             Pertinent Vitals/Pain Pain Assessment: No/denies pain    Home Living Family/patient expects to be discharged to:: Private residence Living Arrangements: Spouse/significant other Available Help at Discharge: Family Type of Home: House Home Access: Stairs to enter Entrance Stairs-Rails: None Entrance Stairs-Number of Steps: 3 Alternate Level Stairs-Number of Steps: 10-12 Home Layout: Two level        Prior Function Prior Level of Function : Independent/Modified Independent  Mobility Comments: community ambulator without AD       Hand Dominance        Extremity/Trunk Assessment   Upper Extremity Assessment Upper Extremity Assessment: Defer to OT evaluation    Lower Extremity Assessment Lower Extremity Assessment: Overall WFL for tasks  assessed    Cervical / Trunk Assessment Cervical / Trunk Assessment: Normal  Communication   Communication: No difficulties  Cognition Arousal/Alertness: Awake/alert Behavior During Therapy: WFL for tasks assessed/performed Overall Cognitive Status: Within Functional Limits for tasks assessed                                          General Comments      Exercises     Assessment/Plan    PT Assessment Patient does not need any further PT services  PT Problem List         PT Treatment Interventions      PT Goals (Current goals can be found in the Care Plan section)  Acute Rehab PT Goals Patient Stated Goal: Return home PT Goal Formulation: With patient Time For Goal Achievement: 02/18/21 Potential to Achieve Goals: Good    Frequency     Barriers to discharge        Co-evaluation PT/OT/SLP Co-Evaluation/Treatment: Yes Reason for Co-Treatment: To address functional/ADL transfers PT goals addressed during session: Mobility/safety with mobility;Balance;Strengthening/ROM OT goals addressed during session: ADL's and self-care       AM-PAC PT "6 Clicks" Mobility  Outcome Measure Help needed turning from your back to your side while in a flat bed without using bedrails?: None Help needed moving from lying on your back to sitting on the side of a flat bed without using bedrails?: None Help needed moving to and from a bed to a chair (including a wheelchair)?: None Help needed standing up from a chair using your arms (e.g., wheelchair or bedside chair)?: None Help needed to walk in hospital room?: None Help needed climbing 3-5 steps with a railing? : None 6 Click Score: 24    End of Session   Activity Tolerance: Patient tolerated treatment well Patient left: in bed;with family/visitor present Nurse Communication: Mobility status PT Visit Diagnosis: Unsteadiness on feet (R26.81);Other abnormalities of gait and mobility (R26.89);Other symptoms and  signs involving the nervous system (R29.898)    Time: 9163-8466 PT Time Calculation (min) (ACUTE ONLY): 18 min   Charges:   PT Evaluation $PT Eval Low Complexity: 1 Low          10:22 AM, 02/18/21 Mearl Latin PT, DPT Physical Therapist at [facility]

## 2021-02-18 NOTE — Discharge Summary (Signed)
Physician Discharge Summary  Anne Wolf EVO:[id number] DOB: 11-26-1933 DOA: 02/17/2021  PCP: Asencion Noble, MD  Admit date: 02/17/2021  Discharge date: 02/18/2021  Admitted From:Home  Disposition:  Home  Recommendations for Outpatient Follow-up:  Follow up with PCP in 1-2 weeks Continue on Coumadin and statin as prior Follow-up with Dr. Merlene Laughter as recommended in 2-3 months  Home Health: None  Equipment/Devices: None  Discharge Condition:Stable  CODE STATUS: Full  Diet recommendation: Heart Healthy  Brief/Interim Summary: Anne Wolf is an 87 y.o. female with medical history significant for aortic stenosis, persistent A-Fib on warfarin, hypertension, T2DM, hyperlipidemia who presents to the emergency department EMS due to strokelike symptoms.  She was admitted for suspected TIA/CVA, and had complete resolution of her symptoms shortly after arrival to the ED.  She states that she was intermittently on Coumadin recently due to cardiology work-up for a valve replacement that she will be having in January 2023.  She underwent brain MRI demonstrating acute right basal ganglia CVA and her INR was noted to be subtherapeutic at 1.3.  She was noted to have large vessel occlusion to her right anterior cerebral artery that did not require any thrombolysis due to her symptom resolution.  2D echocardiogram as noted below.  Per neurology, recommendation was to resume her home Coumadin as well as her statin.  She does not require any aspirin due to the fact that she is already on Coumadin.  She has done well after PT evaluation and will be going home.  She has been recommended to follow-up with neurology outpatient in the next 2-3 months.  Discharge Diagnoses:  Principal Problem:   Transient ischemic attack (TIA) Active Problems:   Mixed hyperlipidemia   Atrial fibrillation, chronic (HCC)   Aortic stenosis   Essential hypertension   Type 2 diabetes mellitus  ([place])  Principal discharge diagnosis: Acute ischemic right basal ganglia CVA in the setting of subtherapeutic INR.  Discharge Instructions   Allergies as of 02/18/2021       Reactions   Cat Hair Extract Other (See Comments)   Red eyes and Sneezing Also allergic to Dogs        Medication List     TAKE these medications    alendronate 70 MG tablet Commonly known as: FOSAMAX Take 70 mg by mouth once a week.   Biotin 5000 MCG Caps Take 5,000 mcg by mouth daily.   calcium carbonate 1500 (600 Ca) MG Tabs tablet Commonly known as: OSCAL Take 600 mg of elemental calcium by mouth daily.   Centrum Silver tablet Take 1 tablet by mouth at bedtime.   cholecalciferol 1000 units tablet Commonly known as: VITAMIN D Take 1,000 Units by mouth at bedtime.   Co-Enzyme Q-10 100 MG Caps Take 100 mg by mouth at bedtime.   lisinopril 10 MG tablet Commonly known as: ZESTRIL Take 10 mg by mouth at bedtime.   loratadine 10 MG tablet Commonly known as: CLARITIN Take 10 mg by mouth at bedtime.   metFORMIN 500 MG tablet Commonly known as: GLUCOPHAGE Take 500 mg by mouth 2 (two) times daily with a meal.   metoprolol succinate 25 MG 24 hr tablet Commonly known as: TOPROL-XL Take 25 mg by mouth at bedtime.   simvastatin 40 MG tablet Commonly known as: ZOCOR Take 40 mg by mouth at bedtime.   warfarin 2 MG tablet Commonly known as: COUMADIN Take as directed. If you are unsure how to take this medication, talk to your nurse or doctor.  Original instructions: Take 1 tablet daily except 1/2 tablet on Wednesdays and Saturdays or as directed What changed:  how much to take how to take this when to take this additional instructions        Follow-up Information     Asencion Noble, MD. Schedule an appointment as soon as possible for a visit in 1 week(s).   Specialty: Internal Medicine Contact information: [address] [place] [address] [phone]          Phillips Odor, MD. Schedule an appointment as soon as possible for a visit in 2 month(s).   Specialty: Neurology Contact information: [address] [place] [address] [phone]                Allergies  Allergen Reactions   Cat Hair Extract Other (See Comments)    Red eyes and Sneezing Also allergic to Dogs    Consultations: Neurology   Procedures/Studies: CT Angio Head W or Wo Contrast  Result Date: 02/17/2021 CLINICAL DATA:  Left facial droop and left arm weakness EXAM: CT HEAD WITHOUT CONTRAST CT ANGIOGRAPHY OF THE HEAD AND NECK TECHNIQUE: Contiguous axial images were obtained from the base of the skull through the vertex without intravenous contrast. Multidetector CT imaging of the head and neck was performed using the standard protocol during bolus administration of intravenous contrast. Multiplanar CT image reconstructions and MIPs were obtained to evaluate the vascular anatomy. Carotid stenosis measurements (when applicable) are obtained utilizing NASCET criteria, using the distal internal carotid diameter as the denominator. CONTRAST:  67mL OMNIPAQUE IOHEXOL 350 MG/ML SOLN COMPARISON:  None. FINDINGS: CT HEAD FINDINGS Brain: There is no mass, hemorrhage or extra-axial collection. The size and configuration of the ventricles and extra-axial CSF spaces are normal. The brain parenchyma is normal, without evidence of acute or chronic infarction. Vascular: No abnormal hyperdensity of the major intracranial arteries or dural venous sinuses. No intracranial atherosclerosis. Skull: The visualized skull base, calvarium and extracranial soft tissues are normal. Sinuses/Orbits: No fluid levels or advanced mucosal thickening of the visualized paranasal sinuses. No mastoid or middle ear effusion. The orbits are normal. ASPECTS ([place] Stroke Program Early CT Score) - Ganglionic level infarction (caudate, lentiform nuclei, internal capsule, insula, M1-M3 cortex): 7 - Supraganglionic  infarction (M4-M6 cortex): 3 Total score (0-10 with 10 being normal): 10 CTA NECK FINDINGS SKELETON: There is no bony spinal canal stenosis. No lytic or blastic lesion. OTHER NECK: Normal pharynx, larynx and major salivary glands. No cervical lymphadenopathy. Unremarkable thyroid gland. UPPER CHEST: No pneumothorax or pleural effusion. No nodules or masses. AORTIC ARCH: There is calcific atherosclerosis of the aortic arch. There is no aneurysm, dissection or hemodynamically significant stenosis of the visualized portion of the aorta. Conventional 3 vessel aortic branching pattern. The visualized proximal subclavian arteries are widely patent. RIGHT CAROTID SYSTEM: Normal without aneurysm, dissection or stenosis. LEFT CAROTID SYSTEM: Normal without aneurysm, dissection or stenosis. VERTEBRAL ARTERIES: Left dominant configuration. Both origins are clearly patent. There is no dissection, occlusion or flow-limiting stenosis to the skull base (V1-V3 segments). CTA HEAD FINDINGS POSTERIOR CIRCULATION: --Vertebral arteries: Normal V4 segments. --Inferior cerebellar arteries: Normal. --Basilar artery: Normal. --Superior cerebellar arteries: Normal. --Posterior cerebral arteries (PCA): Normal. ANTERIOR CIRCULATION: --Intracranial internal carotid arteries: Normal. --Anterior cerebral arteries (ACA): The right anterior cerebral artery A1 segment is occluded. The proximal right A2 segment is also occluded. There is distal reconstitution. --Middle cerebral arteries (MCA): Normal. VENOUS SINUSES: As permitted by contrast timing, patent. ANATOMIC VARIANTS: None Review of the MIP  images confirms the above findings. IMPRESSION: 1. No intracranial hemorrhage.  ASPECTS 10. 2. 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 This is in keeping with left-sided weakness. Critical Value/emergent results were called by telephone at the time of interpretation on 02/17/2021 at 11:40 pm  to Dr. Sedonia Small, Who verbally acknowledged these results. Aortic Atherosclerosis (ICD10-I70.0). Electronically Signed   By: Ulyses Jarred M.D.   On: 02/17/2021 23:42   CT Angio Neck W and/or Wo Contrast  Result Date: 02/17/2021 CLINICAL DATA:  Left facial droop and left arm weakness EXAM: CT HEAD WITHOUT CONTRAST CT ANGIOGRAPHY OF THE HEAD AND NECK TECHNIQUE: Contiguous axial images were obtained from the base of the skull through the vertex without intravenous contrast. Multidetector CT imaging of the head and neck was performed using the standard protocol during bolus administration of intravenous contrast. Multiplanar CT image reconstructions and MIPs were obtained to evaluate the vascular anatomy. Carotid stenosis measurements (when applicable) are obtained utilizing NASCET criteria, using the distal internal carotid diameter as the denominator. CONTRAST:  18mL OMNIPAQUE IOHEXOL 350 MG/ML SOLN COMPARISON:  None. FINDINGS: CT HEAD FINDINGS Brain: There is no mass, hemorrhage or extra-axial collection. The size and configuration of the ventricles and extra-axial CSF spaces are normal. The brain parenchyma is normal, without evidence of acute or chronic infarction. Vascular: No abnormal hyperdensity of the major intracranial arteries or dural venous sinuses. No intracranial atherosclerosis. Skull: The visualized skull base, calvarium and extracranial soft tissues are normal. Sinuses/Orbits: No fluid levels or advanced mucosal thickening of the visualized paranasal sinuses. No mastoid or middle ear effusion. The orbits are normal. ASPECTS ([place] Stroke Program Early CT Score) - Ganglionic level infarction (caudate, lentiform nuclei, internal capsule, insula, M1-M3 cortex): 7 - Supraganglionic infarction (M4-M6 cortex): 3 Total score (0-10 with 10 being normal): 10 CTA NECK FINDINGS SKELETON: There is no bony spinal canal stenosis. No lytic or blastic lesion. OTHER NECK: Normal pharynx, larynx and major salivary  glands. No cervical lymphadenopathy. Unremarkable thyroid gland. UPPER CHEST: No pneumothorax or pleural effusion. No nodules or masses. AORTIC ARCH: There is calcific atherosclerosis of the aortic arch. There is no aneurysm, dissection or hemodynamically significant stenosis of the visualized portion of the aorta. Conventional 3 vessel aortic branching pattern. The visualized proximal subclavian arteries are widely patent. RIGHT CAROTID SYSTEM: Normal without aneurysm, dissection or stenosis. LEFT CAROTID SYSTEM: Normal without aneurysm, dissection or stenosis. VERTEBRAL ARTERIES: Left dominant configuration. Both origins are clearly patent. There is no dissection, occlusion or flow-limiting stenosis to the skull base (V1-V3 segments). CTA HEAD FINDINGS POSTERIOR CIRCULATION: --Vertebral arteries: Normal V4 segments. --Inferior cerebellar arteries: Normal. --Basilar artery: Normal. --Superior cerebellar arteries: Normal. --Posterior cerebral arteries (PCA): Normal. ANTERIOR CIRCULATION: --Intracranial internal carotid arteries: Normal. --Anterior cerebral arteries (ACA): The right anterior cerebral artery A1 segment is occluded. The proximal right A2 segment is also occluded. There is distal reconstitution. --Middle cerebral arteries (MCA): Normal. VENOUS SINUSES: As permitted by contrast timing, patent. ANATOMIC VARIANTS: None Review of the MIP images confirms the above findings. IMPRESSION: 1. No intracranial hemorrhage.  ASPECTS 10. 2. 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 This is in keeping with left-sided weakness. Critical Value/emergent results were called by telephone at the time of interpretation on 02/17/2021 at 11:40 pm to Dr. Sedonia Small, Who verbally acknowledged these results. Aortic Atherosclerosis (ICD10-I70.0). Electronically Signed   By: Ulyses Jarred M.D.   On: 02/17/2021 23:42  MR BRAIN WO CONTRAST  Result Date: 02/18/2021 CLINICAL  DATA:  Found on floor with left facial droop EXAM: MRI HEAD WITHOUT CONTRAST TECHNIQUE: Multiplanar, multiecho pulse sequences of the brain and surrounding structures were obtained without intravenous contrast. COMPARISON:  CT and CTA from yesterday FINDINGS: Brain: Acute infarction involving the right caudate head, anterior right putamen, and intervening white matter. Two tiny subcortical infarcts in the left parietal lobe which is more subacute in appearance based on degree of diffusion restriction. Background age congruent cerebral volume loss. Age normal white matter appearance. Vascular: Normal flow voids. Skull and upper cervical spine: Hyperostosis interna. No focal marrow lesion. Sinuses/Orbits: Bilateral cataract resection IMPRESSION: 1. Acute right basal ganglia infarction. 2. Tiny left parietal white matter infarcts, favored subacute. Electronically Signed   By: Jorje Guild M.D.   On: 02/18/2021 08:41   CARDIAC CATHETERIZATION  Result Date: 02/04/2021   There is moderate aortic valve stenosis. 1.  Patent coronary arteries with no significant stenoses.  Mild calcification noted. 2.  Moderate to severe aortic stenosis with mean transvalvular gradient 28 mmHg and calculated aortic valve area 1.1 cm 3.  Normal right heart hemodynamics Recommend: CT angiography studies, valve team review, consideration of further treatment options might include commercial TAVR versus enrollment in the Progress moderate aortic stenosis TAVR trial.   CT CORONARY MORPH W/CTA COR W/SCORE W/CA W/CM &/OR WO/CM  Addendum Date: 02/15/2021   ADDENDUM REPORT: 02/15/2021 15:08 CLINICAL DATA:  Severe Aortic Stenosis. EXAM: Cardiac TAVR CT TECHNIQUE: A non-contrast, gated CT scan was obtained with axial slices of 3 mm through the heart for aortic valve calcium scoring. A 110 kV retrospective, gated, contrast cardiac scan was obtained. Gantry rotation speed was 250 msecs and collimation was 0.6 mm. Nitroglycerin was not  given. The 3D data set was reconstructed in 5% intervals of the 0-95% of the R-R cycle. Systolic and diastolic phases were analyzed on a dedicated workstation using MPR, MIP, and VRT modes. The patient received 100 cc of contrast. FINDINGS: Image quality: Excellent. Noise artifact is: Limited. Valve Morphology: The aortic valve is tricuspid. There is severe bulky calcification of the [place]. The leaflets demonstrate severely restricted leaflet motion in systole. Aortic Valve Calcium score: 1382 Aortic annular dimension: Phase assessed:  35% Annular area: 393 mm2 Annular perimeter: 71.4 mm Max diameter: 25.5 mm Min diameter: 20.0 mm Membranous septum length: 10.6 mm Annular and subannular calcification: None. Optimal coplanar projection: LAO 1 CAU 0 Coronary Artery Height above Annulus: Left Main: 14.0 mm Right Coronary: 13.2 mm Sinus of Valsalva Measurements: Non-coronary: 30.3 mm Right-coronary: 30.1 mm Left-coronary: 30.3 mm Sinus of Valsalva Height: Non-coronary: 22.4 mm Right-coronary: 19.4 mm Left-coronary: 21.5 mm Sinotubular Junction: 28 mm Ascending Thoracic Aorta: 31 mm Coronary Arteries: Normal coronary origin. Right dominance. The study was performed without use of NTG and is insufficient for plaque evaluation. Please refer to recent cardiac catheterization for coronary assessment. Cardiac Morphology: Right Atrium: Right atrial size is dilated. Contrast reflux into the IVC consistent with elevated RAP. Right Ventricle: The right ventricular cavity is within normal limits. Left Atrium: Left atrial size is dilated. There is mixing artifact in the LAA, cannot exclude thrombus. Left Ventricle: The ventricular cavity size is within normal limits. There are no stigmata of prior infarction. There is no abnormal filling defect. Pulmonary veins: Normal pulmonary venous drainage. Pericardium: Normal thickness with no significant effusion or calcium present. 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 There is moderate calcification of  the PMVL. Extra-cardiac findings: See attached radiology report for non-cardiac structures. IMPRESSION: 1. Tricuspid aortic valve with severe bulky calcification of the NCC. 2. Annular measurements appropriate for 23 mm S3 TAVR (393 mm2). 3. No significant annular or subannular calcifications. 4. Sufficient coronary to annulus distance. 5. Optimal Fluoroscopic Angle for Delivery. 6. 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 There is moderate calcification of the PMVL. 7. There is mixing artifact in the LAA, cannot exclude thrombus. In the setting of rheumatic MV disease, would recommend TEE if clinically indicated to exclude thrombus. 8. Dilated pulmonary artery suggestive of pulmonary hypertension. [place] T. Audie Box, MD Electronically Signed   By: Eleonore Chiquito M.D.   On: 02/15/2021 15:08   Result Date: 02/15/2021 EXAM: OVER-READ INTERPRETATION  CT CHEST The following report is an over-read performed by radiologist Dr. Vinnie Langton of [facility], [place] on 02/15/2021. This over-read does not include interpretation of cardiac or coronary anatomy or pathology. The coronary calcium score/coronary CTA interpretation by the cardiologist is attached. COMPARISON:  None. FINDINGS: Extracardiac findings will be described separately under dictation for contemporaneously obtained CTA chest, abdomen and pelvis. IMPRESSION: Please see separate dictation for contemporaneously obtained CTA chest, abdomen and pelvis dated 02/15/2021 for full description of relevant extracardiac findings. Electronically Signed: By: Vinnie Langton M.D. On: 02/15/2021 11:30   CT ANGIO CHEST AORTA W/CM & OR WO/CM  Result Date: 02/15/2021 CLINICAL DATA:  87 year old female with history of severe aortic stenosis. Preprocedural study prior to potential transcatheter aortic valve replacement (TAVR) procedure. EXAM: CT ANGIOGRAPHY CHEST, ABDOMEN AND PELVIS  TECHNIQUE: Multidetector CT imaging through the chest, abdomen and pelvis was performed using the standard protocol during bolus administration of intravenous contrast. Multiplanar reconstructed images and MIPs were obtained and reviewed to evaluate the vascular anatomy. CONTRAST:  74mL OMNIPAQUE IOHEXOL 350 MG/ML SOLN COMPARISON:  CT the abdomen and pelvis 12/27/2017. FINDINGS: CTA CHEST FINDINGS Cardiovascular: Heart size is mildly enlarged with left atrial dilatation. Filling defect in the left atrial appendage concerning for potential thrombus. There is no significant pericardial fluid, thickening or pericardial calcification. Thickening calcification of the aortic valve. Calcifications of the mitral annulus. Mediastinum/Lymph Nodes: No pathologically enlarged mediastinal or hilar lymph nodes. Esophagus is unremarkable in appearance. No axillary lymphadenopathy. Lungs/Pleura: Scattered areas of scarring or atelectasis in the mid to lower lungs. No acute consolidative airspace disease. No pleural effusions. No suspicious appearing pulmonary nodules or masses are noted. Musculoskeletal/Soft Tissues: There are no aggressive appearing lytic or blastic lesions noted in the visualized portions of the skeleton. Chronic appearing compression fracture of T4 with 60% loss of anterior vertebral body height. CTA ABDOMEN AND PELVIS FINDINGS Hepatobiliary: Liver has a shrunken appearance and nodular contour, indicative of underlying cirrhosis. No suspicious cystic or solid hepatic lesions. No intra or extrahepatic biliary ductal dilatation. Gallbladder is normal in appearance. Pancreas: No pancreatic mass. No pancreatic ductal dilatation. No pancreatic or peripancreatic fluid collections or inflammatory changes. Spleen: Unremarkable. Adrenals/Urinary Tract: Subcentimeter low-attenuation lesion in the upper pole the right kidney, too small to characterize, but statistically likely to represent a cyst. Left kidney and bilateral  adrenal glands are normal in appearance. No hydroureteronephrosis. Urinary bladder is normal in appearance. Stomach/Bowel: The appearance of the stomach is normal. No pathologic dilatation of small bowel or colon. Numerous colonic diverticulae are noted, without surrounding inflammatory changes to suggest an acute diverticulitis at this time. Normal appendix. Vascular/Lymphatic: Vascular findings and measurements pertinent to potential TAVR procedure, as detailed below. Aortic atherosclerosis, without  evidence of aneurysm or dissection in the abdominal or pelvic vasculature. No lymphadenopathy noted in the abdomen or pelvis. Reproductive: Uterus and ovaries are unremarkable in appearance. Other: No significant volume of ascites.  No pneumoperitoneum. Musculoskeletal: There are no aggressive appearing lytic or blastic lesions noted in the visualized portions of the skeleton. VASCULAR MEASUREMENTS PERTINENT TO TAVR: AORTA: Minimal Aortic Diameter-14 x 13 mm Severity of Aortic Calcification-mild to moderate RIGHT PELVIS: Right Common Iliac Artery - Minimal Diameter-9.9 x 10.2 mm Tortuosity-mild Calcification-mild Right External Iliac Artery - Minimal Diameter-6.8 x 6.8 mm Tortuosity-mild-to-moderate Calcification-none Right Common Femoral Artery - Minimal Diameter-7.6 x 7.7 mm Tortuosity-mild Calcification-mild LEFT PELVIS: Left Common Iliac Artery - Minimal Diameter-10.0 x 10.3 mm Tortuosity-mild Calcification-mild Left External Iliac Artery - Minimal Diameter-6.8 x 6.8 mm Tortuosity-mild Calcification-none Left Common Femoral Artery - Minimal Diameter-7.7 x 7.8 mm Tortuosity-mild Calcification-mild Review of the MIP images confirms the above findings. IMPRESSION: 1. Vascular findings and measurements pertinent to potential TAVR procedure, as detailed above. 2. Thickening calcification of the aortic valve, compatible with reported clinical history of severe aortic stenosis. 3. Cardiomegaly with left atrial dilatation,  with filling defect in the left atrial appendage concerning for left atrial appendage thrombus. Correlation with transesophageal echocardiography is recommended to exclude the presence of thrombus if not already performed. 4. Morphologic changes in the liver indicative of early cirrhosis. 5. Colonic diverticulosis without evidence of acute diverticulitis at this time. 6. Additional incidental findings, as above. Electronically Signed   By: Vinnie Langton M.D.   On: 02/15/2021 12:14   CT HEAD CODE STROKE WO CONTRAST  Result Date: 02/17/2021 CLINICAL DATA:  Left facial droop and left arm weakness EXAM: CT HEAD WITHOUT CONTRAST CT ANGIOGRAPHY OF THE HEAD AND NECK TECHNIQUE: Contiguous axial images were obtained from the base of the skull through the vertex without intravenous contrast. Multidetector CT imaging of the head and neck was performed using the standard protocol during bolus administration of intravenous contrast. Multiplanar CT image reconstructions and MIPs were obtained to evaluate the vascular anatomy. Carotid stenosis measurements (when applicable) are obtained utilizing NASCET criteria, using the distal internal carotid diameter as the denominator. CONTRAST:  75mL OMNIPAQUE IOHEXOL 350 MG/ML SOLN COMPARISON:  None. FINDINGS: CT HEAD FINDINGS Brain: There is no mass, hemorrhage or extra-axial collection. The size and configuration of the ventricles and extra-axial CSF spaces are normal. The brain parenchyma is normal, without evidence of acute or chronic infarction. Vascular: No abnormal hyperdensity of the major intracranial arteries or dural venous sinuses. No intracranial atherosclerosis. Skull: The visualized skull base, calvarium and extracranial soft tissues are normal. Sinuses/Orbits: No fluid levels or advanced mucosal thickening of the visualized paranasal sinuses. No mastoid or middle ear effusion. The orbits are normal. ASPECTS ([place] Stroke Program Early CT Score) - Ganglionic level  infarction (caudate, lentiform nuclei, internal capsule, insula, M1-M3 cortex): 7 - Supraganglionic infarction (M4-M6 cortex): 3 Total score (0-10 with 10 being normal): 10 CTA NECK FINDINGS SKELETON: There is no bony spinal canal stenosis. No lytic or blastic lesion. OTHER NECK: Normal pharynx, larynx and major salivary glands. No cervical lymphadenopathy. Unremarkable thyroid gland. UPPER CHEST: No pneumothorax or pleural effusion. No nodules or masses. AORTIC ARCH: There is calcific atherosclerosis of the aortic arch. There is no aneurysm, dissection or hemodynamically significant stenosis of the visualized portion of the aorta. Conventional 3 vessel aortic branching pattern. The visualized proximal subclavian arteries are widely patent. RIGHT CAROTID SYSTEM: Normal without aneurysm, dissection or stenosis. LEFT CAROTID SYSTEM: Normal without  aneurysm, dissection or stenosis. VERTEBRAL ARTERIES: Left dominant configuration. Both origins are clearly patent. There is no dissection, occlusion or flow-limiting stenosis to the skull base (V1-V3 segments). CTA HEAD FINDINGS POSTERIOR CIRCULATION: --Vertebral arteries: Normal V4 segments. --Inferior cerebellar arteries: Normal. --Basilar artery: Normal. --Superior cerebellar arteries: Normal. --Posterior cerebral arteries (PCA): Normal. ANTERIOR CIRCULATION: --Intracranial internal carotid arteries: Normal. --Anterior cerebral arteries (ACA): The right anterior cerebral artery A1 segment is occluded. The proximal right A2 segment is also occluded. There is distal reconstitution. --Middle cerebral arteries (MCA): Normal. VENOUS SINUSES: As permitted by contrast timing, patent. ANATOMIC VARIANTS: None Review of the MIP images confirms the above findings. IMPRESSION: 1. No intracranial hemorrhage.  ASPECTS 10. 2. 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 This is in keeping with left-sided weakness. Critical  Value/emergent results were called by telephone at the time of interpretation on 02/17/2021 at 11:40 pm to Dr. Sedonia Small, Who verbally acknowledged these results. Aortic Atherosclerosis (ICD10-I70.0). Electronically Signed   By: Ulyses Jarred M.D.   On: 02/17/2021 23:42   ECHOCARDIOGRAM LIMITED  Result Date: 02/18/2021    ECHOCARDIOGRAM LIMITED REPORT   Patient Name:   KHALIYAH NORTHROP Date of Exam: 02/18/2021 Medical Rec #:  [id number]               Height:       61.0 in Accession #:    [id number]              Weight:       152.3 lb Date of Birth:  December 19, 1933              BSA:          1.682 m Patient Age:    33 years                BP:           117/61 mmHg Patient Gender: F                       HR:           85 bpm. Exam Location:  [facility] Procedure: Limited Echo Indications:    Stroke I63.9  History:        Patient has prior history of Echocardiogram examinations, most                 recent 12/27/2020. Arrythmias:Atrial Fibrillation; Risk                 Factors:Hypertension, Diabetes and Dyslipidemia. Long term                 (current) use of anticoagulants, Cancer ([place]) (From Hx).  Sonographer:    Alvino Chapel RCS Referring Phys: [id number] OLADAPO ADEFESO IMPRESSIONS  1. See full echo done 12/27/20 regarding valvular heart disease.  2. Left ventricular ejection fraction, by estimation, is 60 to 65%. The left ventricle has normal function. The left ventricle has no regional wall motion abnormalities. There is moderate left ventricular hypertrophy.  3. Right ventricular systolic function is normal. The right ventricular size is normal.  4. Left atrial size was moderately dilated.  5. No color or bubble study done.  6. Right atrial size was moderately dilated.  7. Limited echo known MS/MR no doppler or color flow done . The mitral valve is degenerative. No evidence of mitral valve regurgitation. No evidence of mitral stenosis. Moderate mitral annular calcification.  8.  Limited echo no doppler / color  done known AS/AR . The aortic valve is tricuspid. There is moderate calcification of the aortic valve. There is moderate thickening of the aortic valve. Aortic valve regurgitation is not visualized. No aortic stenosis is present.  9. The inferior vena cava is dilated in size with >50% respiratory variability, suggesting right atrial pressure of 8 mmHg. FINDINGS  Left Ventricle: Left ventricular ejection fraction, by estimation, is 60 to 65%. The left ventricle has normal function. The left ventricle has no regional wall motion abnormalities. The left ventricular internal cavity size was normal in size. There is  moderate left ventricular hypertrophy. Right Ventricle: The right ventricular size is normal. No increase in right ventricular wall thickness. Right ventricular systolic function is normal. Left Atrium: Left atrial size was moderately dilated. Right Atrium: Right atrial size was moderately dilated. Pericardium: There is no evidence of pericardial effusion. Mitral Valve: Limited echo known MS/MR no doppler or color flow done. The mitral valve is degenerative in appearance. There is moderate thickening of the mitral valve leaflet(s). There is moderate calcification of the mitral valve leaflet(s). Moderate mitral annular calcification. No evidence of mitral valve stenosis. Tricuspid Valve: The tricuspid valve is normal in structure. Tricuspid valve regurgitation is not demonstrated. No evidence of tricuspid stenosis. Aortic Valve: Limited echo no doppler / color done known AS/AR. The aortic valve is tricuspid. There is moderate calcification of the aortic valve. There is moderate thickening of the aortic valve. Aortic valve regurgitation is not visualized. No aortic stenosis is present. Pulmonic Valve: The pulmonic valve was normal in structure. Pulmonic valve regurgitation is not visualized. No evidence of pulmonic stenosis. Aorta: The aortic root is normal in size and structure. Venous: The inferior vena cava  is dilated in size with greater than 50% respiratory variability, suggesting right atrial pressure of 8 mmHg. IAS/Shunts: The interatrial septum was not well visualized. LEFT VENTRICLE PLAX 2D LVIDd:         4.00 cm LVIDs:         2.20 cm LV PW:         1.00 cm LV IVS:        1.40 cm LVOT diam:     1.80 cm LVOT Area:     2.54 cm  LEFT ATRIUM         Index LA diam:    4.50 cm 2.67 cm/m   AORTA Ao Root diam: 3.10 cm  SHUNTS Systemic Diam: 1.80 cm Jenkins Rouge MD Electronically signed by Jenkins Rouge MD Signature Date/Time: 02/18/2021/4:45:17 PM    Final    CT ANGIO ABDOMEN PELVIS  W &/OR WO CONTRAST  Result Date: 02/15/2021 CLINICAL DATA:  87 year old female with history of severe aortic stenosis. Preprocedural study prior to potential transcatheter aortic valve replacement (TAVR) procedure. EXAM: CT ANGIOGRAPHY CHEST, ABDOMEN AND PELVIS TECHNIQUE: Multidetector CT imaging through the chest, abdomen and pelvis was performed using the standard protocol during bolus administration of intravenous contrast. Multiplanar reconstructed images and MIPs were obtained and reviewed to evaluate the vascular anatomy. CONTRAST:  19mL OMNIPAQUE IOHEXOL 350 MG/ML SOLN COMPARISON:  CT the abdomen and pelvis 12/27/2017. FINDINGS: CTA CHEST FINDINGS Cardiovascular: Heart size is mildly enlarged with left atrial dilatation. Filling defect in the left atrial appendage concerning for potential thrombus. There is no significant pericardial fluid, thickening or pericardial calcification. Thickening calcification of the aortic valve. Calcifications of the mitral annulus. Mediastinum/Lymph Nodes: No pathologically enlarged mediastinal or hilar lymph nodes. Esophagus is unremarkable in  appearance. No axillary lymphadenopathy. Lungs/Pleura: Scattered areas of scarring or atelectasis in the mid to lower lungs. No acute consolidative airspace disease. No pleural effusions. No suspicious appearing pulmonary nodules or masses are noted.  Musculoskeletal/Soft Tissues: There are no aggressive appearing lytic or blastic lesions noted in the visualized portions of the skeleton. Chronic appearing compression fracture of T4 with 60% loss of anterior vertebral body height. CTA ABDOMEN AND PELVIS FINDINGS Hepatobiliary: Liver has a shrunken appearance and nodular contour, indicative of underlying cirrhosis. No suspicious cystic or solid hepatic lesions. No intra or extrahepatic biliary ductal dilatation. Gallbladder is normal in appearance. Pancreas: No pancreatic mass. No pancreatic ductal dilatation. No pancreatic or peripancreatic fluid collections or inflammatory changes. Spleen: Unremarkable. Adrenals/Urinary Tract: Subcentimeter low-attenuation lesion in the upper pole the right kidney, too small to characterize, but statistically likely to represent a cyst. Left kidney and bilateral adrenal glands are normal in appearance. No hydroureteronephrosis. Urinary bladder is normal in appearance. Stomach/Bowel: The appearance of the stomach is normal. No pathologic dilatation of small bowel or colon. Numerous colonic diverticulae are noted, without surrounding inflammatory changes to suggest an acute diverticulitis at this time. Normal appendix. Vascular/Lymphatic: Vascular findings and measurements pertinent to potential TAVR procedure, as detailed below. Aortic atherosclerosis, without evidence of aneurysm or dissection in the abdominal or pelvic vasculature. No lymphadenopathy noted in the abdomen or pelvis. Reproductive: Uterus and ovaries are unremarkable in appearance. Other: No significant volume of ascites.  No pneumoperitoneum. Musculoskeletal: There are no aggressive appearing lytic or blastic lesions noted in the visualized portions of the skeleton. VASCULAR MEASUREMENTS PERTINENT TO TAVR: AORTA: Minimal Aortic Diameter-14 x 13 mm Severity of Aortic Calcification-mild to moderate RIGHT PELVIS: Right Common Iliac Artery - Minimal Diameter-9.9 x  10.2 mm Tortuosity-mild Calcification-mild Right External Iliac Artery - Minimal Diameter-6.8 x 6.8 mm Tortuosity-mild-to-moderate Calcification-none Right Common Femoral Artery - Minimal Diameter-7.6 x 7.7 mm Tortuosity-mild Calcification-mild LEFT PELVIS: Left Common Iliac Artery - Minimal Diameter-10.0 x 10.3 mm Tortuosity-mild Calcification-mild Left External Iliac Artery - Minimal Diameter-6.8 x 6.8 mm Tortuosity-mild Calcification-none Left Common Femoral Artery - Minimal Diameter-7.7 x 7.8 mm Tortuosity-mild Calcification-mild Review of the MIP images confirms the above findings. IMPRESSION: 1. Vascular findings and measurements pertinent to potential TAVR procedure, as detailed above. 2. Thickening calcification of the aortic valve, compatible with reported clinical history of severe aortic stenosis. 3. Cardiomegaly with left atrial dilatation, with filling defect in the left atrial appendage concerning for left atrial appendage thrombus. Correlation with transesophageal echocardiography is recommended to exclude the presence of thrombus if not already performed. 4. Morphologic changes in the liver indicative of early cirrhosis. 5. Colonic diverticulosis without evidence of acute diverticulitis at this time. 6. Additional incidental findings, as above. Electronically Signed   By: Vinnie Langton M.D.   On: 02/15/2021 12:14     Discharge Exam: Vitals:   02/18/21 0357 02/18/21 1509  BP: 117/61 (!) 102/50  Pulse: 85 91  Resp: 18 18  Temp: 98.1 F (36.7 C) 98 F (36.7 C)  SpO2: 98% 95%   Vitals:   02/18/21 0200 02/18/21 0300 02/18/21 0357 02/18/21 1509  BP: 120/68 111/61 117/61 (!) 102/50  Pulse: (!) 111 96 85 91  Resp: (!) 22 18 18 18   Temp:  97.7 F (36.5 C) 98.1 F (36.7 C) 98 F (36.7 C)  TempSrc:  Oral Oral Oral  SpO2: 98% 93% 98% 95%  Weight:      Height:        General: Pt is  alert, awake, not in acute distress Cardiovascular: RRR, S1/S2 +, no rubs, no gallops Respiratory:  CTA bilaterally, no wheezing, no rhonchi Abdominal: Soft, NT, ND, bowel sounds + Extremities: no edema, no cyanosis    The results of significant diagnostics from this hospitalization (including imaging, microbiology, ancillary and laboratory) are listed below for reference.     Microbiology: Recent Results (from the past 240 hour(s))  Resp Panel by RT-PCR (Flu A&B, Covid) Nasopharyngeal Swab     Status: None   Collection Time: 02/17/21 10:52 PM   Specimen: Nasopharyngeal Swab; Nasopharyngeal(NP) swabs in vial transport medium  Result Value Ref Range Status   SARS Coronavirus 2 by RT PCR NEGATIVE NEGATIVE Final    Comment: (NOTE) SARS-CoV-2 target nucleic acids are NOT DETECTED.  The SARS-CoV-2 RNA is generally detectable in upper respiratory specimens during the acute phase of infection. The lowest concentration of SARS-CoV-2 viral copies this assay can detect is 138 copies/mL. A negative result does not preclude SARS-Cov-2 infection and should not be used as the sole basis for treatment or other patient management decisions. A negative result may occur with  improper specimen collection/handling, submission of specimen other than nasopharyngeal swab, presence of viral mutation(s) within the areas targeted by this assay, and inadequate number of viral copies(<138 copies/mL). A negative result must be combined with clinical observations, patient history, and epidemiological information. The expected result is Negative.  Fact Sheet for Patients:  EntrepreneurPulse.com.au  Fact Sheet for Healthcare Providers:  IncredibleEmployment.be  This test is no t yet approved or cleared by the [place] FDA and  has been authorized for detection and/or diagnosis of SARS-CoV-2 by FDA under an Emergency Use Authorization (EUA). This EUA will remain  in effect (meaning this test can be used) for the duration of the COVID-19 declaration under Section  564(b)(1) of the Act, 21 U.S.C.section 360bbb-3(b)(1), unless the authorization is terminated  or revoked sooner.       Influenza A by PCR NEGATIVE NEGATIVE Final   Influenza B by PCR NEGATIVE NEGATIVE Final    Comment: (NOTE) The Xpert Xpress SARS-CoV-2/FLU/RSV plus assay is intended as an aid in the diagnosis of influenza from Nasopharyngeal swab specimens and should not be used as a sole basis for treatment. Nasal washings and aspirates are unacceptable for Xpert Xpress SARS-CoV-2/FLU/RSV testing.  Fact Sheet for Patients: EntrepreneurPulse.com.au  Fact Sheet for Healthcare Providers: IncredibleEmployment.be  This test is not yet approved or cleared by the [place] FDA and has been authorized for detection and/or diagnosis of SARS-CoV-2 by FDA under an Emergency Use Authorization (EUA). This EUA will remain in effect (meaning this test can be used) for the duration of the COVID-19 declaration under Section 564(b)(1) of the Act, 21 U.S.C. section 360bbb-3(b)(1), unless the authorization is terminated or revoked.  Performed at [facility], [address], [place], [place] [address]      Labs: BNP (last 3 results) No results for input(s): BNP in the last 8760 hours. Basic Metabolic Panel: Recent Labs  Lab 02/17/21 2322 02/17/21 2327 02/18/21 0507  NA 135 139 139  K 3.7 3.7 3.9  CL 101 102 106  CO2 25  --  25  GLUCOSE 114* 108* 110*  BUN 18 17 15   CREATININE 0.67 0.70 0.55  CALCIUM 9.3  --  9.3  MG  --   --  1.9  PHOS  --   --  3.4   Liver Function Tests: Recent Labs  Lab 02/17/21 2322 02/18/21 0507  AST  33 30  ALT 23 20  ALKPHOS 46 43  BILITOT 0.5 0.3  PROT 7.3 6.5  ALBUMIN 3.9 3.5   No results for input(s): LIPASE, AMYLASE in the last 168 hours. No results for input(s): AMMONIA in the last 168 hours. CBC: Recent Labs  Lab 02/17/21 2322 02/17/21 2327 02/18/21 0507  WBC 8.1  --  8.9  NEUTROABS 4.0  --    --   HGB 13.7 13.9 12.6  HCT 42.4 41.0 40.2  MCV 93.8  --  93.1  PLT 207  --  195   Cardiac Enzymes: No results for input(s): CKTOTAL, CKMB, CKMBINDEX, TROPONINI in the last 168 hours. BNP: Invalid input(s): POCBNP CBG: Recent Labs  Lab 02/17/21 2251  GLUCAP 98   D-Dimer No results for input(s): DDIMER in the last 72 hours. Hgb A1c Recent Labs    02/18/21 0507  HGBA1C 5.7*   Lipid Profile Recent Labs    02/18/21 0507  CHOL 152  HDL 52  LDLCALC 86  TRIG 69  CHOLHDL 2.9   Thyroid function studies No results for input(s): TSH, T4TOTAL, T3FREE, THYROIDAB in the last 72 hours.  Invalid input(s): FREET3 Anemia work up No results for input(s): VITAMINB12, FOLATE, FERRITIN, TIBC, IRON, RETICCTPCT in the last 72 hours. Urinalysis    Component Value Date/Time   COLORURINE YELLOW 02/18/2021 0500   APPEARANCEUR CLEAR 02/18/2021 0500   LABSPEC 1.039 (H) 02/18/2021 0500   PHURINE 6.0 02/18/2021 0500   GLUCOSEU NEGATIVE 02/18/2021 0500   HGBUR MODERATE (A) 02/18/2021 0500   BILIRUBINUR NEGATIVE 02/18/2021 0500   KETONESUR NEGATIVE 02/18/2021 0500   PROTEINUR NEGATIVE 02/18/2021 0500   NITRITE POSITIVE (A) 02/18/2021 0500   LEUKOCYTESUR SMALL (A) 02/18/2021 0500   Sepsis Labs Invalid input(s): PROCALCITONIN,  WBC,  LACTICIDVEN Microbiology Recent Results (from the past 240 hour(s))  Resp Panel by RT-PCR (Flu A&B, Covid) Nasopharyngeal Swab     Status: None   Collection Time: 02/17/21 10:52 PM   Specimen: Nasopharyngeal Swab; Nasopharyngeal(NP) swabs in vial transport medium  Result Value Ref Range Status   SARS Coronavirus 2 by RT PCR NEGATIVE NEGATIVE Final    Comment: (NOTE) SARS-CoV-2 target nucleic acids are NOT DETECTED.  The SARS-CoV-2 RNA is generally detectable in upper respiratory specimens during the acute phase of infection. The lowest concentration of SARS-CoV-2 viral copies this assay can detect is 138 copies/mL. A negative result does not preclude  SARS-Cov-2 infection and should not be used as the sole basis for treatment or other patient management decisions. A negative result may occur with  improper specimen collection/handling, submission of specimen other than nasopharyngeal swab, presence of viral mutation(s) within the areas targeted by this assay, and inadequate number of viral copies(<138 copies/mL). A negative result must be combined with clinical observations, patient history, and epidemiological information. The expected result is Negative.  Fact Sheet for Patients:  EntrepreneurPulse.com.au  Fact Sheet for Healthcare Providers:  IncredibleEmployment.be  This test is no t yet approved or cleared by the [place] FDA and  has been authorized for detection and/or diagnosis of SARS-CoV-2 by FDA under an Emergency Use Authorization (EUA). This EUA will remain  in effect (meaning this test can be used) for the duration of the COVID-19 declaration under Section 564(b)(1) of the Act, 21 U.S.C.section 360bbb-3(b)(1), unless the authorization is terminated  or revoked sooner.       Influenza A by PCR NEGATIVE NEGATIVE Final   Influenza B by PCR NEGATIVE NEGATIVE Final  Comment: (NOTE) The Xpert Xpress SARS-CoV-2/FLU/RSV plus assay is intended as an aid in the diagnosis of influenza from Nasopharyngeal swab specimens and should not be used as a sole basis for treatment. Nasal washings and aspirates are unacceptable for Xpert Xpress SARS-CoV-2/FLU/RSV testing.  Fact Sheet for Patients: EntrepreneurPulse.com.au  Fact Sheet for Healthcare Providers: IncredibleEmployment.be  This test is not yet approved or cleared by the [place] FDA and has been authorized for detection and/or diagnosis of SARS-CoV-2 by FDA under an Emergency Use Authorization (EUA). This EUA will remain in effect (meaning this test can be used) for the duration of  the COVID-19 declaration under Section 564(b)(1) of the Act, 21 U.S.C. section 360bbb-3(b)(1), unless the authorization is terminated or revoked.  Performed at [facility], [address], [place], [place] [address]      Time coordinating discharge: 35 minutes  SIGNED:   Rodena Goldmann, DO [facility] 02/18/2021, 4:51 PM  If 7PM-7AM, please contact night-coverage [email or web address]

## 2021-02-18 NOTE — Progress Notes (Signed)
*  PRELIMINARY RESULTS* Echocardiogram Limited 2-D Echocardiogram  has been performed.  Anne Wolf 02/18/2021, 3:02 PM

## 2021-02-18 NOTE — H&P (Signed)
History and Physical  Anne Wolf ZOX:[id number] DOB: 1933/06/08 DOA: 02/17/2021  Referring physician: Maudie Flakes, MD PCP: Asencion Noble, MD  Patient coming from: Home  Chief Complaint:Code Stroke  HPI: Anne Wolf is an 87 y.o. female with medical history significant for aortic stenosis, persistent A-Fib on warfarin, hypertension, T2DM, hyperlipidemia who presents to the emergency department EMS due to strokelike symptoms. Patient was last seen normal at 10:10 PM yesterday sitting on a couch in the living room and doing a crossword puzzle by husband who left the living room, on returning about 10 minutes later, he noted that patient was trying to reach for something on the ground, but also noted left-sided weakness and left facial droop which was new inpatient.  EMS was activated, on arrival of EMS team, patient was noted to be unable to walk and have difficulty with speaking and rightward gaze. She denies a headache, blurry vision, chest pain, shortness of breath, fever, chills nausea or vomiting.  ED Course:  In the emergency department, she was hemodynamically stable.  BP was 134/50, INR 1.2.  Influenza A, B, SARS coronavirus 2 was negative. CT head without contrast and CT angiography of the head and neck showed no intracranial hemorrhage.  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 Patient's symptoms returned to baseline after CT imaging.  Teleneurology who was consulted and recommended TIA work-up per ED physician.  Review of Systems: A full 10 point Review of Systems was done, except as stated above, all other Review of systems were negative.   Past Medical History:  Diagnosis Date   Aortic stenosis    mild; mild MR; slightly increased pulmonary artery pressure with mild RVH; normal LV-2010   Arthritis    Atrial fibrillation (HCC)    chronic anticoagulation; adequate HR control on minimal AV  nodal blocking medication    Cancer ([place])    skin cancers removed from face   CHF (congestive heart failure) ([place])    Diabetes mellitus without complication ([place])    2   Dysrhythmia    A FIB   Fasting hyperglycemia    + microalbuminuria; A1c of 6.5% in 11/2010   Hematochezia 01/2009   01/2009-presumed ischemic colitis; history of diverticulosis   History of kidney stones    Hx of adenomatous colonic polyps 2005   adenomatous polyp; negative colonoscopy in 2009   Hyperlipidemia    Past Surgical History:  Procedure Laterality Date   CATARACT EXTRACTION W/PHACO Right 03/23/2014   Procedure: CATARACT EXTRACTION PHACO AND INTRAOCULAR LENS PLACEMENT ([place]);  Surgeon: Williams Che, MD;  Location: [facility];  Service: Ophthalmology;  Laterality: Right;  CDE:2.83   CATARACT EXTRACTION W/PHACO Left 07/27/2014   Procedure: CATARACT EXTRACTION PHACO AND INTRAOCULAR LENS PLACEMENT LEFT EYE CDE=6.97;  Surgeon: Williams Che, MD;  Location: [facility];  Service: Ophthalmology;  Laterality: Left;   COLONOSCOPY  2009   negative; prior study with adenomatous polyp   CYSTOSCOPY/URETEROSCOPY/HOLMIUM LASER/STENT PLACEMENT Right 08/21/2016   Procedure: CYSTOSCOPY/URETEROSCOPY/RETROGRADE PYELOGRAM//STENT PLACEMENT;  Surgeon: Nickie Retort, MD;  Location: [facility];  Service: Urology;  Laterality: Right;   EYE SURGERY     RIGHT/LEFT HEART CATH AND CORONARY ANGIOGRAPHY N/A 02/04/2021   Procedure: RIGHT/LEFT HEART CATH AND CORONARY ANGIOGRAPHY;  Surgeon: Sherren Mocha, MD;  Location: [facility];  Service: Cardiovascular;  Laterality: N/A;    Social History:  reports that she has never smoked. She has never used smokeless tobacco.  She reports that she does not drink alcohol and does not use drugs.   Allergies  Allergen Reactions   Cat Hair Extract Other (See Comments)    Red eyes and Sneezing Also allergic to Dogs    Family History  Problem Relation Age of Onset   Cancer Father    Arrhythmia  Sister        Atrial fibrillation     Prior to Admission medications   Medication Sig Start Date End Date Taking? Authorizing Provider  alendronate (FOSAMAX) 70 MG tablet Take 70 mg by mouth once a week.    [provider]  Biotin 5000 MCG CAPS Take 5,000 mcg by mouth daily.    [provider]  calcium carbonate (OSCAL) 1500 (600 Ca) MG TABS tablet Take 600 mg of elemental calcium by mouth daily.    [provider]  cholecalciferol (VITAMIN D) 1000 units tablet Take 1,000 Units by mouth at bedtime.    [provider]  Co-Enzyme Q-10 100 MG CAPS Take 100 mg by mouth at bedtime.    [provider]  lisinopril (PRINIVIL,ZESTRIL) 10 MG tablet Take 10 mg by mouth at bedtime.    [provider]  loratadine (CLARITIN) 10 MG tablet Take 10 mg by mouth at bedtime.    [provider]  metFORMIN (GLUCOPHAGE) 500 MG tablet Take 500 mg by mouth 2 (two) times daily with a meal.     [provider]  metoprolol succinate (TOPROL-XL) 25 MG 24 hr tablet Take 25 mg by mouth at bedtime.    [provider]  Multiple Vitamins-Minerals (CENTRUM SILVER) tablet Take 1 tablet by mouth at bedtime.     [provider]  simvastatin (ZOCOR) 40 MG tablet Take 40 mg by mouth at bedtime.      [provider]  warfarin (COUMADIN) 2 MG tablet Take 1 tablet daily except 1/2 tablet on Wednesdays and Saturdays or as directed Patient taking differently: Take 1-2 mg by mouth See admin instructions. Take 1 mg on Wednesday and Saturday. All the other days take 2 mg in the evening 12/08/20   Satira Sark, MD    Physical Exam: BP 129/61    Pulse 80    Temp 97.6 F (36.4 C) (Oral)    Resp 20    Ht 5\' 1"  (1.549 m)    Wt 69.1 kg    SpO2 94%    BMI 28.78 kg/m   General: 87 y.o. year-old female well developed well nourished in no acute distress.  Alert and oriented x3. HEENT: NCAT, EOMI Neck: Supple, trachea medial Cardiovascular:  Irregular rate and rhythm with no rubs or gallops.  No thyromegaly or JVD noted.  No lower extremity edema. 2/4 pulses in all 4 extremities. Respiratory: Clear to auscultation with no wheezes or rales. Good inspiratory effort. Abdomen: Soft, nontender nondistended with normal bowel sounds x4 quadrants. Muskuloskeletal: No cyanosis, clubbing or edema noted bilaterally Neuro: CN II-XII intact, strength 5/5 x 4, sensation, reflexes intact. NIHSS = 0 Skin: No ulcerative lesions noted or rashes Psychiatry: Judgement and insight appear normal. Mood is appropriate for condition and setting          Labs on Admission:  Basic Metabolic Panel: Recent Labs  Lab 02/17/21 2322 02/17/21 2327  NA 135 139  K 3.7 3.7  CL 101 102  CO2 25  --   GLUCOSE 114* 108*  BUN 18 17  CREATININE 0.67 0.70  CALCIUM 9.3  --  Liver Function Tests: Recent Labs  Lab 02/17/21 2322  AST 33  ALT 23  ALKPHOS 46  BILITOT 0.5  PROT 7.3  ALBUMIN 3.9   No results for input(s): LIPASE, AMYLASE in the last 168 hours. No results for input(s): AMMONIA in the last 168 hours. CBC: Recent Labs  Lab 02/17/21 2322 02/17/21 2327  WBC 8.1  --   NEUTROABS 4.0  --   HGB 13.7 13.9  HCT 42.4 41.0  MCV 93.8  --   PLT 207  --    Cardiac Enzymes: No results for input(s): CKTOTAL, CKMB, CKMBINDEX, TROPONINI in the last 168 hours.  BNP (last 3 results) No results for input(s): BNP in the last 8760 hours.  ProBNP (last 3 results) No results for input(s): PROBNP in the last 8760 hours.  CBG: Recent Labs  Lab 02/17/21 2251  GLUCAP 98    Radiological Exams on Admission: CT Angio Head W or Wo Contrast  Result Date: 02/17/2021 CLINICAL DATA:  Left facial droop and left arm weakness EXAM: CT HEAD WITHOUT CONTRAST CT ANGIOGRAPHY OF THE HEAD AND NECK TECHNIQUE: Contiguous axial images were obtained from the base of the skull through the vertex without intravenous contrast. Multidetector CT imaging of the head and  neck was performed using the standard protocol during bolus administration of intravenous contrast. Multiplanar CT image reconstructions and MIPs were obtained to evaluate the vascular anatomy. Carotid stenosis measurements (when applicable) are obtained utilizing NASCET criteria, using the distal internal carotid diameter as the denominator. CONTRAST:  81mL OMNIPAQUE IOHEXOL 350 MG/ML SOLN COMPARISON:  None. FINDINGS: CT HEAD FINDINGS Brain: There is no mass, hemorrhage or extra-axial collection. The size and configuration of the ventricles and extra-axial CSF spaces are normal. The brain parenchyma is normal, without evidence of acute or chronic infarction. Vascular: No abnormal hyperdensity of the major intracranial arteries or dural venous sinuses. No intracranial atherosclerosis. Skull: The visualized skull base, calvarium and extracranial soft tissues are normal. Sinuses/Orbits: No fluid levels or advanced mucosal thickening of the visualized paranasal sinuses. No mastoid or middle ear effusion. The orbits are normal. ASPECTS ([place] Stroke Program Early CT Score) - Ganglionic level infarction (caudate, lentiform nuclei, internal capsule, insula, M1-M3 cortex): 7 - Supraganglionic infarction (M4-M6 cortex): 3 Total score (0-10 with 10 being normal): 10 CTA NECK FINDINGS SKELETON: There is no bony spinal canal stenosis. No lytic or blastic lesion. OTHER NECK: Normal pharynx, larynx and major salivary glands. No cervical lymphadenopathy. Unremarkable thyroid gland. UPPER CHEST: No pneumothorax or pleural effusion. No nodules or masses. AORTIC ARCH: There is calcific atherosclerosis of the aortic arch. There is no aneurysm, dissection or hemodynamically significant stenosis of the visualized portion of the aorta. Conventional 3 vessel aortic branching pattern. The visualized proximal subclavian arteries are widely patent. RIGHT CAROTID SYSTEM: Normal without aneurysm, dissection or stenosis. LEFT CAROTID SYSTEM:  Normal without aneurysm, dissection or stenosis. VERTEBRAL ARTERIES: Left dominant configuration. Both origins are clearly patent. There is no dissection, occlusion or flow-limiting stenosis to the skull base (V1-V3 segments). CTA HEAD FINDINGS POSTERIOR CIRCULATION: --Vertebral arteries: Normal V4 segments. --Inferior cerebellar arteries: Normal. --Basilar artery: Normal. --Superior cerebellar arteries: Normal. --Posterior cerebral arteries (PCA): Normal. ANTERIOR CIRCULATION: --Intracranial internal carotid arteries: Normal. --Anterior cerebral arteries (ACA): The right anterior cerebral artery A1 segment is occluded. The proximal right A2 segment is also occluded. There is distal reconstitution. --Middle cerebral arteries (MCA): Normal. VENOUS SINUSES: As permitted by contrast timing, patent. ANATOMIC VARIANTS: None Review of the MIP images confirms the  above findings. IMPRESSION: 1. No intracranial hemorrhage.  ASPECTS 10. 2. 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 This is in keeping with left-sided weakness. Critical Value/emergent results were called by telephone at the time of interpretation on 02/17/2021 at 11:40 pm to Dr. Sedonia Small, Who verbally acknowledged these results. Aortic Atherosclerosis (ICD10-I70.0). Electronically Signed   By: Ulyses Jarred M.D.   On: 02/17/2021 23:42   CT Angio Neck W and/or Wo Contrast  Result Date: 02/17/2021 CLINICAL DATA:  Left facial droop and left arm weakness EXAM: CT HEAD WITHOUT CONTRAST CT ANGIOGRAPHY OF THE HEAD AND NECK TECHNIQUE: Contiguous axial images were obtained from the base of the skull through the vertex without intravenous contrast. Multidetector CT imaging of the head and neck was performed using the standard protocol during bolus administration of intravenous contrast. Multiplanar CT image reconstructions and MIPs were obtained to evaluate the vascular anatomy. Carotid stenosis measurements  (when applicable) are obtained utilizing NASCET criteria, using the distal internal carotid diameter as the denominator. CONTRAST:  58mL OMNIPAQUE IOHEXOL 350 MG/ML SOLN COMPARISON:  None. FINDINGS: CT HEAD FINDINGS Brain: There is no mass, hemorrhage or extra-axial collection. The size and configuration of the ventricles and extra-axial CSF spaces are normal. The brain parenchyma is normal, without evidence of acute or chronic infarction. Vascular: No abnormal hyperdensity of the major intracranial arteries or dural venous sinuses. No intracranial atherosclerosis. Skull: The visualized skull base, calvarium and extracranial soft tissues are normal. Sinuses/Orbits: No fluid levels or advanced mucosal thickening of the visualized paranasal sinuses. No mastoid or middle ear effusion. The orbits are normal. ASPECTS ([place] Stroke Program Early CT Score) - Ganglionic level infarction (caudate, lentiform nuclei, internal capsule, insula, M1-M3 cortex): 7 - Supraganglionic infarction (M4-M6 cortex): 3 Total score (0-10 with 10 being normal): 10 CTA NECK FINDINGS SKELETON: There is no bony spinal canal stenosis. No lytic or blastic lesion. OTHER NECK: Normal pharynx, larynx and major salivary glands. No cervical lymphadenopathy. Unremarkable thyroid gland. UPPER CHEST: No pneumothorax or pleural effusion. No nodules or masses. AORTIC ARCH: There is calcific atherosclerosis of the aortic arch. There is no aneurysm, dissection or hemodynamically significant stenosis of the visualized portion of the aorta. Conventional 3 vessel aortic branching pattern. The visualized proximal subclavian arteries are widely patent. RIGHT CAROTID SYSTEM: Normal without aneurysm, dissection or stenosis. LEFT CAROTID SYSTEM: Normal without aneurysm, dissection or stenosis. VERTEBRAL ARTERIES: Left dominant configuration. Both origins are clearly patent. There is no dissection, occlusion or flow-limiting stenosis to the skull base (V1-V3  segments). CTA HEAD FINDINGS POSTERIOR CIRCULATION: --Vertebral arteries: Normal V4 segments. --Inferior cerebellar arteries: Normal. --Basilar artery: Normal. --Superior cerebellar arteries: Normal. --Posterior cerebral arteries (PCA): Normal. ANTERIOR CIRCULATION: --Intracranial internal carotid arteries: Normal. --Anterior cerebral arteries (ACA): The right anterior cerebral artery A1 segment is occluded. The proximal right A2 segment is also occluded. There is distal reconstitution. --Middle cerebral arteries (MCA): Normal. VENOUS SINUSES: As permitted by contrast timing, patent. ANATOMIC VARIANTS: None Review of the MIP images confirms the above findings. IMPRESSION: 1. No intracranial hemorrhage.  ASPECTS 10. 2. 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 This is in keeping with left-sided weakness. Critical Value/emergent results were called by telephone at the time of interpretation on 02/17/2021 at 11:40 pm to Dr. Sedonia Small, Who verbally acknowledged these results. Aortic Atherosclerosis (ICD10-I70.0). Electronically Signed   By: Ulyses Jarred M.D.   On: 02/17/2021 23:42   CT HEAD  CODE STROKE WO CONTRAST  Result Date: 02/17/2021 CLINICAL DATA:  Left facial droop and left arm weakness EXAM: CT HEAD WITHOUT CONTRAST CT ANGIOGRAPHY OF THE HEAD AND NECK TECHNIQUE: Contiguous axial images were obtained from the base of the skull through the vertex without intravenous contrast. Multidetector CT imaging of the head and neck was performed using the standard protocol during bolus administration of intravenous contrast. Multiplanar CT image reconstructions and MIPs were obtained to evaluate the vascular anatomy. Carotid stenosis measurements (when applicable) are obtained utilizing NASCET criteria, using the distal internal carotid diameter as the denominator. CONTRAST:  49mL OMNIPAQUE IOHEXOL 350 MG/ML SOLN COMPARISON:  None. FINDINGS: CT HEAD FINDINGS  Brain: There is no mass, hemorrhage or extra-axial collection. The size and configuration of the ventricles and extra-axial CSF spaces are normal. The brain parenchyma is normal, without evidence of acute or chronic infarction. Vascular: No abnormal hyperdensity of the major intracranial arteries or dural venous sinuses. No intracranial atherosclerosis. Skull: The visualized skull base, calvarium and extracranial soft tissues are normal. Sinuses/Orbits: No fluid levels or advanced mucosal thickening of the visualized paranasal sinuses. No mastoid or middle ear effusion. The orbits are normal. ASPECTS ([place] Stroke Program Early CT Score) - Ganglionic level infarction (caudate, lentiform nuclei, internal capsule, insula, M1-M3 cortex): 7 - Supraganglionic infarction (M4-M6 cortex): 3 Total score (0-10 with 10 being normal): 10 CTA NECK FINDINGS SKELETON: There is no bony spinal canal stenosis. No lytic or blastic lesion. OTHER NECK: Normal pharynx, larynx and major salivary glands. No cervical lymphadenopathy. Unremarkable thyroid gland. UPPER CHEST: No pneumothorax or pleural effusion. No nodules or masses. AORTIC ARCH: There is calcific atherosclerosis of the aortic arch. There is no aneurysm, dissection or hemodynamically significant stenosis of the visualized portion of the aorta. Conventional 3 vessel aortic branching pattern. The visualized proximal subclavian arteries are widely patent. RIGHT CAROTID SYSTEM: Normal without aneurysm, dissection or stenosis. LEFT CAROTID SYSTEM: Normal without aneurysm, dissection or stenosis. VERTEBRAL ARTERIES: Left dominant configuration. Both origins are clearly patent. There is no dissection, occlusion or flow-limiting stenosis to the skull base (V1-V3 segments). CTA HEAD FINDINGS POSTERIOR CIRCULATION: --Vertebral arteries: Normal V4 segments. --Inferior cerebellar arteries: Normal. --Basilar artery: Normal. --Superior cerebellar arteries: Normal. --Posterior cerebral  arteries (PCA): Normal. ANTERIOR CIRCULATION: --Intracranial internal carotid arteries: Normal. --Anterior cerebral arteries (ACA): The right anterior cerebral artery A1 segment is occluded. The proximal right A2 segment is also occluded. There is distal reconstitution. --Middle cerebral arteries (MCA): Normal. VENOUS SINUSES: As permitted by contrast timing, patent. ANATOMIC VARIANTS: None Review of the MIP images confirms the above findings. IMPRESSION: 1. No intracranial hemorrhage.  ASPECTS 10. 2. 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 This is in keeping with left-sided weakness. Critical Value/emergent results were called by telephone at the time of interpretation on 02/17/2021 at 11:40 pm to Dr. Sedonia Small, Who verbally acknowledged these results. Aortic Atherosclerosis (ICD10-I70.0). Electronically Signed   By: Ulyses Jarred M.D.   On: 02/17/2021 23:42    EKG: I independently viewed the EKG done and my findings are as followed: A. 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 Echocardiogram done in November 2022 showed LVEF of 60 to 65%.  LV with no RWMA.echocardiogram will be done in the morning MRI of brain without contrast in the morning Continue aspirin and statin Continue fall precautions and neuro checks Lipid panel and hemoglobin A1c will be checked Continue PT/OT  eval and treat Bedside swallow eval by nursing prior to diet Consider SLP if patient fails bedside swallow eval Tele neurology was consulted and we shall await further recommendation  Moderate aortic stenosis Stable, continue warfarin per home regimen Patient recently underwent catheterization and coronary angiography which showed 1.  Patent coronary arteries with no significant stenoses.  Mild calcification noted. 2.  Moderate to severe aortic stenosis with mean transvalvular gradient 28 mmHg and calculated aortic valve area 1.1 cm 3.  Normal right heart hemodynamics   Chronic atrial fibrillation EKG shows A. fib with rate control Patient on warfarin, INR is 1.2 Continue warfarin and continue to monitor INR Toprol will be temporarily held so as to maintain permissive hypertension  Essential hypertension BP meds will be held at this time due to soft BP and need to maintain permissive hypertension  Mixed hyperlipidemia Continue statin  Type 2 diabetes mellitus Continue diet modification; last A1c in August 2022 was 5.7 Metformin will be held at this time  DVT prophylaxis: Warfarin  Code Status: Full code  Family Communication: None at bedside  Disposition Plan:  Patient is from:                        home Anticipated DC to:                   SNF or family members home Anticipated DC date:               2-3 days Anticipated DC barriers:          Patient requires inpatient management due to TIA/stroke requiring further stroke work-up  Consults called: Neurology  Admission status: Observation    Bernadette Hoit MD [facility]  02/18/2021, 12:30 AM

## 2021-02-18 NOTE — Care Management Obs Status (Signed)
[place] NOTIFICATION   Patient Details  Name: Anne Wolf MRN: [id number] Date of Birth: April 03, 1933   Medicare Observation Status Notification Given:  Yes    Iona Beard, [place] 02/18/2021, 9:15 AM

## 2021-02-18 NOTE — Consult Note (Signed)
[facility] TeleNeurology Consult Services   Patient Name:   Anne Wolf, Anne Wolf Date of Birth:   [birth year] Identification Number:   MRN - [id number] Date of Service:   02/17/2021 23:09:04  Diagnosis:       R53.1 - Weakness  Impression:      64 F, h/o HTN, HL, DM, afib on coumadin, who was LKW at 10:10 PM when she developed transient L sided wkness, slurred speech and R gaze deviation. By the time she returned to her room from CT scan, pt was back to her nl self with NIHSS 0, so she is not a thrombolytic candidate. Head CT neg for acute abnl.    CTA head/neck  1. No intracranial hemorrhage. ASPECTS 10.  2. 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 This is in keeping with  left-sided weakness.    I suspect that this clot captured on CTA may have already disintegrated, or at least adequate flow was restored through it since pt is now asymptomatic and remains that way. ER physician notified me when this reading was final and confirms that pt remains at her baseline self with no recurrence of symptoms. With NIHSS of 0, she is not a thrombectomy candidate.    PLAN  - frequent neuro checks while in ER; if sx recur, pls call us back to notify IR  - otherwise, admit for stroke workup  - MRI brain w/o contrast  - TTE w/bubble  - check a1c and LDL  - continue coumadin for now  - neuro to follow  --  Metrics: Last Known Well: 02/17/2021 22:10:00 [facility] Notification Time: 02/17/2021 23:09:04 Arrival Time: 02/17/2021 22:44:00 Stamp Time: 02/17/2021 23:09:04 Initial Response Time: 02/17/2021 23:13:49 Symptoms: L sided wkness, R gaze deviation. NIHSS Start Assessment Time: 02/17/2021 23:26:46 Patient is not a candidate for Thrombolytic. Thrombolytic Medical Decision: 02/17/2021 23:26:58 Patient was not deemed candidate for Thrombolytic because of following reasons: Use of NOAs within 48  hours. Resolved symptoms (no residual disabling symptoms).  CT head showed no acute hemorrhage or acute core infarct.  ED Physician notified of diagnostic impression and management plan on 02/17/2021 23:39:12  Advanced Imaging: CTA Head and Neck Completed.  LVO:Yes  Discussed with NIR:No  Pt asymptomatic at this time, so IR was not called. Clot has either disintegrated or adequate flow has been restored through the lesion   Our recommendations are outlined below.  Recommendations:        Stroke/Telemetry Floor       Neuro Checks       Bedside Swallow Eval       DVT Prophylaxis       IV Fluids, Normal Saline       Head of Bed 30 Degrees       Euglycemia and Avoid Hyperthermia (PRN Acetaminophen)   Sign Out:       Discussed with Emergency Department Provider    ------------------------------------------------------------------------------  History of Present Illness: Patient is a 87 year old Female.  Patient was brought by private transportation with symptoms of L sided wkness, R gaze deviation. 61 F, h/o HTN, HL, DM, afib on coumadin, who was LKW at 10:10 PM when she was doing a crossword puzzle. Husband left the room for a minute, returned, and found pt with L sided wkness, slurred speech, and R gaze deviation. On arrival to ER, she was still noted to be in the same state, but by the time she was done with CT  scan and got back to her room, she was fully back to normal with NIHSS of 0. No history of stroke in the past.   Past Medical History:      Hypertension      Diabetes Mellitus      Hyperlipidemia      Atrial Fibrillation  Anticoagulant use:  Yes coumadin No Antiplatelet use Reviewed EMR for current medications  Allergies:  Reviewed  Social History: Smoking: No Alcohol Use: No  There is no family history of premature cerebrovascular disease pertinent to this consultation  ROS : 14 Points Review of Systems was performed and was negative except  mentioned in HPI.  Past Surgical History: There Is No Surgical History Contributory To Todays Visit    Examination: 1A: Level of Consciousness - Alert; keenly responsive + 0 1B: Ask Month and Age - Both Questions Right + 0 1C: Blink Eyes & Squeeze Hands - Performs Both Tasks + 0 2: Test Horizontal Extraocular Movements - Normal + 0 3: Test Visual Fields - No Visual Loss + 0 4: Test Facial Palsy (Use Grimace if Obtunded) - Normal symmetry + 0 5A: Test Left Arm Motor Drift - No Drift for 10 Seconds + 0 5B: Test Right Arm Motor Drift - No Drift for 10 Seconds + 0 6A: Test Left Leg Motor Drift - No Drift for 5 Seconds + 0 6B: Test Right Leg Motor Drift - No Drift for 5 Seconds + 0 7: Test Limb Ataxia (FNF/Heel-Shin) - No Ataxia + 0 8: Test Sensation - Normal; No sensory loss + 0 9: Test Language/Aphasia - Normal; No aphasia + 0 10: Test Dysarthria - Normal + 0 11: Test Extinction/Inattention - No abnormality + 0  NIHSS Score: 0   Pre-Morbid Modified Rankin Scale: 0 Points = No symptoms at all   Patient/Family was informed the Neurology Consult would occur via TeleHealth consult by way of interactive audio and video telecommunications and consented to receiving care in this manner.   Patient is being evaluated for possible acute neurologic impairment and high probability of imminent or life-threatening deterioration. I spent total of 18 minutes providing care to this patient, including time for face to face visit via telemedicine, review of medical records, imaging studies and discussion of findings with providers, the patient and/or family.   Dr Burtis Junes   [facility] [phone]   Case [id number]

## 2021-02-18 NOTE — Progress Notes (Signed)
ANTICOAGULATION CONSULT NOTE - Initial Consult  Pharmacy Consult for warfarin Indication: atrial fibrillation  Allergies  Allergen Reactions   Cat Hair Extract Other (See Comments)    Red eyes and Sneezing Also allergic to Dogs    Patient Measurements: Height: 5\' 1"  (154.9 cm) Weight: 69.1 kg (152 lb 5.4 oz) IBW/kg (Calculated) : 47.8 Heparin Dosing Weight:   Vital Signs: Temp: 98.1 F (36.7 C) (12/30 0357) Temp Source: Oral (12/30 0357) BP: 117/61 (12/30 0357) Pulse Rate: 85 (12/30 0357)  Labs: Recent Labs    02/17/21 0841 02/17/21 2322 02/17/21 2322 02/17/21 2327 02/18/21 0507  HGB  --  13.7   < > 13.9 12.6  HCT  --  42.4  --  41.0 40.2  PLT  --  207  --   --  195  APTT  --  29  --   --   --   LABPROT  --  15.1  --   --  15.0  INR 1.3* 1.2  --   --  1.2  CREATININE  --  0.67  --  0.70 0.55   < > = values in this interval not displayed.    Estimated Creatinine Clearance: 45.7 mL/min (by C-G formula based on SCr of 0.55 mg/dL).   Medical History: Past Medical History:  Diagnosis Date   Aortic stenosis    mild; mild MR; slightly increased pulmonary artery pressure with mild RVH; normal LV-2010   Arthritis    Atrial fibrillation (HCC)    chronic anticoagulation; adequate HR control on minimal AV nodal blocking medication    Cancer ([place])    skin cancers removed from face   CHF (congestive heart failure) ([place])    Diabetes mellitus without complication ([place])    2   Dysrhythmia    A FIB   Fasting hyperglycemia    + microalbuminuria; A1c of 6.5% in 11/2010   Hematochezia 01/2009   01/2009-presumed ischemic colitis; history of diverticulosis   History of kidney stones    Hx of adenomatous colonic polyps 2005   adenomatous polyp; negative colonoscopy in 2009   Hyperlipidemia     Medications:  Medications Prior to Admission  Medication Sig Dispense Refill Last Dose   alendronate (FOSAMAX) 70 MG tablet Take 70 mg by mouth once a week.      Biotin 5000  MCG CAPS Take 5,000 mcg by mouth daily.      calcium carbonate (OSCAL) 1500 (600 Ca) MG TABS tablet Take 600 mg of elemental calcium by mouth daily.      cholecalciferol (VITAMIN D) 1000 units tablet Take 1,000 Units by mouth at bedtime.      Co-Enzyme Q-10 100 MG CAPS Take 100 mg by mouth at bedtime.      lisinopril (PRINIVIL,ZESTRIL) 10 MG tablet Take 10 mg by mouth at bedtime.      loratadine (CLARITIN) 10 MG tablet Take 10 mg by mouth at bedtime.      metFORMIN (GLUCOPHAGE) 500 MG tablet Take 500 mg by mouth 2 (two) times daily with a meal.       metoprolol succinate (TOPROL-XL) 25 MG 24 hr tablet Take 25 mg by mouth at bedtime.      Multiple Vitamins-Minerals (CENTRUM SILVER) tablet Take 1 tablet by mouth at bedtime.       simvastatin (ZOCOR) 40 MG tablet Take 40 mg by mouth at bedtime.        warfarin (COUMADIN) 2 MG tablet Take 1 tablet daily except 1/2 tablet  on Wednesdays and Saturdays or as directed (Patient taking differently: Take 1-2 mg by mouth See admin instructions. Take 1 mg on Wednesday and Saturday. All the other days take 2 mg in the evening) 90 tablet 3     Assessment: Pharmacy consulted to dose warfarin in patient with atrial fibrillation.  Patient's INR on admission is subtherapeutic at 1.2. Home dose listed as 1 mg on Wed and Sat and 2 mg ROW.  Goal of Therapy:  INR 2-3 Monitor platelets by anticoagulation protocol: Yes   Plan:  Warfarin 2.5 mg x 1 dose Monitor daily INR and s/s of bleeding  Margot Ables, PharmD Clinical Pharmacist 02/18/2021 8:31 AM

## 2021-02-25 DIAGNOSIS — Z8673 Personal history of transient ischemic attack (TIA), and cerebral infarction without residual deficits: Secondary | ICD-10-CM | POA: Diagnosis not present

## 2021-02-25 DIAGNOSIS — I350 Nonrheumatic aortic (valve) stenosis: Secondary | ICD-10-CM | POA: Diagnosis not present

## 2021-03-03 ENCOUNTER — Ambulatory Visit: Payer: Medicare HMO | Admitting: Cardiology

## 2021-03-03 ENCOUNTER — Ambulatory Visit (INDEPENDENT_AMBULATORY_CARE_PROVIDER_SITE_OTHER): Payer: Medicare HMO | Admitting: *Deleted

## 2021-03-03 ENCOUNTER — Encounter: Payer: Self-pay | Admitting: Cardiology

## 2021-03-03 VITALS — BP 110/58 | HR 76 | Ht 61.0 in | Wt 152.6 lb

## 2021-03-03 DIAGNOSIS — I4891 Unspecified atrial fibrillation: Secondary | ICD-10-CM | POA: Diagnosis not present

## 2021-03-03 DIAGNOSIS — I4819 Other persistent atrial fibrillation: Secondary | ICD-10-CM | POA: Diagnosis not present

## 2021-03-03 DIAGNOSIS — I350 Nonrheumatic aortic (valve) stenosis: Secondary | ICD-10-CM

## 2021-03-03 LAB — POCT INR: INR: 1.9 — AB (ref 2.0–3.0)

## 2021-03-03 NOTE — Progress Notes (Signed)
Clinical Summary Anne Wolf is a 88 y.o.femaleseen today for follow up of the following medical problems.    1. Afib - no recent palpitations - compliant with meds.    - no recent palpitations. Compliant with meds.  - isolated nose bleed, no recurrent issues. Remains on coumadin. Was seen by ENT and had cautery - has some interested in NOACs    - nose bleeds on eliquis - INR today is 1.9 - had coumadin held for procedures. Would need bridging for any future procedures - no recent palpitations.    2. Hyperlipidemia - compliant with meds - 01/2021 TC 152 TG 69 HDL 52 LDL 86   3. Aortic stenosis -  Mild to moderate by echo 10/2013. Mean grad 11, ava 1.24 08/2016 echo LVEF 55-60%, mild to mod AS. Mean grad 17, AVA VTI 1.09   - no recent symptoms.     12/2020 echo LVEF 60-65%. Mod to severe AS mean grad 29, AVA VTI 0.95 DI 0.30 - PA Leonides Sake had referred to valve clinic at that time - 01/2021 echo LVEF 60-65%,  - has some ongoing SOB, DOE with activities.    4. 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 in setting of subtherapeutic INR at 1.3 - has had to be off coumadin recently for invasvive procedures/testing  Past Medical History:  Diagnosis Date   Aortic stenosis    mild; mild MR; slightly increased pulmonary artery pressure with mild RVH; normal LV-2010   Arthritis    Atrial fibrillation (HCC)    chronic anticoagulation; adequate HR control on minimal AV nodal blocking medication    Cancer ([place])    skin cancers removed from face   CHF (congestive heart failure) ([place])    Diabetes mellitus without complication ([place])    2   Dysrhythmia    A FIB   Fasting hyperglycemia    + microalbuminuria; A1c of 6.5% in 11/2010   Hematochezia 01/2009   01/2009-presumed ischemic colitis; history of diverticulosis   History of kidney stones    Hx of adenomatous colonic polyps 2005   adenomatous polyp; negative  colonoscopy in 2009   Hyperlipidemia      Allergies  Allergen Reactions   Cat Hair Extract Other (See Comments)    Red eyes and Sneezing Also allergic to Dogs     Current Outpatient Medications  Medication Sig Dispense Refill   alendronate (FOSAMAX) 70 MG tablet Take 70 mg by mouth once a week.     Biotin 5000 MCG CAPS Take 5,000 mcg by mouth daily.     calcium carbonate (OSCAL) 1500 (600 Ca) MG TABS tablet Take 600 mg of elemental calcium by mouth daily. (Patient not taking: Reported on 02/18/2021)     cholecalciferol (VITAMIN D) 1000 units tablet Take 1,000 Units by mouth at bedtime.     Co-Enzyme Q-10 100 MG CAPS Take 100 mg by mouth at bedtime.     lisinopril (PRINIVIL,ZESTRIL) 10 MG tablet Take 10 mg by mouth at bedtime.     loratadine (CLARITIN) 10 MG tablet Take 10 mg by mouth at bedtime.     metFORMIN (GLUCOPHAGE) 500 MG tablet Take 500 mg by mouth 2 (two) times daily with a meal.      metoprolol succinate (TOPROL-XL) 25 MG 24 hr tablet Take 25 mg by mouth at bedtime.     Multiple Vitamins-Minerals (CENTRUM SILVER) tablet Take 1 tablet by mouth at  bedtime.      simvastatin (ZOCOR) 40 MG tablet Take 40 mg by mouth at bedtime.       warfarin (COUMADIN) 2 MG tablet Take 1 tablet daily except 1/2 tablet on Wednesdays and Saturdays or as directed (Patient taking differently: Take 1-2 mg by mouth See admin instructions. Take 1 mg on Monday andWednesday. All the other days take 2 mg in the evening) 90 tablet 3   No current facility-administered medications for this visit.     Past Surgical History:  Procedure Laterality Date   CATARACT EXTRACTION W/PHACO Right 03/23/2014   Procedure: CATARACT EXTRACTION PHACO AND INTRAOCULAR LENS PLACEMENT (IOC);  Surgeon: Williams Che, MD;  Location: [facility];  Service: Ophthalmology;  Laterality: Right;  CDE:2.83   CATARACT EXTRACTION W/PHACO Left 07/27/2014   Procedure: CATARACT EXTRACTION PHACO AND INTRAOCULAR LENS PLACEMENT LEFT EYE CDE=6.97;   Surgeon: Williams Che, MD;  Location: [facility];  Service: Ophthalmology;  Laterality: Left;   COLONOSCOPY  2009   negative; prior study with adenomatous polyp   CYSTOSCOPY/URETEROSCOPY/HOLMIUM LASER/STENT PLACEMENT Right 08/21/2016   Procedure: CYSTOSCOPY/URETEROSCOPY/RETROGRADE PYELOGRAM//STENT PLACEMENT;  Surgeon: Nickie Retort, MD;  Location: [facility];  Service: Urology;  Laterality: Right;   EYE SURGERY     RIGHT/LEFT HEART CATH AND CORONARY ANGIOGRAPHY N/A 02/04/2021   Procedure: RIGHT/LEFT HEART CATH AND CORONARY ANGIOGRAPHY;  Surgeon: Sherren Mocha, MD;  Location: [facility];  Service: Cardiovascular;  Laterality: N/A;     Allergies  Allergen Reactions   Cat Hair Extract Other (See Comments)    Red eyes and Sneezing Also allergic to Dogs      Family History  Problem Relation Age of Onset   Cancer Father    Arrhythmia Sister        Atrial fibrillation     Social History Ms. Delia reports that she has never smoked. She has never used smokeless tobacco. Ms. Peachey reports no history of alcohol use.   Review of Systems CONSTITUTIONAL: No weight loss, fever, chills, weakness or fatigue.  HEENT: Eyes: No visual loss, blurred vision, double vision or yellow sclerae.No hearing loss, sneezing, congestion, runny nose or sore throat.  SKIN: No rash or itching.  CARDIOVASCULAR: perhpi RESPIRATORY: per hpi GASTROINTESTINAL: No anorexia, nausea, vomiting or diarrhea. No abdominal pain or blood.  GENITOURINARY: No burning on urination, no polyuria NEUROLOGICAL: No headache, dizziness, syncope, paralysis, ataxia, numbness or tingling in the extremities. No change in bowel or bladder control.  MUSCULOSKELETAL: No muscle, back pain, joint pain or stiffness.  LYMPHATICS: No enlarged nodes. No history of splenectomy.  PSYCHIATRIC: No history of depression or anxiety.  ENDOCRINOLOGIC: No reports of sweating, cold or heat intolerance. No polyuria or polydipsia.   Marland Kitchen   Physical Examination Today's Vitals   03/03/21 1002  BP: (!) 110/58  Pulse: 76  SpO2: 98%  Weight: 152 lb 9.6 oz (69.2 kg)  Height: _0  (1.549 m)   Body mass index is 28.83 kg/m.  Gen: resting comfortably, no acute distress HEENT: no scleral icterus, pupils equal round and reactive, no palptable cervical adenopathy,  CV: irreg, 3/6 systolic murmur rusb, no jvd Resp: Clear to auscultation bilaterally GI: abdomen is soft, non-tender, non-distended, normal bowel sounds, no hepatosplenomegaly MSK: extremities are warm, no edema.  Skin: warm, no rash Neuro:  no focal deficits Psych: appropriate affect   Diagnostic Studies   10/2013 Echo Study Conclusions  - Left ventricle: The cavity size was normal. Wall thickness was   increased in  a pattern of moderate LVH. Systolic function was   normal. The estimated ejection fraction was in the range of 55%   to 60%. Wall motion was normal; there were no regional wall   motion abnormalities. Doppler parameters are consistent with   elevated ventricular end-diastolic filling pressure. - Aortic valve: Mildly calcified annulus. Trileaflet; mildly to   moderately calcified leaflets. Cusp separation was mildly   reduced. There was mild to moderate stenosis. There was mild   regurgitation. Mean gradient (S): 11 mm Hg. Peak gradient (S): 23   mm Hg. VTI ratio of LVOT to aortic valve: 0.4. Valve area (VTI):   1.24 cm^2. Valve area (Vmax): 1.32 cm^2. Planimetry valve area   1.8 square cm. - Mitral valve: Calcified annulus. There was mild regurgitation.   Valve area by pressure half-time: 2.2 cm^2. - Left atrium: The atrium was severely dilated. - Right atrium: The atrium was severely dilated. Central venous   pressure (est): 3 mm Hg. - Atrial septum: No defect or patent foramen ovale was identified. - Tricuspid valve: There was mild regurgitation. - Pulmonary arteries: PA peak pressure: 35 mm Hg (S). - Pericardium, extracardiac:  There was no pericardial effusion.  Impressions:  - Moderate LVH with LVEF 55-60%, indeterminate diastolic function   with evidence of increased LVEDP. Severe biatrial enlargement.   Moderate MAC with mild mitral regurgitation. Mild to moderate,   calcific aortic stenosis as outlined above with mild aortic   regurgitation. Mild tricuspid regurgitation with PASP 35 mmHg.   08/2016 echo Study Conclusions   - Left ventricle: The cavity size was normal. Wall thickness was   increased in a pattern of mild LVH. Systolic function was normal.   The estimated ejection fraction was in the range of 55% to 60%. - Aortic valve: AV is thickened, calcified with mildly restricted   motion Peak and mean gradients through the valve are 30 and 17 mm   Hg respectively. There was mild regurgitation. - Mitral valve: Calcified annulus. Mildly thickened leaflets .   There was mild regurgitation. - Left atrium: The atrium was mildly dilated. - Right atrium: The atrium was mildly dilated. - Pulmonary arteries: PA peak pressure: 49 mm Hg (S).   12/2020 echo  IMPRESSIONS     1. Left ventricular ejection fraction, by estimation, is 60 to 65%. The  left ventricle has normal function. The left ventricle has no regional  wall motion abnormalities. Left ventricular diastolic parameters are  indeterminate. Basal septal hypertrophy  noted.   2. Right ventricular systolic function is normal. The right ventricular  size is normal. There is mildly elevated pulmonary artery systolic  pressure.   3. Left atrial size was severely dilated.   4. Right atrial size was severely dilated.   5. Mitral Valve area by continuity equation 1.79 cm; this is in the  setting of aortic insuffiency. The mitral valve is degenerative. Mild  mitral valve regurgitation. Mild mitral stenosis. The mean mitral valve  gradient is 10.0 mmHg with average heart  rate of 96 bpm.   6. The aortic valve is calcified. Aortic valve  regurgitation is mild to  moderate. Moderate to severe aortic valve stenosis. Aortic valve area, by  VTI measures 0.95 cm. Aortic valve mean gradient measures 29.0 mmHg.  Aortic valve Vmax measures 3.46 m/s.  Normal LV stroke volume with with DVI 0.3   7. The inferior vena cava is dilated in size with >50% respiratory  variability, suggesting right atrial pressure of 8 mmHg.  12/2020 nuclear stress The study is normal. The study is low risk.   No ST deviation was noted. The ECG was negative for ischemia.   LV perfusion is normal.   Left ventricular function is normal. Nuclear stress EF: 71 %.   Low risk study with no ischemia and normal LVEF at 71%.   01/2021 RHC/LHC   There is moderate aortic valve stenosis.   1.  Patent coronary arteries with no significant stenoses.  Mild calcification noted. 2.  Moderate to severe aortic stenosis with mean transvalvular gradient 28 mmHg and calculated aortic valve area 1.1 cm 3.  Normal right heart hemodynamics   Recommend: CT angiography studies, valve team review, consideration of further treatment options might include commercial TAVR versus enrollment in the Progress moderate aortic stenosis TAVR trial.  01/2021 echo IMPRESSIONS     1. See full echo done 12/27/20 regarding valvular heart disease.   2. Left ventricular ejection fraction, by estimation, is 60 to 65%. The  left ventricle has normal function. The left ventricle has no regional  wall motion abnormalities. There is moderate left ventricular hypertrophy.   3. Right ventricular systolic function is normal. The right ventricular  size is normal.   4. Left atrial size was moderately dilated.   5. No color or bubble study done.   6. Right atrial size was moderately dilated.   7. Limited echo known MS/MR no doppler or color flow done . The mitral  valve is degenerative. No evidence of mitral valve regurgitation. No  evidence of mitral stenosis. Moderate mitral annular  calcification.   8. Limited echo no doppler / color done known AS/AR . The aortic valve is  tricuspid. There is moderate calcification of the aortic valve. There is  moderate thickening of the aortic valve. Aortic valve regurgitation is not  visualized. No aortic stenosis  is present.   9. The inferior vena cava is dilated in size with >50% respiratory  variability, suggesting right atrial pressure of 8 mmHg.     2D Echo 10/14/2020: 1. Small LV cavity (normal by index) and systolic function. Estimated LVEF 61% by MOD. Normal RV size and systolic function.  2. Moderate-severe AS with averaged mean PG 77mHg (AVA 0.72cm^2) and mild-moderate AR. The SVI is 30 ml/m2, AVAI is 0.4 cm2/m2, DVI 0.21 all which suggest the posibility of low-gradient severe AS.  3. Mild MS with averaged mean PG 5107mg (HR 85bpm) and mild MR. Mild-moderate TR. Estimated RVSP 4673m; RAP 10m58m Trace PR.  4. No prior studies for comparison.    Assessment and Plan  1. Afib - recent CVA in setting of subtherapeutic INR, has had to be off coumadin for a period recently for invasive testing for her heart valve - prior nose bleeds on eliquis, she has not been interested in retrying.  - would plan for lovenox bridging going forward if needed to be off coumadin in the future.    2. Aortic stenosis - moderate boderline severe, she report fatigue and dyspnea - ongoing workup in valve clinic, defer management to Dr CoopBurt Knack BartCyndia Bents appt with Dr BartCyndia Bentt week        JonaArnoldo LenisD.

## 2021-03-03 NOTE — Patient Instructions (Signed)
Take warfarin 1 1/2 tablets tonight then increase dose to 1 tablet daily except 1/2 tablet on Wednesdays Recheck INR in 3 weeks.

## 2021-03-03 NOTE — Patient Instructions (Signed)
Medication Instructions:  Continue all other medications.     Labwork: none  Testing/Procedures: none  Follow-Up: 6 months   Any Other Special Instructions Will Be Listed Below (If Applicable).   If you need a refill on your cardiac medications before your next appointment, please call your pharmacy.  

## 2021-03-14 ENCOUNTER — Encounter: Payer: Self-pay | Admitting: Physician Assistant

## 2021-03-14 DIAGNOSIS — Z1283 Encounter for screening for malignant neoplasm of skin: Secondary | ICD-10-CM | POA: Diagnosis not present

## 2021-03-14 DIAGNOSIS — L570 Actinic keratosis: Secondary | ICD-10-CM | POA: Diagnosis not present

## 2021-03-14 DIAGNOSIS — Z85828 Personal history of other malignant neoplasm of skin: Secondary | ICD-10-CM | POA: Diagnosis not present

## 2021-03-16 ENCOUNTER — Encounter: Payer: Medicare HMO | Admitting: Surgery

## 2021-03-21 ENCOUNTER — Ambulatory Visit: Payer: Medicare HMO | Admitting: Cardiovascular Disease

## 2021-03-21 ENCOUNTER — Encounter: Payer: Self-pay | Admitting: Cardiovascular Disease

## 2021-03-21 ENCOUNTER — Other Ambulatory Visit: Payer: Self-pay

## 2021-03-21 VITALS — BP 118/72 | HR 84 | Ht 61.0 in | Wt 156.8 lb

## 2021-03-21 DIAGNOSIS — I060 Rheumatic aortic stenosis: Secondary | ICD-10-CM | POA: Diagnosis not present

## 2021-03-21 DIAGNOSIS — R634 Abnormal weight loss: Secondary | ICD-10-CM | POA: Diagnosis not present

## 2021-03-21 DIAGNOSIS — I4819 Other persistent atrial fibrillation: Secondary | ICD-10-CM

## 2021-03-21 DIAGNOSIS — I050 Rheumatic mitral stenosis: Secondary | ICD-10-CM | POA: Diagnosis not present

## 2021-03-21 NOTE — Progress Notes (Signed)
Cardiology Office Note:    Date:  03/21/2021   ID:  Anne Wolf, Anne Wolf Jan 01, 1934, MRN [id number]  PCP:  Asencion Noble, MD   [facility] Cardiologist:  Carlyle Dolly, MD     Referring MD: Asencion Noble, MD   Chief Complaint  Patient presents with   Shortness of Breath    History of Present Illness:    Anne Wolf is a 88 y.o. female with a hx of aortic stenosis, presenting for follow-up evaluation.  I initially saw the patient January 18, 2021 in consultation for treatment options regarding severe aortic stenosis.  The patient initially presented with acute shortness of breath when she was traveling to visit her son in [place].  She was found to have paradoxical low-flow low gradient aortic stenosis during her hospital admission when she was treated for acute pulmonary edema.  When she returned from [place], an echocardiogram was completed on December 27, 2020 demonstrating an LVEF of 60 to 65%.  She was noted to have mild to moderate mitral stenosis with a mean transmitral gradient of 10 mmHg and mitral valve area by continuity equation of 1.8 cm.  She was felt to have moderate to severe aortic stenosis with a mean transaortic gradient of 29 mmHg, dimensionless index of 0.3 and normal LV stroke-volume.  Cardiac catheterization was completed and demonstrated patent coronary arteries with moderate to severe aortic stenosis mean transvalvular gradient 28 mmHg calculated valve area 1.1 cm and normal right heart hemodynamics.  A few weeks after her cardiac catheterization she had presented with acute stroke symptoms.  Her symptoms resolved without specific treatment.  The pre-TAVR CTA studies demonstrated left atrial appendage thrombus.  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  She will require bridging moving forward.  The patient has  completely recovered from her stroke with no residual deficit.  She is here with her family today.  This includes her husband and son who are both present.  Her other son is conferenced in from [place] on FaceTime.  The patient reports continued fatigue and mild shortness of breath with physical activity.  Feels like she has trouble taking a deep breath at nighttime.  She does not have orthopnea or PND.  She has no edema.  She has no chest pain or pressure.  She denies lightheadedness or syncope.  One of her sons brings up the fact that her nutritional status is not good.  She has lost 20 pounds over the last few years and they report that she does not eat well.  She eats low calories but not the right foods per their report.  They would like her to see a nutritionist to see if education would be helpful for her.  Past Medical History:  Diagnosis Date   Aortic stenosis    mild; mild MR; slightly increased pulmonary artery pressure with mild RVH; normal LV-2010   Arthritis    Atrial fibrillation (HCC)    chronic anticoagulation; adequate HR control on minimal AV nodal blocking medication    Cancer ([place])    skin cancers removed from face   CHF (congestive heart failure) ([place])    Diabetes mellitus without complication ([place])    2   Fasting hyperglycemia    + microalbuminuria; A1c of 6.5% in 11/2010   Hematochezia 01/2009   01/2009-presumed ischemic colitis; history of diverticulosis   History of kidney stones  Hx of adenomatous colonic polyps 2005   adenomatous polyp; negative colonoscopy in 2009   Hyperlipidemia     Past Surgical History:  Procedure Laterality Date   CATARACT EXTRACTION W/PHACO Right 03/23/2014   Procedure: CATARACT EXTRACTION PHACO AND INTRAOCULAR LENS PLACEMENT ([place]);  Surgeon: Williams Che, MD;  Location: [facility];  Service: Ophthalmology;  Laterality: Right;  CDE:2.83   CATARACT EXTRACTION W/PHACO Left 07/27/2014   Procedure: CATARACT EXTRACTION PHACO AND  INTRAOCULAR LENS PLACEMENT LEFT EYE CDE=6.97;  Surgeon: Williams Che, MD;  Location: [facility];  Service: Ophthalmology;  Laterality: Left;   COLONOSCOPY  2009   negative; prior study with adenomatous polyp   CYSTOSCOPY/URETEROSCOPY/HOLMIUM LASER/STENT PLACEMENT Right 08/21/2016   Procedure: CYSTOSCOPY/URETEROSCOPY/RETROGRADE PYELOGRAM//STENT PLACEMENT;  Surgeon: Nickie Retort, MD;  Location: [facility];  Service: Urology;  Laterality: Right;   EYE SURGERY     RIGHT/LEFT HEART CATH AND CORONARY ANGIOGRAPHY N/A 02/04/2021   Procedure: RIGHT/LEFT HEART CATH AND CORONARY ANGIOGRAPHY;  Surgeon: Sherren Mocha, MD;  Location: [facility];  Service: Cardiovascular;  Laterality: N/A;    Current Medications: Current Meds  Medication Sig   alendronate (FOSAMAX) 70 MG tablet Take 70 mg by mouth once a week.   Biotin 5000 MCG CAPS Take 5,000 mcg by mouth daily.   calcium carbonate (OSCAL) 1500 (600 Ca) MG TABS tablet Take 600 mg of elemental calcium by mouth daily.   cholecalciferol (VITAMIN D) 1000 units tablet Take 1,000 Units by mouth at bedtime.   Co-Enzyme Q-10 100 MG CAPS Take 100 mg by mouth at bedtime.   lisinopril (PRINIVIL,ZESTRIL) 10 MG tablet Take 10 mg by mouth at bedtime.   loratadine (CLARITIN) 10 MG tablet Take 10 mg by mouth at bedtime.   metFORMIN (GLUCOPHAGE) 500 MG tablet Take 500 mg by mouth 2 (two) times daily with a meal.    metoprolol succinate (TOPROL-XL) 25 MG 24 hr tablet Take 25 mg by mouth at bedtime.   Multiple Vitamins-Minerals (CENTRUM SILVER) tablet Take 1 tablet by mouth at bedtime.    simvastatin (ZOCOR) 40 MG tablet Take 40 mg by mouth at bedtime.     warfarin (COUMADIN) 2 MG tablet Take 1 tablet daily except 1/2 tablet on Wednesdays and Saturdays or as directed (Patient taking differently: Take 1-2 mg by mouth See admin instructions. Take 1 mg on Monday andWednesday. All the other days take 2 mg in the evening)     Allergies:   Cat hair extract   Social  History   Socioeconomic History   Marital status: Married    Spouse name: Not on file   Number of children:  2   Years of education: Not on file   Highest education level: Not on file  Occupational History   Occupation: Retired  Tobacco Use   Smoking status: Never   Smokeless tobacco: Never  Vaping Use   Vaping Use: Never used  Substance and Sexual Activity   Alcohol use: No   Drug use: No   Sexual activity: Not Currently  Other Topics Concern   Not on file  Social History Narrative   Married with 2 children   No regular exercise   Social Determinants of Health   Financial Resource Strain: Not on file  Food Insecurity: Not on file  Transportation Needs: Not on file  Physical Activity: Not on file  Stress: Not on file  Social Connections: Not on file     Family History: The patient's family history includes Arrhythmia in  her sister; Cancer in her father.  ROS:   Please see the history of present illness.    All other systems reviewed and are negative.  EKGs/Labs/Other Studies Reviewed:    The following studies were reviewed today: Cardiac Cath: Conclusion      There is moderate aortic valve stenosis.   1.  Patent coronary arteries with no significant stenoses.  Mild calcification noted. 2.  Moderate to severe aortic stenosis with mean transvalvular gradient 28 mmHg and calculated aortic valve area 1.1 cm 3.  Normal right heart hemodynamics   Recommend: CT angiography studies, valve team review, consideration of further treatment options might include commercial TAVR versus enrollment in the Progress moderate aortic stenosis TAVR trial.  Left Heart  Aortic Valve There is moderate aortic valve stenosis. The aortic valve is calcified. There is restricted aortic valve motion.   Coronary Diagrams  Diagnostic Dominance: Right Intervention  Implants     No implant documentation for this case.   Syngo Images   Show images for CARDIAC  CATHETERIZATION  Images on Long Term Storage   Show images for Anne Wolf, Anne Wolf  Link to Procedure Log  Procedure Log     Hemo Data  Flowsheet Row Most Recent Value  Fick Cardiac Output 5.71 L/min  Fick Cardiac Output Index 3.42 (L/min)/BSA  Aortic Mean Gradient 27.9 mmHg  Aortic Peak Gradient 31 mmHg  Aortic Valve Area 1.10  Aortic Value Area Index 0.66 cm2/BSA  RA A Wave 7 mmHg  RA V Wave 7 mmHg  RA Mean 7 mmHg  RV Systolic Pressure 40 mmHg  RV Diastolic Pressure 2 mmHg  RV EDP 7 mmHg  PA Systolic Pressure 39 mmHg  PA Diastolic Pressure 18 mmHg  PA Mean 30 mmHg  PW A Wave 20 mmHg  PW V Wave 24 mmHg  PW Mean 20 mmHg  LV Systolic Pressure 267 mmHg  LV Diastolic Pressure 2 mmHg  LV EDP 12 mmHg  AOp Systolic Pressure 124 mmHg  AOp Diastolic Pressure 53 mmHg  AOp Mean Pressure 78 mmHg  LVp Systolic Pressure 580 mmHg  LVp Diastolic Pressure 3 mmHg  LVp EDP Pressure 10 mmHg  QP/QS 1  TPVR Index 8.78 HRUI    Echo: IMPRESSIONS     1. Left ventricular ejection fraction, by estimation, is 60 to 65%. The  left ventricle has normal function. The left ventricle has no regional  wall motion abnormalities. Left ventricular diastolic parameters are  indeterminate. Basal septal hypertrophy  noted.   2. Right ventricular systolic function is normal. The right ventricular  size is normal. There is mildly elevated pulmonary artery systolic  pressure.   3. Left atrial size was severely dilated.   4. Right atrial size was severely dilated.   5. Mitral Valve area by continuity equation 1.79 cm; this is in the  setting of aortic insuffiency. The mitral valve is degenerative. Mild  mitral valve regurgitation. Mild mitral stenosis. The mean mitral valve  gradient is 10.0 mmHg with average heart  rate of 96 bpm.   6. The aortic valve is calcified. Aortic valve regurgitation is mild to  moderate. Moderate to severe aortic valve stenosis. Aortic valve area, by  VTI  measures 0.95 cm. Aortic valve mean gradient measures 29.0 mmHg.  Aortic valve Vmax measures 3.46 m/s.  Normal LV stroke volume with with DVI 0.3   7. The inferior vena cava is dilated in size with >50% respiratory  variability, suggesting right atrial pressure of 8 mmHg.  Comparison(s): A prior study was performed on 07/07/2020. Similar mitral  and aortic gradients.   FINDINGS   Left Ventricle: Basal septal hypertrophy noted. Left ventricular ejection  fraction, by estimation, is 60 to 65%. The left ventricle has normal  function. The left ventricle has no regional wall motion abnormalities.  The left ventricular internal cavity  size was normal in size. There is no left ventricular hypertrophy. Left  ventricular diastolic parameters are indeterminate.   Right Ventricle: The right ventricular size is normal. No increase in  right ventricular wall thickness. Right ventricular systolic function is  normal. There is mildly elevated pulmonary artery systolic pressure. The  tricuspid regurgitant velocity is 2.88   m/s, and with an assumed right atrial pressure of 8 mmHg, the estimated  right ventricular systolic pressure is 69.6 mmHg.   Left Atrium: Left atrial size was severely dilated.   Right Atrium: Right atrial size was severely dilated.   Pericardium: There is no evidence of pericardial effusion.   Mitral Valve: Mitral Valve area by continuity equation 1.79 cm; this is  in the setting of aortic insuffiency. The mitral valve is degenerative in  appearance. Mild mitral valve regurgitation. Mild mitral valve stenosis.  The mean mitral valve gradient is  10.0 mmHg with average heart rate of 96 bpm.   Tricuspid Valve: The tricuspid valve is normal in structure. Tricuspid  valve regurgitation is mild.   Aortic Valve: Normal LV stroke volume with with DVI 0.3. The aortic valve  is calcified. Aortic valve regurgitation is mild to moderate. Aortic  regurgitation PHT measures 320  msec. Moderate to severe aortic stenosis is  present. Aortic valve mean gradient  measures 29.0 mmHg. Aortic valve peak gradient measures 47.9 mmHg. Aortic  valve area, by VTI measures 0.95 cm.   Pulmonic Valve: The pulmonic valve was not well visualized. Pulmonic valve  regurgitation is trivial. No evidence of pulmonic stenosis.   Aorta: The aortic root is normal in size and structure.   Venous: The inferior vena cava is dilated in size with greater than 50%  respiratory variability, suggesting right atrial pressure of 8 mmHg.   IAS/Shunts: The atrial septum is grossly normal.      LEFT VENTRICLE  PLAX 2D  LVIDd:         3.90 cm  LVIDs:         2.60 cm  LV PW:         1.00 cm  LV IVS:        1.40 cm  LVOT diam:     2.00 cm  LV SV:         75  LV SV Index:   43  LVOT Area:     3.14 cm      RIGHT VENTRICLE  RV S prime:     10.80 cm/s  TAPSE (M-mode): 1.7 cm   LEFT ATRIUM              Index        RIGHT ATRIUM           Index  LA diam:        4.20 cm  2.38 cm/m   RA Area:     24.80 cm  LA Vol (A2C):   105.0 ml 59.42 ml/m  RA Volume:   80.10 ml  45.33 ml/m  LA Vol (A4C):   69.2 ml  39.16 ml/m  LA Biplane Vol: 90.1 ml  50.99 ml/m   AORTIC VALVE  AV Area (Vmax):    0.98 cm  AV Area (Vmean):   1.03 cm  AV Area (VTI):     0.95 cm  AV Vmax:           346.00 cm/s  AV Vmean:          243.500 cm/s  AV VTI:            0.791 m  AV Peak Grad:      47.9 mmHg  AV Mean Grad:      29.0 mmHg  LVOT Vmax:         108.00 cm/s  LVOT Vmean:        79.600 cm/s  LVOT VTI:          0.240 m  LVOT/AV VTI ratio: 0.30  AI PHT:            320 msec     AORTA  Ao Root diam: 3.20 cm   MITRAL VALVE                TRICUSPID VALVE  MV Area (PHT): 2.22 cm     TR Peak grad:   33.2 mmHg  MV Mean grad:  10.0 mmHg    TR Vmax:        288.00 cm/s  MV Decel Time: 342 msec  MV E velocity: 201.00 cm/s  SHUNTS                              Systemic VTI:  0.24 m                               Systemic Diam: 2.00 cm   Gated cardiac CTA: Valve Morphology: The aortic valve is tricuspid. There is severe bulky calcification of the [place]. The leaflets demonstrate severely restricted leaflet motion in systole.   Aortic Valve Calcium score: 1382   Aortic annular dimension:   Phase assessed:  35%   Annular area: 393 mm2   Annular perimeter: 71.4 mm   Max diameter: 25.5 mm   Min diameter: 20.0 mm   Membranous septum length: 10.6 mm   Annular and subannular calcification: None.   Optimal coplanar projection: LAO 1 CAU 0   Coronary Artery Height above Annulus:   Left Main: 14.0 mm   Right Coronary: 13.2 mm   Sinus of Valsalva Measurements:   Non-coronary: 30.3 mm   Right-coronary: 30.1 mm   Left-coronary: 30.3 mm   Sinus of Valsalva Height:   Non-coronary: 22.4 mm   Right-coronary: 19.4 mm   Left-coronary: 21.5 mm   Sinotubular Junction: 28 mm   Ascending Thoracic Aorta: 31 mm   Coronary Arteries: Normal coronary origin. Right dominance. The study was performed without use of NTG and is insufficient for plaque evaluation. Please refer to recent cardiac catheterization for coronary assessment.   Cardiac Morphology:   Right Atrium: Right atrial size is dilated. Contrast reflux into the IVC consistent with elevated RAP.   Right Ventricle: The right ventricular cavity is within normal limits.   Left Atrium: Left atrial size is dilated. There is mixing artifact in the LAA, cannot exclude thrombus.   Left Ventricle: The ventricular cavity size is within normal limits. There are no stigmata of prior infarction. There is no abnormal filling defect.   Pulmonary veins: Normal pulmonary venous drainage.   Pericardium: Normal thickness with no significant effusion  or calcium present.   Mitral Valve: The mitral valve is rheumatic with diffuse severe thickening. There is moderate calcification of the PMVL.   Extra-cardiac findings: See attached  radiology report for non-cardiac structures.   IMPRESSION: 1. Tricuspid aortic valve with severe bulky calcification of the NCC.   2. Annular measurements appropriate for 23 mm S3 TAVR (393 mm2).   3. No significant annular or subannular calcifications.   4. Sufficient coronary to annulus distance.   5. Optimal Fluoroscopic Angle for Delivery.   6. 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 There is moderate calcification of the PMVL.   7. There is mixing artifact in the LAA, cannot exclude thrombus. In the setting of rheumatic MV disease, would recommend TEE if clinically indicated to exclude thrombus.   8. Dilated pulmonary artery suggestive of pulmonary hypertension.   CTA Chest/Abd/Pelvis: Minimal Aortic Diameter-14 x 13 mm   Severity of Aortic Calcification-mild to moderate   RIGHT PELVIS:   Right Common Iliac Artery -   Minimal Diameter-9.9 x 10.2 mm   Tortuosity-mild   Calcification-mild   Right External Iliac Artery -   Minimal Diameter-6.8 x 6.8 mm   Tortuosity-mild-to-moderate   Calcification-none   Right Common Femoral Artery -   Minimal Diameter-7.6 x 7.7 mm   Tortuosity-mild   Calcification-mild   LEFT PELVIS:   Left Common Iliac Artery -   Minimal Diameter-10.0 x 10.3 mm   Tortuosity-mild   Calcification-mild   Left External Iliac Artery -   Minimal Diameter-6.8 x 6.8 mm   Tortuosity-mild   Calcification-none   Left Common Femoral Artery -   Minimal Diameter-7.7 x 7.8 mm   Tortuosity-mild   Calcification-mild   Review of the MIP images confirms the above findings.   IMPRESSION: 1. Vascular findings and measurements pertinent to potential TAVR procedure, as detailed above. 2. Thickening calcification of the aortic valve, compatible with reported clinical history of severe aortic stenosis. 3. Cardiomegaly with left atrial dilatation, with filling defect in the left atrial appendage concerning for  left atrial appendage thrombus. Correlation with transesophageal echocardiography is recommended to exclude the presence of thrombus if not already performed. 4. Morphologic changes in the liver indicative of early cirrhosis. 5. Colonic diverticulosis without evidence of acute diverticulitis at this time. 6. Additional incidental findings, as above.     Recent Labs: 02/18/2021: ALT 20; BUN 15; Creatinine, Ser 0.55; Hemoglobin 12.6; Magnesium 1.9; Platelets 195; Potassium 3.9; Sodium 139  Recent Lipid Panel    Component Value Date/Time   CHOL 152 02/18/2021 0507   TRIG 69 02/18/2021 0507   TRIG 127 03/24/2008 0000   HDL 52 02/18/2021 0507   CHOLHDL 2.9 02/18/2021 0507   VLDL 14 02/18/2021 0507   LDLCALC 86 02/18/2021 0507   LDLCALC 108 03/24/2008 0000     Risk Assessment/Calculations:    CHA2DS2-VASc Score = 6   This indicates a 9.7% annual risk of stroke. The patient's score is based upon: CHF History: 1 HTN History: 1 Diabetes History: 1 Stroke History: 0 Vascular Disease History: 0 Age Score: 2 Gender Score: 1          Physical Exam:    VS:  BP 118/72    Pulse 84    Ht 5\' 1"  (1.549 m)    Wt 156 lb 12.8 oz (71.1 kg)    SpO2 95%    BMI 29.63 kg/m     Wt Readings from Last 3 Encounters:  03/21/21 156 lb 12.8 oz (71.1  kg)  03/03/21 152 lb 9.6 oz (69.2 kg)  02/17/21 152 lb 5.4 oz (69.1 kg)     GEN:  Well nourished, well developed pleasant elderly woman in no acute distress HEENT: Normal NECK: No JVD; No carotid bruits LYMPHATICS: No lymphadenopathy CARDIAC: RRR, 2/6 harsh crescendo decrescendo murmur at the right upper sternal border RESPIRATORY:  Clear to auscultation without rales, wheezing or rhonchi  ABDOMEN: Soft, non-tender, non-distended MUSCULOSKELETAL:  No edema; No deformity  SKIN: Warm and dry NEUROLOGIC:  Alert and oriented x 3 PSYCHIATRIC:  Normal affect   ASSESSMENT:    1. Weight loss observed on examination   2. Rheumatic aortic  stenosis   3. Rheumatic mitral stenosis   4. Persistent atrial fibrillation (HCC)    PLAN:    In order of problems listed above:  Counseled the patient today.  Referred her for outpatient nutrition consultation. The patient appears to have severe stage D3 paradoxical low-flow low gradient aortic stenosis with New York Heart Association functional class II symptoms of fatigue and exertional dyspnea.  I again reviewed the natural history of aortic stenosis with the patient and her family members today.  We discussed treatment options in the context of her medical comorbidities, advanced age, and recent stroke.  Fortunately she has had a very good recovery from her stroke.  She appears to have rheumatic valvular disease with both mitral and aortic stenosis.  They understand that treatment decisions are more complicated because of the presence of what is considered at least moderate mitral valve stenosis.  Echo and cath data is again reviewed.  Cardiac catheterization demonstrated widely patent coronary arteries with mildly elevated intracardiac pressures with a mean PA pressure of 30 mmHg and a mean pulmonary capillary wedge pressure of 20 mmHg.  At age 55, I do not think she would be a candidate for double valve surgery.  Alternatively, it might be reasonable to consider TAVR as a treatment for her aortic stenosis.  They understand that she might continue to have some functional limitation related to mitral valve stenosis but this does not appear to be in the severe range at present.  She appears to have anatomy suitable for a 23 mm SAPIEN 3 valve via transfemoral access.  The patient will be set up to see Dr. Cyndia Bent in about 2 months.  This will give her some more time out from her stroke and I will arrange a follow-up echocardiogram prior to that visit.  I would anticipate that we move forward with TAVR in about 3 months time and the family is agreeable with this.  They would also like her to have a  nutrition consult and to work on her nutritional status a bit before undergoing TAVR. As above, I would anticipate medical therapy and surveillance as I do not think she will be a candidate for surgical treatment of mitral stenosis. The patient understands she will require Lovenox bridging if there is any interruption of her anticoagulation moving forward.  She will continue on warfarin.  She was unable to tolerate apixaban because of epistaxis.  PLAN: Outpatient nutrition consult per family request Follow-up Dr. Cyndia Bent 2 months with an echocardiogram prior to that appointment Tentatively plan on TAVR in about 3 months pending Dr. Vivi Martens evaluation           Medication Adjustments/Labs and Tests Ordered: Current medicines are reviewed at length with the patient today.  Concerns regarding medicines are outlined above.  Orders Placed This Encounter  Procedures   Ambulatory  referral to Nutrition and Diabetic Education   No orders of the defined types were placed in this encounter.   Patient Instructions  Medication Instructions:  Your physician recommends that you continue on your current medications as directed. Please refer to the Current Medication list given to you today.  *If you need a refill on your cardiac medications before your next appointment, please call your pharmacy*   Lab Work: NONE If you have labs (blood work) drawn today and your tests are completely normal, you will receive your results only by: [place] (if you have MyChart) OR A paper copy in the mail If you have any lab test that is abnormal or we need to change your treatment, we will call you to review the results.   Testing/Procedures: Dr. Burt Knack would like to you have a repeat ECHO prior to seeing Dr Cyndia Bent. Structural team will arrange these appointments and contact you.   Follow-Up: Structural team will follow-up Ambulatory Referral to Nutritional Management    Signed, Sherren Mocha,  MD  03/21/2021 9:21 AM    [place]

## 2021-03-21 NOTE — Patient Instructions (Signed)
Medication Instructions:  Your physician recommends that you continue on your current medications as directed. Please refer to the Current Medication list given to you today.  *If you need a refill on your cardiac medications before your next appointment, please call your pharmacy*   Lab Work: NONE If you have labs (blood work) drawn today and your tests are completely normal, you will receive your results only by: [place] (if you have MyChart) OR A paper copy in the mail If you have any lab test that is abnormal or we need to change your treatment, we will call you to review the results.   Testing/Procedures: Dr. Burt Knack would like to you have a repeat ECHO prior to seeing Dr Cyndia Bent. Structural team will arrange these appointments and contact you.   Follow-Up: Structural team will follow-up Ambulatory Referral to Nutritional Management

## 2021-03-21 NOTE — Addendum Note (Signed)
Addended by: Harland German A on: 03/21/2021 02:41 PM   Modules accepted: Orders

## 2021-04-06 ENCOUNTER — Encounter: Payer: Medicare HMO | Attending: Cardiovascular Disease | Admitting: Nutrition

## 2021-04-06 ENCOUNTER — Encounter: Payer: Self-pay | Admitting: Nutrition

## 2021-04-06 ENCOUNTER — Other Ambulatory Visit: Payer: Self-pay

## 2021-04-06 VITALS — Ht 60.0 in | Wt 153.4 lb

## 2021-04-06 DIAGNOSIS — E119 Type 2 diabetes mellitus without complications: Secondary | ICD-10-CM | POA: Diagnosis not present

## 2021-04-06 DIAGNOSIS — R634 Abnormal weight loss: Secondary | ICD-10-CM | POA: Insufficient documentation

## 2021-04-06 DIAGNOSIS — E118 Type 2 diabetes mellitus with unspecified complications: Secondary | ICD-10-CM

## 2021-04-06 DIAGNOSIS — E782 Mixed hyperlipidemia: Secondary | ICD-10-CM

## 2021-04-06 DIAGNOSIS — I10 Essential (primary) hypertension: Secondary | ICD-10-CM

## 2021-04-06 DIAGNOSIS — G459 Transient cerebral ischemic attack, unspecified: Secondary | ICD-10-CM

## 2021-04-06 NOTE — Progress Notes (Signed)
Medical Nutrition Therapy  Appointment Start time:  0800  Appointment End time:  0900  Primary concerns today: Weight loss, diabetes type 2 Referral diagnosis: R63.4, E11.8 Preferred learning style: read  Learning readiness: Ready   NUTRITION ASSESSMENT  Her with her husband. Concerns are related to losing weight and lack of energy. H/O AFIB and heart related issues  DM Type 2, FBS  100's. Testing once a day in am. A1C 5.7% On Metformin 500 mg BID. Walks a mile most days with her husband. Hasn't felt like she has much energy lately. Appetite fair. Has lost 20 lbs in the last year or 2.  Anthropometrics  Wt Readings from Last 3 Encounters:  04/06/21 153 lb 6.4 oz (69.6 kg)  03/21/21 156 lb 12.8 oz (71.1 kg)  03/03/21 152 lb 9.6 oz (69.2 kg)   Ht Readings from Last 3 Encounters:  04/06/21 5' (1.524 m)  03/21/21 5\' 1"  (1.549 m)  03/03/21 5\' 1"  (1.549 m)   Body mass index is 29.96 kg/m. @BMIFA @ Facility age limit for growth percentiles is 20 years. Facility age limit for growth percentiles is 20 years.    Clinical Medical Hx: See chart: AFIB, DM Type 2, CVA Medications: see chart. Metformin 500 mg BID Labs:  Lab Results  Component Value Date   HGBA1C 5.7 (H) 02/18/2021   CMP Latest Ref Rng & Units 02/18/2021 02/17/2021 02/17/2021  Glucose 70 - 99 mg/dL 110(H) 108(H) 114(H)  BUN 8 - 23 mg/dL 15 17 18   Creatinine 0.44 - 1.00 mg/dL 0.55 0.70 0.67  Sodium 135 - 145 mmol/L 139 139 135  Potassium 3.5 - 5.1 mmol/L 3.9 3.7 3.7  Chloride 98 - 111 mmol/L 106 102 101  CO2 22 - 32 mmol/L 25 - 25  Calcium 8.9 - 10.3 mg/dL 9.3 - 9.3  Total Protein 6.5 - 8.1 g/dL 6.5 - 7.3  Total Bilirubin 0.3 - 1.2 mg/dL 0.3 - 0.5  Alkaline Phos 38 - 126 U/L 43 - 46  AST 15 - 41 U/L 30 - 33  ALT 0 - 44 U/L 20 - 23   Lipid Panel     Component Value Date/Time   CHOL 152 02/18/2021 0507   TRIG 69 02/18/2021 0507   TRIG 127 03/24/2008 0000   HDL 52 02/18/2021 0507   CHOLHDL 2.9 02/18/2021  0507   VLDL 14 02/18/2021 0507   LDLCALC 86 02/18/2021 0507   LDLCALC 108 03/24/2008 0000    Notable Signs/Symptoms: fatigue, weak, weight loss of 20 lbs over the last year or so. Doesn't have as much of an appetite at times. Sometimes skips meals due to not being hungry.  Lifestyle & Dietary Hx  Lives with her husband. He usually cooks. She is fatigued often. Eats 2- 3 meals per day.  Lost 20 lbs over the last year or two after heart issues. DM well controlled. Tests BS once a day. Eats mostly at home, but does eat out some.  Estimated daily fluid intake: 40 oz Supplements: see chart Sleep: good Stress / self-care: her health Current average weekly physical activity: Had been walking a mile with her husband when she can.   24-Hr Dietary Recall First Meal: Raisin bran cereal Snack:  Second Meal: banana sandwich or leftovers, Snack: misc cookies or candy Third Meal: [place] usually. Had pizza= cheese, 3 slices,  water Snack:  Beverages: water,  occassional soda, tea  Estimated Energy Needs Calories: 1500  Carbohydrate: 170 g Protein: 112 g Fat: 42 g  NUTRITION DIAGNOSIS  NI-1.6 Predicted suboptional energy  As related to insuffient calorie intake.  As evidenced by 20 lbs weight loss in the last 1-2 years..   NUTRITION INTERVENTION  Nutrition education (E-1) on the following topics:  Lifestyle Medicine - Whole Food, Plant Predominant Nutrition is highly recommended: Eat Plenty of vegetables, Mushrooms, fruits, Legumes, Whole Grains, Nuts, seeds in lieu of processed meats, processed snacks/pastries red meat, poultry, eggs.    -It is better to avoid simple carbohydrates including: Cakes, Sweet Desserts, Ice Cream, Soda (diet and regular), Sweet Tea, Candies, Chips, Cookies, Store Bought Juices, Alcohol in Excess of  1-2 drinks a day, Lemonade,  Artificial Sweeteners, Doughnuts, Coffee Creamers, "Sugar-free" Products, etc, etc.  This is not a complete  list.....  Exercise: If you are able: 30 -60 minutes a day ,4 days a week, or 150 minutes a week.  The longer the better.  Combine stretch, strength, and aerobic activities.  If you were told in the past that you have high risk for cardiovascular diseases, you may seek evaluation by your heart doctor prior to initiating moderate to intense exercise programs.  Foods high in VIT K to be aware of and discuss with your cardiologist.  Nutrient dense foods, healthy calories and importance of balanced meals   Handouts Provided Include  Lifestyle nutrition Lifestyle plant based meals plan Vit K food list   Learning Style & Readiness for Change Teaching method utilized: Visual & Auditory  Demonstrated degree of understanding via: Teach Back  Barriers to learning/adherence to lifestyle change: none  Goals Established by Pt Goals  Eat more plant based foods.  Check with cardiologist about foods high in VIT K to be careful with. \  Eat three meals and  2 small snacks between meals per day   Maintain weight between 155-160 lbs.   Take Metformin AFTER breakfast and not before.  Lifestyle Medicine - Whole Food, Plant Predominant Nutrition is highly recommended: Eat Plenty of vegetables, Mushrooms, fruits, Legumes, Whole Grains, Nuts, seeds in lieu of processed meats, processed snacks/pastries red meat, poultry, eggs.    -It is better to avoid simple carbohydrates including: Cakes, Sweet Desserts, Ice Cream, Soda (diet and regular), Sweet Tea, Candies, Chips, Cookies, Store Bought Juices, Alcohol in Excess of  1-2 drinks a day, Lemonade,  Artificial Sweeteners, Doughnuts, Coffee Creamers, "Sugar-free" Products, etc, etc.  This is not a complete list.....  Exercise: If you are able: 30 -60 minutes a day ,4 days a week, or 150 minutes a week.  The longer the better.  Combine stretch, strength, and aerobic activities.  If you were told in the past that you have high risk for cardiovascular  diseases, you may seek evaluation by your heart doctor prior to initiating moderate to intense exercise programs.   MONITORING & EVALUATION Dietary intake, weekly physical activity, and weight in 1 month.  Discuss with cardiology about consumption and frequency of VIT K foods.  Next Steps  Patient is to work on eating meals more consistently and more nutrient dense foods.Marland Kitchen

## 2021-04-06 NOTE — Patient Instructions (Addendum)
Goals  Eat more plant based foods.  Check with cardiologist about foods high in VIT K to be careful with.   Eat three meals and  2 small snacks between meals per day   Maintain weight between 155-160 lbs.    Take Metformin after breakfast and not before.   Lifestyle Medicine - Whole Food, Plant Predominant Nutrition is highly recommended: Eat Plenty of vegetables, Mushrooms, fruits, Legumes, Whole Grains, Nuts, seeds in lieu of processed meats, processed snacks/pastries red meat, poultry, eggs.    -It is better to avoid simple carbohydrates including: Cakes, Sweet Desserts, Ice Cream, Soda (diet and regular), Sweet Tea, Candies, Chips, Cookies, Store Bought Juices, Alcohol in Excess of  1-2 drinks a day, Lemonade,  Artificial Sweeteners, Doughnuts, Coffee Creamers, "Sugar-free" Products, etc, etc.  This is not a complete list.....  Exercise: If you are able: 30 -60 minutes a day ,4 days a week, or 150 minutes a week.  The longer the better.  Combine stretch, strength, and aerobic activities.  If you were told in the past that you have high risk for cardiovascular diseases, you may seek evaluation by your heart doctor prior to initiating moderate to intense exercise programs.

## 2021-04-08 ENCOUNTER — Ambulatory Visit (INDEPENDENT_AMBULATORY_CARE_PROVIDER_SITE_OTHER): Payer: Medicare HMO | Admitting: *Deleted

## 2021-04-08 ENCOUNTER — Other Ambulatory Visit: Payer: Self-pay

## 2021-04-08 ENCOUNTER — Encounter (INDEPENDENT_AMBULATORY_CARE_PROVIDER_SITE_OTHER): Payer: Medicare HMO | Admitting: *Deleted

## 2021-04-08 DIAGNOSIS — Z5181 Encounter for therapeutic drug level monitoring: Secondary | ICD-10-CM | POA: Diagnosis not present

## 2021-04-08 DIAGNOSIS — I4819 Other persistent atrial fibrillation: Secondary | ICD-10-CM

## 2021-04-08 LAB — POCT INR
INR: 2 (ref 2.0–3.0)
INR: 2 (ref 2.0–3.0)

## 2021-04-08 NOTE — Patient Instructions (Signed)
Increase warfarin to 1 tablet daily  Recheck INR in 4 weeks.  

## 2021-04-08 NOTE — Progress Notes (Signed)
This encounter was created in error - please disregard.

## 2021-04-20 ENCOUNTER — Ambulatory Visit: Payer: Medicare HMO | Admitting: Nutrition

## 2021-05-05 ENCOUNTER — Ambulatory Visit (INDEPENDENT_AMBULATORY_CARE_PROVIDER_SITE_OTHER): Payer: Medicare HMO | Admitting: *Deleted

## 2021-05-05 DIAGNOSIS — I4819 Other persistent atrial fibrillation: Secondary | ICD-10-CM | POA: Diagnosis not present

## 2021-05-05 DIAGNOSIS — Z5181 Encounter for therapeutic drug level monitoring: Secondary | ICD-10-CM | POA: Diagnosis not present

## 2021-05-05 LAB — POCT INR: INR: 2.2 (ref 2.0–3.0)

## 2021-05-05 NOTE — Patient Instructions (Signed)
Continue warfarin 1 tablet daily  Recheck INR in 4 weeks.  

## 2021-05-16 DIAGNOSIS — Z0001 Encounter for general adult medical examination with abnormal findings: Secondary | ICD-10-CM | POA: Diagnosis not present

## 2021-05-16 DIAGNOSIS — E119 Type 2 diabetes mellitus without complications: Secondary | ICD-10-CM | POA: Diagnosis not present

## 2021-05-16 DIAGNOSIS — I482 Chronic atrial fibrillation, unspecified: Secondary | ICD-10-CM | POA: Diagnosis not present

## 2021-05-19 ENCOUNTER — Encounter: Payer: Self-pay | Admitting: Nutrition

## 2021-05-19 ENCOUNTER — Encounter: Payer: Medicare HMO | Attending: Internal Medicine | Admitting: Nutrition

## 2021-05-19 VITALS — Wt 157.0 lb

## 2021-05-19 DIAGNOSIS — I10 Essential (primary) hypertension: Secondary | ICD-10-CM | POA: Diagnosis not present

## 2021-05-19 DIAGNOSIS — E782 Mixed hyperlipidemia: Secondary | ICD-10-CM | POA: Diagnosis not present

## 2021-05-19 DIAGNOSIS — G459 Transient cerebral ischemic attack, unspecified: Secondary | ICD-10-CM | POA: Insufficient documentation

## 2021-05-19 DIAGNOSIS — E118 Type 2 diabetes mellitus with unspecified complications: Secondary | ICD-10-CM | POA: Diagnosis not present

## 2021-05-19 DIAGNOSIS — R634 Abnormal weight loss: Secondary | ICD-10-CM | POA: Insufficient documentation

## 2021-05-19 NOTE — Patient Instructions (Signed)
Goals ? ?Eat three meals per day ?Don't skip meals ?Eat more plant based foods of fruits, vegetables and whole grains. ?Keep walking for exercise. ?Keep up the great job. ? ?

## 2021-05-19 NOTE — Progress Notes (Signed)
Medical Nutrition Therapy  ?Appointment Start time:  0800  Appointment End time:  0900 ? ?Primary concerns today: Weight loss, diabetes type 2 ?Referral diagnosis: R63.4, E11.8 ?Preferred learning style: read  ?Learning readiness: Ready  ? ?Assessment Follow up  DM and weight loss ?Appetite is better. Gained 4 lbs. ?FBS 100-110's usually. Still taking Metformin Metformin 500 mg BID. ?Had a stroke in December 2022. ? ? ?Anthropometrics  ?Wt Readings from Last 3 Encounters:  ?04/06/21 153 lb 6.4 oz (69.6 kg)  ?03/21/21 156 lb 12.8 oz (71.1 kg)  ?03/03/21 152 lb 9.6 oz (69.2 kg)  ? ?Ht Readings from Last 3 Encounters:  ?04/06/21 5' (1.524 m)  ?03/21/21 '5\' 1"'$  (1.549 m)  ?03/03/21 '5\' 1"'$  (1.549 m)  ? ?There is no height or weight on file to calculate BMI. ?'@BMIFA'$ @ ?Facility age limit for growth percentiles is 20 years. ?Facility age limit for growth percentiles is 20 years. ?  ? ?Clinical ?Medical Hx: See chart: AFIB, DM Type 2, CVA ?Medications: see chart. Metformin 500 mg BID ?Labs:  ?Lab Results  ?Component Value Date  ? HGBA1C 5.7 (H) 02/18/2021  ? ? ?  Latest Ref Rng & Units 02/18/2021  ?  5:07 AM 02/17/2021  ? 11:27 PM 02/17/2021  ? 11:22 PM  ?CMP  ?Glucose 70 - 99 mg/dL 110   108   114    ?BUN 8 - 23 mg/dL '15   17   18    '$ ?Creatinine 0.44 - 1.00 mg/dL 0.55   0.70   0.67    ?Sodium 135 - 145 mmol/L 139   139   135    ?Potassium 3.5 - 5.1 mmol/L 3.9   3.7   3.7    ?Chloride 98 - 111 mmol/L 106   102   101    ?CO2 22 - 32 mmol/L 25    25    ?Calcium 8.9 - 10.3 mg/dL 9.3    9.3    ?Total Protein 6.5 - 8.1 g/dL 6.5    7.3    ?Total Bilirubin 0.3 - 1.2 mg/dL 0.3    0.5    ?Alkaline Phos 38 - 126 U/L 43    46    ?AST 15 - 41 U/L 30    33    ?ALT 0 - 44 U/L 20    23    ? ?Lipid Panel  ?   ?Component Value Date/Time  ? CHOL 152 02/18/2021 0507  ? TRIG 69 02/18/2021 0507  ? TRIG 127 03/24/2008 0000  ? HDL 52 02/18/2021 0507  ? CHOLHDL 2.9 02/18/2021 0507  ? VLDL 14 02/18/2021 0507  ? [place] 86 02/18/2021 0507  ? [place] 108  03/24/2008 0000  ? ? ?Notable Signs/Symptoms: fatigue, weak, weight loss of 20 lbs over the last year or so. ?Doesn't have as much of an appetite at times. Sometimes skips meals due to not being hungry. ? ?Lifestyle & Dietary Hx  ?Lives with her husband. He usually cooks. She is fatigued often. Eats 2- 3 meals per day.  ?Lost 20 lbs over the last year or two after heart issues. ?DM well controlled. Tests BS once a day. ?Eats mostly at home, but does eat out some. ? ?Estimated daily fluid intake: 40 oz ?Supplements: see chart ?Sleep: good ?Stress / self-care: her health ?Current average weekly physical activity: Had been walking a mile with her husband when she can. ? ? ?24-Hr Dietary Recall ?First Meal:Oatmeal or raisin bran ?Snack:  ?Second Meal:  cheese sandwich,  unsweet tea or water ?Snack: fruit or a cookie. ?Third Meal: Bowl of raisin bran, whole milk, water ?Snack:  ?Beverages: water,  occassional soda, tea ? ?Estimated Energy Needs ?Calories: 1500  ?Carbohydrate: 170 g ?Protein: 112 g ?Fat: 42 g ? ? ?NUTRITION DIAGNOSIS  ?NI-1.6 Predicted suboptional energy  As related to insuffient calorie intake.  As evidenced by 20 lbs weight loss in the last 1-2 years.. ? ? ?NUTRITION INTERVENTION  ?Nutrition education (E-1) on the following topics:  ?Lifestyle Medicine ?- Whole Food, Plant Predominant Nutrition is highly recommended: Eat Plenty of vegetables, Mushrooms, fruits, Legumes, Whole Grains, Nuts, seeds in lieu of processed meats, processed snacks/pastries red meat, poultry, eggs.  ?  ?-It is better to avoid simple carbohydrates including: Cakes, Sweet Desserts, Ice Cream, Soda (diet and regular), Sweet Tea, Candies, Chips, Cookies, Store Bought Juices, Alcohol in Excess of  1-2 drinks a day, Lemonade,  Artificial Sweeteners, Doughnuts, Coffee Creamers, "Sugar-free" Products, etc, etc.  This is not a complete list..... ? ?Exercise: If you are able: 30 -60 minutes a day ,4 days a week, or 150 minutes a week.   The longer the better.  Combine stretch, strength, and aerobic activities.  If you were told in the past that you have high risk for cardiovascular diseases, you may seek evaluation by your heart doctor prior to initiating moderate to intense exercise programs. ? ?Foods high in VIT K to be aware of and discuss with your cardiologist. ? ?Nutrient dense foods, healthy calories and importance of balanced meals ? ? ?Handouts Provided Include  ?Lifestyle nutrition ? ? ? ?Learning Style & Readiness for Change ?Teaching method utilized: Visual & Auditory  ?Demonstrated degree of understanding via: Teach Back  ?Barriers to learning/adherence to lifestyle change: none ? ?Goals Established by Pt ?Goals ? ? ? ?Eat three meals per day ?Don't skip meals ?Eat more plant based foods of fruits, vegetables and whole grains. ?Keep walking for exercise. ?Keep up the great job. ? ?Lifestyle Medicine ?- Whole Food, Plant Predominant Nutrition is highly recommended: Eat Plenty of vegetables, Mushrooms, fruits, Legumes, Whole Grains, Nuts, seeds in lieu of processed meats, processed snacks/pastries red meat, poultry, eggs.  ?  ?-It is better to avoid simple carbohydrates including: Cakes, Sweet Desserts, Ice Cream, Soda (diet and regular), Sweet Tea, Candies, Chips, Cookies, Store Bought Juices, Alcohol in Excess of  1-2 drinks a day, Lemonade,  Artificial Sweeteners, Doughnuts, Coffee Creamers, "Sugar-free" Products, etc, etc.  This is not a complete list..... ? ?Exercise: If you are able: 30 -60 minutes a day ,4 days a week, or 150 minutes a week.  The longer the better.  Combine stretch, strength, and aerobic activities.  If you were told in the past that you have high risk for cardiovascular diseases, you may seek evaluation by your heart doctor prior to initiating moderate to intense exercise programs. ? ? ?MONITORING & EVALUATION ?Dietary intake, weekly physical activity, and weight PRN.  ?May consider reducing Metformin to 500 mg  once a day or d ? ?Next Steps  ?Patient is to work on eating meals more consistently and more nutrient dense foods.. ? ?

## 2021-05-23 ENCOUNTER — Ambulatory Visit (HOSPITAL_COMMUNITY): Payer: Medicare HMO | Attending: Cardiology

## 2021-05-23 ENCOUNTER — Other Ambulatory Visit: Payer: Self-pay | Admitting: Physician Assistant

## 2021-05-23 ENCOUNTER — Encounter: Payer: Self-pay | Admitting: Physician Assistant

## 2021-05-23 DIAGNOSIS — I060 Rheumatic aortic stenosis: Secondary | ICD-10-CM | POA: Diagnosis present

## 2021-05-23 DIAGNOSIS — I050 Rheumatic mitral stenosis: Secondary | ICD-10-CM | POA: Diagnosis present

## 2021-05-23 DIAGNOSIS — I351 Nonrheumatic aortic (valve) insufficiency: Secondary | ICD-10-CM | POA: Diagnosis not present

## 2021-05-23 DIAGNOSIS — I350 Nonrheumatic aortic (valve) stenosis: Secondary | ICD-10-CM

## 2021-05-23 DIAGNOSIS — I340 Nonrheumatic mitral (valve) insufficiency: Secondary | ICD-10-CM | POA: Diagnosis not present

## 2021-05-23 LAB — ECHOCARDIOGRAM COMPLETE
AR max vel: 0.78 cm2
AV Area VTI: 0.74 cm2
AV Area mean vel: 0.75 cm2
AV Mean grad: 33.2 mmHg
AV Peak grad: 56.7 mmHg
Ao pk vel: 3.77 m/s
Area-P 1/2: 3 cm2
MV VTI: 0.93 cm2
P 1/2 time: 504 msec
S' Lateral: 2.2 cm

## 2021-05-25 ENCOUNTER — Institutional Professional Consult (permissible substitution): Payer: Medicare HMO | Admitting: Surgery

## 2021-05-25 ENCOUNTER — Encounter: Payer: Self-pay | Admitting: Surgery

## 2021-05-25 ENCOUNTER — Ambulatory Visit (INDEPENDENT_AMBULATORY_CARE_PROVIDER_SITE_OTHER): Payer: Medicare PPO | Admitting: *Deleted

## 2021-05-25 VITALS — BP 136/78 | HR 77 | Ht 60.0 in | Wt 153.0 lb

## 2021-05-25 DIAGNOSIS — I350 Nonrheumatic aortic (valve) stenosis: Secondary | ICD-10-CM | POA: Diagnosis not present

## 2021-05-25 DIAGNOSIS — I4819 Other persistent atrial fibrillation: Secondary | ICD-10-CM

## 2021-05-25 DIAGNOSIS — Z5181 Encounter for therapeutic drug level monitoring: Secondary | ICD-10-CM | POA: Diagnosis not present

## 2021-05-25 LAB — POCT INR: INR: 3.4 — AB (ref 2.0–3.0)

## 2021-05-25 MED ORDER — ENOXAPARIN SODIUM 100 MG/ML IJ SOSY: 100.0000 mg | PREFILLED_SYRINGE | INTRAMUSCULAR | 1 refills | Status: DC

## 2021-05-25 NOTE — Progress Notes (Signed)
Surgical Instructions ? ? ? Your procedure is scheduled on 05/31/21. ? Report to [facility] Main Entrance "A" at 8:45 A.M., then check in with the Admitting office. ? Call this number if you have problems the morning of surgery: ? [phone] ? ? If you have any questions prior to your surgery date call [phone]: Open Monday-Friday 8am-4pm ? ? ? Remember: ? Do not eat or drink after midnight the night before your surgery ? ? ?  ? Take these medicines the morning of surgery with A SIP OF WATER:  NONE ? ?Please continue taking all of your current medications through the day before surgery, except please HOLD COUMADIN as outlined by the Coumad[facility]. ? ?On the day of surgery, take NO medications.  ? ? ?As of today, STOP taking Aleve, Naproxen, Ibuprofen, Motrin, Advil, Goody's, BC's, all herbal medications, fish oil, and all vitamins. ? ?         ?Do not wear jewelry or makeup ?Do not wear lotions, powders, perfumes or deodorant. ?Do not shave 48 hours prior to surgery.  ?Do not bring valuables to the hospital. ?Do not wear nail polish, gel polish, artificial nails, or any other type of covering on natural nails (fingers and toes) ?If you have artificial nails or gel coating that need to be removed by a nail salon, please have this removed prior to surgery. Artificial nails or gel coating may interfere with anesthesia's ability to adequately monitor your vital signs. ? ?[facility] is not responsible for any belongings or valuables. .  ? ?Do NOT Smoke (Tobacco/Vaping)  24 hours prior to your procedure ? ?If you use a CPAP at night, you may bring your mask for your overnight stay. ?  ?Contacts, glasses, hearing aids, dentures or partials may not be worn into surgery, please bring cases for these belongings ?  ?For patients admitted to the hospital, discharge time will be determined by your treatment team. ?  ?Patients discharged the day of surgery will not be allowed to drive home, and someone needs to stay with  them for 24 hours. ? ? ?SURGICAL WAITING ROOM VISITATION ?Patients having surgery or a procedure in a hospital may have two support people. ?Children under the age of 72 must have an adult with them who is not the patient. ?They may stay in the waiting area during the procedure and may switch out with other visitors. If the patient needs to stay at the hospital during part of their recovery, the visitor guidelines for inpatient rooms apply. ? ?Please refer to the [facility] website for the visitor guidelines for Inpatients (after your surgery is over and you are in a regular room).  ? ? ? ?Special instructions:   ? ?Oral Hygiene is also important to reduce your risk of infection.  Remember - BRUSH YOUR TEETH THE MORNING OF SURGERY WITH YOUR REGULAR TOOTHPASTE ? ? ?[facility]- Preparing For Surgery ? ?Before surgery, you can play an important role. Because skin is not sterile, your skin needs to be as free of germs as possible. You can reduce the number of germs on your skin by washing with CHG (chlorahexidine gluconate) Soap before surgery.  CHG is an antiseptic cleaner which kills germs and bonds with the skin to continue killing germs even after washing.   ? ? ?Please do not use if you have an allergy to CHG or antibacterial soaps. If your skin becomes reddened/irritated stop using the CHG.  ?Do not shave (including legs and underarms)  for at least 48 hours prior to first CHG shower. It is OK to shave your face. ? ?Please follow these instructions carefully. ?  ? ? Shower the NIGHT BEFORE SURGERY and the MORNING OF SURGERY with CHG Soap.  ? If you chose to wash your hair, wash your hair first as usual with your normal shampoo. After you shampoo, rinse your hair and body thoroughly to remove the shampoo.  Then [employer] and genitals (private parts) with your normal soap and rinse thoroughly to remove soap. ? ?After that Use CHG Soap as you would any other liquid soap. You can apply CHG directly to the skin and  wash gently with a scrungie or a clean washcloth.  ? ?Apply the CHG Soap to your body ONLY FROM THE NECK DOWN.  Do not use on open wounds or open sores. Avoid contact with your eyes, ears, mouth and genitals (private parts). Wash Face and genitals (private parts)  with your normal soap.  ? ?Wash thoroughly, paying special attention to the area where your surgery will be performed. ? ?Thoroughly rinse your body with warm water from the neck down. ? ?DO NOT shower/wash with your normal soap after using and rinsing off the CHG Soap. ? ?Pat yourself dry with a CLEAN TOWEL. ? ?Wear CLEAN PAJAMAS to bed the night before surgery ? ?Place CLEAN SHEETS on your bed the night before your surgery ? ?DO NOT SLEEP WITH PETS. ? ? ?Day of Surgery: ? ?Take a shower with CHG soap. ?Wear Clean/Comfortable clothing the morning of surgery ?Do not apply any deodorants/lotions.   ?Remember to brush your teeth WITH YOUR REGULAR TOOTHPASTE. ? ? ? ?If you received a COVID test during your pre-op visit  it is requested that you wear a mask when out in public, stay away from anyone that may not be feeling well and notify your surgeon if you develop symptoms. If you have been in contact with anyone that has tested positive in the last 10 days please notify you surgeon. ? ?  ?Please read over the following fact sheets that you were given.   ?

## 2021-05-25 NOTE — Progress Notes (Signed)
Patient ID: Anne Wolf, female   DOB: [birth year], 88 y.o.   MRN: [id number] ? ? ?[place]   ?[facility] ?  ? ? ? ?   ?[address] ?      [place] [address] ?            [phone]   ?      ? ?CARDIOTHORACIC SURGERY CONSULTATION REPORT ? ?PCP is Asencion Noble, MD ?Referring Provider is Sherren Mocha, MD ?Primary Cardiologist is Carlyle Dolly, MD ? ?Reason for consultation: Severe aortic stenosis ? ?HPI: ? ?The patient is an 88 year old woman with a history of permanent atrial fibrillation on Coumadin, type 2 diabetes, hyperlipidemia, and aortic stenosis who developed acute shortness of breath after a flight to [place] to visit family.  She was taken to the ER at [facility] and treated with intravenous diuresis.  A 2D echocardiogram suggested the possibility of severe paradoxical low-flow/low gradient aortic stenosis.  She has been followed for aortic stenosis with mild aortic stenosis dating back to 2010.  Her previous echocardiogram on 07/07/2020 had shown a trileaflet aortic valve with moderate calcification and thickening.  The mean gradient was 32 mmHg with a peak gradient of 52 mmHg.  Aortic valve area was 0.82 cm? by VTI.  There is mild aortic insufficiency.  There was also moderate thickening of the mitral valve leaflets with mild calcification and mild mitral valve stenosis with a planimeter valve area of 1.56 cm?Marland Kitchen  After her return from [place] she continued to have symptoms of exertional fatigue and shortness of breath with moderate activity.  She had a repeat echocardiogram on 12/27/2020 which showed a mean gradient across aortic valve of 29 mmHg with a peak gradient of 48 mmHg.  Valve area was 0.95 cm?.  There is mild to moderate aortic insufficiency with a pressure half-time of 320 ms.  Dimensionless index was 0.3.  Stroke-volume index was normal at 43.  Left ventricular ejection fraction was 60 to 65%. ? ?She  suffered an acute right basal ganglia stroke on 02/17/2021 in the setting of a subtherapeutic INR at 1.3.  She has left-sided weakness and left facial droop.  This improved quickly and she has had a good recovery with the only lingering effects being her short-term memory. ? ?She is here today with her husband who is 86 years old and in good health.  She reports continued exertional shortness of breath and fatigue with moderate activity.  She has had some shortness of breath at rest recently. She has been more tired than usual.  She denies any dizziness or syncope.  She has had no chest pain or pressure.  She reports some orthopnea.  She denies peripheral edema. ? ?Past Medical History:  ?Diagnosis Date  ? Aortic stenosis   ? mild; mild MR; slightly increased pulmonary artery pressure with mild RVH; normal LV-2010  ? Arthritis   ? Atrial fibrillation ([place])   ? chronic anticoagulation; adequate HR control on minimal AV nodal blocking medication   ? Cancer [facility])   ? skin cancers removed from face  ? CHF (congestive heart failure) ([place])   ? Diabetes mellitus without complication ([place])   ? 2  ? Fasting hyperglycemia   ? + microalbuminuria; A1c of 6.5% in 11/2010  ? Hematochezia 01/2009  ? 01/2009-presumed ischemic colitis; history of diverticulosis  ? History of kidney stones   ? Hx of adenomatous colonic polyps 2005  ? adenomatous polyp; negative colonoscopy in  2009  ? Hyperlipidemia   ? ? ?Past Surgical History:  ?Procedure Laterality Date  ? CATARACT EXTRACTION W/PHACO Right 03/23/2014  ? Procedure: CATARACT EXTRACTION PHACO AND INTRAOCULAR LENS PLACEMENT (IOC);  Surgeon: Williams Che, MD;  Location: [facility];  Service: Ophthalmology;  Laterality: Right;  CDE:2.83  ? CATARACT EXTRACTION W/PHACO Left 07/27/2014  ? Procedure: CATARACT EXTRACTION PHACO AND INTRAOCULAR LENS PLACEMENT LEFT EYE CDE=6.97;  Surgeon: Williams Che, MD;  Location: [facility];  Service: Ophthalmology;  Laterality: Left;  ? COLONOSCOPY  2009  ?  negative; prior study with adenomatous polyp  ? CYSTOSCOPY/URETEROSCOPY/HOLMIUM LASER/STENT PLACEMENT Right 08/21/2016  ? Procedure: CYSTOSCOPY/URETEROSCOPY/RETROGRADE PYELOGRAM//STENT PLACEMENT;  Surgeon: Nickie Retort, MD;  Location: [facility];  Service: Urology;  Laterality: Right;  ? EYE SURGERY    ? RIGHT/LEFT HEART CATH AND CORONARY ANGIOGRAPHY N/A 02/04/2021  ? Procedure: RIGHT/LEFT HEART CATH AND CORONARY ANGIOGRAPHY;  Surgeon: Sherren Mocha, MD;  Location: [place] CV LAB;  Service: Cardiovascular;  Laterality: N/A;  ? ? ?Family History  ?Problem Relation Age of Onset  ? Cancer Father   ? Arrhythmia Sister   ?     Atrial fibrillation  ? ? ?Social History  ? ?Socioeconomic History  ? Marital status: Married  ?  Spouse name: Not on file  ? Number of children:  2  ? Years of education: Not on file  ? Highest education level: Not on file  ?Occupational History  ? Occupation: Retired  ?Tobacco Use  ? Smoking status: Never  ? Smokeless tobacco: Never  ?Vaping Use  ? Vaping Use: Never used  ?Substance and Sexual Activity  ? Alcohol use: No  ? Drug use: No  ? Sexual activity: Not Currently  ?Other Topics Concern  ? Not on file  ?Social History Narrative  ? Married with 2 children  ? No regular exercise  ? ?Social Determinants of Health  ? ?Financial Resource Strain: Not on file  ?Food Insecurity: Not on file  ?Transportation Needs: Not on file  ?Physical Activity: Not on file  ?Stress: Not on file  ?Social Connections: Not on file  ?Intimate Partner Violence: Not on file  ? ? ?Prior to Admission medications   ?Medication Sig Start Date End Date Taking? Authorizing Provider  ?alendronate (FOSAMAX) 70 MG tablet Take 70 mg by mouth once a week.   Yes [provider]  ?Biotin 5000 MCG CAPS Take 5,000 mcg by mouth daily.   Yes [provider]  ?calcium carbonate (OSCAL) 1500 (600 Ca) MG TABS tablet Take 600 mg of elemental calcium by mouth daily.   Yes [provider]  ?cholecalciferol  (VITAMIN D) 1000 units tablet Take 1,000 Units by mouth at bedtime.   Yes [provider]  ?Co-Enzyme Q-10 100 MG CAPS Take 100 mg by mouth at bedtime.   Yes [provider]  ?enoxaparin (LOVENOX) 100 MG/ML injection Inject 1 mL (100 mg total) into the skin daily. Inject at 6pm daily 05/25/21  Yes Branch, Alphonse Guild, MD  ?ferrous sulfate 325 (65 FE) MG tablet Take 325 mg by mouth daily with breakfast.   Yes [provider]  ?lisinopril (PRINIVIL,ZESTRIL) 10 MG tablet Take 10 mg by mouth at bedtime.   Yes [provider]  ?loratadine (CLARITIN) 10 MG tablet Take 10 mg by mouth at bedtime.   Yes [provider]  ?metFORMIN (GLUCOPHAGE) 500 MG tablet Take 500 mg by mouth 2 (two) times daily with a meal.    Yes [provider]  ?metoprolol succinate (TOPROL-XL) 25 MG 24 hr tablet Take 25 mg by mouth at bedtime.   Yes [provider]  ?Multiple Vitamins-Minerals (CENTRUM SILVER) tablet Take 1 tablet by mouth at bedtime.    Yes [provider]  ?simvastatin (ZOCOR) 40 MG tablet Take 40 mg by mouth at bedtime.     Yes [provider]  ?warfarin (COUMADIN) 2 MG tablet Take 1 tablet daily except 1/2 tablet on Wednesdays and Saturdays or as directed ?Patient taking differently: Take 2 mg by mouth daily at 6 PM. 12/08/20  Yes Satira Sark, MD  ? ? ?Current Outpatient Medications  ?Medication Sig Dispense Refill  ? alendronate (FOSAMAX) 70 MG tablet Take 70 mg by mouth once a week.    ? Biotin 5000 MCG CAPS Take 5,000 mcg by mouth daily.    ? calcium carbonate (OSCAL) 1500 (600 Ca) MG TABS tablet Take 600 mg of elemental calcium by mouth daily.    ? cholecalciferol (VITAMIN D) 1000 units tablet Take 1,000 Units by mouth at bedtime.    ? Co-Enzyme Q-10 100 MG CAPS Take 100 mg by mouth at bedtime.    ? enoxaparin (LOVENOX) 100 MG/ML injection Inject 1 mL (100 mg total) into the skin daily. Inject at 6pm daily 10 mL 1  ? ferrous sulfate 325 (65  FE) MG tablet Take 325 mg by mouth daily with breakfast.    ? lisinopril (PRINIVIL,ZESTRIL) 10 MG tablet Take 10 mg by mouth at bedtime.    ? loratadine (CLARITIN) 10 MG tablet Take 10 mg by mouth at bedtime.

## 2021-05-25 NOTE — Patient Instructions (Addendum)
Pending TAVR on 05/31/21 ? ?Labs: 02/18/21  Hct 12.6  Hgb 40.2  Plts 195  SCr 0.55  CrCl 84.65  Wt 71.7kg   (labs being repeated on 4/7) ?Lovenox '100mg'$  sq daily.  Rx sent to [facility] ? ?4/5  Last dose of warfarin ('1mg'$ )   INR was 3.4 ?4/6  No lovenox or warfarin ?4/7 - 4/9  Lovenox '100mg'$  sq at 6pm ?4/10  No Lovenox ?4/11  No lovenox-----procedure--------warfarin '3mg'$  pm ?4/12  Lovenox '100mg'$  8am  and warfarin '3mg'$  pm ?4/13 - 4/16  Lovenox '100mg'$  8am and warfarin '2mg'$  pm ?4/17  Lovenox '100mg'$  sq 8am------------INR appt at 9:00am ? ? ?

## 2021-05-27 ENCOUNTER — Encounter (HOSPITAL_COMMUNITY)
Admission: RE | Admit: 2021-05-27 | Discharge: 2021-05-27 | Disposition: A | Discharge status: Home / Self Care | Payer: Medicare PPO | Source: Ambulatory Visit | Attending: Cardiovascular Disease | Admitting: Cardiovascular Disease

## 2021-05-27 ENCOUNTER — Encounter (HOSPITAL_COMMUNITY): Payer: Self-pay

## 2021-05-27 ENCOUNTER — Other Ambulatory Visit: Payer: Self-pay

## 2021-05-27 ENCOUNTER — Ambulatory Visit (HOSPITAL_COMMUNITY)
Admission: RE | Admit: 2021-05-27 | Discharge: 2021-05-27 | Disposition: A | Discharge status: Home / Self Care | Payer: Medicare PPO | Source: Ambulatory Visit | Attending: Cardiovascular Disease | Admitting: Cardiovascular Disease

## 2021-05-27 ENCOUNTER — Other Ambulatory Visit: Payer: Self-pay | Admitting: Cardiology

## 2021-05-27 VITALS — BP 128/59 | HR 80 | Temp 97.9°F | Resp 17 | Ht 60.0 in | Wt 155.2 lb

## 2021-05-27 DIAGNOSIS — Z20822 Contact with and (suspected) exposure to covid-19: Secondary | ICD-10-CM | POA: Diagnosis not present

## 2021-05-27 DIAGNOSIS — I350 Nonrheumatic aortic (valve) stenosis: Secondary | ICD-10-CM

## 2021-05-27 DIAGNOSIS — Z01818 Encounter for other preprocedural examination: Secondary | ICD-10-CM | POA: Diagnosis not present

## 2021-05-27 HISTORY — DX: Cerebral infarction, unspecified: I63.9

## 2021-05-27 HISTORY — DX: Dyspnea, unspecified: R06.00

## 2021-05-27 LAB — URINALYSIS, ROUTINE W REFLEX MICROSCOPIC
Bilirubin Urine: NEGATIVE
Glucose, UA: NEGATIVE mg/dL
Ketones, ur: NEGATIVE mg/dL
Nitrite: POSITIVE — AB
Protein, ur: 30 mg/dL — AB
Specific Gravity, Urine: 1.02 (ref 1.005–1.030)
WBC, UA: >50 WBC/hpf — ABNORMAL HIGH (ref 0–5)
pH: 5 (ref 5.0–8.0)

## 2021-05-27 LAB — BLOOD GAS, ARTERIAL
Acid-Base Excess: 1.1 mmol/L (ref 0.0–2.0)
Bicarbonate: 25.1 mmol/L (ref 20.0–28.0)
Drawn by: 602861
O2 Saturation: 99.1 %
Patient temperature: 37
pCO2 arterial: 37 mmHg (ref 32–48)
pH, Arterial: 7.44 (ref 7.35–7.45)
pO2, Arterial: 100 mmHg (ref 83–108)

## 2021-05-27 LAB — CBC
HCT: 41.9 % (ref 36.0–46.0)
Hemoglobin: 13.2 g/dL (ref 12.0–15.0)
MCH: 29.3 pg (ref 26.0–34.0)
MCHC: 31.5 g/dL (ref 30.0–36.0)
MCV: 92.9 fL (ref 80.0–100.0)
Platelets: 207 10*3/uL (ref 150–400)
RBC: 4.51 MIL/uL (ref 3.87–5.11)
RDW: 14.5 % (ref 11.5–15.5)
WBC: 8.4 10*3/uL (ref 4.0–10.5)
nRBC: 0 % (ref 0.0–0.2)

## 2021-05-27 LAB — COMPREHENSIVE METABOLIC PANEL
ALT: 20 U/L (ref 0–44)
AST: 31 U/L (ref 15–41)
Albumin: 3.7 g/dL (ref 3.5–5.0)
Alkaline Phosphatase: 31 U/L — ABNORMAL LOW (ref 38–126)
Anion gap: 8 (ref 5–15)
BUN: 15 mg/dL (ref 8–23)
CO2: 24 mmol/L (ref 22–32)
Calcium: 9.3 mg/dL (ref 8.9–10.3)
Chloride: 108 mmol/L (ref 98–111)
Creatinine, Ser: 0.79 mg/dL (ref 0.44–1.00)
GFR, Estimated: >60 mL/min (ref >60)
Glucose, Bld: 97 mg/dL (ref 70–99)
Potassium: 3.8 mmol/L (ref 3.5–5.1)
Sodium: 140 mmol/L (ref 135–145)
Total Bilirubin: 0.7 mg/dL (ref 0.3–1.2)
Total Protein: 6.6 g/dL (ref 6.5–8.1)

## 2021-05-27 LAB — GLUCOSE, CAPILLARY: Glucose-Capillary: 124 mg/dL — ABNORMAL HIGH (ref 70–99)

## 2021-05-27 LAB — TYPE AND SCREEN
ABO/RH(D): A POS
Antibody Screen: NEGATIVE

## 2021-05-27 LAB — SARS CORONAVIRUS 2 (TAT 6-24 HRS): SARS Coronavirus 2: NEGATIVE

## 2021-05-27 LAB — SURGICAL PCR SCREEN
MRSA, PCR: NEGATIVE
Staphylococcus aureus: NEGATIVE

## 2021-05-27 MED ORDER — NITROFURANTOIN MONOHYD MACRO 100 MG PO CAPS: 100.0000 mg | ORAL_CAPSULE | Freq: Every day | ORAL | 0 refills | Status: AC

## 2021-05-27 NOTE — Progress Notes (Signed)
PAT urine sample with evidence of UTI. Will treat with Macrobid '100mg'$  PO QD x 5 days. This was communicated to the patient and sent to her preferred pharmacy.  ? ?Kathyrn Drown NP-C ?Structural Heart Team  ?Pager: [phone] ?Phone: [phone] ? ?

## 2021-05-27 NOTE — Progress Notes (Signed)
Send [employer] high priority to Dr. Burt Knack regarding abnormal UA. ?

## 2021-05-27 NOTE — Progress Notes (Signed)
PCP:  Asencion Noble, MD ?Cardiologist:  Carlyle Dolly, MD ? ?EKG:  05/27/21 ?CXR: 05/27/21 ?ECHO:  05/23/21 ?Stress Test:  08/17/08 ?Cardiac Cath: 02/02/21 ? ?Fasting Blood Sugar-100 ?Checks Blood Sugar__ 1_ times a day ? ?ASA: No ?Coumadin:  Orders said to follow clinic instructions.  She took her dose today, but thinks she is suppose to start Lovenox tonight.  She will read instructions when she gets home.  I instructed her to surgeon's office with any clarification needed.  ? ?OSA/CPAP: No ? ?Covid test 05/27/21 at PAT ? ?Anesthesia Review: Yes, cardiac history ? ?Patient denies shortness of breath, fever, cough, and chest pain at PAT appointment. ? ?Patient verbalized understanding of instructions provided today at the PAT appointment.  Patient asked to review instructions at home and day of surgery.   ?

## 2021-05-30 MED ORDER — HEPARIN 30,000 UNITS/1000 ML (OHS) CELLSAVER SOLUTION
Status: DC
Start: 2021-05-31 — End: 2021-05-31
  Filled 2021-05-30: qty 1000

## 2021-05-30 MED ORDER — MAGNESIUM SULFATE 50 % IJ SOLN
40.0000 meq | INTRAMUSCULAR | Status: DC
  Filled 2021-05-30: qty 9.85

## 2021-05-30 MED ORDER — CEFAZOLIN SODIUM-DEXTROSE 2-4 GM/100ML-% IV SOLN
2.0000 g | INTRAVENOUS | Status: AC
  Administered 2021-05-31: 2 g via INTRAVENOUS
  Filled 2021-05-30 (×2): qty 100

## 2021-05-30 MED ORDER — NOREPINEPHRINE 4 MG/250ML-% IV SOLN
0.0000 ug/min | INTRAVENOUS | Status: AC
  Administered 2021-05-31: 2 ug/min via INTRAVENOUS
  Filled 2021-05-30: qty 250

## 2021-05-30 MED ORDER — POTASSIUM CHLORIDE 2 MEQ/ML IV SOLN
80.0000 meq | INTRAVENOUS | Status: DC
  Filled 2021-05-30: qty 40

## 2021-05-30 MED ORDER — DEXMEDETOMIDINE HCL IN NACL 400 MCG/100ML IV SOLN
0.1000 ug/kg/h | INTRAVENOUS | Status: AC
  Administered 2021-05-31: 70.4 ug via INTRAVENOUS
  Administered 2021-05-31: 1 ug/kg/h via INTRAVENOUS
  Filled 2021-05-30: qty 100

## 2021-05-30 NOTE — Progress Notes (Signed)
Pre Surgical Assessment: 5 M Walk Test ? ?40M=16.109f ? ?5 Meter Walk Test- trial 1: 5.2 seconds ?5 Meter Walk Test- trial 2: 5.5 seconds ?5 Meter Walk Test- trial 3: 6.2 seconds ?5 Meter Walk Test Average: 5.63 seconds ? ? ?

## 2021-05-30 NOTE — H&P (Signed)
? ?   ?[address] ?      [place] [address] ?            [phone]   ? ?  ?Cardiothoracic Surgery Admission History and Physical ? ? ?PCP is Asencion Noble, MD ?Referring Provider is Sherren Mocha, MD ?Primary Cardiologist is Carlyle Dolly, MD ?  ?Reason for admission: Severe aortic stenosis ?  ?HPI: ?  ?The patient is an 88 year old woman with a history of permanent atrial fibrillation on Coumadin, type 2 diabetes, hyperlipidemia, and aortic stenosis who developed acute shortness of breath after a flight to [place] to visit family.  She was taken to the ER at [facility] and treated with intravenous diuresis.  A 2D echocardiogram suggested the possibility of severe paradoxical low-flow/low gradient aortic stenosis.  She has been followed for aortic stenosis with mild aortic stenosis dating back to 2010.  Her previous echocardiogram on 07/07/2020 had shown a trileaflet aortic valve with moderate calcification and thickening.  The mean gradient was 32 mmHg with a peak gradient of 52 mmHg.  Aortic valve area was 0.82 cm? by VTI.  There is mild aortic insufficiency.  There was also moderate thickening of the mitral valve leaflets with mild calcification and mild mitral valve stenosis with a planimeter valve area of 1.56 cm?Marland Kitchen  After her return from [place] she continued to have symptoms of exertional fatigue and shortness of breath with moderate activity.  She had a repeat echocardiogram on 12/27/2020 which showed a mean gradient across aortic valve of 29 mmHg with a peak gradient of 48 mmHg.  Valve area was 0.95 cm?.  There is mild to moderate aortic insufficiency with a pressure half-time of 320 ms.  Dimensionless index was 0.3.  Stroke-volume index was normal at 43.  Left ventricular ejection fraction was 60 to 65%. ?  ?She suffered an acute right basal ganglia stroke on 02/17/2021 in the setting of a subtherapeutic INR at 1.3.  She has left-sided weakness and left facial droop.   This improved quickly and she has had a good recovery with the only lingering effects being her short-term memory. ?  ?She lives with her husband who is 68 years old and in good health.  She reports continued exertional shortness of breath and fatigue with moderate activity.  She has had some shortness of breath at rest recently. She has been more tired than usual.  She denies any dizziness or syncope.  She has had no chest pain or pressure.  She reports some orthopnea.  She denies peripheral edema. ?  ?    ?Past Medical History:  ?Diagnosis Date  ? Aortic stenosis    ?  mild; mild MR; slightly increased pulmonary artery pressure with mild RVH; normal LV-2010  ? Arthritis    ? Atrial fibrillation ([place])    ?  chronic anticoagulation; adequate HR control on minimal AV nodal blocking medication   ? Cancer [facility])    ?  skin cancers removed from face  ? CHF (congestive heart failure) ([place])    ? Diabetes mellitus without complication ([place])    ?  2  ? Fasting hyperglycemia    ?  + microalbuminuria; A1c of 6.5% in 11/2010  ? Hematochezia 01/2009  ?  01/2009-presumed ischemic colitis; history of diverticulosis  ? History of kidney stones    ? Hx of adenomatous colonic polyps 2005  ?  adenomatous polyp; negative colonoscopy in 2009  ? Hyperlipidemia    ?  ?  ?     ?  Past Surgical History:  ?Procedure Laterality Date  ? CATARACT EXTRACTION W/PHACO Right 03/23/2014  ?  Procedure: CATARACT EXTRACTION PHACO AND INTRAOCULAR LENS PLACEMENT (IOC);  Surgeon: Williams Che, MD;  Location: [facility];  Service: Ophthalmology;  Laterality: Right;  CDE:2.83  ? CATARACT EXTRACTION W/PHACO Left 07/27/2014  ?  Procedure: CATARACT EXTRACTION PHACO AND INTRAOCULAR LENS PLACEMENT LEFT EYE CDE=6.97;  Surgeon: Williams Che, MD;  Location: [facility];  Service: Ophthalmology;  Laterality: Left;  ? COLONOSCOPY   2009  ?  negative; prior study with adenomatous polyp  ? CYSTOSCOPY/URETEROSCOPY/HOLMIUM LASER/STENT PLACEMENT Right 08/21/2016  ?  Procedure:  CYSTOSCOPY/URETEROSCOPY/RETROGRADE PYELOGRAM//STENT PLACEMENT;  Surgeon: Nickie Retort, MD;  Location: [facility];  Service: Urology;  Laterality: Right;  ? EYE SURGERY      ? RIGHT/LEFT HEART CATH AND CORONARY ANGIOGRAPHY N/A 02/04/2021  ?  Procedure: RIGHT/LEFT HEART CATH AND CORONARY ANGIOGRAPHY;  Surgeon: Sherren Mocha, MD;  Location: [place] CV LAB;  Service: Cardiovascular;  Laterality: N/A;  ?  ?  ?     ?Family History  ?Problem Relation Age of Onset  ? Cancer Father    ? Arrhythmia Sister    ?      Atrial fibrillation  ?  ?  ?Social History  ?  ?     ?Socioeconomic History  ? Marital status: Married  ?    Spouse name: Not on file  ? Number of children:  2  ? Years of education: Not on file  ? Highest education level: Not on file  ?Occupational History  ? Occupation: Retired  ?Tobacco Use  ? Smoking status: Never  ? Smokeless tobacco: Never  ?Vaping Use  ? Vaping Use: Never used  ?Substance and Sexual Activity  ? Alcohol use: No  ? Drug use: No  ? Sexual activity: Not Currently  ?Other Topics Concern  ? Not on file  ?Social History Narrative  ?  Married with 2 children  ?  No regular exercise  ?  ?Social Determinants of Health  ?  ?Financial Resource Strain: Not on file  ?Food Insecurity: Not on file  ?Transportation Needs: Not on file  ?Physical Activity: Not on file  ?Stress: Not on file  ?Social Connections: Not on file  ?Intimate Partner Violence: Not on file  ?  ?  ?       ?Prior to Admission medications   ?Medication Sig Start Date End Date Taking? Authorizing Provider  ?alendronate (FOSAMAX) 70 MG tablet Take 70 mg by mouth once a week.     Yes [provider]  ?Biotin 5000 MCG CAPS Take 5,000 mcg by mouth daily.     Yes [provider]  ?calcium carbonate (OSCAL) 1500 (600 Ca) MG TABS tablet Take 600 mg of elemental calcium by mouth daily.     Yes [provider]  ?cholecalciferol (VITAMIN D) 1000 units tablet Take 1,000 Units by mouth at bedtime.     Yes [provider]  ?Co-Enzyme Q-10 100 MG CAPS Take 100 mg by mouth at bedtime.     Yes [provider]  ?enoxaparin (LOVENOX) 100 MG/ML injection Inject 1 mL (100 mg total) into the skin daily. Inject at 6pm daily 05/25/21   Yes Branch, Alphonse Guild, MD  ?ferrous sulfate 325 (65 FE) MG tablet Take 325 mg by mouth daily with breakfast.     Yes [provider]  ?lisinopril (PRINIVIL,ZESTRIL) 10 MG tablet Take 10 mg by mouth at bedtime.  Yes [provider]  ?loratadine (CLARITIN) 10 MG tablet Take 10 mg by mouth at bedtime.     Yes [provider]  ?metFORMIN (GLUCOPHAGE) 500 MG tablet Take 500 mg by mouth 2 (two) times daily with a meal.      Yes [provider]  ?metoprolol succinate (TOPROL-XL) 25 MG 24 hr tablet Take 25 mg by mouth at bedtime.     Yes [provider]  ?Multiple Vitamins-Minerals (CENTRUM SILVER) tablet Take 1 tablet by mouth at bedtime.      Yes [provider]  ?simvastatin (ZOCOR) 40 MG tablet Take 40 mg by mouth at bedtime.       Yes [provider]  ?warfarin (COUMADIN) 2 MG tablet Take 1 tablet daily except 1/2 tablet on Wednesdays and Saturdays or as directed ?Patient taking differently: Take 2 mg by mouth daily at 6 PM. 12/08/20   Yes Satira Sark, MD  ?  ?  ?      ?Current Outpatient Medications  ?Medication Sig Dispense Refill  ? alendronate (FOSAMAX) 70 MG tablet Take 70 mg by mouth once a week.      ? Biotin 5000 MCG CAPS Take 5,000 mcg by mouth daily.      ? calcium carbonate (OSCAL) 1500 (600 Ca) MG TABS tablet Take 600 mg of elemental calcium by mouth daily.      ? cholecalciferol (VITAMIN D) 1000 units tablet Take 1,000 Units by mouth at bedtime.      ? Co-Enzyme Q-10 100 MG CAPS Take 100 mg by mouth at bedtime.      ? enoxaparin (LOVENOX) 100 MG/ML injection Inject 1 mL (100 mg total) into the skin daily. Inject at 6pm daily 10 mL 1  ? ferrous sulfate 325 (65 FE) MG tablet Take 325 mg by mouth daily with  breakfast.      ? lisinopril (PRINIVIL,ZESTRIL) 10 MG tablet Take 10 mg by mouth at bedtime.      ? loratadine (CLARITIN) 10 MG tablet Take 10 mg by mouth at bedtime.      ? metFORMIN (GLUCOPHAGE) 500 MG table

## 2021-05-31 ENCOUNTER — Inpatient Hospital Stay (HOSPITAL_COMMUNITY)
Admission: RE | Admit: 2021-05-31 | Discharge: 2021-06-01 | DRG: 267 | Disposition: A | Discharge status: Home / Self Care | Payer: Medicare PPO | Attending: Cardiovascular Disease | Admitting: Cardiovascular Disease

## 2021-05-31 ENCOUNTER — Other Ambulatory Visit: Payer: Self-pay

## 2021-05-31 ENCOUNTER — Encounter (HOSPITAL_COMMUNITY): Payer: Self-pay | Admitting: Cardiovascular Disease

## 2021-05-31 ENCOUNTER — Inpatient Hospital Stay (HOSPITAL_COMMUNITY): Payer: Medicare PPO | Admitting: Certified Registered Nurse Anesthetist

## 2021-05-31 ENCOUNTER — Inpatient Hospital Stay (HOSPITAL_COMMUNITY)
Admission: RE | Admit: 2021-05-31 | Discharge: 2021-05-31 | Disposition: A | Discharge status: Home / Self Care | Payer: Medicare PPO | Source: Ambulatory Visit | Attending: Physician Assistant | Admitting: Physician Assistant

## 2021-05-31 ENCOUNTER — Inpatient Hospital Stay (HOSPITAL_COMMUNITY): Payer: Medicare PPO | Admitting: Vascular Surgery

## 2021-05-31 ENCOUNTER — Encounter (HOSPITAL_COMMUNITY)
Admission: RE | Disposition: A | Discharge status: Home / Self Care | Payer: Self-pay | Source: Home / Self Care | Attending: Cardiovascular Disease

## 2021-05-31 DIAGNOSIS — I5032 Chronic diastolic (congestive) heart failure: Secondary | ICD-10-CM | POA: Diagnosis present

## 2021-05-31 DIAGNOSIS — Z9842 Cataract extraction status, left eye: Secondary | ICD-10-CM

## 2021-05-31 DIAGNOSIS — Z961 Presence of intraocular lens: Secondary | ICD-10-CM | POA: Diagnosis present

## 2021-05-31 DIAGNOSIS — E782 Mixed hyperlipidemia: Secondary | ICD-10-CM | POA: Diagnosis present

## 2021-05-31 DIAGNOSIS — K7460 Unspecified cirrhosis of liver: Secondary | ICD-10-CM | POA: Diagnosis present

## 2021-05-31 DIAGNOSIS — Z9841 Cataract extraction status, right eye: Secondary | ICD-10-CM

## 2021-05-31 DIAGNOSIS — Z006 Encounter for examination for normal comparison and control in clinical research program: Secondary | ICD-10-CM

## 2021-05-31 DIAGNOSIS — I482 Chronic atrial fibrillation, unspecified: Secondary | ICD-10-CM | POA: Diagnosis present

## 2021-05-31 DIAGNOSIS — N390 Urinary tract infection, site not specified: Secondary | ICD-10-CM

## 2021-05-31 DIAGNOSIS — Z7901 Long term (current) use of anticoagulants: Secondary | ICD-10-CM

## 2021-05-31 DIAGNOSIS — I513 Intracardiac thrombosis, not elsewhere classified: Secondary | ICD-10-CM | POA: Diagnosis present

## 2021-05-31 DIAGNOSIS — I4821 Permanent atrial fibrillation: Secondary | ICD-10-CM | POA: Diagnosis present

## 2021-05-31 DIAGNOSIS — I69311 Memory deficit following cerebral infarction: Secondary | ICD-10-CM | POA: Diagnosis not present

## 2021-05-31 DIAGNOSIS — Z86010 Personal history of colonic polyps: Secondary | ICD-10-CM

## 2021-05-31 DIAGNOSIS — Z79899 Other long term (current) drug therapy: Secondary | ICD-10-CM | POA: Diagnosis not present

## 2021-05-31 DIAGNOSIS — Z7983 Long term (current) use of bisphosphonates: Secondary | ICD-10-CM

## 2021-05-31 DIAGNOSIS — Z85828 Personal history of other malignant neoplasm of skin: Secondary | ICD-10-CM

## 2021-05-31 DIAGNOSIS — Z7984 Long term (current) use of oral hypoglycemic drugs: Secondary | ICD-10-CM | POA: Diagnosis not present

## 2021-05-31 DIAGNOSIS — Z87442 Personal history of urinary calculi: Secondary | ICD-10-CM

## 2021-05-31 DIAGNOSIS — I10 Essential (primary) hypertension: Secondary | ICD-10-CM

## 2021-05-31 DIAGNOSIS — E119 Type 2 diabetes mellitus without complications: Secondary | ICD-10-CM | POA: Diagnosis not present

## 2021-05-31 DIAGNOSIS — I350 Nonrheumatic aortic (valve) stenosis: Secondary | ICD-10-CM

## 2021-05-31 DIAGNOSIS — I4891 Unspecified atrial fibrillation: Secondary | ICD-10-CM

## 2021-05-31 DIAGNOSIS — Z952 Presence of prosthetic heart valve: Secondary | ICD-10-CM

## 2021-05-31 DIAGNOSIS — I130 Hypertensive heart and chronic kidney disease with heart failure and stage 1 through stage 4 chronic kidney disease, or unspecified chronic kidney disease: Secondary | ICD-10-CM | POA: Diagnosis present

## 2021-05-31 HISTORY — PX: TRANSCATHETER AORTIC VALVE REPLACEMENT, TRANSFEMORAL: SHX6400

## 2021-05-31 HISTORY — PX: INTRAOPERATIVE TRANSTHORACIC ECHOCARDIOGRAM: SHX6523

## 2021-05-31 LAB — ECHOCARDIOGRAM LIMITED
AR max vel: 2.27 cm2
AV Area VTI: 2.41 cm2
AV Area mean vel: 1.41 cm2
AV Mean grad: 17.7 mmHg
AV Peak grad: 24.6 mmHg
Ao pk vel: 2.48 m/s
Calc EF: 68.5 %
Single Plane A2C EF: 69.6 %
Single Plane A4C EF: 65.6 %

## 2021-05-31 LAB — GLUCOSE, CAPILLARY
Glucose-Capillary: 105 mg/dL — ABNORMAL HIGH (ref 70–99)
Glucose-Capillary: 101 mg/dL — ABNORMAL HIGH (ref 70–99)
Glucose-Capillary: 120 mg/dL — ABNORMAL HIGH (ref 70–99)

## 2021-05-31 LAB — PROTIME-INR
INR: 1.1 (ref 0.8–1.2)
Prothrombin Time: 14.2 seconds (ref 11.4–15.2)

## 2021-05-31 LAB — POCT I-STAT, CHEM 8
BUN: 9 mg/dL (ref 8–23)
BUN: 9 mg/dL (ref 8–23)
Calcium, Ion: 1.13 mmol/L — ABNORMAL LOW (ref 1.15–1.40)
Calcium, Ion: 1.25 mmol/L (ref 1.15–1.40)
Chloride: 106 mmol/L (ref 98–111)
Chloride: 105 mmol/L (ref 98–111)
Creatinine, Ser: 0.6 mg/dL (ref 0.44–1.00)
Creatinine, Ser: 0.6 mg/dL (ref 0.44–1.00)
Glucose, Bld: 115 mg/dL — ABNORMAL HIGH (ref 70–99)
Glucose, Bld: 139 mg/dL — ABNORMAL HIGH (ref 70–99)
HCT: 35 % — ABNORMAL LOW (ref 36.0–46.0)
HCT: 35 % — ABNORMAL LOW (ref 36.0–46.0)
Hemoglobin: 11.9 g/dL — ABNORMAL LOW (ref 12.0–15.0)
Hemoglobin: 11.9 g/dL — ABNORMAL LOW (ref 12.0–15.0)
Potassium: 4 mmol/L (ref 3.5–5.1)
Potassium: 3.9 mmol/L (ref 3.5–5.1)
Sodium: 142 mmol/L (ref 135–145)
Sodium: 143 mmol/L (ref 135–145)
TCO2: 26 mmol/L (ref 22–32)
TCO2: 27 mmol/L (ref 22–32)

## 2021-05-31 LAB — ABO/RH: ABO/RH(D): A POS

## 2021-05-31 SURGERY — IMPLANTATION, AORTIC VALVE, TRANSCATHETER, FEMORAL APPROACH
Anesthesia: Monitor Anesthesia Care | Site: Chest

## 2021-05-31 MED ORDER — INSULIN ASPART 100 UNIT/ML IJ SOLN: 0.0000 [IU] | INTRAMUSCULAR | Status: DC

## 2021-05-31 MED ORDER — IODIXANOL 320 MG/ML IV SOLN
INTRAVENOUS | Status: DC | PRN
  Administered 2021-05-31: 40 mL via INTRA_ARTERIAL

## 2021-05-31 MED ORDER — SODIUM CHLORIDE (PF) 0.9 % IJ SOLN
INTRAMUSCULAR | Status: AC
  Filled 2021-05-31: qty 20

## 2021-05-31 MED ORDER — HEPARIN 6000 UNIT IRRIGATION SOLUTION
Status: AC
  Filled 2021-05-31: qty 1500

## 2021-05-31 MED ORDER — CHLORHEXIDINE GLUCONATE 4 % EX LIQD: 30.0000 mL | CUTANEOUS | Status: DC

## 2021-05-31 MED ORDER — ONDANSETRON HCL 4 MG/2ML IJ SOLN: 4.0000 mg | Freq: Four times a day (QID) | INTRAMUSCULAR | Status: DC | PRN

## 2021-05-31 MED ORDER — LIDOCAINE HCL 1 % IJ SOLN
INTRAMUSCULAR | Status: DC | PRN
  Administered 2021-05-31: 8 mL

## 2021-05-31 MED ORDER — INSULIN ASPART 100 UNIT/ML IJ SOLN: 0.0000 [IU] | INTRAMUSCULAR | Status: DC | PRN

## 2021-05-31 MED ORDER — PHENYLEPHRINE 40 MCG/ML (10ML) SYRINGE FOR IV PUSH (FOR BLOOD PRESSURE SUPPORT)
PREFILLED_SYRINGE | INTRAVENOUS | Status: DC | PRN
Start: 2021-05-31 — End: 2021-05-31
  Administered 2021-05-31: 20 ug via INTRAVENOUS
  Administered 2021-05-31: 40 ug via INTRAVENOUS

## 2021-05-31 MED ORDER — HEPARIN SODIUM (PORCINE) 1000 UNIT/ML IJ SOLN
INTRAMUSCULAR | Status: DC | PRN
  Administered 2021-05-31: 10000 [IU] via INTRAVENOUS

## 2021-05-31 MED ORDER — LACTATED RINGERS IV SOLN: INTRAVENOUS | Status: DC | PRN

## 2021-05-31 MED ORDER — OXYCODONE HCL 5 MG PO TABS: 5.0000 mg | ORAL_TABLET | ORAL | Status: DC | PRN

## 2021-05-31 MED ORDER — CHLORHEXIDINE GLUCONATE 4 % EX LIQD: 60.0000 mL | Freq: Once | CUTANEOUS | Status: DC

## 2021-05-31 MED ORDER — WARFARIN SODIUM 2 MG PO TABS
2.0000 mg | ORAL_TABLET | Freq: Once | ORAL | Status: AC
  Administered 2021-05-31: 2 mg via ORAL
  Filled 2021-05-31: qty 1

## 2021-05-31 MED ORDER — PROTAMINE SULFATE 10 MG/ML IV SOLN
INTRAVENOUS | Status: AC
  Filled 2021-05-31: qty 10

## 2021-05-31 MED ORDER — ACETAMINOPHEN 500 MG PO TABS
1000.0000 mg | ORAL_TABLET | Freq: Once | ORAL | Status: AC
  Administered 2021-05-31: 1000 mg via ORAL
  Filled 2021-05-31: qty 2

## 2021-05-31 MED ORDER — ONDANSETRON HCL 4 MG/2ML IJ SOLN
INTRAMUSCULAR | Status: AC
  Filled 2021-05-31: qty 2

## 2021-05-31 MED ORDER — CEFAZOLIN SODIUM-DEXTROSE 2-4 GM/100ML-% IV SOLN
2.0000 g | Freq: Three times a day (TID) | INTRAVENOUS | Status: AC
  Administered 2021-05-31 – 2021-06-01 (×2): 2 g via INTRAVENOUS
  Filled 2021-05-31 (×2): qty 100

## 2021-05-31 MED ORDER — SODIUM CHLORIDE 0.9 % IV SOLN: INTRAVENOUS | Status: AC

## 2021-05-31 MED ORDER — CHLORHEXIDINE GLUCONATE 0.12 % MT SOLN
15.0000 mL | Freq: Once | OROMUCOSAL | Status: AC
  Administered 2021-05-31: 15 mL via OROMUCOSAL
  Filled 2021-05-31: qty 15

## 2021-05-31 MED ORDER — ACETAMINOPHEN 650 MG RE SUPP: 650.0000 mg | Freq: Four times a day (QID) | RECTAL | Status: DC | PRN

## 2021-05-31 MED ORDER — INSULIN ASPART 100 UNIT/ML IJ SOLN
0.0000 [IU] | Freq: Three times a day (TID) | INTRAMUSCULAR | Status: DC
  Administered 2021-06-01 (×2): 2 [IU] via SUBCUTANEOUS

## 2021-05-31 MED ORDER — PROPOFOL 500 MG/50ML IV EMUL
INTRAVENOUS | Status: DC | PRN
  Administered 2021-05-31: 10 ug/kg/min via INTRAVENOUS

## 2021-05-31 MED ORDER — TRAMADOL HCL 50 MG PO TABS: 50.0000 mg | ORAL_TABLET | ORAL | Status: DC | PRN

## 2021-05-31 MED ORDER — SODIUM CHLORIDE 0.9 % IV SOLN: INTRAVENOUS | Status: DC

## 2021-05-31 MED ORDER — SIMVASTATIN 20 MG PO TABS
40.0000 mg | ORAL_TABLET | Freq: Every day | ORAL | Status: DC
  Administered 2021-05-31: 40 mg via ORAL
  Filled 2021-05-31: qty 2

## 2021-05-31 MED ORDER — PROTAMINE SULFATE 10 MG/ML IV SOLN
INTRAVENOUS | Status: DC | PRN
  Administered 2021-05-31: 90 mg via INTRAVENOUS
  Administered 2021-05-31: 10 mg via INTRAVENOUS

## 2021-05-31 MED ORDER — WARFARIN - PHARMACIST DOSING INPATIENT: Freq: Every day | Status: DC

## 2021-05-31 MED ORDER — LIDOCAINE 2% (20 MG/ML) 5 ML SYRINGE
INTRAMUSCULAR | Status: DC | PRN
  Administered 2021-05-31: 60 mg via INTRAVENOUS

## 2021-05-31 MED ORDER — NITROGLYCERIN IN D5W 200-5 MCG/ML-% IV SOLN: 0.0000 ug/min | INTRAVENOUS | Status: DC

## 2021-05-31 MED ORDER — SODIUM CHLORIDE 0.9 % IV SOLN: 250.0000 mL | INTRAVENOUS | Status: DC | PRN

## 2021-05-31 MED ORDER — FENTANYL CITRATE (PF) 250 MCG/5ML IJ SOLN
INTRAMUSCULAR | Status: AC
  Filled 2021-05-31: qty 5

## 2021-05-31 MED ORDER — PROTAMINE SULFATE 10 MG/ML IV SOLN
INTRAVENOUS | Status: AC
  Filled 2021-05-31: qty 25

## 2021-05-31 MED ORDER — SODIUM CHLORIDE 0.9 % IV SOLN: 250.0000 mL | INTRAVENOUS | Status: DC

## 2021-05-31 MED ORDER — ONDANSETRON HCL 4 MG/2ML IJ SOLN
INTRAMUSCULAR | Status: DC | PRN
Start: 2021-05-31 — End: 2021-05-31
  Administered 2021-05-31: 4 mg via INTRAVENOUS

## 2021-05-31 MED ORDER — NITROFURANTOIN MONOHYD MACRO 100 MG PO CAPS
100.0000 mg | ORAL_CAPSULE | Freq: Every day | ORAL | Status: AC
  Administered 2021-06-01: 100 mg via ORAL
  Filled 2021-05-31: qty 1

## 2021-05-31 MED ORDER — SODIUM CHLORIDE 0.9% FLUSH
3.0000 mL | Freq: Two times a day (BID) | INTRAVENOUS | Status: DC
  Administered 2021-06-01: 3 mL via INTRAVENOUS

## 2021-05-31 MED ORDER — LIDOCAINE 2% (20 MG/ML) 5 ML SYRINGE
INTRAMUSCULAR | Status: AC
  Filled 2021-05-31: qty 10

## 2021-05-31 MED ORDER — SODIUM CHLORIDE 0.9% FLUSH
3.0000 mL | INTRAVENOUS | Status: DC | PRN
Start: 2021-05-31 — End: 2021-06-01

## 2021-05-31 MED ORDER — PROPOFOL 10 MG/ML IV BOLUS
INTRAVENOUS | Status: DC | PRN
Start: 2021-05-31 — End: 2021-05-31
  Administered 2021-05-31: 30 mg via INTRAVENOUS

## 2021-05-31 MED ORDER — 0.9 % SODIUM CHLORIDE (POUR BTL) OPTIME
TOPICAL | Status: DC | PRN
  Administered 2021-05-31: 1000 mL

## 2021-05-31 MED ORDER — HEPARIN 6000 UNIT IRRIGATION SOLUTION
Status: DC | PRN
Start: 2021-05-31 — End: 2021-05-31
  Administered 2021-05-31 (×3): 1

## 2021-05-31 MED ORDER — PHENYLEPHRINE 40 MCG/ML (10ML) SYRINGE FOR IV PUSH (FOR BLOOD PRESSURE SUPPORT)
PREFILLED_SYRINGE | INTRAVENOUS | Status: AC
  Filled 2021-05-31: qty 10

## 2021-05-31 MED ORDER — LIDOCAINE HCL (PF) 1 % IJ SOLN
INTRAMUSCULAR | Status: AC
  Filled 2021-05-31: qty 30

## 2021-05-31 MED ORDER — ACETAMINOPHEN 325 MG PO TABS: 650.0000 mg | ORAL_TABLET | Freq: Four times a day (QID) | ORAL | Status: DC | PRN

## 2021-05-31 MED ORDER — NITROGLYCERIN IN D5W 200-5 MCG/ML-% IV SOLN
INTRAVENOUS | Status: DC | PRN
  Administered 2021-05-31 (×2): .02 mg via INTRAVENOUS

## 2021-05-31 MED ORDER — MORPHINE SULFATE (PF) 2 MG/ML IV SOLN: 1.0000 mg | INTRAVENOUS | Status: DC | PRN

## 2021-05-31 SURGICAL SUPPLY — 60 items
BAG COUNTER SPONGE SURGICOUNT (BAG) ×3 IMPLANT
BAG DECANTER FOR FLEXI CONT (MISCELLANEOUS) IMPLANT
BLADE CLIPPER SURG (BLADE) IMPLANT
BLADE STERNUM SYSTEM 6 (BLADE) IMPLANT
CABLE ADAPT CONN TEMP 6FT (ADAPTER) ×3 IMPLANT
CANISTER SUCT 3000ML PPV (MISCELLANEOUS) IMPLANT
CATH DIAG EXPO 6F AL1 (CATHETERS) IMPLANT
CATH DIAG EXPO 6F VENT PIG 145 (CATHETERS) ×6 IMPLANT
CATH INFINITI 6F AL2 (CATHETERS) IMPLANT
CATH S G BIP PACING (CATHETERS) ×3 IMPLANT
CHLORAPREP W/TINT 26 (MISCELLANEOUS) ×3 IMPLANT
CLOSURE MYNX CONTROL 6F/7F (Vascular Products) ×1 IMPLANT
CNTNR URN SCR LID CUP LEK RST (MISCELLANEOUS) ×4 IMPLANT
CONT SPEC 4OZ STRL OR WHT (MISCELLANEOUS) ×6
COVER BACK TABLE 80X110 HD (DRAPES) IMPLANT
DECANTER SPIKE VIAL GLASS SM (MISCELLANEOUS) ×3 IMPLANT
DERMABOND ADVANCED (GAUZE/BANDAGES/DRESSINGS) ×2
DERMABOND ADVANCED .7 DNX12 (GAUZE/BANDAGES/DRESSINGS) ×2 IMPLANT
DEVICE CLOSURE PERCLS PRGLD 6F (VASCULAR PRODUCTS) ×4 IMPLANT
DRSG TEGADERM 4X4.75 (GAUZE/BANDAGES/DRESSINGS) ×6 IMPLANT
ELECT REM PT RETURN 9FT ADLT (ELECTROSURGICAL) ×3
ELECTRODE REM PT RTRN 9FT ADLT (ELECTROSURGICAL) ×2 IMPLANT
GAUZE SPONGE 2X2 8PLY NS (GAUZE/BANDAGES/DRESSINGS) ×2 IMPLANT
GAUZE SPONGE 4X4 12PLY STRL (GAUZE/BANDAGES/DRESSINGS) ×3 IMPLANT
GLOVE SURG ENC MOIS LTX SZ7.5 (GLOVE) IMPLANT
GLOVE SURG ENC MOIS LTX SZ8 (GLOVE) IMPLANT
GLOVE SURG ORTHO LTX SZ7.5 (GLOVE) IMPLANT
GOWN STRL REUS W/ TWL LRG LVL3 (GOWN DISPOSABLE) IMPLANT
GOWN STRL REUS W/ TWL XL LVL3 (GOWN DISPOSABLE) ×2 IMPLANT
GOWN STRL REUS W/TWL LRG LVL3 (GOWN DISPOSABLE)
GOWN STRL REUS W/TWL XL LVL3 (GOWN DISPOSABLE) ×3
GUIDEWIRE SAF TJ AMPL .035X180 (WIRE) ×4 IMPLANT
GUIDEWIRE SAFE TJ AMPLATZ EXST (WIRE) ×3 IMPLANT
KIT BASIN OR (CUSTOM PROCEDURE TRAY) ×3 IMPLANT
KIT HEART LEFT (KITS) ×3 IMPLANT
KIT SAPIAN 3 ULTRA RESILIA 23 (Valve) ×1 IMPLANT
KIT TURNOVER KIT B (KITS) ×3 IMPLANT
NS IRRIG 1000ML POUR BTL (IV SOLUTION) ×3 IMPLANT
PACK ENDO MINOR (CUSTOM PROCEDURE TRAY) ×3 IMPLANT
PAD ARMBOARD 7.5X6 YLW CONV (MISCELLANEOUS) ×6 IMPLANT
PAD ELECT DEFIB RADIOL ZOLL (MISCELLANEOUS) ×3 IMPLANT
PERCLOSE PROGLIDE 6F (VASCULAR PRODUCTS) ×9
POSITIONER HEAD DONUT 9IN (MISCELLANEOUS) ×3 IMPLANT
SET MICROPUNCTURE 5F STIFF (MISCELLANEOUS) ×3 IMPLANT
SHEATH BRITE TIP 7FR 35CM (SHEATH) ×3 IMPLANT
SHEATH PINNACLE 6F 10CM (SHEATH) ×3 IMPLANT
SHEATH PINNACLE 8F 10CM (SHEATH) ×3 IMPLANT
SLEEVE REPOSITIONING LENGTH 30 (MISCELLANEOUS) ×3 IMPLANT
STOPCOCK MORSE 400PSI 3WAY (MISCELLANEOUS) ×6 IMPLANT
SUT PROLENE 6 0 C 1 30 (SUTURE) IMPLANT
SUT SILK  1 MH (SUTURE) ×3
SUT SILK 1 MH (SUTURE) ×2 IMPLANT
SYR 50ML LL SCALE MARK (SYRINGE) ×3 IMPLANT
SYR BULB IRRIG 60ML STRL (SYRINGE) IMPLANT
TOWEL GREEN STERILE (TOWEL DISPOSABLE) ×6 IMPLANT
TRANSDUCER W/STOPCOCK (MISCELLANEOUS) ×6 IMPLANT
TRAY FOLEY SLVR 14FR TEMP STAT (SET/KITS/TRAYS/PACK) IMPLANT
TUBE SUCT INTRACARD DLP 20F (MISCELLANEOUS) IMPLANT
WIRE EMERALD 3MM-J .035X150CM (WIRE) ×3 IMPLANT
WIRE EMERALD 3MM-J .035X260CM (WIRE) ×3 IMPLANT

## 2021-05-31 NOTE — Op Note (Signed)
?HEART AND VASCULAR CENTER   ?MULTIDISCIPLINARY HEART VALVE TEAM ? ? ?TAVR OPERATIVE NOTE ? ? ?Date of Procedure:  05/31/2021 ? ?Preoperative Diagnosis: Severe Aortic Stenosis  ? ?Postoperative Diagnosis: Same  ? ?Procedure:  ? ?Transcatheter Aortic Valve Replacement - Percutaneous  Transfemoral Approach ? Edwards Sapien 3 Ultra THV (size 23 mm) ?  ?Co-Surgeons:  Gaye Pollack, MD and Sherren Mocha, MD ? ?Anesthesiologist:  Annye Asa, MD ? ?Echocardiographer:  Jenkins Rouge, MD ? ?Pre-operative Echo Findings: ?Severe aortic stenosis ?Normal left ventricular systolic function ? ?Post-operative Echo Findings: ?No paravalvular leak ?Normal/unchanged left ventricular systolic function ? ?BRIEF CLINICAL NOTE AND INDICATIONS FOR SURGERY ? ?88 year old woman with severe, symptomatic aortic stenosis (stage D3 paradoxical low-flow low gradient).  She is also found to have rheumatic mitral valve disease and is anticoagulated with warfarin.  After undergoing multidisciplinary heart team review of her case, she presents today for TAVR for treatment of her severe aortic stenosis. ? ?During the course of the patient's preoperative work up they have been evaluated comprehensively by a multidisciplinary team of specialists coordinated through the [facility] in the [facility].  They have been demonstrated to suffer from symptomatic severe aortic stenosis as noted above. The patient has been counseled extensively as to the relative risks and benefits of all options for the treatment of severe aortic stenosis including long term medical therapy, conventional surgery for aortic valve replacement, and transcatheter aortic valve replacement.  The patient has been independently evaluated in formal cardiac surgical consultation by Dr Cyndia Bent, who deemed the patient appropriate for TAVR. Based upon review of all of the patient's preoperative diagnostic tests they are felt to be  candidate for transcatheter aortic valve replacement using the transfemoral approach as an alternative to conventional surgery.   ? ?Following the decision to proceed with transcatheter aortic valve replacement, a discussion has been held regarding what types of management strategies would be attempted intraoperatively in the event of life-threatening complications, including whether or not the patient would be considered a candidate for the use of cardiopulmonary bypass and/or conversion to open sternotomy for attempted surgical intervention.  The patient has been advised of a variety of complications that might develop peculiar to this approach including but not limited to risks of death, stroke, paravalvular leak, aortic dissection or other major vascular complications, aortic annulus rupture, device embolization, cardiac rupture or perforation, acute myocardial infarction, arrhythmia, heart block or bradycardia requiring permanent pacemaker placement, congestive heart failure, respiratory failure, renal failure, pneumonia, infection, other late complications related to structural valve deterioration or migration, or other complications that might ultimately cause a temporary or permanent loss of functional independence or other long term morbidity.  The patient provides full informed consent for the procedure as described and all questions were answered preoperatively. ? ?DETAILS OF THE OPERATIVE PROCEDURE ? ?PREPARATION:   ?The patient is brought to the operating room on the above mentioned date and central monitoring was established by the anesthesia team including placement of a radial arterial line. The patient is placed in the supine position on the operating table.  Intravenous antibiotics are administered. The patient is monitored closely throughout the procedure under conscious sedation. ? ?Baseline transthoracic echocardiogram is performed. The patient's chest, abdomen, both groins, and both lower  extremities are prepared and draped in a sterile manner. A time out procedure is performed. ? ? ?PERIPHERAL ACCESS:   ?Using ultrasound guidance, femoral arterial and venous access is obtained with placement of  6 Fr sheaths on the left side.  [place] images are digitally captured and stored in the patient's chart. A pigtail diagnostic catheter was passed through the femoral arterial sheath under fluoroscopic guidance into the aortic root.  A temporary transvenous pacemaker catheter was passed through the femoral venous sheath under fluoroscopic guidance into the right ventricle.  The pacemaker was tested to ensure stable lead placement and pacemaker capture. Aortic root angiography was performed in order to determine the optimal angiographic angle for valve deployment. ? ?TRANSFEMORAL ACCESS:  ?A micropuncture technique is used to access the right femoral artery under fluoroscopic and ultrasound guidance.  2 Perclose devices are deployed at 10' and 2' positions to 'PreClose' the femoral artery. An 8 French sheath is placed and then an Amplatz Superstiff wire is advanced through the sheath. This is changed out for a 14 French transfemoral E-Sheath after progressively dilating over the Superstiff wire.  An AL-1 catheter was used to direct a straight-tip exchange length wire across the native aortic valve into the left ventricle. This was exchanged out for a pigtail catheter and position was confirmed in the LV apex. Simultaneous LV and Ao pressures were recorded.  The pigtail catheter was exchanged for an Amplatz Extra-stiff wire in the LV apex.   ? ?BALLOON AORTIC VALVULOPLASTY:  ?Not performed ? ?TRANSCATHETER HEART VALVE DEPLOYMENT:  ?An Edwards Sapien 3 transcatheter heart valve (size 23 mm) was prepared and crimped per manufacturer's guidelines, and the proper orientation of the valve is confirmed on the [employer] delivery system. The valve was advanced through the introducer sheath using normal technique  until in an appropriate position in the abdominal aorta beyond the sheath tip. The balloon was then retracted and using the fine-tuning wheel was centered on the valve. The valve was then advanced across the aortic arch using appropriate flexion of the catheter. The valve was carefully positioned across the aortic valve annulus. The Commander catheter was retracted using normal technique. Once final position of the valve has been confirmed by angiographic assessment, the valve is deployed while temporarily holding ventilation and during rapid ventricular pacing to maintain systolic blood pressure < 50 mmHg and pulse pressure < 10 mmHg. The balloon inflation is held for >3 seconds after reaching full deployment volume. Once the balloon has fully deflated the balloon is retracted into the ascending aorta and valve function is assessed using echocardiography. The patient's hemodynamic recovery following valve deployment is good.  The deployment balloon and guidewire are both removed. Echo demostrated acceptable post-procedural gradients, stable mitral valve function, and no aortic insufficiency.  ? ? ?PROCEDURE COMPLETION:  ?The sheath was removed and femoral artery closure is performed using the 2 previously deployed Perclose devices.  Protamine is administered once femoral arterial repair was complete. The site is clear with no evidence of bleeding or hematoma after the sutures are tightened. The temporary pacemaker and pigtail catheters are removed. Mynx closure is used for contralateral femoral arterial hemostasis for the 6 Fr sheath. ? ?The patient tolerated the procedure well and is transported to the recovery area in stable condition. There were no immediate intraoperative complications. All sponge instrument and needle counts are verified correct at completion of the operation.  ? ?The patient received a total of 40 mL of intravenous contrast during the procedure. ? ? ?Sherren Mocha, MD ?05/31/2021 ?1:14 PM  ?

## 2021-05-31 NOTE — Anesthesia Procedure Notes (Signed)
Arterial Line Insertion Start/End4/12/2021 10:05 AM Performed by: Janace Litten, CRNA, CRNA  Preanesthetic checklist: IV checked, risks and benefits discussed, surgical consent and pre-op evaluation Lidocaine 1% used for infiltration Left, radial was placed Hand hygiene performed  and maximum sterile barriers used   Procedure performed without using ultrasound guided technique. Following insertion, dressing applied and Biopatch. Post procedure assessment: normal  Patient tolerated the procedure well with no immediate complications.

## 2021-05-31 NOTE — Anesthesia Postprocedure Evaluation (Signed)
Anesthesia Post Note ? ?Patient: OZIE DIMARIA ? ?Procedure(s) Performed: Transcatheter Aortic Valve Replacement 53m, Transfemoral (Chest) ?INTRAOPERATIVE TRANSTHORACIC ECHOCARDIOGRAM ? ?  ? ?Patient location during evaluation: Cath Lab ?Anesthesia Type: MAC ?Level of consciousness: sedated and patient cooperative ?Pain management: pain level controlled ?Vital Signs Assessment: post-procedure vital signs reviewed and stable ?Respiratory status: spontaneous breathing, nonlabored ventilation, respiratory function stable and patient connected to nasal cannula oxygen ?Cardiovascular status: blood pressure returned to baseline and stable ?Postop Assessment: no apparent nausea or vomiting ?Anesthetic complications: no ? ? ?No notable events documented. ? ?Last Vitals:  ?Vitals:  ? 05/31/21 0750 05/31/21 1312  ?BP: (!) 145/77 (!) 75/46  ?Pulse: 99 65  ?Resp: 18 13  ?Temp: 36.8 ?C (!) 36.3 ?C  ?SpO2: 96% 99%  ?  ?Last Pain:  ?Vitals:  ? 05/31/21 1312  ?TempSrc: Temporal  ?PainSc: 0-No pain  ? ? ?  ?  ?  ?  ?  ?  ? ?Equilla Que,E. Monasia Lair ? ? ? ? ?

## 2021-05-31 NOTE — Progress Notes (Signed)
20 G, L radial arterial line was removed, and manual pressure was held for 10 min. Sterile gauze was applied at the site. No hematoma or bleeding were present. L radial pule was 2+, and capillary refill < 3 sec. ? The patient is still sleepy, but appropriate when awake. ?

## 2021-05-31 NOTE — Anesthesia Procedure Notes (Signed)
Procedure Name: [place] ?Date/Time: 05/31/2021 11:33 AM ?Performed by: Janene Harvey, CRNA ?Pre-anesthesia Checklist: Patient identified, Emergency Drugs available, Suction available and Patient being monitored ?Patient Re-evaluated:Patient Re-evaluated prior to induction ?Oxygen Delivery Method: Simple face mask ?Induction Type: IV induction ?Placement Confirmation: positive ETCO2 ?Dental Injury: Teeth and Oropharynx as per pre-operative assessment  ? ? ? ? ?

## 2021-05-31 NOTE — Discharge Summary (Addendum)
?[facility]ENTER   ?MULTIDISCIPLINARY HEART VALVE TEAM ? ?Discharge Summary  ?  ?Patient ID: Anne Wolf ?MRN: [id number]; DOB: [birth year] ? ?Admit date: 05/31/2021 ?Discharge date: 06/01/2021 ? ?Primary Care Provider: Asencion Noble, MD  ?Primary Cardiologist: Anne Dolly, MD /Dr. Burt Knack, MD & Dr. Cyndia Bent, MD (TAVR) ? ?Discharge Diagnoses  ?  ?Principal Problem: ?  Aortic stenosis ?Active Problems: ?  Mixed hyperlipidemia ?  Atrial fibrillation, chronic ([place]) ?  Long term (current) use of anticoagulants ?  Type 2 diabetes mellitus ([place]) ?  UTI (urinary tract infection) ?  S/P TAVR (transcatheter aortic valve replacement) ? ?Allergies ?Allergies  ?Allergen Reactions  ? Cat Hair Extract Other (See Comments)  ?  Red eyes and Sneezing ?Also allergic to Dogs  ? ?Diagnostic Studies/Procedures  ?  ?  ?TAVR OPERATIVE NOTE ?  ?  ?Date of Procedure:                05/31/2021 ?  ?Preoperative Diagnosis:      Severe Aortic Stenosis  ?  ?Postoperative Diagnosis:    Same  ?  ?Procedure:      ?  ?Transcatheter Aortic Valve Replacement - Percutaneous Right Transfemoral Approach ?            Edwards Sapien 3 Ultra Resilia THV (size 23 mm, model # 9755RSL, serial # [id number]) ?             ?Co-Surgeons:                        Anne Pollack, MD and Anne Mocha, MD  ?  ?  ?Anesthesiologist:                  Anne Asa, MD ?  ?Echocardiographer:              Anne Rouge, MD ?  ?Pre-operative Echo Findings: ?Severe aortic stenosis ?Normal left ventricular systolic function ?  ?Post-operative Echo Findings: ?No paravalvular leak ?Normal left ventricular systolic function ?____________ ?  ?Echo 06/01/21:  ? ? 1. Left ventricular ejection fraction, by estimation, is 60 to 65%. The  ?left ventricle has normal function. The left ventricle has no regional  ?wall motion abnormalities. Left ventricular diastolic function could not  ?be evaluated.  ? 2. Right ventricular systolic function is normal. The right ventricular   ?size is normal. There is moderately elevated pulmonary artery systolic  ?pressure. The estimated right ventricular systolic pressure is 28.3 mmHg.  ? 3. Left atrial size was severely dilated.  ? 4. Right atrial size was severely dilated.  ? 5. There is diastolic doming of the anterior mitral valve leaflet. . The  ?mitral valve is rheumatic. Trivial mitral valve regurgitation. Moderate  ?mitral stenosis. The mean mitral valve gradient is 9.0 mmHg. Moderate to  ?severe mitral annular  ?calcification.  ? 6. The aortic valve has been repaired/replaced. Aortic valve  ?regurgitation is not visualized. No aortic stenosis is present. Aortic  ?valve area, by VTI measures 2.15 cm?Marland Kitchen Aortic valve mean gradient measures  ?12.0 mmHg. Aortic valve Vmax measures 2.65 m/s.  ? 7. The inferior vena cava is dilated in size with >50% respiratory  ?variability, suggesting right atrial pressure of 8 mmHg.  ? 8. Compared to study dated 05/31/2021, the mean TAVR gradient has  ?increased from 80mHg to 152mg, Vmax has increased from 2.4882mto 2.36m74m?and DVI has increased from 0.58 to 0.62. ? ?History of Present Illness   ?  ?  Anne Wolf is a 88 y.o. female with a history of chronic afib on Coumadin (nosebleeds with Eliquis), HTN, HLD, DMT2, rheumatic heart disease, mitral valve disease, chronic diastolic CHF with recent admission in [place], and diagnosed with paradoxical LFLG AS and continued to have shortness of breath. Seen by Anne Wolf and set up for CT scans which showed possible LAA thrombus. Then had an acute ischemic right basal ganglia CVA in the setting of subtherapeutic INR on 12/30. Seen back by Anne Wolf on 1/30 and doing well with no residual deficits therefore she presented to [facility] on 05/31/21 for planned TAVR.  ? ?Anne Wolf was initially seen 01/18/2021 in consultation for treatment options regarding severe aortic stenosis. As above, she had visited her son in [place] and presented with acute shortness of  breath and was found to have paradoxical low-flow low gradient aortic stenosis during her hospital admission. When she returned from [place], an echocardiogram was completed on 12/27/2020 that demonstrating an LVEF of 60 to 65%, mild to moderate mitral stenosis with a mean transmitral gradient of 10 mmHg and mitral valve area by continuity equation of 1.8 cm?Marland Kitchen She was felt to have moderate to severe aortic stenosis with a mean gradient of 29 mmHg, DVI of 0.3 and normal LV stroke-volume. Cardiac catheterization was completed which demonstrated patent coronary arteries with moderate to severe aortic stenosis mean transvalvular gradient 28 mmHg calculated valve area 1.1 cm? and normal right heart hemodynamics. A few weeks after her cardiac catheterization she presented with acute stroke symptoms. Her symptoms resolved without specific treatment. The pre-TAVR CTA studies showed left atrial appendage thrombus. 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 On structural evaluation, she had completely recovered from her stroke with no residual deficit. ? ?Plan at that time was to let her heal from her stroke and have her seen by Anne Wolf several months later. She has since been evaluated by the multidisciplinary valve team, including Anne Wolf and felt to have severe, symptomatic aortic stenosis and to be a suitable candidate for TAVR, which was set up for 05/31/21.    ? ?Hospital Course  ?   ?Severe AS: s/p successful TAVR with a 23 mm Edwards Sapien 3UR THV via the TF approach on 05/31/21. Post operative echo with normal LVEF, AVA 2.15cm2, mean gradient 856mHg (increased from 24mg intra procedure), peak 28.56m64m. Groin sites are stable. ECG with atrial fibrillation with rates in the 90's to low 100's however home Toprol held post procedure and will be restarted today. There has been no high grade  heart block. She has been resumed on home Coumadin and has follow up INR check 4/17. She has ambulated with CRI with no issues. Post TAVR site care reviewed with the patient. SBE discussed and will be RX'ed at follow up next week.  ? ?Chronic atrial fibrillation on Coumadin: Resumed on home Coumadin dosing. She will bridge with home Lovenox dosing until Sunday, then has follow up INR check 4/17. She will take her own Lovenox injection today after discharge. This was reviewed with Dr. CooBurt Knackd the patients husband.  ? ?Chronic diastolic CHF: Appears euvolemic on exam today. Unclear LVEDP from procedure notes.  ? ?DM2: Plan to restart Metformin tomorrow.  ? ?HLD: Resume Co-Q 10 ? ?UTI: UA positive for UTI prior to TAVR treated with a 5-day course of Macrobid. No urinary symptoms.  ? ?Incidental findings: Cardiomegaly  with left atrial dilatation, with filling defect in the left atrial appendage concerning for left atrial appendage thrombus. Correlation with transesophageal echocardiography is recommended to exclude the presence of thrombus if not already performed. (Pt had CVA 12/30). Morphologic changes in the liver indicative of early cirrhosis. ? ?Consultants: None   ? ?The patient has been seen and examined by Anne Wolf who feels that she is stable and ready for discharge today, 06/01/21 ?_____________ ? ?Discharge Vitals ?Blood pressure (!) 103/45, pulse 92, temperature 98.5 ?F (36.9 ?C), temperature source Oral, resp. rate 16, height 5' (1.524 m), weight 70.2 kg, SpO2 96 %.  ?Filed Weights  ? 05/31/21 0750 06/01/21 0441  ?Weight: 69.4 kg 70.2 kg  ? ?General: Well developed, well nourished, NAD ?Skin: Warm, dry, intact  ?Lungs:Clear to ausculation bilaterally. No wheezes, rales, or rhonchi. Breathing is unlabored. ?Cardiovascular: Irregularly irregular. No murmurs ?Extremities: No edema.  ?Neuro: Alert and oriented. No focal deficits. No facial asymmetry. MAE spontaneously. ?Psych: Responds to questions  appropriately with normal affect.   ? ?Labs & Radiologic Studies  ?  ?CBC ?Recent Labs  ?  05/31/21 ?1334 06/01/21 ?0134  ?WBC  --  9.5  ?HGB 11.9* 13.3  ?HCT 35.0* 40.6  ?MCV  --  91.2  ?PLT  --  105*  ? ?Basic Metabo

## 2021-05-31 NOTE — Interval H&P Note (Signed)
History and Physical Interval Note: ? ?05/31/2021 ?9:53 AM ? ?Anne Wolf  has presented today for surgery, with the diagnosis of Severe Aortic Stenosis.  The various methods of treatment have been discussed with the patient and family. After consideration of risks, benefits and other options for treatment, the patient has consented to  Procedure(s): ?Transcatheter Aortic Valve Replacement, Transfemoral (N/A) ?INTRAOPERATIVE TRANSTHORACIC ECHOCARDIOGRAM (N/A) as a surgical intervention.  The patient's history has been reviewed, patient examined, no change in status, stable for surgery.  I have reviewed the patient's chart and labs.  Questions were answered to the patient's satisfaction.   ? ? ?Gaye Pollack ? ? ?

## 2021-05-31 NOTE — Progress Notes (Signed)
ANTICOAGULATION CONSULT NOTE - Initial Consult ? ?Pharmacy Consult for Warfarin ?Indication: atrial fibrillation ? ?Allergies  ?Allergen Reactions  ? Cat Hair Extract Other (See Comments)  ?  Red eyes and Sneezing ?Also allergic to Dogs  ? ? ?Patient Measurements: ?Height: 5' (152.4 cm) ?Weight: 69.4 kg (153 lb) ?IBW/kg (Calculated) : 45.5 ? ?Vital Signs: ?Temp: 86.7 ?F (30.4 ?C) (04/11 1413) ?Temp Source: Temporal (04/11 1413) ?BP: 131/66 (04/11 1413) ?Pulse Rate: 56 (04/11 1413) ? ?Labs: ?Recent Labs  ?  05/31/21 ?2563 05/31/21 ?1139 05/31/21 ?1334  ?HGB  --  11.9* 11.9*  ?HCT  --  35.0* 35.0*  ?LABPROT 14.2  --   --   ?INR 1.1  --   --   ?CREATININE  --  0.60 0.60  ? ? ?Estimated Creatinine Clearance: 44.7 mL/min (by C-G formula based on SCr of 0.6 mg/dL). ? ? ?Medical History: ?Past Medical History:  ?Diagnosis Date  ? Aortic stenosis   ? mild; mild MR; slightly increased pulmonary artery pressure with mild RVH; normal LV-2010  ? Arthritis   ? Atrial fibrillation ([place])   ? chronic anticoagulation; adequate HR control on minimal AV nodal blocking medication   ? Cancer [facility])   ? skin cancers removed from face  ? CHF (congestive heart failure) ([place])   ? Diabetes mellitus without complication ([place])   ? 2  ? Dyspnea   ? Fasting hyperglycemia   ? + microalbuminuria; A1c of 6.5% in 11/2010  ? Hematochezia 01/2009  ? 01/2009-presumed ischemic colitis; history of diverticulosis  ? History of kidney stones   ? Hx of adenomatous colonic polyps 2005  ? adenomatous polyp; negative colonoscopy in 2009  ? Hyperlipidemia   ? Stroke [facility])   ? ? ?Assessment: ?88 yo female on warfarin prior to admission for Afib. Patient was on enoxaparin bridge 5 days prior to TAVR with warfarin '2mg'$  daily as previous warfarin regimen before bridge and was noted to be therapeutic on this regimen in March 2023. Pharmacy consulted to dose warfarin. INR 1.1 on admission. Hgb 11.9.  ? ?Goal of Therapy:  ?INR 2-3 ?Monitor platelets by anticoagulation  protocol: Yes ?  ?Plan:  ?Warfarin '2mg'$  x1  ?Monitor INR, CBC and s/s of bleeding  ? ?Cristela Felt, PharmD, BCPS ?Clinical Pharmacist ?05/31/2021 3:39 PM ? ? ? ?

## 2021-05-31 NOTE — Anesthesia Preprocedure Evaluation (Addendum)
Anesthesia Evaluation  ?Patient identified by MRN, date of birth, ID band ?Patient awake ? ? ? ?Reviewed: ?Allergy & Precautions, NPO status , Patient's Chart, lab work & pertinent test results, reviewed documented beta blocker date and time  ? ?History of Anesthesia Complications ?Negative for: history of anesthetic complications ? ?Airway ?Mallampati: I ? ?TM Distance: >3 FB ?Neck ROM: Full ? ? ? Dental ? ?(+) Dental Advisory Given, Teeth Intact ?  ?Pulmonary ?neg pulmonary ROS,  ?  ?breath sounds clear to auscultation ? ? ? ? ? ? Cardiovascular ?hypertension, Pt. on medications and Pt. on home beta blockers ?(-) CAD + dysrhythmias Atrial Fibrillation + Valvular Problems/Murmurs AS  ?Rhythm:Irregular Rate:Normal ?+ Systolic murmurs ?05/23/2021 ECHO: EF 60-65%, normal LVF, mild LVH, normal RVF, mod MS with mild MR, mild-mod AI with severe AS, mean gradient 33 mmHg ? ?01/2021 CATH: 1.  Patent coronary arteries with no significant stenoses.  Mild calcification noted. ?2.  Moderate to severe aortic stenosis with mean transvalvular gradient 28 mmHg and calculated aortic valve area 1.1 cm? ? ?  ?Neuro/Psych ?CVA, No Residual Symptoms   ? GI/Hepatic ?negative GI ROS, Neg liver ROS,   ?Endo/Other  ?diabetes (glu 105), Oral Hypoglycemic Agents ? Renal/GU ?negative Renal ROS  ? ?  ?Musculoskeletal ? ? Abdominal ?  ?Peds ? Hematology ?Coumadin: INR 1.1   ?Anesthesia Other Findings ? ? Reproductive/Obstetrics ? ?  ? ? ? ? ? ? ? ? ? ? ? ? ? ?  ?  ? ? ? ? ? ? ? ?Anesthesia Physical ?Anesthesia Plan ? ?ASA: 3 ? ?Anesthesia Plan: MAC  ? ?Post-op Pain Management: Tylenol PO (pre-op)*  ? ?Induction:  ? ?PONV Risk Score and Plan: 2 and Ondansetron, Dexamethasone and Treatment may vary due to age or medical condition ? ?Airway Management Planned: Natural Airway and Simple Face Mask ? ?Additional Equipment: Arterial line ? ?Intra-op Plan:  ? ?Post-operative Plan:  ? ?Informed Consent: I have reviewed  the patients History and Physical, chart, labs and discussed the procedure including the risks, benefits and alternatives for the proposed anesthesia with the patient or authorized representative who has indicated his/her understanding and acceptance.  ? ? ? ?Dental advisory given ? ?Plan Discussed with: CRNA and Surgeon ? ?Anesthesia Plan Comments:   ? ? ? ? ? ?Anesthesia Quick Evaluation ? ?

## 2021-05-31 NOTE — Progress Notes (Signed)
Could not get an oral or axillary temp on patient. Rectal temp taken and 95.4. RN paged the TAVR PA. No response at this time. Night shift nurse notified and aware.  ?[place] C Anett Ranker  ?

## 2021-05-31 NOTE — Progress Notes (Signed)
Patient arrived to 4E from Cath lab. Patient alert and oriented. Vitals taken and stable. Tele placed and CCMD notified. Bilateral groin sites assessed and level 0. Patient oriented to unit. Call bell within reach and family at the bedside.  ?Anne Wolf  ?

## 2021-05-31 NOTE — Transfer of Care (Signed)
Immediate Anesthesia Transfer of Care Note ? ?Patient: Anne Wolf ? ?Procedure(s) Performed: Transcatheter Aortic Valve Replacement 55m, Transfemoral (Chest) ?INTRAOPERATIVE TRANSTHORACIC ECHOCARDIOGRAM ? ?Patient Location: Cath Lab ? ?Anesthesia Type:MAC ? ?Level of Consciousness: drowsy and patient cooperative ? ?Airway & Oxygen Therapy: Patient Spontanous Breathing and Patient connected to face mask oxygen ? ?Post-op Assessment: Report given to RN and Post -op Vital signs reviewed and stable ? ?Post vital signs: Reviewed and stable ? ?Last Vitals:  ?Vitals Value Taken Time  ?BP 121/63 (aline)   ?Temp    ?Pulse 61   ?Resp 15   ?SpO2 99%   ? ? ?Last Pain:  ?Vitals:  ? 05/31/21 0923  ?TempSrc:   ?PainSc: 0-No pain  ?   ? ?  ? ?Complications: No notable events documented. ?

## 2021-05-31 NOTE — Progress Notes (Signed)
?  [facility] V[facility]ENTER   ?MULTIDISCIPLINARY HEART VALVE TEAM ? ?Patient doing well s/p TAVR. She is hemodynamically stable however remains rather sleepy from anesthesia still. Attempting to wean IV Levophed. BPs very stable at this time. Groin sites stable. ECG with no high grade block. Plan to DC arterial line and transfer to 4E.  Plan for early ambulation after bedrest completed and hopeful discharge over the next 24-48 hours.  ? ? ?Kathyrn Drown NP-C ?Structural Heart Team  ?Pager: [phone] ?Phone: [phone] ? ?

## 2021-05-31 NOTE — Progress Notes (Signed)
This nurse spoke to unit nurse [place] RN. Declines need for line for vasopressor. Vasopressor used in cath lab. Fran Lowes, RN VAST ?

## 2021-05-31 NOTE — Progress Notes (Signed)
?  Echocardiogram ?2D Echocardiogram has been performed. ? ?Anne Wolf ?05/31/2021, 12:51 PM ?

## 2021-05-31 NOTE — Op Note (Signed)
?HEART AND VASCULAR CENTER   ?MULTIDISCIPLINARY HEART VALVE TEAM ? ? ?TAVR OPERATIVE NOTE ? ? ?Date of Procedure:  05/31/2021 ? ?Preoperative Diagnosis: Severe Aortic Stenosis  ? ?Postoperative Diagnosis: Same  ? ?Procedure:  ? ?Transcatheter Aortic Valve Replacement - Percutaneous Right Transfemoral Approach ? Edwards Sapien 3 Ultra Resilia THV (size 23 mm, model # 9755RSL, serial # [id number]) ?  ?Co-Surgeons:  Gaye Pollack, MD and Sherren Mocha, MD  ? ? ?Anesthesiologist:  Annye Asa, MD ? ?Echocardiographer:  Jenkins Rouge, MD ? ?Pre-operative Echo Findings: ?Severe aortic stenosis ?Normal left ventricular systolic function ? ?Post-operative Echo Findings: ?No paravalvular leak ?Normal left ventricular systolic function ? ? ?BRIEF CLINICAL NOTE AND INDICATIONS FOR SURGERY ? ?This 88 year old woman has stage D, severe, symptomatic, aortic stenosis with New York Heart Association class II symptoms of exertional fatigue and shortness of breath.  I have personally reviewed her 2D echocardiogram, cardiac catheterization, and CTA studies.  Her echocardiogram shows a trileaflet aortic valve with severe calcification and thickening.  The mean gradient is 33 mmHg with a valve area 0.74 cm? consistent with severe aortic stenosis.  There is mild to moderate aortic insufficiency.  She also has moderate mitral valve stenosis with a mean gradient of 7 mmHg and a peak gradient of 22 mmHg.  There is severe mitral annular calcification and moderate calcification and thickening of the mitral valve leaflets.  Left ventricular ejection fraction is 60 to 65%.  Cardiac catheterization shows patent coronary arteries without significant stenoses.  The mean gradient across aortic valve was measured at 28 mmHg with a valve area of 1.1 cm?.  Right heart pressures were normal.  I agree that aortic valve replacement is indicated in this patient for relief of her symptoms and to prevent left ventricular deterioration.  Given her age  and comorbid risk factors I think that transcatheter aortic valve replacement would be the best treatment for her.  Her gated cardiac CTA shows anatomy suitable for TAVR using a SAPIEN 3 valve.  Her abdominal and pelvic CTA shows adequate pelvic vascular anatomy to allow transfemoral insertion. ?  ?The patient and her husband were counseled at length regarding treatment alternatives for management of severe symptomatic aortic stenosis. The risks and benefits of surgical intervention has been discussed in detail. Long-term prognosis with medical therapy was discussed. Alternative approaches such as conventional surgical aortic valve replacement, transcatheter aortic valve replacement, and palliative medical therapy were compared and contrasted at length. This discussion was placed in the context of the patient's own specific clinical presentation and past medical history. All of their questions have been addressed.  ?  ?Following the decision to proceed with transcatheter aortic valve replacement, a discussion was held regarding what types of management strategies would be attempted intraoperatively in the event of life-threatening complications, including whether or not the patient would be considered a candidate for the use of cardiopulmonary bypass and/or conversion to open sternotomy for attempted surgical intervention.  Although she is 88 years old she is in good physical condition and I think she would be a candidate for emergent sternotomy to manage any intraoperative complications.  The patient is aware of the fact that transient use of cardiopulmonary bypass may be necessary. The patient has been advised of a variety of complications that might develop including but not limited to risks of death, stroke, paravalvular leak, aortic dissection or other major vascular complications, aortic annulus rupture, device embolization, cardiac rupture or perforation, mitral regurgitation, acute myocardial infarction,  arrhythmia, heart  block or bradycardia requiring permanent pacemaker placement, congestive heart failure, respiratory failure, renal failure, pneumonia, infection, other late complications related to structural valve deterioration or migration, or other complications that might ultimately cause a temporary or permanent loss of functional independence or other long term morbidity. The patient provides full informed consent for the procedure as described and all questions were answered.  ? ?  ? ? ? ?DETAILS OF THE OPERATIVE PROCEDURE ? ?PREPARATION:   ? ?The patient was brought to the operating room on the above mentioned date and appropriate monitoring was established by the anesthesia team. The patient was placed in the supine position on the operating table.  Intravenous antibiotics were administered. The patient was monitored closely throughout the procedure under conscious sedation.   ? ?Baseline transthoracic echocardiogram was performed. The patient's abdomen and both groins were prepped and draped in a sterile manner. A time out procedure was performed. ? ? ?PERIPHERAL ACCESS:   ? ?Using the modified Seldinger technique, femoral arterial and venous access was obtained with placement of 6 Fr sheaths on the left side.  A pigtail diagnostic catheter was passed through the left arterial sheath under fluoroscopic guidance into the aortic root.  A temporary transvenous pacemaker catheter was passed through the left femoral venous sheath under fluoroscopic guidance into the right ventricle.  The pacemaker was tested to ensure stable lead placement and pacemaker capture. Aortic root angiography was performed in order to determine the optimal angiographic angle for valve deployment. ? ? ?TRANSFEMORAL ACCESS:  ? ?Percutaneous transfemoral access and sheath placement was performed using ultrasound guidance.  The right common femoral artery was cannulated using a micropuncture needle and appropriate location was verified  using hand injection angiogram.  A pair of Abbott Perclose percutaneous closure devices were placed and a 6 French sheath replaced into the femoral artery.  The patient was heparinized systemically and ACT verified > 250 seconds.   ? ?A 14 Fr transfemoral E-sheath was introduced into the Right common femoral artery after progressively dilating over an Amplatz superstiff wire. An AL-1 catheter was used to direct a straight-tip exchange length wire across the native aortic valve into the left ventricle. This was exchanged out for a pigtail catheter and position was confirmed in the LV apex. Simultaneous LV and Ao pressures were recorded.  The pigtail catheter was exchanged for an Amplatz Extra-stiff wire in the LV apex.   ? ?BALLOON AORTIC VALVULOPLASTY:  ? ?Not performed.  ? ? ?TRANSCATHETER HEART VALVE DEPLOYMENT:  ? ?An Edwards Sapien 3 Ultra transcatheter heart valve (size 23 mm) was prepared and crimped per manufacturer's guidelines, and the proper orientation of the valve is confirmed on the [employer] delivery system. The valve was advanced through the introducer sheath using normal technique until in an appropriate position in the abdominal aorta beyond the sheath tip. The balloon was then retracted and using the fine-tuning wheel was centered on the valve. The valve was then advanced across the aortic arch using appropriate flexion of the catheter. The valve was carefully positioned across the aortic valve annulus. The Commander catheter was retracted using normal technique. Once final position of the valve has been confirmed by angiographic assessment, the valve is deployed while temporarily holding ventilation and during rapid ventricular pacing to maintain systolic blood pressure < 50 mmHg and pulse pressure < 10 mmHg. The balloon inflation is held for >3 seconds after reaching full deployment volume. Once the balloon has fully deflated the balloon is retracted into the ascending aorta  and valve  function is assessed using echocardiography. There is felt to be no paravalvular leak and no central aortic insufficiency.  The patient's hemodynamic recovery following valve deployment is good.  The deployme

## 2021-06-01 ENCOUNTER — Other Ambulatory Visit: Payer: Self-pay

## 2021-06-01 ENCOUNTER — Inpatient Hospital Stay (HOSPITAL_COMMUNITY): Payer: Medicare PPO

## 2021-06-01 ENCOUNTER — Encounter (HOSPITAL_COMMUNITY): Payer: Self-pay | Admitting: Cardiovascular Disease

## 2021-06-01 DIAGNOSIS — I350 Nonrheumatic aortic (valve) stenosis: Principal | ICD-10-CM

## 2021-06-01 DIAGNOSIS — Z952 Presence of prosthetic heart valve: Secondary | ICD-10-CM | POA: Diagnosis not present

## 2021-06-01 LAB — GLUCOSE, CAPILLARY
Glucose-Capillary: 145 mg/dL — ABNORMAL HIGH (ref 70–99)
Glucose-Capillary: 134 mg/dL — ABNORMAL HIGH (ref 70–99)

## 2021-06-01 LAB — ECHOCARDIOGRAM COMPLETE
AR max vel: 2.15 cm2
AV Area VTI: 2.15 cm2
AV Area mean vel: 2.47 cm2
AV Mean grad: 12 mmHg
AV Peak grad: 28.1 mmHg
Ao pk vel: 2.65 m/s
Height: 60 in
MV VTI: 1.66 cm2
S' Lateral: 2.1 cm
Weight: 2476.8 oz

## 2021-06-01 LAB — BASIC METABOLIC PANEL
Anion gap: 8 (ref 5–15)
BUN: 9 mg/dL (ref 8–23)
CO2: 26 mmol/L (ref 22–32)
Calcium: 9 mg/dL (ref 8.9–10.3)
Chloride: 105 mmol/L (ref 98–111)
Creatinine, Ser: 0.64 mg/dL (ref 0.44–1.00)
GFR, Estimated: >60 mL/min (ref >60)
Glucose, Bld: 109 mg/dL — ABNORMAL HIGH (ref 70–99)
Potassium: 4.1 mmol/L (ref 3.5–5.1)
Sodium: 139 mmol/L (ref 135–145)

## 2021-06-01 LAB — PROTIME-INR
INR: 1.2 (ref 0.8–1.2)
Prothrombin Time: 15.4 seconds — ABNORMAL HIGH (ref 11.4–15.2)

## 2021-06-01 LAB — CBC
HCT: 40.6 % (ref 36.0–46.0)
Hemoglobin: 13.3 g/dL (ref 12.0–15.0)
MCH: 29.9 pg (ref 26.0–34.0)
MCHC: 32.8 g/dL (ref 30.0–36.0)
MCV: 91.2 fL (ref 80.0–100.0)
Platelets: 105 10*3/uL — ABNORMAL LOW (ref 150–400)
RBC: 4.45 MIL/uL (ref 3.87–5.11)
RDW: 14.2 % (ref 11.5–15.5)
WBC: 9.5 10*3/uL (ref 4.0–10.5)
nRBC: 0 % (ref 0.0–0.2)

## 2021-06-01 MED ORDER — METOPROLOL SUCCINATE ER 25 MG PO TB24: 25.0000 mg | ORAL_TABLET | Freq: Every day | ORAL | Status: DC

## 2021-06-01 MED ORDER — WARFARIN SODIUM 2 MG PO TABS: 3.0000 mg | ORAL_TABLET | Freq: Once | ORAL | Status: DC

## 2021-06-01 NOTE — Progress Notes (Signed)
Echocardiogram ?2D Echocardiogram has been performed. ? ?Oneal Deputy Kristopher Delk RDCS ?06/01/2021, 9:32 AM ?

## 2021-06-01 NOTE — Progress Notes (Signed)
CARDIAC REHAB PHASE I  ? ?PRE:  Rate/Rhythm: 115 afib ? ?  BP: sitting 102/46 ? ?  SaO2:  ? ?MODE:  Ambulation: 470 ft  ? ?POST:  Rate/Rhythm: 153 afib ? ?  BP: sitting 108/74  ? ?  SaO2:  ? ?Ambulated without difficulty or c/o. HR up to max 153 x2 atleast. Discussed walking with pt and husband, restrictions, and CRPII. Not interested in CRPII as she walks daily with her husband. ?[id number]  ? ?Yves Dill CES, ACSM ?06/01/2021 ?10:16 AM ? ? ? ? ?

## 2021-06-01 NOTE — Progress Notes (Signed)
Patient given discharge instructions by SWAT RN. Patient taken to vehicle by staff in wheelchair. ? ?Daymon Larsen, RN  ?

## 2021-06-01 NOTE — Progress Notes (Signed)
ANTICOAGULATION CONSULT NOTE ? ?Pharmacy Consult for Warfarin ?Indication: atrial fibrillation ? ?Allergies  ?Allergen Reactions  ? Cat Hair Extract Other (See Comments)  ?  Red eyes and Sneezing ?Also allergic to Dogs  ? ? ?Patient Measurements: ?Height: 5' (152.4 cm) ?Weight: 70.2 kg (154 lb 12.8 oz) ?IBW/kg (Calculated) : 45.5 ? ?Vital Signs: ?Temp: 99 ?F (37.2 ?C) (04/12 0736) ?Temp Source: Oral (04/12 0736) ?BP: 102/50 (04/12 0736) ?Pulse Rate: 108 (04/12 0736) ? ?Labs: ?Recent Labs  ?  05/31/21 ?0350 05/31/21 ?1139 05/31/21 ?1139 05/31/21 ?1334 06/01/21 ?0134  ?HGB  --  11.9*   < > 11.9* 13.3  ?HCT  --  35.0*  --  35.0* 40.6  ?PLT  --   --   --   --  105*  ?LABPROT 14.2  --   --   --  15.4*  ?INR 1.1  --   --   --  1.2  ?CREATININE  --  0.60  --  0.60 0.64  ? < > = values in this interval not displayed.  ? ? ?Estimated Creatinine Clearance: 45 mL/min (by C-G formula based on SCr of 0.64 mg/dL). ? ? ?PTA Anticoagulation (05/25/21 Visit): ?- 2 mg (2 mg x 1) every day ?- held warfarin 5 days before TAVR and bridged with therapeutic lovenox ? ?Assessment: ?88 yo female on warfarin prior to admission for Afib. Pharmacy consulted to dose warfarin. Per [facility] clinic recommendations, continue higher dose of warfarin '3mg'$  x2 days followed by resuming PTA regimen of '2mg'$  daily. INR now 1.2. Hgb stable at 13.3, platelets down 205 > 105.  ? ? ?Goal of Therapy:  ?Heparin level 0.3-0.7 units/ml ?Monitor platelets by anticoagulation protocol: Yes ?  ?Plan:  ?Give warfarin 3 mg PO x1 dose ?Check INR daily while on warfarin ?Continue to monitor H&H and platelets ? ? ? ?Thank you for allowing pharmacy to be a part of this patient?s care. ? ?Ardyth Harps, PharmD ?Clinical Pharmacist ? ? ?

## 2021-06-01 NOTE — TOC Transition Note (Signed)
Transition of Care (TOC) - CM/SW Discharge Note ?Marvetta Gibbons Therapist, sports, BSN ?Transitions of Care ?Unit 4E- RN Case Manager ?See Treatment Team for direct phone #  ? ? ?Patient Details  ?Name: Anne Wolf ?MRN: [id number] ?Date of Birth: 1934/01/06 ? ?Transition of Care (TOC) CM/SW Contact:  ?Dahlia Client, Romeo Rabon, RN ?Phone Number: ?06/01/2021, 1:40 PM ? ? ?Clinical Narrative:    ?Pt from home s/p TAVR, Transition of Care Department [facility]) has reviewed patient and no TOC needs have been identified at this time. Pt stable for transition home today.  ? ?Final next level of care: Home/Self Care ?Barriers to Discharge: No Barriers Identified ? ? ?Patient Goals and CMS Choice ?  ?  ?Choice offered to / list presented to : NA ? ?Discharge Placement ?  ?           ? Home ?  ?  ?  ? ?Discharge Plan and Services ?  ?  ?           ?DME Arranged: N/A ?DME Agency: NA ?  ?  ?  ?HH Arranged: NA ?[place] Agency: NA ?  ?  ?  ? ?Social Determinants of Health (SDOH) Interventions ?  ? ? ?Readmission Risk Interventions ? ?  06/01/2021  ?  1:40 PM  ?Readmission Risk Prevention Plan  ?Post Dischage Appt Complete  ?Medication Screening Complete  ?Transportation Screening Complete  ? ? ? ? ? ?

## 2021-06-02 ENCOUNTER — Telehealth: Payer: Self-pay

## 2021-06-02 MED FILL — Magnesium Sulfate Inj 50%: INTRAMUSCULAR | Qty: 10 | Status: AC

## 2021-06-02 MED FILL — Potassium Chloride Inj 2 mEq/ML: INTRAVENOUS | Qty: 40 | Status: AC

## 2021-06-02 MED FILL — Heparin Sodium (Porcine) Inj 1000 Unit/ML: Qty: 1000 | Status: AC

## 2021-06-02 NOTE — Telephone Encounter (Signed)
Patient contacted regarding discharge from [facility] on 06/01/2021. ? ?Patient understands to follow up with provider Structural Heart APP on 06/10/2021 at 10:30 AM at [facility] office. ?Patient understands discharge instructions? yes ?Patient understands medications and regiment? yes ?Patient understands to bring all medications to this visit? Yes ? ?I also reminded the pt of 06/06/2021 anticoagulation clinic visit.  ? ? ? ? ?

## 2021-06-06 ENCOUNTER — Ambulatory Visit (INDEPENDENT_AMBULATORY_CARE_PROVIDER_SITE_OTHER): Payer: Medicare PPO | Admitting: *Deleted

## 2021-06-06 DIAGNOSIS — I4819 Other persistent atrial fibrillation: Secondary | ICD-10-CM

## 2021-06-06 DIAGNOSIS — Z5181 Encounter for therapeutic drug level monitoring: Secondary | ICD-10-CM

## 2021-06-06 LAB — POCT INR: INR: 2.6 (ref 2.0–3.0)

## 2021-06-06 NOTE — Patient Instructions (Signed)
S/P TAVR on 05/31/21.  ?Continue warfarin 1 tablet daily ?Recheck on 06/27/21 ?

## 2021-06-09 NOTE — Progress Notes (Signed)
?HEART AND VASCULAR CENTER   ?[name] ?                                    ?Cardiology Office Note:   ? ?Date:  06/10/2021  ? ?ID:  Anne Wolf, DOB 1933-07-10, MRN [id number] ? ?PCP:  Asencion Noble, MD  ?[facility] Cardiologist:  Carlyle Dolly, MD / Dr. Burt Knack, MD & Dr. Cyndia Bent, MD (TAVR) ?[place] Electrophysiologist:  None  ? ?Referring MD: Asencion Noble, MD  ? ?[facility] s/p TAVR.  ? ?History of Present Illness:   ? ?Anne Wolf is a 88 y.o. 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  ? ?[name] was initially seen 01/18/2021 in consultation for treatment options regarding severe aortic stenosis. As above, she had visited her son in [place] and presented with acute shortness of breath and was found to have paradoxical low-flow low gradient aortic stenosis during her hospital admission. When she returned from [place], an echocardiogram was completed on 12/27/2020 that demonstrating an LVEF of 60 to 65%, mild to moderate mitral stenosis with a mean transmitral gradient of 10 mmHg and mitral valve area by continuity equation of 1.8 cm?Marland Kitchen She was felt to have moderate to severe aortic stenosis with a mean gradient of 29 mmHg, DVI of 0.3 and normal LV stroke-volume. Cardiac catheterization was completed which demonstrated patent coronary arteries with moderate to severe aortic stenosis mean transvalvular gradient 28 mmHg calculated valve area 1.1 cm? and normal right heart hemodynamics. A few weeks after her cardiac catheterization she presented with acute stroke symptoms. Her symptoms resolved without specific treatment. The pre-TAVR CTA studies showed left atrial appendage thrombus. She was felt to have had a atrial  fibrillation related stroke from left atrial appendage thrombus in the setting of interrupted anticoagulation for her procedures. She recovered with no residual defects.  ? ?She was evaluated by the multidisciplinary valve team and underwent successful TAVR with a 23 mm Edwards Sapien 3UR THV via the TF approach on 05/31/21. Post operative echo with EF 60%, normally functioning TAVR with a mean gradient of 12 mmHg and no PVL as well as mod-severe MAC with rheumatic mitral valve stenosis with a mean gradient of 9 mm hg. She was resumed on home Coumadin with Lovenox bridge (given previous CVA off Coumadin). ? ?Today the patient presents to clinic for follow up. Here with husband. No CP or SOB. No LE edema, orthopnea or PND. No dizziness or syncope. No blood in stool or urine. No palpitations. Has some bruising at groin sites and small lumps at catheter sites ? ? ?Past Medical History:  ?Diagnosis Date  ? Aortic stenosis   ? mild; mild MR; slightly increased pulmonary artery pressure with mild RVH; normal LV-2010  ? Arthritis   ? Atrial fibrillation ([name])   ? chronic anticoagulation; adequate HR control on minimal AV nodal blocking medication   ? Cancer [facility])   ? skin cancers removed from face  ? CHF (congestive heart failure) ([place])   ? Diabetes mellitus without complication ([place])   ? 2  ? Dyspnea   ? Fasting hyperglycemia   ? + microalbuminuria; A1c of 6.5% in 11/2010  ?  Hematochezia 01/2009  ? 01/2009-presumed ischemic colitis; history of diverticulosis  ? History of kidney stones   ? Hx of adenomatous colonic polyps 2005  ? adenomatous polyp; negative colonoscopy in 2009  ? Hyperlipidemia   ? Stroke [facility])   ? ? ?Past Surgical History:  ?Procedure Laterality Date  ? CATARACT EXTRACTION W/PHACO Right 03/23/2014  ? Procedure: CATARACT EXTRACTION PHACO AND INTRAOCULAR LENS PLACEMENT (IOC);  Surgeon: Williams Che, MD;  Location: [facility];  Service: Ophthalmology;  Laterality: Right;  CDE:2.83  ? CATARACT EXTRACTION  W/PHACO Left 07/27/2014  ? Procedure: CATARACT EXTRACTION PHACO AND INTRAOCULAR LENS PLACEMENT LEFT EYE CDE=6.97;  Surgeon: Williams Che, MD;  Location: [facility];  Service: Ophthalmology;  Laterality: Left;  ? COLONOSCOPY  2009  ? negative; prior study with adenomatous polyp  ? CYSTOSCOPY/URETEROSCOPY/HOLMIUM LASER/STENT PLACEMENT Right 08/21/2016  ? Procedure: CYSTOSCOPY/URETEROSCOPY/RETROGRADE PYELOGRAM//STENT PLACEMENT;  Surgeon: Nickie Retort, MD;  Location: [facility];  Service: Urology;  Laterality: Right;  ? EYE SURGERY    ? INTRAOPERATIVE TRANSTHORACIC ECHOCARDIOGRAM N/A 05/31/2021  ? Procedure: INTRAOPERATIVE TRANSTHORACIC ECHOCARDIOGRAM;  Surgeon: Sherren Mocha, MD;  Location: [place];  Service: Open Heart Surgery;  Laterality: N/A;  ? RIGHT/LEFT HEART CATH AND CORONARY ANGIOGRAPHY N/A 02/04/2021  ? Procedure: RIGHT/LEFT HEART CATH AND CORONARY ANGIOGRAPHY;  Surgeon: Sherren Mocha, MD;  Location: [place] CV LAB;  Service: Cardiovascular;  Laterality: N/A;  ? TRANSCATHETER AORTIC VALVE REPLACEMENT, TRANSFEMORAL N/A 05/31/2021  ? Procedure: Transcatheter Aortic Valve Replacement 39m, Transfemoral;  Surgeon: CSherren Mocha MD;  Location: [place]  Service: Open Heart Surgery;  Laterality: N/A;  Percutaneous  ? ? ?Current Medications: ?Current Meds  ?Medication Sig  ? alendronate (FOSAMAX) 70 MG tablet Take 70 mg by mouth once a week.  ? amoxicillin (AMOXIL) 500 MG tablet Take 4 tablets (2,000 mg total) by mouth as directed. 1 hour prior to dental work including cleanings  ? Biotin 5000 MCG CAPS Take 5,000 mcg by mouth daily.  ? calcium carbonate (OSCAL) 1500 (600 Ca) MG TABS tablet Take 600 mg of elemental calcium by mouth daily.  ? cholecalciferol (VITAMIN D) 1000 units tablet Take 1,000 Units by mouth at bedtime.  ? Co-Enzyme Q-10 100 MG CAPS Take 100 mg by mouth at bedtime.  ? ferrous sulfate 325 (65 FE) MG tablet Take 325 mg by mouth daily with breakfast.  ? lisinopril (PRINIVIL,ZESTRIL) 10 MG tablet  Take 10 mg by mouth at bedtime.  ? loratadine (CLARITIN) 10 MG tablet Take 10 mg by mouth at bedtime.  ? metFORMIN (GLUCOPHAGE) 500 MG tablet Take 500 mg by mouth 2 (two) times daily with a meal.   ? metoprolol succinate (TOPROL-XL) 25 MG 24 hr tablet Take 25 mg by mouth at bedtime.  ? Multiple Vitamins-Minerals (CENTRUM SILVER) tablet Take 1 tablet by mouth at bedtime.   ? simvastatin (ZOCOR) 40 MG tablet Take 40 mg by mouth at bedtime.    ? warfarin (COUMADIN) 2 MG tablet Take 2 mg by mouth daily. Pt takes 1 tablet 2 mg daily.  ?  ? ?Allergies:   Cat hair extract  ? ?Social History  ? ?Socioeconomic History  ? Marital status: Married  ?  Spouse name: Not on file  ? Number of children:  2  ? Years of education: Not on file  ? Highest education level: Not on file  ?Occupational History  ? Occupation: Retired  ?Tobacco Use  ? Smoking status: Never  ? Smokeless tobacco: Never  ?Vaping Use  ?  Vaping Use: Never used  ?Substance and Sexual Activity  ? Alcohol use: No  ? Drug use: No  ? Sexual activity: Not Currently  ?Other Topics Concern  ? Not on file  ?Social History Narrative  ? Married with 2 children  ? No regular exercise  ? ?Social Determinants of Health  ? ?Financial Resource Strain: Not on file  ?Food Insecurity: Not on file  ?Transportation Needs: Not on file  ?Physical Activity: Not on file  ?Stress: Not on file  ?Social Connections: Not on file  ?  ? ?Family History: ?The patient's family history includes Arrhythmia in her sister; Cancer in her father. ? ?ROS:   ?Please see the history of present illness.    ?All other systems reviewed and are negative. ? ?EKGs/Labs/Other Studies Reviewed:   ? ?The following studies were reviewed today: ? ?TAVR OPERATIVE NOTE ?  ?  ?Date of Procedure:                05/31/2021 ?  ?Preoperative Diagnosis:      Severe Aortic Stenosis  ?  ?Postoperative Diagnosis:    Same  ?  ?Procedure:      ?  ?Transcatheter Aortic Valve Replacement - Percutaneous Right Transfemoral Approach ?             Edwards Sapien 3 Ultra Resilia THV (size 23 mm, model # 9755RSL, serial # [id number]) ?             ?Co-Surgeons:                        Gaye Pollack, MD and Sherren Mocha, MD  ?  ?  ?Anesthesiologist

## 2021-06-10 ENCOUNTER — Ambulatory Visit: Payer: Medicare PPO | Admitting: Physician Assistant

## 2021-06-10 VITALS — BP 112/58 | HR 81 | Ht 60.0 in | Wt 153.4 lb

## 2021-06-10 DIAGNOSIS — I5032 Chronic diastolic (congestive) heart failure: Secondary | ICD-10-CM | POA: Diagnosis not present

## 2021-06-10 DIAGNOSIS — Z952 Presence of prosthetic heart valve: Secondary | ICD-10-CM

## 2021-06-10 DIAGNOSIS — I10 Essential (primary) hypertension: Secondary | ICD-10-CM | POA: Diagnosis not present

## 2021-06-10 DIAGNOSIS — I4819 Other persistent atrial fibrillation: Secondary | ICD-10-CM

## 2021-06-10 MED ORDER — AMOXICILLIN 500 MG PO TABS: 2000.0000 mg | ORAL_TABLET | ORAL | 12 refills | Status: DC

## 2021-06-10 NOTE — Patient Instructions (Signed)
Medication Instructions:  ?Your physician recommends that you continue on your current medications as directed. Please refer to the Current Medication list given to you today. ? ?*If you need a refill on your cardiac medications before your next appointment, please call your pharmacy* ? ? ?Lab Work: ?None ordered  ? ?If you have labs (blood work) drawn today and your tests are completely normal, you will receive your results only by: ?MyChart Message (if you have MyChart) OR ?A paper copy in the mail ?If you have any lab test that is abnormal or we need to change your treatment, we will call you to review the results. ? ? ?Testing/Procedures: ?None ordered today  ? ? ?Follow-Up: ?Follow up as scheduled  ? ? ?Other Instructions ? ?Important Information About Sugar ? ? ? ? ?  ?

## 2021-06-27 ENCOUNTER — Ambulatory Visit (INDEPENDENT_AMBULATORY_CARE_PROVIDER_SITE_OTHER): Payer: Medicare PPO | Admitting: *Deleted

## 2021-06-27 DIAGNOSIS — I4819 Other persistent atrial fibrillation: Secondary | ICD-10-CM | POA: Diagnosis not present

## 2021-06-27 DIAGNOSIS — Z5181 Encounter for therapeutic drug level monitoring: Secondary | ICD-10-CM

## 2021-06-27 LAB — POCT INR: INR: 1.2 — AB (ref 2.0–3.0)

## 2021-06-27 NOTE — Patient Instructions (Signed)
S/P TAVR on 05/31/21.  ?Take warfarin 2 tablets tonight and tomorrow night then resume 1 tablet daily ?Recheck in 2 wks ?

## 2021-06-30 ENCOUNTER — Other Ambulatory Visit: Payer: Self-pay | Admitting: Cardiology

## 2021-06-30 DIAGNOSIS — Z952 Presence of prosthetic heart valve: Secondary | ICD-10-CM

## 2021-07-05 NOTE — Progress Notes (Signed)
[facility]                                     Cardiology Office Note:    Date:  07/07/2021   ID:  Waldo Laine, DOB 1933-06-26, MRN [id number]  PCP:  Asencion Noble, MD  [facility] Cardiologist:  Carlyle Dolly, MD / Dr. Burt Knack, MD & Dr. Cyndia Bent, MD (TAVR) [facility] Electrophysiologist:  None   Referring MD: Asencion Noble, MD   1 month s/p TAVR.   History of Present Illness:    DENYS LABREE is a 88 y.o. 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   Ms. Blackwelder was initially seen 01/18/2021 in consultation for treatment options regarding severe aortic stenosis. As above, she had visited her son in [place] and presented with acute shortness of breath and was found to have paradoxical low-flow low gradient aortic stenosis during her hospital admission. When she returned from [place], an echocardiogram was completed on 12/27/2020 that demonstrating an LVEF of 60 to 65%, mild to moderate mitral stenosis with a mean transmitral gradient of 10 mmHg and mitral valve area by continuity equation of 1.8 cm. She was felt to have moderate to severe aortic stenosis with a mean gradient of 29 mmHg, DVI of 0.3 and normal LV stroke-volume. Cardiac catheterization was completed which demonstrated patent coronary arteries with moderate to severe aortic stenosis mean transvalvular gradient 28 mmHg calculated valve area 1.1 cm and normal right heart hemodynamics. A few weeks after her cardiac catheterization she presented with acute stroke symptoms. Her symptoms resolved without specific treatment. The pre-TAVR CTA studies showed left atrial appendage thrombus. She was felt to have had a atrial  fibrillation related stroke from left atrial appendage thrombus in the setting of interrupted anticoagulation for her procedures. She recovered with no residual defects.   She was evaluated by the multidisciplinary valve team and underwent successful TAVR with a 23 mm Edwards Sapien 3UR THV via the TF approach on 05/31/21. Post operative echo with EF 60%, normally functioning TAVR with a mean gradient of 12 mmHg and no PVL as well as mod-severe MAC with rheumatic mitral valve stenosis with a mean gradient of 9 mm hg. She was resumed on home Coumadin with Lovenox bridge (given previous CVA off Coumadin).   Today the patient presents to clinic for follow up. Here with husband. No CP or SOB. No LE edema, orthopnea or PND. No dizziness or syncope. No blood in stool or urine. No palpitations.   Past Medical History:  Diagnosis Date   Aortic stenosis    mild; mild MR; slightly increased pulmonary artery pressure with mild RVH; normal LV-2010   Arthritis    Atrial fibrillation (HCC)    chronic anticoagulation; adequate HR control on minimal AV nodal blocking medication    Cancer ([place])    skin cancers removed from face   CHF (congestive heart failure) ([place])    Diabetes mellitus without complication ([place])    2   Dyspnea    Fasting hyperglycemia    + microalbuminuria; A1c of 6.5% in 11/2010   Hematochezia 01/2009   01/2009-presumed ischemic colitis; history of  diverticulosis   History of kidney stones    Hx of adenomatous colonic polyps 2005   adenomatous polyp; negative colonoscopy in 2009   Hyperlipidemia    Stroke [facility])     Past Surgical History:  Procedure Laterality Date   CATARACT EXTRACTION W/PHACO Right 03/23/2014   Procedure: CATARACT EXTRACTION PHACO AND INTRAOCULAR LENS PLACEMENT ([place]);  Surgeon: Williams Che, MD;  Location: [facility];  Service: Ophthalmology;  Laterality: Right;  CDE:2.83   CATARACT EXTRACTION W/PHACO Left 07/27/2014   Procedure: CATARACT EXTRACTION PHACO AND  INTRAOCULAR LENS PLACEMENT LEFT EYE CDE=6.97;  Surgeon: Williams Che, MD;  Location: [facility];  Service: Ophthalmology;  Laterality: Left;   COLONOSCOPY  2009   negative; prior study with adenomatous polyp   CYSTOSCOPY/URETEROSCOPY/HOLMIUM LASER/STENT PLACEMENT Right 08/21/2016   Procedure: CYSTOSCOPY/URETEROSCOPY/RETROGRADE PYELOGRAM//STENT PLACEMENT;  Surgeon: Nickie [place], MD;  Location: [facility];  Service: Urology;  Laterality: Right;   EYE SURGERY     INTRAOPERATIVE TRANSTHORACIC ECHOCARDIOGRAM N/A 05/31/2021   Procedure: INTRAOPERATIVE TRANSTHORACIC ECHOCARDIOGRAM;  Surgeon: Sherren Mocha, MD;  Location: [place];  Service: Open Heart Surgery;  Laterality: N/A;   RIGHT/LEFT HEART CATH AND CORONARY ANGIOGRAPHY N/A 02/04/2021   Procedure: RIGHT/LEFT HEART CATH AND CORONARY ANGIOGRAPHY;  Surgeon: Sherren Mocha, MD;  Location: [place] CV LAB;  Service: Cardiovascular;  Laterality: N/A;   TRANSCATHETER AORTIC VALVE REPLACEMENT, TRANSFEMORAL N/A 05/31/2021   Procedure: Transcatheter Aortic Valve Replacement 72m, Transfemoral;  Surgeon: CSherren Mocha MD;  [place]  Service: Open Heart Surgery;  Laterality: N/A;  Percutaneous    Current Medications: Current Meds  Medication Sig   alendronate (FOSAMAX) 70 MG tablet Take 70 mg by mouth once a week.   amoxicillin (AMOXIL) 500 MG tablet Take 4 tablets (2,000 mg total) by mouth as directed. 1 hour prior to dental work including cleanings   Biotin 5000 MCG CAPS Take 5,000 mcg by mouth daily.   calcium carbonate (OSCAL) 1500 (600 Ca) MG TABS tablet Take 600 mg of elemental calcium by mouth daily.   cholecalciferol (VITAMIN D) 1000 units tablet Take 1,000 Units by mouth at bedtime.   Co-Enzyme Q-10 100 MG CAPS Take 100 mg by mouth at bedtime.   ferrous sulfate 325 (65 FE) MG tablet Take 325 mg by mouth daily with breakfast.   lisinopril (PRINIVIL,ZESTRIL) 10 MG tablet Take 10 mg by mouth at bedtime.   loratadine (CLARITIN) 10 MG  tablet Take 10 mg by mouth at bedtime.   metFORMIN (GLUCOPHAGE) 500 MG tablet Take 500 mg by mouth 2 (two) times daily with a meal.    metoprolol succinate (TOPROL-XL) 25 MG 24 hr tablet Take 25 mg by mouth at bedtime.   Multiple Vitamins-Minerals (CENTRUM SILVER) tablet Take 1 tablet by mouth at bedtime.    simvastatin (ZOCOR) 40 MG tablet Take 40 mg by mouth at bedtime.     warfarin (COUMADIN) 2 MG tablet Take 2 mg by mouth daily. Pt takes 1 tablet 2 mg daily.     Allergies:   Cat hair extract   Social History   Socioeconomic History   Marital status: Married    Spouse name: Not on file   Number of children:  2   Years of education: Not on file   Highest education level: Not on file  Occupational History   Occupation: Retired  Tobacco Use   Smoking status: Never   Smokeless tobacco: Never  Vaping Use   Vaping Use: Never used  Substance and Sexual Activity  Alcohol use: No   Drug use: No   Sexual activity: Not Currently  Other Topics Concern   Not on file  Social History Narrative   Married with 2 children   No regular exercise   Social Determinants of Health   Financial Resource Strain: Not on file  Food Insecurity: Not on file  Transportation Needs: Not on file  Physical Activity: Not on file  Stress: Not on file  Social Connections: Not on file     Family History: The patient's family history includes Arrhythmia in her sister; Cancer in her father.  ROS:   Please see the history of present illness.    All other systems reviewed and are negative.  EKGs/Labs/Other Studies Reviewed:    The following studies were reviewed today:  TAVR OPERATIVE NOTE     Date of Procedure:                05/31/2021   Preoperative Diagnosis:      Severe Aortic Stenosis    Postoperative Diagnosis:    Same    Procedure:        Transcatheter Aortic Valve Replacement - Percutaneous Right Transfemoral Approach             Edwards Sapien 3 Ultra Resilia THV (size 23 mm,  model # [id number], serial # [id number])              Co-Surgeons:                        Gaye Pollack, MD and Sherren Mocha, MD      Anesthesiologist:                  Annye Asa, MD   Echocardiographer:              Jenkins Rouge, MD   Pre-operative Echo Findings: Severe aortic stenosis Normal left ventricular systolic function   Post-operative Echo Findings: No paravalvular leak Normal left ventricular systolic function ____________   Echo 06/01/21:   1. Left ventricular ejection fraction, by estimation, is 60 to 65%. The  left ventricle has normal function. The left ventricle has no regional  wall motion abnormalities. Left ventricular diastolic function could not  be evaluated.   2. Right ventricular systolic function is normal. The right ventricular  size is normal. There is moderately elevated pulmonary artery systolic  pressure. The estimated right ventricular systolic pressure is 40.9 mmHg.   3. Left atrial size was severely dilated.   4. Right atrial size was severely dilated.   5. There is diastolic doming of the anterior mitral valve leaflet. . The  mitral valve is rheumatic. Trivial mitral valve regurgitation. Moderate  mitral stenosis. The mean mitral valve gradient is 9.0 mmHg. Moderate to  severe mitral annular  calcification.   6. The aortic valve has been repaired/replaced. Aortic valve  regurgitation is not visualized. No aortic stenosis is present. Aortic  valve area, by VTI measures 2.15 cm. Aortic valve mean gradient measures  12.0 mmHg. Aortic valve Vmax measures 2.65 m/s.   7. The inferior vena cava is dilated in size with >50% respiratory  variability, suggesting right atrial pressure of 8 mmHg.   8. Compared to study dated 05/31/2021, the mean TAVR gradient has  increased from 33mHg to 115mg, Vmax has increased from 2.4874mto 2.81m64mand DVI has increased from 0.58 to 0.62.   ______________________  Echo 07/06/21 IMPRESSIONS   1. Left  ventricular ejection fraction, by estimation, is 60 to 65%. The  left ventricle has normal function. The left ventricle has no regional  wall motion abnormalities. There is moderate left ventricular hypertrophy  of the basal-septal segment. Left  ventricular diastolic function could not be evaluated.   2. Right ventricular systolic function is normal. The right ventricular  size is normal. There is mildly elevated pulmonary artery systolic  pressure.   3. Left atrial size was severely dilated.   4. Right atrial size was severely dilated.   5. The mitral valve is degenerative. Mild mitral valve regurgitation.  Mild to moderate mitral stenosis. The mean mitral valve gradient is 4.0  mmHg. Moderate to severe mitral annular calcification.   6. Tricuspid valve regurgitation is mild to moderate.   7. The aortic valve is normal in structure. Aortic valve regurgitation is  trivial. No aortic stenosis is present. There is a 23 mm Sapien prosthetic  (TAVR) valve present in the aortic position. Procedure Date: 05/31/21. Echo  findings are consistent with  normal structure and function of the aortic valve prosthesis. Aortic valve  area, by VTI measures 2.17 cm. Aortic valve mean gradient measures 7.2  mmHg. Aortic valve Vmax measures 1.80 m/s.   8. The inferior vena cava is normal in size with greater than 50%  respiratory variability, suggesting right atrial pressure of 3 mmHg.   Comparison(s): 06/01/21 EF 60-65%. Moderate MS 60mHg mean PG, 257mg peak  PG. AV 1235m mean PG, 14m57mPG. PA pressure 50mm82m  EKG:  EKG is NOT ordered today.   Recent Labs: 02/18/2021: Magnesium 1.9 05/27/2021: ALT 20 06/01/2021: BUN 9; Creatinine, Ser 0.64; Hemoglobin 13.3; Platelets 105; Potassium 4.1; Sodium 139  Recent Lipid Panel    Component Value Date/Time   CHOL 152 02/18/2021 0507   TRIG 69 02/18/2021 0507   TRIG 127 03/24/2008 0000   HDL 52 02/18/2021 0507   CHOLHDL 2.9 02/18/2021 0507   VLDL 14  02/18/2021 0507   LDLCALC 86 02/18/2021 0507   LDLCALC 108 03/24/2008 0000     Risk Assessment/Calculations:    CHA2DS2-VASc Score = 6   This indicates a 9.7% annual risk of stroke. The patient's score is based upon: CHF History: 1 HTN History: 1 Diabetes History: 1 Stroke History: 0 Vascular Disease History: 0 Age Score: 2 Gender Score: 1      Physical Exam:    VS:  BP 124/72   Pulse 79   Ht 5' (1.524 m)   Wt 153 lb 6.4 oz (69.6 kg)   BMI 29.96 kg/m     Wt Readings from Last 3 Encounters:  07/06/21 153 lb 6.4 oz (69.6 kg)  06/10/21 153 lb 6.4 oz (69.6 kg)  06/01/21 154 lb 12.8 oz (70.2 kg)     GEN:  Well nourished, well developed in no acute distress HEENT: Normal NECK: No JVD LYMPHATICS: No lymphadenopathy CARDIAC: irreg irreg.  no murmurs, rubs, gallops RESPIRATORY:  Clear to auscultation without rales, wheezing or rhonchi  ABDOMEN: Soft, non-tender, non-distended MUSCULOSKELETAL:  No edema; No deformity  SKIN: Warm and dry.   NEUROLOGIC:  Alert and oriented x 3 PSYCHIATRIC:  Normal affect   ASSESSMENT:    1. S/P TAVR (transcatheter aortic valve replacement)   2. Persistent atrial fibrillation (HCC) [place]. Chronic diastolic CHF (congestive heart failure) (HCC) [place]. Essential hypertension     PLAN:    In order of problems listed above:  Severe AS s/p TAVR: echo today shows  EF 60%, normally functioning TAVR with a mean gradient of 7.2 mm hg and no PVL as well as mild MR/mild to mod MS, severe MAC and mild to mod TR. She has NYHA class I symptoms. She has amoxicillin for SBE prophylaxis. Continue coumadin. I will see her back in 1 year for follow up and echo.    Chronic atrial fibrillation on Coumadin: rate well controlled. INR followed by our Coumadin clinic.  She was bridged pre/post operatively for TAVR   Chronic diastolic CHF: appears euvolemic off diuretics.   HTN: BP well controlled today . No changes made.       Medication Adjustments/Labs  and Tests Ordered: Current medicines are reviewed at length with the patient today.  Concerns regarding medicines are outlined above.  Orders Placed This Encounter  Procedures   ECHOCARDIOGRAM COMPLETE   No orders of the defined types were placed in this encounter.   There are no Patient Instructions on file for this visit.   Signed, Angelena Form, PA-C  07/07/2021 10:08 AM    [facility]

## 2021-07-06 ENCOUNTER — Ambulatory Visit (HOSPITAL_COMMUNITY): Payer: Medicare PPO | Attending: Cardiology

## 2021-07-06 ENCOUNTER — Ambulatory Visit (INDEPENDENT_AMBULATORY_CARE_PROVIDER_SITE_OTHER): Payer: Medicare PPO | Admitting: Physician Assistant

## 2021-07-06 ENCOUNTER — Ambulatory Visit (HOSPITAL_COMMUNITY): Payer: Medicare PPO

## 2021-07-06 VITALS — BP 124/72 | HR 79 | Ht 60.0 in | Wt 153.4 lb

## 2021-07-06 DIAGNOSIS — Z952 Presence of prosthetic heart valve: Secondary | ICD-10-CM | POA: Insufficient documentation

## 2021-07-06 DIAGNOSIS — I10 Essential (primary) hypertension: Secondary | ICD-10-CM

## 2021-07-06 DIAGNOSIS — I5032 Chronic diastolic (congestive) heart failure: Secondary | ICD-10-CM | POA: Diagnosis not present

## 2021-07-06 DIAGNOSIS — I4819 Other persistent atrial fibrillation: Secondary | ICD-10-CM

## 2021-07-06 LAB — ECHOCARDIOGRAM COMPLETE
AR max vel: 2.13 cm2
AV Area VTI: 2.17 cm2
AV Area mean vel: 2.15 cm2
AV Mean grad: 7.3 mmHg
AV Peak grad: 13 mmHg
Ao pk vel: 1.8 m/s
Area-P 1/2: 2.39 cm2
MV VTI: 1.74 cm2
S' Lateral: 2.7 cm

## 2021-07-11 ENCOUNTER — Ambulatory Visit (INDEPENDENT_AMBULATORY_CARE_PROVIDER_SITE_OTHER): Payer: Medicare PPO | Admitting: *Deleted

## 2021-07-11 DIAGNOSIS — I4819 Other persistent atrial fibrillation: Secondary | ICD-10-CM | POA: Diagnosis not present

## 2021-07-11 DIAGNOSIS — Z5181 Encounter for therapeutic drug level monitoring: Secondary | ICD-10-CM | POA: Diagnosis not present

## 2021-07-11 LAB — POCT INR: INR: 3 (ref 2.0–3.0)

## 2021-07-11 NOTE — Patient Instructions (Signed)
S/P TAVR on 05/31/21.  Continue warfarin 1 tablet daily Recheck in 5 wks

## 2021-07-20 DIAGNOSIS — E119 Type 2 diabetes mellitus without complications: Secondary | ICD-10-CM | POA: Diagnosis not present

## 2021-07-20 DIAGNOSIS — I482 Chronic atrial fibrillation, unspecified: Secondary | ICD-10-CM | POA: Diagnosis not present

## 2021-08-12 DIAGNOSIS — M810 Age-related osteoporosis without current pathological fracture: Secondary | ICD-10-CM | POA: Diagnosis not present

## 2021-08-12 DIAGNOSIS — I4891 Unspecified atrial fibrillation: Secondary | ICD-10-CM | POA: Diagnosis not present

## 2021-08-12 DIAGNOSIS — I252 Old myocardial infarction: Secondary | ICD-10-CM | POA: Diagnosis not present

## 2021-08-12 DIAGNOSIS — I509 Heart failure, unspecified: Secondary | ICD-10-CM | POA: Diagnosis not present

## 2021-08-12 DIAGNOSIS — D6869 Other thrombophilia: Secondary | ICD-10-CM | POA: Diagnosis not present

## 2021-08-12 DIAGNOSIS — I2510 Atherosclerotic heart disease of native coronary artery without angina pectoris: Secondary | ICD-10-CM | POA: Diagnosis not present

## 2021-08-12 DIAGNOSIS — E119 Type 2 diabetes mellitus without complications: Secondary | ICD-10-CM | POA: Diagnosis not present

## 2021-08-12 DIAGNOSIS — I110 Hypertensive heart disease with heart failure: Secondary | ICD-10-CM | POA: Diagnosis not present

## 2021-08-12 DIAGNOSIS — E785 Hyperlipidemia, unspecified: Secondary | ICD-10-CM | POA: Diagnosis not present

## 2021-08-15 ENCOUNTER — Ambulatory Visit (INDEPENDENT_AMBULATORY_CARE_PROVIDER_SITE_OTHER): Payer: Medicare PPO | Admitting: *Deleted

## 2021-08-15 DIAGNOSIS — I4819 Other persistent atrial fibrillation: Secondary | ICD-10-CM | POA: Diagnosis not present

## 2021-08-15 DIAGNOSIS — Z5181 Encounter for therapeutic drug level monitoring: Secondary | ICD-10-CM | POA: Diagnosis not present

## 2021-08-15 LAB — POCT INR: INR: 3.9 — AB (ref 2.0–3.0)

## 2021-08-19 ENCOUNTER — Ambulatory Visit
Admission: EM | Admit: 2021-08-19 | Discharge: 2021-08-19 | Disposition: A | Discharge status: Other Acute Inpatient Hospital | Payer: Medicare PPO | Attending: Nurse Practitioner | Admitting: Nurse Practitioner

## 2021-08-19 ENCOUNTER — Emergency Department (HOSPITAL_COMMUNITY)
Admission: EM | Admit: 2021-08-19 | Discharge: 2021-08-19 | Disposition: A | Discharge status: Home / Self Care | Payer: Medicare PPO | Attending: Emergency Medicine | Admitting: Emergency Medicine

## 2021-08-19 ENCOUNTER — Emergency Department (HOSPITAL_COMMUNITY): Payer: Medicare PPO

## 2021-08-19 ENCOUNTER — Encounter (HOSPITAL_COMMUNITY): Payer: Self-pay | Admitting: Emergency Medicine

## 2021-08-19 ENCOUNTER — Other Ambulatory Visit: Payer: Self-pay

## 2021-08-19 DIAGNOSIS — Z7901 Long term (current) use of anticoagulants: Secondary | ICD-10-CM | POA: Diagnosis not present

## 2021-08-19 DIAGNOSIS — I4891 Unspecified atrial fibrillation: Secondary | ICD-10-CM | POA: Insufficient documentation

## 2021-08-19 DIAGNOSIS — N390 Urinary tract infection, site not specified: Secondary | ICD-10-CM

## 2021-08-19 DIAGNOSIS — G459 Transient cerebral ischemic attack, unspecified: Secondary | ICD-10-CM | POA: Diagnosis not present

## 2021-08-19 DIAGNOSIS — R55 Syncope and collapse: Secondary | ICD-10-CM | POA: Diagnosis not present

## 2021-08-19 DIAGNOSIS — I959 Hypotension, unspecified: Secondary | ICD-10-CM | POA: Diagnosis not present

## 2021-08-19 DIAGNOSIS — I517 Cardiomegaly: Secondary | ICD-10-CM | POA: Diagnosis not present

## 2021-08-19 DIAGNOSIS — Z79899 Other long term (current) drug therapy: Secondary | ICD-10-CM | POA: Insufficient documentation

## 2021-08-19 DIAGNOSIS — R531 Weakness: Secondary | ICD-10-CM

## 2021-08-19 DIAGNOSIS — Z7984 Long term (current) use of oral hypoglycemic drugs: Secondary | ICD-10-CM | POA: Insufficient documentation

## 2021-08-19 DIAGNOSIS — R0689 Other abnormalities of breathing: Secondary | ICD-10-CM | POA: Diagnosis not present

## 2021-08-19 DIAGNOSIS — R000 Tachycardia, unspecified: Secondary | ICD-10-CM | POA: Diagnosis not present

## 2021-08-19 LAB — URINALYSIS, ROUTINE W REFLEX MICROSCOPIC
Bilirubin Urine: NEGATIVE
Glucose, UA: NEGATIVE mg/dL
Ketones, ur: 20 mg/dL — AB
Nitrite: POSITIVE — AB
Protein, ur: 30 mg/dL — AB
Specific Gravity, Urine: 1.014 (ref 1.005–1.030)
WBC, UA: >50 WBC/hpf — ABNORMAL HIGH (ref 0–5)
pH: 5 (ref 5.0–8.0)

## 2021-08-19 LAB — CBC WITH DIFFERENTIAL/PLATELET
Abs Immature Granulocytes: 0.06 10*3/uL (ref 0.00–0.07)
Basophils Absolute: 0.1 10*3/uL (ref 0.0–0.1)
Basophils Relative: 0 %
Eosinophils Absolute: 0 10*3/uL (ref 0.0–0.5)
Eosinophils Relative: 0 %
HCT: 40.1 % (ref 36.0–46.0)
Hemoglobin: 13.3 g/dL (ref 12.0–15.0)
Immature Granulocytes: 0 %
Lymphocytes Relative: 14 %
Lymphs Abs: 2 10*3/uL (ref 0.7–4.0)
MCH: 30.2 pg (ref 26.0–34.0)
MCHC: 33.2 g/dL (ref 30.0–36.0)
MCV: 91.1 fL (ref 80.0–100.0)
Monocytes Absolute: 1.5 10*3/uL — ABNORMAL HIGH (ref 0.1–1.0)
Monocytes Relative: 10 %
Neutro Abs: 10.7 10*3/uL — ABNORMAL HIGH (ref 1.7–7.7)
Neutrophils Relative %: 76 %
Platelets: 142 10*3/uL — ABNORMAL LOW (ref 150–400)
RBC: 4.4 MIL/uL (ref 3.87–5.11)
RDW: 13.7 % (ref 11.5–15.5)
WBC: 14.3 10*3/uL — ABNORMAL HIGH (ref 4.0–10.5)
nRBC: 0 % (ref 0.0–0.2)

## 2021-08-19 LAB — POCT URINALYSIS DIP (MANUAL ENTRY)
Bilirubin, UA: NEGATIVE
Glucose, UA: NEGATIVE mg/dL
Ketones, POC UA: NEGATIVE mg/dL
Nitrite, UA: POSITIVE — AB
Spec Grav, UA: 1.02 (ref 1.010–1.025)
Urobilinogen, UA: 0.2 E.U./dL
pH, UA: 6 (ref 5.0–8.0)

## 2021-08-19 LAB — TROPONIN I (HIGH SENSITIVITY)
Troponin I (High Sensitivity): 10 ng/L (ref <18)
Troponin I (High Sensitivity): 11 ng/L (ref <18)

## 2021-08-19 LAB — BASIC METABOLIC PANEL
Anion gap: 8 (ref 5–15)
BUN: 17 mg/dL (ref 8–23)
CO2: 26 mmol/L (ref 22–32)
Calcium: 9 mg/dL (ref 8.9–10.3)
Chloride: 102 mmol/L (ref 98–111)
Creatinine, Ser: 0.79 mg/dL (ref 0.44–1.00)
GFR, Estimated: >60 mL/min (ref >60)
Glucose, Bld: 116 mg/dL — ABNORMAL HIGH (ref 70–99)
Potassium: 4 mmol/L (ref 3.5–5.1)
Sodium: 136 mmol/L (ref 135–145)

## 2021-08-19 LAB — PROTIME-INR
INR: 2.4 — ABNORMAL HIGH (ref 0.8–1.2)
Prothrombin Time: 26.2 seconds — ABNORMAL HIGH (ref 11.4–15.2)

## 2021-08-19 LAB — BRAIN NATRIURETIC PEPTIDE: B Natriuretic Peptide: 188 pg/mL — ABNORMAL HIGH (ref 0.0–100.0)

## 2021-08-19 LAB — MAGNESIUM: Magnesium: 1.6 mg/dL — ABNORMAL LOW (ref 1.7–2.4)

## 2021-08-19 MED ORDER — SODIUM CHLORIDE 0.9 % IV SOLN
1.0000 g | Freq: Once | INTRAVENOUS | Status: AC
  Administered 2021-08-19: 1 g via INTRAVENOUS
  Filled 2021-08-19: qty 10

## 2021-08-19 MED ORDER — MAGNESIUM SULFATE 2 GM/50ML IV SOLN
2.0000 g | Freq: Once | INTRAVENOUS | Status: AC
  Administered 2021-08-19: 2 g via INTRAVENOUS
  Filled 2021-08-19: qty 50

## 2021-08-19 MED ORDER — CEPHALEXIN 500 MG PO CAPS: 500.0000 mg | ORAL_CAPSULE | Freq: Four times a day (QID) | ORAL | 0 refills | Status: DC

## 2021-08-19 MED ORDER — SODIUM CHLORIDE 0.9 % IV BOLUS
500.0000 mL | Freq: Once | INTRAVENOUS | Status: AC
  Administered 2021-08-19: 500 mL via INTRAVENOUS

## 2021-08-19 NOTE — Discharge Instructions (Signed)
Please go directly to the emergency room for further evaluation of your symptoms 

## 2021-08-19 NOTE — ED Provider Notes (Signed)
[facility] EMERGENCY DEPARTMENT Provider Note   CSN: [id number] Arrival date & time: 08/19/21  1704     History  No chief complaint on file.   Anne Wolf is a 88 y.o. female.  She is brought in by EMS from an urgent care office.  She went there because she has felt weak and unsteady for 3 days.  They noticed her to be tachycardic and also have signs of urinary tract infection.  She had a UTI few weeks ago treated by PCP.  History of stroke and TAVR, history of atrial fibrillation on warfarin.  Patient denies any headache shortness of breath chest pain abdominal pain nausea vomiting diarrhea or urinary frequency.  No recent falls but has felt unsteady on her feet.  No change in medications other than recent amoxicillin for possible UTI.  No fevers or chills.  The history is provided by the patient, the spouse and the EMS personnel.  Weakness Severity:  Moderate Onset quality:  Gradual Duration:  3 days Timing:  Constant Progression:  Unchanged Chronicity:  New Context: urinary tract infection   Relieved by:  Nothing Worsened by:  Activity Ineffective treatments:  Rest Associated symptoms: difficulty walking   Associated symptoms: no abdominal pain, no chest pain, no diarrhea, no dysuria, no falls, no fever, no headaches, no hematochezia, no melena, no nausea, no shortness of breath and no vomiting   Risk factors: diabetes, neurologic disease and new medications        Home Medications Prior to Admission medications   Medication Sig Start Date End Date Taking? Authorizing Provider  alendronate (FOSAMAX) 70 MG tablet Take 70 mg by mouth once a week.    [provider]  amoxicillin (AMOXIL) 500 MG tablet Take 4 tablets (2,000 mg total) by mouth as directed. 1 hour prior to dental work including cleanings 06/10/21   Janetta Hora, PA-C  Biotin 5000 MCG CAPS Take 5,000 mcg by mouth daily.    [provider]  calcium carbonate (OSCAL) 1500 (600 Ca) MG  TABS tablet Take 600 mg of elemental calcium by mouth daily.    [provider]  cholecalciferol (VITAMIN D) 1000 units tablet Take 1,000 Units by mouth at bedtime.    [provider]  Co-Enzyme Q-10 100 MG CAPS Take 100 mg by mouth at bedtime.    [provider]  ferrous sulfate 325 (65 FE) MG tablet Take 325 mg by mouth daily with breakfast.    [provider]  lisinopril (PRINIVIL,ZESTRIL) 10 MG tablet Take 10 mg by mouth at bedtime.    [provider]  loratadine (CLARITIN) 10 MG tablet Take 10 mg by mouth at bedtime.    [provider]  metFORMIN (GLUCOPHAGE) 500 MG tablet Take 500 mg by mouth 2 (two) times daily with a meal.     [provider]  metoprolol succinate (TOPROL-XL) 25 MG 24 hr tablet Take 25 mg by mouth at bedtime.    [provider]  Multiple Vitamins-Minerals (CENTRUM SILVER) tablet Take 1 tablet by mouth at bedtime.     [provider]  simvastatin (ZOCOR) 40 MG tablet Take 40 mg by mouth at bedtime.      [provider]  warfarin (COUMADIN) 2 MG tablet Take 2 mg by mouth daily. Pt takes 1 tablet 2 mg daily.    [provider]      Allergies    Cat hair extract    Review of Systems   Review  of Systems  Constitutional:  Positive for fatigue. Negative for fever.  Eyes:  Negative for visual disturbance.  Respiratory:  Negative for shortness of breath.   Cardiovascular:  Negative for chest pain.  Gastrointestinal:  Negative for abdominal pain, diarrhea, hematochezia, melena, nausea and vomiting.  Genitourinary:  Negative for dysuria.  Musculoskeletal:  Positive for gait problem. Negative for falls.  Neurological:  Positive for weakness. Negative for headaches.    Physical Exam Updated Vital Signs BP 116/65   Pulse 90   Temp 98.7 F (37.1 C) (Oral)   Resp (!) 23   Ht 5' (1.524 m)   Wt 69.6 kg   SpO2 95%   BMI 29.97 kg/m  Physical Exam Vitals and nursing note  reviewed.  Constitutional:      General: She is not in acute distress.    Appearance: Normal appearance. She is well-developed.  HENT:     Head: Normocephalic and atraumatic.  Eyes:     Conjunctiva/sclera: Conjunctivae normal.  Cardiovascular:     Rate and Rhythm: Tachycardia present. Rhythm irregular.     Pulses: Normal pulses.     Heart sounds: No murmur heard. Pulmonary:     Effort: Pulmonary effort is normal. No respiratory distress.     Breath sounds: Normal breath sounds.  Abdominal:     Palpations: Abdomen is soft.     Tenderness: There is no abdominal tenderness. There is no guarding or rebound.  Musculoskeletal:        General: No deformity. Normal range of motion.     Cervical back: Neck supple.     Right lower leg: No edema.     Left lower leg: No edema.  Skin:    General: Skin is warm and dry.     Capillary Refill: Capillary refill takes less than 2 seconds.  Neurological:     General: No focal deficit present.     Mental Status: She is alert.     Cranial Nerves: No cranial nerve deficit.     Sensory: No sensory deficit.     Motor: No weakness.     ED Results / Procedures / Treatments   Labs (all labs ordered are listed, but only abnormal results are displayed) Labs Reviewed  BASIC METABOLIC PANEL - Abnormal; Notable for the following components:      Result Value   Glucose, Bld 116 (*)    All other components within normal limits  CBC WITH DIFFERENTIAL/PLATELET - Abnormal; Notable for the following components:   WBC 14.3 (*)    Platelets 142 (*)    Neutro Abs 10.7 (*)    Monocytes Absolute 1.5 (*)    All other components within normal limits  URINALYSIS, ROUTINE W REFLEX MICROSCOPIC - Abnormal; Notable for the following components:   APPearance CLOUDY (*)    Hgb urine dipstick MODERATE (*)    Ketones, ur 20 (*)    Protein, ur 30 (*)    Nitrite POSITIVE (*)    Leukocytes,Ua LARGE (*)    WBC, UA >50 (*)    Bacteria, UA MANY (*)    All other  components within normal limits  MAGNESIUM - Abnormal; Notable for the following components:   Magnesium 1.6 (*)    All other components within normal limits  PROTIME-INR - Abnormal; Notable for the following components:   Prothrombin Time 26.2 (*)    INR 2.4 (*)    All other components within normal limits  BRAIN NATRIURETIC PEPTIDE - Abnormal; Notable for the  following components:   B Natriuretic Peptide 188.0 (*)    All other components within normal limits  URINE CULTURE  TROPONIN I (HIGH SENSITIVITY)  TROPONIN I (HIGH SENSITIVITY)    EKG EKG Interpretation  Date/Time:  Friday August 19 2021 17:14:04 EDT Ventricular Rate:  134 PR Interval:    QRS Duration: 116 QT Interval:  328 QTC Calculation: 497 R Axis:   -74 Text Interpretation: Atrial fibrillation Paired ventricular premature complexes RBBB and LAFB Low voltage, precordial leads Baseline wander in lead(s) II III aVF increased rate and ectopy from prior 4/23 Confirmed by [place] Score [phone]) on 08/19/2021 5:23:11 PM  Radiology DG Chest Port 1 View  Result Date: 08/19/2021 CLINICAL DATA:  Weakness EXAM: PORTABLE CHEST 1 VIEW COMPARISON:  05/27/2021 FINDINGS: No acute airspace disease. Minimal atelectasis or scarring at the left base. Valve prosthesis. Borderline to mild cardiomegaly with aortic atherosclerosis. No pneumothorax. IMPRESSION: 1. Interval TAVR. There is mild cardiomegaly without overt edema or pleural effusion 2. Streaky atelectasis or scarring at left base Electronically Signed   By: Jasmine Pang M.D.   On: 08/19/2021 17:40    Procedures Procedures    Medications Ordered in ED Medications  sodium chloride 0.9 % bolus 500 mL (0 mLs Intravenous Stopped 08/19/21 1917)  magnesium sulfate IVPB 2 g 50 mL (0 g Intravenous Stopped 08/19/21 1917)  cefTRIAXone (ROCEPHIN) 1 g in sodium chloride 0.9 % 100 mL IVPB (0 g Intravenous Stopped 08/19/21 2140)    ED Course/ Medical Decision Making/ A&P Clinical Course as  of 08/20/21 1033  Fri Aug 19, 2021  1744 Chest x-ray interpreted by me as no gross fluid overload or infiltrates.  Awaiting radiology reading. [MB]    Clinical Course User Index [MB] Terrilee Files, MD                           Medical Decision Making Amount and/or Complexity of Data Reviewed Labs: ordered. Radiology: ordered.  Risk Prescription drug management.  This patient complains of weak and unsteady, rapid A-fib, possible UTI; this involves an extensive number of treatment Options and is a complaint that carries with it a high risk of complications and morbidity. The differential includes A-fib with rapid ventricular response, dehydration, sepsis, Sirs, UTI, metabolic derangement, anemia  I ordered, reviewed and interpreted labs, which included CBC elevated white count stable hemoglobin, chemistries normal, magnesium mildly low urinalysis with signs of infection nitrite positive, troponins flat, BNP mildly elevated, INR therapeutic I ordered medication IV fluids IV magnesium IV antibiotics and reviewed PMP when indicated. I ordered imaging studies which included chest x-ray and I independently    visualized and interpreted imaging which showed mild cardiomegaly no gross infiltrates Additional history obtained from patient's husband Previous records obtained and reviewed in epic including PCP visit today Cardiac monitoring reviewed, A-fib with RVR improved down to heart rate mostly in the 90s Social determinants considered, no significant barriers Critical Interventions: None  After the interventions stated above, I reevaluated the patient and found patient to be symptomatically improved Admission and further testing considered, no indications for admission at this time.  Will cover with oral antibiotics after her IV dose and a urine culture sent.  Patient comfortable continue to hydrate at home.  Return instructions discussed.          Final Clinical Impression(s) /  ED Diagnoses Final diagnoses:  General weakness  Acute lower UTI  Atrial fibrillation with RVR (HCC)  Rx / DC Orders ED Discharge Orders          Ordered    cephALEXin (KEFLEX) 500 MG capsule  4 times daily        08/19/21 2215              Terrilee Files, MD 08/20/21 1036

## 2021-08-19 NOTE — ED Triage Notes (Signed)
Patient is being discharged from the Urgent Care and sent to the Emergency Department via ems . Per Derry Lory NP , patient is in need of higher level of care due to abnormal EKG . Patient is aware and verbalizes understanding of plan of care.  Vitals:   08/19/21 1557  BP: 140/73  Pulse: (!) 124  Resp: 20  Temp: 98.7 F (37.1 C)  SpO2: 94%

## 2021-08-19 NOTE — ED Triage Notes (Signed)
Pt reports feeling tired, lower abdominal pain x 3 days. Denies chest pain, dizziness, vision changes, nausea, vomiting. Pt reports she had heart surgery and a stroke in April 2023.

## 2021-08-19 NOTE — ED Triage Notes (Addendum)
Pt to the ED referred by UC with uncontrolled Afib with RVR and HR as high as the 140's.

## 2021-08-19 NOTE — Discharge Instructions (Addendum)
You were seen in the emergency department for malaise and elevated heart rate.  Your atrial fibrillation was going fast when you got here but improved with some fluids and rest.  Your urine still looks like it is infected and you received an IV dose of antibiotics.  We are switching her antibiotics to Keflex.  Please pick up your prescription tomorrow.  A urine culture was also sent and we may need to change her antibiotics if this gives [place] more information.  Please contact Dr. Willey Blade for close follow-up.  Return if any worsening or concerning symptoms.

## 2021-08-19 NOTE — ED Provider Notes (Signed)
[facility]    CSN: [id number] Arrival date & time: 08/19/21  1540      History   Chief Complaint Chief Complaint  Patient presents with   Abdominal Pain   Fatigue         HPI Anne Wolf is a 88 y.o. female.   Patient presents with 3 days of weakness, abdominal pain, and fatigue.  Reports a couple of weeks ago, she was treated for UTI at her primary care provider's office.  She is unsure if the treatment helped.  Reports she has a history of a stroke and recently had a TAVR a couple of months ago.  She has a history of atrial fibrillation, however is not on rate control medication currently.  Husband is also present in the exam room and endorses some confusion/forgetfulness for the past couple of days.  The patient denies chest pain, palpitations, shortness of breath.    Past Medical History:  Diagnosis Date   Aortic stenosis    mild; mild MR; slightly increased pulmonary artery pressure with mild RVH; normal LV-2010   Arthritis    Atrial fibrillation (HCC)    chronic anticoagulation; adequate HR control on minimal AV nodal blocking medication    Cancer ([place])    skin cancers removed from face   CHF (congestive heart failure) ([place])    Diabetes mellitus without complication ([place])    2   Dyspnea    Fasting hyperglycemia    + microalbuminuria; A1c of 6.5% in 11/2010   Hematochezia 01/2009   01/2009-presumed ischemic colitis; history of diverticulosis   History of kidney stones    Hx of adenomatous colonic polyps 2005   adenomatous polyp; negative colonoscopy in 2009   Hyperlipidemia    Stroke [facility])     Patient Active Problem List   Diagnosis Date Noted   UTI (urinary tract infection) 05/31/2021   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   Transient ischemic attack (TIA) 02/18/2021   Essential hypertension 02/18/2021   Type 2 diabetes mellitus ([place]) 02/18/2021   Acute venous embolism and thrombosis of deep vessels of  proximal lower extremity ([place]) 10/28/2019   History of diagnostic tests 08/07/2012   Fasting hyperglycemia    Aortic stenosis    Hx of adenomatous colonic polyps    Long term (current) use of anticoagulants 05/31/2010   Mixed hyperlipidemia 02/08/2009   Hematochezia 01/20/2009   Atrial fibrillation, chronic ([place]) 09/09/2008    Past Surgical History:  Procedure Laterality Date   CATARACT EXTRACTION W/PHACO Right 03/23/2014   Procedure: CATARACT EXTRACTION PHACO AND INTRAOCULAR LENS PLACEMENT (IOC);  Surgeon: Williams Che, MD;  Location: [facility];  Service: Ophthalmology;  Laterality: Right;  CDE:2.83   CATARACT EXTRACTION W/PHACO Left 07/27/2014   Procedure: CATARACT EXTRACTION PHACO AND INTRAOCULAR LENS PLACEMENT LEFT EYE CDE=6.97;  Surgeon: Williams Che, MD;  Location: [facility];  Service: Ophthalmology;  Laterality: Left;   COLONOSCOPY  2009   negative; prior study with adenomatous polyp   CYSTOSCOPY/URETEROSCOPY/HOLMIUM LASER/STENT PLACEMENT Right 08/21/2016   Procedure: CYSTOSCOPY/URETEROSCOPY/RETROGRADE PYELOGRAM//STENT PLACEMENT;  Surgeon: Nickie Retort, MD;  Location: [facility];  Service: Urology;  Laterality: Right;   EYE SURGERY     INTRAOPERATIVE TRANSTHORACIC ECHOCARDIOGRAM N/A 05/31/2021   Procedure: INTRAOPERATIVE TRANSTHORACIC ECHOCARDIOGRAM;  Surgeon: Sherren Mocha, MD;  Location: [facility];  Service: Open Heart Surgery;  Laterality: N/A;   RIGHT/LEFT HEART CATH AND CORONARY ANGIOGRAPHY N/A 02/04/2021   Procedure: RIGHT/LEFT HEART CATH AND CORONARY ANGIOGRAPHY;  Surgeon:  Sherren Mocha, MD;  Location: [facility];  Service: Cardiovascular;  Laterality: N/A;   TRANSCATHETER AORTIC VALVE REPLACEMENT, TRANSFEMORAL N/A 05/31/2021   Procedure: Transcatheter Aortic Valve Replacement 68m, Transfemoral;  Surgeon: CSherren Mocha MD;  Location[place] [facility]  Service: Open Heart Surgery;  Laterality: N/A;  Percutaneous    OB History   No obstetric history on file.      Home  Medications    Prior to Admission medications   Medication Sig Start Date End Date Taking? Authorizing Provider  alendronate (FOSAMAX) 70 MG tablet Take 70 mg by mouth once a week.    [provider]  amoxicillin (AMOXIL) 500 MG tablet Take 4 tablets (2,000 mg total) by mouth as directed. 1 hour prior to dental work including cleanings 06/10/21   TEileen Stanford PA-C  Biotin 5000 MCG CAPS Take 5,000 mcg by mouth daily.    [provider]  calcium carbonate (OSCAL) 1500 (600 Ca) MG TABS tablet Take 600 mg of elemental calcium by mouth daily.    [provider]  cholecalciferol (VITAMIN D) 1000 units tablet Take 1,000 Units by mouth at bedtime.    [provider]  Co-Enzyme Q-10 100 MG CAPS Take 100 mg by mouth at bedtime.    [provider]  ferrous sulfate 325 (65 FE) MG tablet Take 325 mg by mouth daily with breakfast.    [provider]  lisinopril (PRINIVIL,ZESTRIL) 10 MG tablet Take 10 mg by mouth at bedtime.    [provider]  loratadine (CLARITIN) 10 MG tablet Take 10 mg by mouth at bedtime.    [provider]  metFORMIN (GLUCOPHAGE) 500 MG tablet Take 500 mg by mouth 2 (two) times daily with a meal.     [provider]  metoprolol succinate (TOPROL-XL) 25 MG 24 hr tablet Take 25 mg by mouth at bedtime.    [provider]  Multiple Vitamins-Minerals (CENTRUM SILVER) tablet Take 1 tablet by mouth at bedtime.     [provider]  simvastatin (ZOCOR) 40 MG tablet Take 40 mg by mouth at bedtime.      [provider]  warfarin (COUMADIN) 2 MG tablet Take 2 mg by mouth daily. Pt takes 1 tablet 2 mg daily.    [provider]    Family History Family History  Problem Relation Age of Onset   Cancer Father    Arrhythmia Sister        Atrial fibrillation    Social History Social History   Tobacco Use   Smoking status: Never   Smokeless tobacco: Never  Vaping Use    Vaping Use: Never used  Substance Use Topics   Alcohol use: No   Drug use: No     Allergies   Cat hair extract   Review of Systems Review of Systems Per HPI  Physical Exam Triage Vital Signs ED Triage Vitals  Enc Vitals Group     BP 08/19/21 1557 140/73     Pulse Rate 08/19/21 1557 (!) 124     Resp 08/19/21 1557 20     Temp 08/19/21 1557 98.7 F (37.1 C)     Temp Source 08/19/21 1557 Oral     SpO2 08/19/21 1557 94 %     Weight --      Height --      Head Circumference --      Peak Flow --      Pain Score 08/19/21 1554 5  Pain Loc --      Pain Edu? --      Excl. in [place]? --    No data found.  Updated Vital Signs BP 140/73 (BP Location: Right Arm)   Pulse (!) 124   Temp 98.7 F (37.1 C) (Oral)   Resp 20   SpO2 94%   Visual Acuity Right Eye Distance:   Left Eye Distance:   Bilateral Distance:    Right Eye Near:   Left Eye Near:    Bilateral Near:     Physical Exam   UC Treatments / Results  Labs (all labs ordered are listed, but only abnormal results are displayed) Labs Reviewed  POCT URINALYSIS DIP (MANUAL ENTRY) - Abnormal; Notable for the following components:      Result Value   Clarity, UA cloudy (*)    Blood, UA large (*)    Protein Ur, POC trace (*)    Nitrite, UA Positive (*)    Leukocytes, UA Large (3+) (*)    All other components within normal limits    EKG   Radiology No results found.  Procedures Procedures (including critical care time)  Medications Ordered in UC Medications - No data to display  Initial Impression / Assessment and Plan / UC Course  I have reviewed the triage vital signs and the nursing notes.  Pertinent labs & imaging results that were available during my care of the patient were reviewed by me and considered in my medical decision making (see chart for details).    Patient is a very pleasant 88 year old female.  In the examination room, she is sitting with her legs propped up and appears ill.  EKG  today shows A-fib with RVR; she is not on rate controlling medication currently.  Given symptoms and risk factors, I recommended evaluation in the emergency room for further evaluation and management.  It does appear that she has a urinary tract infection today, however I will defer this to the emergency room to treat.  Patient to be transported by EMS directly to the emergency room for further evaluation and management. Final Clinical Impressions(s) / UC Diagnoses   Final diagnoses:  Atrial fibrillation with RVR ([place])  Weakness  Acute UTI     Discharge Instructions      Please go directly to the emergency room for further evaluation of your symptoms     ED Prescriptions   None    PDMP not reviewed this encounter.   Eulogio Bear, NP 08/19/21 1651

## 2021-08-22 LAB — URINE CULTURE: Culture: >=100000 — AB

## 2021-08-23 ENCOUNTER — Telehealth: Payer: Self-pay | Admitting: Emergency Medicine

## 2021-08-23 NOTE — Telephone Encounter (Signed)
Post ED Visit - Positive Culture Follow-up  Culture report reviewed by antimicrobial stewardship pharmacist: [place] Team '[]'$  Elenor Quinones, Pharm.D. '[]'$  Heide Guile, Pharm.D., BCPS AQ-ID '[]'$  Parks Neptune, Pharm.D., BCPS '[]'$  Alycia Rossetti, Pharm.D., BCPS '[]'$  Shickley, Pharm.D., BCPS, AAHIVP '[]'$  Legrand Como, Pharm.D., BCPS, AAHIVP '[]'$  Salome Arnt, PharmD, BCPS '[]'$  Johnnette Gourd, PharmD, BCPS '[]'$  Hughes Better, PharmD, BCPS '[]'$  Leeroy Cha, PharmD '[]'$  Laqueta Linden, PharmD, BCPS '[]'$  Albertina Parr, PharmD  [place] Team '[]'$  Leodis Sias, PharmD '[]'$  Lindell Spar, PharmD '[]'$  Royetta Asal, PharmD '[]'$  Graylin Shiver, Rph '[]'$  Rema Fendt) Glennon Mac, PharmD '[]'$  Arlyn Dunning, PharmD '[]'$  Netta Cedars, PharmD '[]'$  Dia Sitter, PharmD '[]'$  Leone Haven, PharmD '[]'$  Gretta Arab, PharmD '[]'$  Theodis Shove, PharmD '[]'$  Peggyann Juba, PharmD '[]'$  Reuel Boom, PharmD   Positive urine culture Treated with cephalexin, organism sensitive to the same and no further patient follow-up is required at this time.  Hazle Nordmann 08/23/2021, 10:29 AM

## 2021-09-05 ENCOUNTER — Ambulatory Visit (INDEPENDENT_AMBULATORY_CARE_PROVIDER_SITE_OTHER): Payer: Medicare HMO | Admitting: *Deleted

## 2021-09-05 DIAGNOSIS — I4819 Other persistent atrial fibrillation: Secondary | ICD-10-CM

## 2021-09-05 DIAGNOSIS — Z5181 Encounter for therapeutic drug level monitoring: Secondary | ICD-10-CM

## 2021-09-05 LAB — POCT INR: INR: 2.9 (ref 2.0–3.0)

## 2021-09-05 NOTE — Patient Instructions (Signed)
S/P TAVR on 05/31/21.  Continue warfarin 1 tablet daily except 1/2 tablet on Sundays and Wednesday Recheck in 4 wks

## 2021-09-12 DIAGNOSIS — Z85828 Personal history of other malignant neoplasm of skin: Secondary | ICD-10-CM | POA: Diagnosis not present

## 2021-09-12 DIAGNOSIS — Z1283 Encounter for screening for malignant neoplasm of skin: Secondary | ICD-10-CM | POA: Diagnosis not present

## 2021-09-12 DIAGNOSIS — L570 Actinic keratosis: Secondary | ICD-10-CM | POA: Diagnosis not present

## 2021-10-03 ENCOUNTER — Ambulatory Visit (INDEPENDENT_AMBULATORY_CARE_PROVIDER_SITE_OTHER): Payer: Medicare HMO | Admitting: *Deleted

## 2021-10-03 DIAGNOSIS — I4819 Other persistent atrial fibrillation: Secondary | ICD-10-CM

## 2021-10-03 DIAGNOSIS — I482 Chronic atrial fibrillation, unspecified: Secondary | ICD-10-CM

## 2021-10-03 DIAGNOSIS — Z7901 Long term (current) use of anticoagulants: Secondary | ICD-10-CM | POA: Diagnosis not present

## 2021-10-03 LAB — POCT INR: INR: 2.2 (ref 2.0–3.0)

## 2021-10-03 NOTE — Patient Instructions (Signed)
S/P TAVR on 05/31/21.  Continue warfarin 1 tablet daily except 1/2 tablet on Sundays and Wednesday Recheck in 4 wks

## 2021-10-21 ENCOUNTER — Encounter (HOSPITAL_COMMUNITY): Payer: Self-pay | Admitting: Emergency Medicine

## 2021-10-21 ENCOUNTER — Emergency Department (HOSPITAL_COMMUNITY)
Admission: EM | Admit: 2021-10-21 | Discharge: 2021-10-21 | Disposition: A | Discharge status: Home / Self Care | Payer: Medicare HMO | Attending: Emergency Medicine | Admitting: Emergency Medicine

## 2021-10-21 ENCOUNTER — Emergency Department (HOSPITAL_COMMUNITY): Payer: Medicare HMO

## 2021-10-21 ENCOUNTER — Other Ambulatory Visit: Payer: Self-pay

## 2021-10-21 DIAGNOSIS — N3000 Acute cystitis without hematuria: Secondary | ICD-10-CM | POA: Diagnosis not present

## 2021-10-21 DIAGNOSIS — Z87898 Personal history of other specified conditions: Secondary | ICD-10-CM | POA: Insufficient documentation

## 2021-10-21 DIAGNOSIS — I110 Hypertensive heart disease with heart failure: Secondary | ICD-10-CM | POA: Diagnosis not present

## 2021-10-21 DIAGNOSIS — I509 Heart failure, unspecified: Secondary | ICD-10-CM | POA: Insufficient documentation

## 2021-10-21 DIAGNOSIS — E119 Type 2 diabetes mellitus without complications: Secondary | ICD-10-CM | POA: Insufficient documentation

## 2021-10-21 DIAGNOSIS — Z7984 Long term (current) use of oral hypoglycemic drugs: Secondary | ICD-10-CM | POA: Diagnosis not present

## 2021-10-21 DIAGNOSIS — Z87442 Personal history of urinary calculi: Secondary | ICD-10-CM | POA: Insufficient documentation

## 2021-10-21 DIAGNOSIS — Z85828 Personal history of other malignant neoplasm of skin: Secondary | ICD-10-CM | POA: Insufficient documentation

## 2021-10-21 DIAGNOSIS — I6381 Other cerebral infarction due to occlusion or stenosis of small artery: Secondary | ICD-10-CM | POA: Diagnosis not present

## 2021-10-21 DIAGNOSIS — Z79899 Other long term (current) drug therapy: Secondary | ICD-10-CM | POA: Insufficient documentation

## 2021-10-21 DIAGNOSIS — R42 Dizziness and giddiness: Secondary | ICD-10-CM | POA: Insufficient documentation

## 2021-10-21 LAB — URINALYSIS, ROUTINE W REFLEX MICROSCOPIC
Bilirubin Urine: NEGATIVE
Glucose, UA: NEGATIVE mg/dL
Hgb urine dipstick: NEGATIVE
Ketones, ur: NEGATIVE mg/dL
Nitrite: POSITIVE — AB
Protein, ur: 30 mg/dL — AB
Specific Gravity, Urine: 1.017 (ref 1.005–1.030)
WBC, UA: >50 WBC/hpf — ABNORMAL HIGH (ref 0–5)
pH: 5 (ref 5.0–8.0)

## 2021-10-21 LAB — BASIC METABOLIC PANEL
Anion gap: 7 (ref 5–15)
BUN: 16 mg/dL (ref 8–23)
CO2: 28 mmol/L (ref 22–32)
Calcium: 9.4 mg/dL (ref 8.9–10.3)
Chloride: 105 mmol/L (ref 98–111)
Creatinine, Ser: 0.75 mg/dL (ref 0.44–1.00)
GFR, Estimated: >60 mL/min (ref >60)
Glucose, Bld: 122 mg/dL — ABNORMAL HIGH (ref 70–99)
Potassium: 4 mmol/L (ref 3.5–5.1)
Sodium: 140 mmol/L (ref 135–145)

## 2021-10-21 LAB — CBC
HCT: 40.8 % (ref 36.0–46.0)
Hemoglobin: 13.3 g/dL (ref 12.0–15.0)
MCH: 30.2 pg (ref 26.0–34.0)
MCHC: 32.6 g/dL (ref 30.0–36.0)
MCV: 92.7 fL (ref 80.0–100.0)
Platelets: 160 10*3/uL (ref 150–400)
RBC: 4.4 MIL/uL (ref 3.87–5.11)
RDW: 14 % (ref 11.5–15.5)
WBC: 8 10*3/uL (ref 4.0–10.5)
nRBC: 0 % (ref 0.0–0.2)

## 2021-10-21 MED ORDER — CEFDINIR 300 MG PO CAPS
300.0000 mg | ORAL_CAPSULE | Freq: Once | ORAL | Status: AC
Start: 2021-10-21 — End: 2021-10-21
  Administered 2021-10-21: 300 mg via ORAL
  Filled 2021-10-21: qty 1

## 2021-10-21 MED ORDER — CEFDINIR 300 MG PO CAPS: 300.0000 mg | ORAL_CAPSULE | Freq: Two times a day (BID) | ORAL | 0 refills | Status: AC

## 2021-10-21 NOTE — ED Provider Triage Note (Signed)
Emergency Medicine Provider Triage Evaluation Note  Anne Wolf , a 88 y.o. female  was evaluated in triage.  Pt complains of dizziness starting at 12pm. Pt states she was watching tennis and stood up and felt like she was going to fall. Hx of TIA in December of last year without residual deficits. States dizziness has improved greatly since it came on.   Review of Systems  Positive: dizziness Negative: Weakness, numbness, syncope, CP, headache  Physical Exam  BP 136/64   Pulse 85   Temp 98.1 F (36.7 C) (Oral)   Resp 18   Ht 5' (1.524 m)   Wt 69.6 kg   SpO2 95%   BMI 29.97 kg/m  Gen:   Awake, no distress   Resp:  Normal effort  MSK:   Moves extremities without difficulty  Other:  CN exam grossly normal, 5/5 strength and sensation in all extremities, no slurred speech or facial droop  Medical Decision Making  Medically screening exam initiated at 1:30 PM.  Appropriate orders placed.  Anne Wolf was informed that the remainder of the evaluation will be completed by another provider, this initial triage assessment does not replace that evaluation, and the importance of remaining in the ED until their evaluation is complete.     Rosser Collington T, PA-C 10/21/21 1332

## 2021-10-21 NOTE — Discharge Instructions (Addendum)
We evaluated you for your symptom of feeling weak and like you were going to fall.  Your neurologic exam was reassuring.  Your CT head was negative.  At this time, does not look like you are having a stroke.  Sometimes unsteadiness could be related to the inner ear.  We also diagnosed you with a urinary infection.  This can also cause a sensation of feeling weak and like you are going to fall.  Please take your antibiotics as prescribed.  Your symptoms have resolved in the emergency department.  If you have any recurrence of your symptoms, please return to the emergency department so we can reevaluate you.  Also return if you develop any fevers, severe pain, vomiting, headaches, or any other changes.

## 2021-10-21 NOTE — ED Notes (Signed)
See triage notes. Pt a/o. Nad. NIH negative. Steady gait. States had dizziness that lasted approx 45 min today, denied any weakness on one side more than the other, denied trouble speaking or swallowing.

## 2021-10-21 NOTE — ED Triage Notes (Signed)
Pt states this morning has been feeling dizzy and lightheaded and feels like she might pass out. Pt walks over 1 mile a day. No complains of dizziness yesterday. Pt states her head does not feel right.

## 2021-10-21 NOTE — ED Triage Notes (Signed)
Pt states she has had a stroke in the past. Pt states dizzy started about 45 mins ago after standing up from chair.

## 2021-10-31 ENCOUNTER — Ambulatory Visit: Payer: Medicare HMO | Attending: Cardiology | Admitting: *Deleted

## 2021-10-31 DIAGNOSIS — I4819 Other persistent atrial fibrillation: Secondary | ICD-10-CM

## 2021-10-31 DIAGNOSIS — Z5181 Encounter for therapeutic drug level monitoring: Secondary | ICD-10-CM | POA: Diagnosis not present

## 2021-10-31 LAB — POCT INR: INR: 2.1 (ref 2.0–3.0)

## 2021-10-31 NOTE — Patient Instructions (Signed)
S/P TAVR on 05/31/21.  Continue warfarin 1 tablet daily except 1/2 tablet on Sundays and Wednesday Recheck in 4 wks

## 2021-11-04 ENCOUNTER — Ambulatory Visit: Payer: Medicare HMO | Attending: Cardiology | Admitting: Cardiology

## 2021-11-04 ENCOUNTER — Encounter: Payer: Self-pay | Admitting: *Deleted

## 2021-11-04 ENCOUNTER — Encounter: Payer: Self-pay | Admitting: Cardiology

## 2021-11-04 VITALS — BP 130/84 | HR 67 | Ht 60.0 in | Wt 153.2 lb

## 2021-11-04 DIAGNOSIS — Z952 Presence of prosthetic heart valve: Secondary | ICD-10-CM | POA: Diagnosis not present

## 2021-11-04 DIAGNOSIS — D6869 Other thrombophilia: Secondary | ICD-10-CM | POA: Diagnosis not present

## 2021-11-04 DIAGNOSIS — I482 Chronic atrial fibrillation, unspecified: Secondary | ICD-10-CM

## 2021-11-04 NOTE — Progress Notes (Signed)
Clinical Summary Anne Wolf is a 88 y.o.female seen today for follow up of the following medical problems.    1. Afib - no recent palpitations - compliant with meds.    - no recent palpitations. Compliant with meds.  - isolated nose bleed, no recurrent issues. Remains on coumadin. Was seen by ENT and had cautery - has some interested in NOACs      - no recent palpitations - compliant with meds - no bleeding on coumadin    2. Hyperlipidemia - compliant with meds - 01/2021 TC 152 TG 69 HDL 52 LDL 86   3. Aortic stenosis -  underwent successful TAVR with a 23 mm Edwards Sapien 3UR THV via the TF approach on 05/31/21.  06/2021 echo: LVEF 60-65%, normal AVR   4. 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 in setting of subtherapeutic INR at 1.3 -  had to be off coumadin recently for invasvive procedures/testing    Past Medical History:  Diagnosis Date   Aortic stenosis    mild; mild MR; slightly increased pulmonary artery pressure with mild RVH; normal LV-2010   Arthritis    Atrial fibrillation (HCC)    chronic anticoagulation; adequate HR control on minimal AV nodal blocking medication    Cancer ([place])    skin cancers removed from face   CHF (congestive heart failure) ([place])    Diabetes mellitus without complication ([place])    2   Dyspnea    Fasting hyperglycemia    + microalbuminuria; A1c of 6.5% in 11/2010   Hematochezia 01/2009   01/2009-presumed ischemic colitis; history of diverticulosis   History of kidney stones    Hx of adenomatous colonic polyps 2005   adenomatous polyp; negative colonoscopy in 2009   Hyperlipidemia    Stroke (HCC)      Allergies  Allergen Reactions   Cat Hair Extract Other (See Comments)    Red eyes and Sneezing Also allergic to Dogs     Current Outpatient Medications  Medication Sig Dispense Refill   alendronate (FOSAMAX) 70 MG tablet Take 70 mg by mouth once a week.      Biotin 5000 MCG CAPS Take 5,000 mcg by mouth daily.     calcium carbonate (OSCAL) 1500 (600 Ca) MG TABS tablet Take 600 mg of elemental calcium by mouth daily.     cholecalciferol (VITAMIN D) 1000 units tablet Take 1,000 Units by mouth at bedtime.     Co-Enzyme Q-10 100 MG CAPS Take 100 mg by mouth at bedtime.     ferrous sulfate 325 (65 FE) MG tablet Take 325 mg by mouth daily with breakfast.     lisinopril (PRINIVIL,ZESTRIL) 10 MG tablet Take 10 mg by mouth at bedtime.     loratadine (CLARITIN) 10 MG tablet Take 10 mg by mouth at bedtime.     metFORMIN (GLUCOPHAGE) 500 MG tablet Take 500 mg by mouth 2 (two) times daily with a meal.      metoprolol succinate (TOPROL-XL) 25 MG 24 hr tablet Take 25 mg by mouth at bedtime.     Multiple Vitamins-Minerals (CENTRUM SILVER) tablet Take 1 tablet by mouth at bedtime.      simvastatin (ZOCOR) 40 MG tablet Take 40 mg by mouth at bedtime.       warfarin (COUMADIN) 2 MG tablet Take 2 mg by mouth daily. Pt takes 1 tablet 2 mg daily.     No  current facility-administered medications for this visit.     Past Surgical History:  Procedure Laterality Date   CATARACT EXTRACTION W/PHACO Right 03/23/2014   Procedure: CATARACT EXTRACTION PHACO AND INTRAOCULAR LENS PLACEMENT (IOC);  Surgeon: Williams Che, MD;  Location: [facility];  Service: Ophthalmology;  Laterality: Right;  CDE:2.83   CATARACT EXTRACTION W/PHACO Left 07/27/2014   Procedure: CATARACT EXTRACTION PHACO AND INTRAOCULAR LENS PLACEMENT LEFT EYE CDE=6.97;  Surgeon: Williams Che, MD;  Location: [facility];  Service: Ophthalmology;  Laterality: Left;   COLONOSCOPY  2009   negative; prior study with adenomatous polyp   CYSTOSCOPY/URETEROSCOPY/HOLMIUM LASER/STENT PLACEMENT Right 08/21/2016   Procedure: CYSTOSCOPY/URETEROSCOPY/RETROGRADE PYELOGRAM//STENT PLACEMENT;  Surgeon: Nickie Retort, MD;  Location: [facility];  Service: Urology;  Laterality: Right;   EYE SURGERY     INTRAOPERATIVE TRANSTHORACIC  ECHOCARDIOGRAM N/A 05/31/2021   Procedure: INTRAOPERATIVE TRANSTHORACIC ECHOCARDIOGRAM;  Surgeon: Sherren Mocha, MD;  Location: [facility];  Service: Open Heart Surgery;  Laterality: N/A;   RIGHT/LEFT HEART CATH AND CORONARY ANGIOGRAPHY N/A 02/04/2021   Procedure: RIGHT/LEFT HEART CATH AND CORONARY ANGIOGRAPHY;  Surgeon: Sherren Mocha, MD;  Location: [facility];  Service: Cardiovascular;  Laterality: N/A;   TRANSCATHETER AORTIC VALVE REPLACEMENT, TRANSFEMORAL N/A 05/31/2021   Procedure: Transcatheter Aortic Valve Replacement 74m, Transfemoral;  Surgeon: CSherren Mocha MD;  Location: [facility]  Service: Open Heart Surgery;  Laterality: N/A;  Percutaneous     Allergies  Allergen Reactions   Cat Hair Extract Other (See Comments)    Red eyes and Sneezing Also allergic to Dogs      Family History  Problem Relation Age of Onset   Cancer Father    Arrhythmia Sister        Atrial fibrillation     Social History Anne Wolf reports that she has never smoked. She has never used smokeless tobacco. Ms. THodgkissreports no history of alcohol use.   Review of Systems CONSTITUTIONAL: No weight loss, fever, chills, weakness or fatigue.  HEENT: Eyes: No visual loss, blurred vision, double vision or yellow sclerae.No hearing loss, sneezing, congestion, runny nose or sore throat.  SKIN: No rash or itching.  CARDIOVASCULAR: per hpi RESPIRATORY: No shortness of breath, cough or sputum.  GASTROINTESTINAL: No anorexia, nausea, vomiting or diarrhea. No abdominal pain or blood.  GENITOURINARY: No burning on urination, no polyuria NEUROLOGICAL: No headache, dizziness, syncope, paralysis, ataxia, numbness or tingling in the extremities. No change in bowel or bladder control.  MUSCULOSKELETAL: No muscle, back pain, joint pain or stiffness.  LYMPHATICS: No enlarged nodes. No history of splenectomy.  PSYCHIATRIC: No history of depression or anxiety.  ENDOCRINOLOGIC: No reports of sweating, cold or  heat intolerance. No polyuria or polydipsia.  .Marland Kitchen  Physical Examination Today's Vitals   11/04/21 1357  BP: 130/84  Pulse: 67  SpO2: 100%  Weight: 153 lb 3.2 oz (69.5 kg)  Height: 5' (1.524 m)   Body mass index is 29.92 kg/m.  Gen: resting comfortably, no acute distress HEENT: no scleral icterus, pupils equal round and reactive, no palptable cervical adenopathy,  CV: irreg, no m/r/ gno jvd Resp: Clear to auscultation bilaterally GI: abdomen is soft, non-tender, non-distended, normal bowel sounds, no hepatosplenomegaly MSK: extremities are warm, no edema.  Skin: warm, no rash Neuro:  no focal deficits Psych: appropriate affect   Diagnostic Studies  10/2013 Echo Study Conclusions  - Left ventricle: The cavity size was normal. Wall thickness was   increased in a pattern of moderate LVH. Systolic function  was   normal. The estimated ejection fraction was in the range of 55%   to 60%. Wall motion was normal; there were no regional wall   motion abnormalities. Doppler parameters are consistent with   elevated ventricular end-diastolic filling pressure. - Aortic valve: Mildly calcified annulus. Trileaflet; mildly to   moderately calcified leaflets. Cusp separation was mildly   reduced. There was mild to moderate stenosis. There was mild   regurgitation. Mean gradient (S): 11 mm Hg. Peak gradient (S): 23   mm Hg. VTI ratio of LVOT to aortic valve: 0.4. Valve area (VTI):   1.24 cm^2. Valve area (Vmax): 1.32 cm^2. Planimetry valve area   1.8 square cm. - Mitral valve: Calcified annulus. There was mild regurgitation.   Valve area by pressure half-time: 2.2 cm^2. - Left atrium: The atrium was severely dilated. - Right atrium: The atrium was severely dilated. Central venous   pressure (est): 3 mm Hg. - Atrial septum: No defect or patent foramen ovale was identified. - Tricuspid valve: There was mild regurgitation. - Pulmonary arteries: PA peak pressure: 35 mm Hg (S). -  Pericardium, extracardiac: There was no pericardial effusion.  Impressions:  - Moderate LVH with LVEF 55-60%, indeterminate diastolic function   with evidence of increased LVEDP. Severe biatrial enlargement.   Moderate MAC with mild mitral regurgitation. Mild to moderate,   calcific aortic stenosis as outlined above with mild aortic   regurgitation. Mild tricuspid regurgitation with PASP 35 mmHg.   08/2016 echo Study Conclusions   - Left ventricle: The cavity size was normal. Wall thickness was   increased in a pattern of mild LVH. Systolic function was normal.   The estimated ejection fraction was in the range of 55% to 60%. - Aortic valve: AV is thickened, calcified with mildly restricted   motion Peak and mean gradients through the valve are 30 and 17 mm   Hg respectively. There was mild regurgitation. - Mitral valve: Calcified annulus. Mildly thickened leaflets .   There was mild regurgitation. - Left atrium: The atrium was mildly dilated. - Right atrium: The atrium was mildly dilated. - Pulmonary arteries: PA peak pressure: 49 mm Hg (S).     12/2020 echo   IMPRESSIONS     1. Left ventricular ejection fraction, by estimation, is 60 to 65%. The  left ventricle has normal function. The left ventricle has no regional  wall motion abnormalities. Left ventricular diastolic parameters are  indeterminate. Basal septal hypertrophy  noted.   2. Right ventricular systolic function is normal. The right ventricular  size is normal. There is mildly elevated pulmonary artery systolic  pressure.   3. Left atrial size was severely dilated.   4. Right atrial size was severely dilated.   5. Mitral Valve area by continuity equation 1.79 cm; this is in the  setting of aortic insuffiency. The mitral valve is degenerative. Mild  mitral valve regurgitation. Mild mitral stenosis. The mean mitral valve  gradient is 10.0 mmHg with average heart  rate of 96 bpm.   6. The aortic valve is  calcified. Aortic valve regurgitation is mild to  moderate. Moderate to severe aortic valve stenosis. Aortic valve area, by  VTI measures 0.95 cm. Aortic valve mean gradient measures 29.0 mmHg.  Aortic valve Vmax measures 3.46 m/s.  Normal LV stroke volume with with DVI 0.3   7. The inferior vena cava is dilated in size with >50% respiratory  variability, suggesting right atrial pressure of 8 mmHg.  12/2020 nuclear stress The study is normal. The study is low risk.   No ST deviation was noted. The ECG was negative for ischemia.   LV perfusion is normal.   Left ventricular function is normal. Nuclear stress EF: 71 %.   Low risk study with no ischemia and normal LVEF at 71%.     01/2021 RHC/LHC   There is moderate aortic valve stenosis.   1.  Patent coronary arteries with no significant stenoses.  Mild calcification noted. 2.  Moderate to severe aortic stenosis with mean transvalvular gradient 28 mmHg and calculated aortic valve area 1.1 cm 3.  Normal right heart hemodynamics   Recommend: CT angiography studies, valve team review, consideration of further treatment options might include commercial TAVR versus enrollment in the [facility] moderate aortic stenosis TAVR trial.   01/2021 echo IMPRESSIONS     1. See full echo done 12/27/20 regarding valvular heart disease.   2. Left ventricular ejection fraction, by estimation, is 60 to 65%. The  left ventricle has normal function. The left ventricle has no regional  wall motion abnormalities. There is moderate left ventricular hypertrophy.   3. Right ventricular systolic function is normal. The right ventricular  size is normal.   4. Left atrial size was moderately dilated.   5. No color or bubble study done.   6. Right atrial size was moderately dilated.   7. Limited echo known MS/MR no doppler or color flow done . The mitral  valve is degenerative. No evidence of mitral valve regurgitation. No  evidence of mitral stenosis.  Moderate mitral annular calcification.   8. Limited echo no doppler / color done known AS/AR . The aortic valve is  tricuspid. There is moderate calcification of the aortic valve. There is  moderate thickening of the aortic valve. Aortic valve regurgitation is not  visualized. No aortic stenosis  is present.   9. The inferior vena cava is dilated in size with >50% respiratory  variability, suggesting right atrial pressure of 8 mmHg.        2D Echo 10/14/2020: 1. Small LV cavity (normal by index) and systolic function. Estimated LVEF 61% by MOD. Normal RV size and systolic function.  2. Moderate-severe AS with averaged mean PG 105mHg (AVA 0.72cm^2) and mild-moderate AR. The SVI is 30 ml/m2, AVAI is 0.4 cm2/m2, DVI 0.21 all which suggest the posibility of low-gradient severe AS.  3. Mild MS with averaged mean PG 569mg (HR 85bpm) and mild MR. Mild-moderate TR. Estimated RVSP 4667m; RAP 49m75m Trace PR.  4. No prior studies for comparison.   06/2021 echo 1. Left ventricular ejection fraction, by estimation, is 60 to 65%. The  left ventricle has normal function. The left ventricle has no regional  wall motion abnormalities. There is moderate left ventricular hypertrophy  of the basal-septal segment. Left  ventricular diastolic function could not be evaluated.   2. Right ventricular systolic function is normal. The right ventricular  size is normal. There is mildly elevated pulmonary artery systolic  pressure.   3. Left atrial size was severely dilated.   4. Right atrial size was severely dilated.   5. The mitral valve is degenerative. Mild mitral valve regurgitation.  Mild to moderate mitral stenosis. The mean mitral valve gradient is 4.0  mmHg. Moderate to severe mitral annular calcification.   6. Tricuspid valve regurgitation is mild to moderate.   7. The aortic valve is normal in structure. Aortic valve regurgitation is  trivial. No aortic stenosis is present.  There is a 23 mm Sapien  prosthetic  (TAVR) valve present in the aortic position. Procedure Date: 05/31/21. Echo  findings are consistent with  normal structure and function of the aortic valve prosthesis. Aortic valve  area, by VTI measures 2.17 cm. Aortic valve mean gradient measures 7.2  mmHg. Aortic valve Vmax measures 1.80 m/s.   8. The inferior vena cava is normal in size with greater than 50%  respiratory variability, suggesting right atrial pressure of 3 mmHg.   Comparison(s): 06/01/21 EF 60-65%. Moderate MS 86mHg mean PG, 261mg peak  PG. AV 1281m mean PG, 31m72mPG. PA pressure 50mm40m   Assessment and Plan   1. Afib/acquired thrombophilia - prior CVA in setting of subtherapeutic INR,  had to be off coumadin for a period recently for invasive testing for her heart valve - prior nose bleeds on eliquis, she has not been interested in retrying.  - would plan for lovenox bridging going forward if needed to be off coumadin in the future.   - no recent symptoms, continue current meds   2. Aortic stenosis -s/p TAVR doing well, continue current meds     JonatArnoldo Lenis., F.A.C.C.

## 2021-11-04 NOTE — Patient Instructions (Signed)
Medication Instructions:  Continue all current medications.   Labwork: none  Testing/Procedures: none  Follow-Up: 6 months   Any Other Special Instructions Will Be Listed Below (If Applicable).   If you need a refill on your cardiac medications before your next appointment, please call your pharmacy.  

## 2021-11-07 DIAGNOSIS — N390 Urinary tract infection, site not specified: Secondary | ICD-10-CM | POA: Diagnosis not present

## 2021-11-17 NOTE — ED Provider Notes (Signed)
[facility] EMERGENCY DEPARTMENT Provider Note  CSN: [id number] Arrival date & time: 10/21/21 1242  Chief Complaint(s) Dizziness  HPI Anne Wolf is a 88 y.o. female resenting to the emergency department with dizziness.  Describes this as a lightheadedness.  No chest pain, numbness or tingling, weakness, headaches.  No abdominal pain, nausea, vomiting, diarrhea, chest pain, shortness of breath.  This occurred today, lasted around 45 minutes, subsequently improved.  No loss of consciousness   Past Medical History Past Medical History:  Diagnosis Date   Aortic stenosis    mild; mild MR; slightly increased pulmonary artery pressure with mild RVH; normal LV-2010   Arthritis    Atrial fibrillation (HCC)    chronic anticoagulation; adequate HR control on minimal AV nodal blocking medication    Cancer ([place])    skin cancers removed from face   CHF (congestive heart failure) ([place])    Diabetes mellitus without complication ([place])    2   Dyspnea    Fasting hyperglycemia    + microalbuminuria; A1c of 6.5% in 11/2010   Hematochezia 01/2009   01/2009-presumed ischemic colitis; history of diverticulosis   History of kidney stones    Hx of adenomatous colonic polyps 2005   adenomatous polyp; negative colonoscopy in 2009   Hyperlipidemia    Stroke [facility])    Patient Active Problem List   Diagnosis Date Noted   UTI (urinary tract infection) 05/31/2021   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   Transient ischemic attack (TIA) 02/18/2021   Essential hypertension 02/18/2021   Type 2 diabetes mellitus ([place]) 02/18/2021   Acute venous embolism and thrombosis of deep vessels of proximal lower extremity ([place]) 10/28/2019   History of diagnostic tests 08/07/2012   Fasting hyperglycemia    Aortic stenosis    Hx of adenomatous colonic polyps    Long term (current) use of anticoagulants 05/31/2010   Mixed hyperlipidemia 02/08/2009   Hematochezia 01/20/2009   Atrial  fibrillation, chronic ([place]) 09/09/2008   Home Medication(s) Prior to Admission medications   Medication Sig Start Date End Date Taking? Authorizing Provider  alendronate (FOSAMAX) 70 MG tablet Take 70 mg by mouth once a week.    [provider]  Biotin 5000 MCG CAPS Take 5,000 mcg by mouth daily.    [provider]  calcium carbonate (OSCAL) 1500 (600 Ca) MG TABS tablet Take 600 mg of elemental calcium by mouth daily.    [provider]  cholecalciferol (VITAMIN D) 1000 units tablet Take 1,000 Units by mouth at bedtime.    [provider]  Co-Enzyme Q-10 100 MG CAPS Take 100 mg by mouth at bedtime.    [provider]  ferrous sulfate 325 (65 FE) MG tablet Take 325 mg by mouth daily with breakfast.    [provider]  lisinopril (PRINIVIL,ZESTRIL) 10 MG tablet Take 10 mg by mouth at bedtime.    [provider]  loratadine (CLARITIN) 10 MG tablet Take 10 mg by mouth at bedtime.    [provider]  metFORMIN (GLUCOPHAGE) 500 MG tablet Take 500 mg by mouth 2 (two) times daily with a meal.     [provider]  metoprolol succinate (TOPROL-XL) 25 MG 24 hr tablet Take 25 mg by mouth at bedtime.    [provider]  Multiple Vitamins-Minerals (CENTRUM SILVER) tablet Take 1 tablet by mouth at bedtime.     [provider]  simvastatin (ZOCOR) 40 MG tablet Take 40 mg by mouth at bedtime.  [provider]  warfarin (COUMADIN) 2 MG tablet Take 2 mg by mouth daily. 1 tablet five days a weeks 0.5 tablet by mouth 2 days a week    [provider]                                                                                                                                    Past Surgical History Past Surgical History:  Procedure Laterality Date   CATARACT EXTRACTION W/PHACO Right 03/23/2014   Procedure: CATARACT EXTRACTION PHACO AND INTRAOCULAR LENS PLACEMENT ([place]);  Surgeon: Williams Che,  MD;  Location: [facility];  Service: Ophthalmology;  Laterality: Right;  CDE:2.83   CATARACT EXTRACTION W/PHACO Left 07/27/2014   Procedure: CATARACT EXTRACTION PHACO AND INTRAOCULAR LENS PLACEMENT LEFT EYE CDE=6.97;  Surgeon: Williams Che, MD;  Location: [facility];  Service: Ophthalmology;  Laterality: Left;   COLONOSCOPY  2009   negative; prior study with adenomatous polyp   CYSTOSCOPY/URETEROSCOPY/HOLMIUM LASER/STENT PLACEMENT Right 08/21/2016   Procedure: CYSTOSCOPY/URETEROSCOPY/RETROGRADE PYELOGRAM//STENT PLACEMENT;  Surgeon: Nickie Retort, MD;  Location: [facility];  Service: Urology;  Laterality: Right;   EYE SURGERY     INTRAOPERATIVE TRANSTHORACIC ECHOCARDIOGRAM N/A 05/31/2021   Procedure: INTRAOPERATIVE TRANSTHORACIC ECHOCARDIOGRAM;  Surgeon: Sherren Mocha, MD;  Location: [place];  Service: Open Heart Surgery;  Laterality: N/A;   RIGHT/LEFT HEART CATH AND CORONARY ANGIOGRAPHY N/A 02/04/2021   Procedure: RIGHT/LEFT HEART CATH AND CORONARY ANGIOGRAPHY;  Surgeon: Sherren Mocha, MD;  Location: [place] [facility];  Service: Cardiovascular;  Laterality: N/A;   TRANSCATHETER AORTIC VALVE REPLACEMENT, TRANSFEMORAL N/A 05/31/2021   Procedure: Transcatheter Aortic Valve Replacement 91m, Transfemoral;  Surgeon: CSherren Mocha MD;  Location: [place]  Service: Open Heart Surgery;  Laterality: N/A;  Percutaneous   Family History Family History  Problem Relation Age of Onset   Cancer Father    Arrhythmia Sister        Atrial fibrillation    Social History Social History   Tobacco Use   Smoking status: Never    Passive exposure: Never   Smokeless tobacco: Never  Vaping Use   Vaping Use: Never used  Substance Use Topics   Alcohol use: No   Drug use: No   Allergies Cat hair extract  Review of Systems Review of Systems  All other systems reviewed and are negative.   Physical Exam Vital Signs  I have reviewed the triage vital signs BP 127/70   Pulse 76   Temp 97.6 F (36.4 C)  (Oral)   Resp 18   Ht 5' (1.524 m)   Wt 69.6 kg   SpO2 98%   BMI 29.97 kg/m  Physical Exam Vitals and nursing note reviewed.  Constitutional:      General: She is not in acute distress.    Appearance: She is well-developed.  HENT:     Head: Normocephalic and atraumatic.     Mouth/Throat:  Mouth: Mucous membranes are moist.  Eyes:     Pupils: Pupils are equal, round, and reactive to light.  Cardiovascular:     Rate and Rhythm: Normal rate and regular rhythm.     Heart sounds: No murmur heard. Pulmonary:     Effort: Pulmonary effort is normal. No respiratory distress.     Breath sounds: Normal breath sounds.  Abdominal:     General: Abdomen is flat.     Palpations: Abdomen is soft.     Tenderness: There is no abdominal tenderness.  Musculoskeletal:        General: No tenderness.     Right lower leg: No edema.     Left lower leg: No edema.  Skin:    General: Skin is warm and dry.  Neurological:     General: No focal deficit present.     Mental Status: She is alert. Mental status is at baseline.     Comments: Cranial nerves II through XII intact, strength 5 out of 5 in the bilateral upper and lower extremities, no sensory deficit to light touch, no dysmetria on finger-nose-finger testing, ambulatory with steady gait.   Psychiatric:        Mood and Affect: Mood normal.        Behavior: Behavior normal.     ED Results and Treatments Labs (all labs ordered are listed, but only abnormal results are displayed) Labs Reviewed  BASIC METABOLIC PANEL - Abnormal; Notable for the following components:      Result Value   Glucose, Bld 122 (*)    All other components within normal limits  URINALYSIS, ROUTINE W REFLEX MICROSCOPIC - Abnormal; Notable for the following components:   APPearance CLOUDY (*)    Protein, ur 30 (*)    Nitrite POSITIVE (*)    Leukocytes,Ua LARGE (*)    WBC, UA >50 (*)    Bacteria, UA MANY (*)    Non Squamous Epithelial 0-5 (*)    All other  components within normal limits  CBC                                                                                                                          Radiology No results found.  Pertinent labs & imaging results that were available during my care of the patient were reviewed by me and considered in my medical decision making (see MDM for details).  Medications Ordered in ED Medications  cefdinir (OMNICEF) capsule 300 mg (300 mg Oral Given 10/21/21 1851)  Procedures Procedures  (including critical care time)  Medical Decision Making / ED Course   MDM:  88 year old female presenting to the emergency department with dizziness.  Neurologic exam reassuring.  Symptoms have improved in the emergency department.  CT scan ordered in triage negative.  Lightheadedness may be related to sign of urine infection on urinalysis.  Patient has many WBC,, bacteria, positive nitrites and leukocytes.  Not having significant symptoms but given results and lightheadedness we will treat.  No focal neurologic signs to suggest acute stroke and patient not having vertiginous symptoms.  Work-up otherwise reassuring including BMP and CBC. Will discharge patient to home. All questions answered. Patient comfortable with plan of discharge. Return precautions discussed with patient and specified on the after visit summary.      Additional history obtained: -Additional history obtained from family -External records from outside source obtained and reviewed including: Chart review including previous notes, labs, imaging, consultation notes   Lab Tests: -I ordered, reviewed, and interpreted labs.   The pertinent results include:   Labs Reviewed  BASIC METABOLIC PANEL - Abnormal; Notable for the following components:      Result Value   Glucose, Bld 122 (*)    All other  components within normal limits  URINALYSIS, ROUTINE W REFLEX MICROSCOPIC - Abnormal; Notable for the following components:   APPearance CLOUDY (*)    Protein, ur 30 (*)    Nitrite POSITIVE (*)    Leukocytes,Ua LARGE (*)    WBC, UA >50 (*)    Bacteria, UA MANY (*)    Non Squamous Epithelial 0-5 (*)    All other components within normal limits  CBC      EKG   EKG Interpretation  Date/Time:  Friday October 21 2021 13:09:17 EDT Ventricular Rate:  84 PR Interval:    QRS Duration: 96 QT Interval:  382 QTC Calculation: 451 R Axis:   -54 Text Interpretation: Atrial fibrillation Incomplete right bundle branch block Left anterior fascicular block Nonspecific ST abnormality Abnormal ECG When compared with ECG of 19-Aug-2021 17:14, PREVIOUS ECG IS PRESENT Confirmed by Garnette Gunner [id number]) on 10/21/2021 5:21:03 PM         Imaging Studies ordered: I ordered imaging studies including CT head On my interpretation imaging demonstrates no acute process I independently visualized and interpreted imaging. I agree with the radiologist interpretation   Medicines ordered and prescription drug management: Meds ordered this encounter  Medications   cefdinir (OMNICEF) capsule 300 mg   cefdinir (OMNICEF) 300 MG capsule    Sig: Take 1 capsule (300 mg total) by mouth 2 (two) times daily for 7 days.    Dispense:  14 capsule    Refill:  0    -I have reviewed the patients home medicines and have made adjustments as needed     Reevaluation: After the interventions noted above, I reevaluated the patient and found that they have resolved  Co morbidities that complicate the patient evaluation  Past Medical History:  Diagnosis Date   Aortic stenosis    mild; mild MR; slightly increased pulmonary artery pressure with mild RVH; normal LV-2010   Arthritis    Atrial fibrillation (HCC)    chronic anticoagulation; adequate HR control on minimal AV nodal blocking medication    Cancer ([place])     skin cancers removed from face   CHF (congestive heart failure) ([place])    Diabetes mellitus without complication ([place])    2   Dyspnea    Fasting hyperglycemia    +  microalbuminuria; A1c of 6.5% in 11/2010   Hematochezia 01/2009   01/2009-presumed ischemic colitis; history of diverticulosis   History of kidney stones    Hx of adenomatous colonic polyps 2005   adenomatous polyp; negative colonoscopy in 2009   Hyperlipidemia    Stroke [facility])       Dispostion: Discharge    Final Clinical Impression(s) / ED Diagnoses Final diagnoses:  Acute cystitis without hematuria  History of unsteady gait     This chart was dictated using voice recognition software.  Despite best efforts to proofread,  errors can occur which can change the documentation meaning.    Cristie Hem, MD 11/17/21 0800

## 2021-11-19 ENCOUNTER — Emergency Department (HOSPITAL_COMMUNITY)
Admission: EM | Admit: 2021-11-19 | Discharge: 2021-11-19 | Disposition: A | Discharge status: Home / Self Care | Payer: Medicare HMO | Attending: Emergency Medicine | Admitting: Emergency Medicine

## 2021-11-19 ENCOUNTER — Other Ambulatory Visit: Payer: Self-pay

## 2021-11-19 ENCOUNTER — Encounter (HOSPITAL_COMMUNITY): Payer: Self-pay | Admitting: *Deleted

## 2021-11-19 DIAGNOSIS — R82998 Other abnormal findings in urine: Secondary | ICD-10-CM | POA: Diagnosis present

## 2021-11-19 DIAGNOSIS — Z7901 Long term (current) use of anticoagulants: Secondary | ICD-10-CM | POA: Diagnosis not present

## 2021-11-19 DIAGNOSIS — Z7984 Long term (current) use of oral hypoglycemic drugs: Secondary | ICD-10-CM | POA: Diagnosis not present

## 2021-11-19 DIAGNOSIS — E119 Type 2 diabetes mellitus without complications: Secondary | ICD-10-CM | POA: Diagnosis not present

## 2021-11-19 DIAGNOSIS — N3000 Acute cystitis without hematuria: Secondary | ICD-10-CM | POA: Insufficient documentation

## 2021-11-19 LAB — URINALYSIS, ROUTINE W REFLEX MICROSCOPIC
Bilirubin Urine: NEGATIVE
Glucose, UA: NEGATIVE mg/dL
Ketones, ur: NEGATIVE mg/dL
Nitrite: NEGATIVE
Protein, ur: 100 mg/dL — AB
Specific Gravity, Urine: 1.009 (ref 1.005–1.030)
WBC, UA: >50 WBC/hpf — ABNORMAL HIGH (ref 0–5)
pH: 5 (ref 5.0–8.0)

## 2021-11-19 MED ORDER — SODIUM CHLORIDE 0.9 % IV SOLN
1.0000 g | Freq: Once | INTRAVENOUS | Status: DC
  Administered 2021-11-19: 1 g via INTRAVENOUS
  Filled 2021-11-19: qty 10

## 2021-11-19 MED ORDER — CEFPODOXIME PROXETIL 200 MG PO TABS: 200.0000 mg | ORAL_TABLET | Freq: Two times a day (BID) | ORAL | 0 refills | Status: AC

## 2021-11-19 MED ORDER — CEFTRIAXONE SODIUM 1 G IJ SOLR: 1.0000 g | Freq: Once | INTRAMUSCULAR | Status: DC

## 2021-11-19 NOTE — ED Provider Notes (Signed)
[facility] EMERGENCY DEPARTMENT Provider Note   CSN: [id number] Arrival date & time: 11/19/21  1304     History  Chief Complaint  Patient presents with   urine odor    Anne Wolf is a 88 y.o. female with chronic Afib on warfarin, HLD, aortic stenosis s/p TAVR, h/o TIA, h/o DVT, T2DM, recurrent nephrolithiasis presents with malodorous urine.  Patient presents stating she believes she has a UTI. Recently completed macrobid for UTI, and states she has actually had 3 UTIs recently all treated with macrobid, but is soon as she stops taking the Macrobid, she experiences the malodorous urine again.  She cannot smell it, but her husband does.  Denies dysuria/hematuria, F/C, nausea/vomiting, flank pain.  States she would not know she had a UTI unless her husband told her.  She recently had a friend passed away from urosepsis and feels very concerned about her recurrent UTIs.  Otherwise feels well in her normal state of health.  HPI     Home Medications Prior to Admission medications   Medication Sig Start Date End Date Taking? Authorizing Provider  alendronate (FOSAMAX) 70 MG tablet Take 70 mg by mouth once a week.    [provider]  Biotin 5000 MCG CAPS Take 5,000 mcg by mouth daily.    [provider]  calcium carbonate (OSCAL) 1500 (600 Ca) MG TABS tablet Take 600 mg of elemental calcium by mouth daily.    [provider]  cholecalciferol (VITAMIN D) 1000 units tablet Take 1,000 Units by mouth at bedtime.    [provider]  Co-Enzyme Q-10 100 MG CAPS Take 100 mg by mouth at bedtime.    [provider]  ferrous sulfate 325 (65 FE) MG tablet Take 325 mg by mouth daily with breakfast.    [provider]  lisinopril (PRINIVIL,ZESTRIL) 10 MG tablet Take 10 mg by mouth at bedtime.    [provider]  loratadine (CLARITIN) 10 MG tablet Take 10 mg by mouth at bedtime.    [provider]  metFORMIN (GLUCOPHAGE) 500 MG  tablet Take 500 mg by mouth 2 (two) times daily with a meal.     [provider]  metoprolol succinate (TOPROL-XL) 25 MG 24 hr tablet Take 25 mg by mouth at bedtime.    [provider]  Multiple Vitamins-Minerals (CENTRUM SILVER) tablet Take 1 tablet by mouth at bedtime.     [provider]  simvastatin (ZOCOR) 40 MG tablet Take 40 mg by mouth at bedtime.      [provider]  warfarin (COUMADIN) 2 MG tablet Take 2 mg by mouth daily. 1 tablet five days a weeks 0.5 tablet by mouth 2 days a week    [provider]      Allergies    Cat hair extract    Review of Systems   Review of Systems Review of systems negative for fevers or chills.  A 10 point review of systems was performed and is negative unless otherwise reported in HPI.  Physical Exam Updated Vital Signs BP 139/68 (BP Location: Right Arm)   Pulse 73   Temp 97.9 F (36.6 C) (Oral)   Resp 17   Ht 5' (1.524 m)   Wt 68 kg   SpO2 100%   BMI 29.29 kg/m  Physical Exam General: Normal appearing elderly female, lying in bed.  HEENT: Sclera anicteric, MMM, trachea midline. Cardiology: RRR, no murmurs/rubs/gallops. Resp: Normal respiratory rate and effort. CTAB, no wheezes, rhonchi,  crackles.  Abd: Soft, non-tender, non-distended. No rebound tenderness or guarding.  GU: Deferred. MSK: No peripheral edema or signs of trauma. No cyanosis or clubbing. Skin: warm, dry. No rashes or lesions. Back: No CVA tenderness Neuro: A&Ox4, CNs II-XII grossly intact. MAEs. Sensation grossly intact.  Psych: Normal mood and affect.   ED Results / Procedures / Treatments   Labs (all labs ordered are listed, but only abnormal results are displayed) Labs Reviewed  URINALYSIS, ROUTINE W REFLEX MICROSCOPIC - Abnormal; Notable for the following components:      Result Value   APPearance HAZY (*)    Hgb urine dipstick MODERATE (*)    Protein, ur 100 (*)    Leukocytes,Ua MODERATE (*)    WBC, UA >50 (*)     Bacteria, UA RARE (*)    All other components within normal limits  URINE CULTURE    Procedures Procedures    Medications Ordered in ED Medications  cefTRIAXone (ROCEPHIN) injection 1 g (has no administration in time range)    ED Course/ Medical Decision Making/ A&P                          Medical Decision Making Amount and/or Complexity of Data Reviewed Labs: ordered.    Patient is overall well-appearing with no tachycardia or fever to suggest bacteremia/sepsis. No flank pain to raise c/f nephrolithiasis or pyelonephritis. No abdominal pain to suggest acute surgical intraabdominal infection such as appendicitis or diverticulitis. Patient feels well is in her normal state of health except for having malodorous urine that was noticed by her husband.    Patient's urine demonstrates e/o infection. Previous cultures have grown E Coli pansensitive. Patient has had several UTIs all treated with Macrobid and states that the symptoms simply come back.  I discussed with the patient that we will treat her with IM ceftriaxone 1 dose here in the emergency department and discharged on a cephalosporin, a different antibiotic than Macrobid. I encouraged her to call her primary care physician on Monday morning and follow-up this week as she potentially should be on suppressive antibiotics. Renal function tested on 10/21/21 is wnl. Will treat with one dose of IV ceftriaxone here in ED and discharge with cefpodoxime 200 mg BID x 7 days.  Patient will be discharged with discharge instructions and return precautions.  All questions answered to patient and her husband satisfaction.  Dispo: DC   I have personally reviewed and interpreted all labs.            Final Clinical Impression(s) / ED Diagnoses Final diagnoses:  Acute cystitis without hematuria    Rx / DC Orders ED Discharge Orders     None        This note was created using dictation software, which may contain spelling or  grammatical errors.    Audley Hose, MD 11/19/21 [phone]

## 2021-11-19 NOTE — Discharge Instructions (Addendum)
Thank you for coming to [facility] Emergency Department. You were seen for malodorous urine. We did an exam, labs, and these showed a urinary tract infection.  We treated you with 1 dose of intramuscular antibiotics in the emergency department and will discharge you with cefpodoxime 200 mg twice per day for 7 days. Please follow up with your primary care provider within 1 week.  Please call them on Monday morning to make an appointment.  You may need to discuss suppressive antibiotic therapy.  Do not hesitate to return to the ED or call 911 if you experience: -Worsening symptoms -Lightheadedness, passing out -Fevers/chills -Anything else that concerns you

## 2021-11-19 NOTE — ED Triage Notes (Signed)
Pt believes she has an UTI again.  Recently finished Macrobid for UTI. Pt's only complaint is foul odor of her urine. Denies any burning with urination or fever or N/V.

## 2021-11-19 NOTE — ED Notes (Signed)
Up to b/r at time of d/c, denies questions or needs, sx or complaints, denies pain, dizziness or nausea. Husband at [facility].

## 2021-11-22 LAB — URINE CULTURE: Culture: 80000 — AB

## 2021-11-28 ENCOUNTER — Ambulatory Visit: Payer: Medicare HMO | Attending: Cardiology | Admitting: *Deleted

## 2021-11-28 DIAGNOSIS — Z5181 Encounter for therapeutic drug level monitoring: Secondary | ICD-10-CM

## 2021-11-28 DIAGNOSIS — I4819 Other persistent atrial fibrillation: Secondary | ICD-10-CM | POA: Diagnosis not present

## 2021-11-28 LAB — POCT INR: INR: 1.9 — AB (ref 2.0–3.0)

## 2021-11-28 NOTE — Patient Instructions (Signed)
S/P TAVR on 05/31/21.  Take warfarin 1 1/2 tablets tonight then resume 1 tablet daily except 1/2 tablet on Sundays and Wednesday Recheck in 4 wks

## 2021-12-08 ENCOUNTER — Ambulatory Visit: Payer: Medicare HMO | Admitting: Urology

## 2021-12-08 ENCOUNTER — Encounter: Payer: Self-pay | Admitting: Urology

## 2021-12-08 VITALS — BP 138/82 | HR 85 | Ht 60.0 in | Wt 150.0 lb

## 2021-12-08 DIAGNOSIS — N390 Urinary tract infection, site not specified: Secondary | ICD-10-CM | POA: Diagnosis not present

## 2021-12-08 DIAGNOSIS — R8290 Unspecified abnormal findings in urine: Secondary | ICD-10-CM | POA: Diagnosis not present

## 2021-12-08 LAB — URINALYSIS, ROUTINE W REFLEX MICROSCOPIC
Bilirubin, UA: NEGATIVE
Glucose, UA: NEGATIVE
Ketones, UA: NEGATIVE
Nitrite, UA: POSITIVE — AB
Specific Gravity, UA: 1.015 (ref 1.005–1.030)
Urobilinogen, Ur: 0.2 mg/dL (ref 0.2–1.0)
pH, UA: 6 (ref 5.0–7.5)

## 2021-12-08 LAB — MICROSCOPIC EXAMINATION: WBC, UA: >30 /hpf — ABNORMAL HIGH (ref 0–5)

## 2021-12-08 LAB — BLADDER SCAN AMB NON-IMAGING: Scan Result: 0

## 2021-12-08 NOTE — Progress Notes (Signed)
post void residual =0mL 

## 2021-12-08 NOTE — Progress Notes (Signed)
Assessment: 1. Frequent UTI   2. Abnormal urine findings     Plan: I reviewed the patient's records from her recent ER visit. I personally reviewed available urine culture results from June and September 2023. Methods to reduce the risk of UTIs discussed with the patient including increased fluid intake, timed and double voiding, daily cranberry supplement, and daily probiotics. Urine culture sent today due to the abnormal urinalysis. We will contact her with culture results and recommendations. Consider management with low-dose antibiotic suppression given frequency of UTIs. Return to office in 1 month.  Chief Complaint:  Chief Complaint  Patient presents with   Recurrent UTI    History of Present Illness:  Anne Wolf is a 88 y.o. female who is seen in consultation from Asencion Noble, MD for evaluation of frequent UTI's.  She reports UTIs for the past year.  Prior to this, she was not having frequent UTIs. She has recently been treated for several UTIs by Dr. Willey Blade.  She does not have typical UTI symptoms.  No dysuria, gross hematuria, flank pain, low back pain, fever, or chills.  Her husband notes a malodorous urine.  She has received Macrobid on several occasions for treatment..  When she stops taking the antibiotic she experiences a recurrence of the malodorous urine.  She was recently treated with IM Rocephin and cefpodoxime x7 days. She reports resolution of the malodorous urine following this treatment. Urine culture results: 11/19/21 80 K E. Coli 6/23  >100 K E. Coli  She reports baseline symptoms of urinary frequency and urgency.  No problems with her bowel movements.  She has a history of nephrolithiasis.  She has previously been treated with ureteroscopy and shockwave lithotripsy. CT from 11/19 showed a 5 mm lower pole renal calculus.  Past Medical History:  Past Medical History:  Diagnosis Date   Aortic stenosis    mild; mild MR; slightly increased pulmonary  artery pressure with mild RVH; normal LV-2010   Arthritis    Atrial fibrillation (HCC)    chronic anticoagulation; adequate HR control on minimal AV nodal blocking medication    Cancer ([place])    skin cancers removed from face   CHF (congestive heart failure) ([place])    Diabetes mellitus without complication ([place])    2   Dyspnea    Fasting hyperglycemia    + microalbuminuria; A1c of 6.5% in 11/2010   Hematochezia 01/2009   01/2009-presumed ischemic colitis; history of diverticulosis   History of kidney stones    Hx of adenomatous colonic polyps 2005   adenomatous polyp; negative colonoscopy in 2009   Hyperlipidemia    Stroke [facility])     Past Surgical History:  Past Surgical History:  Procedure Laterality Date   CATARACT EXTRACTION W/PHACO Right 03/23/2014   Procedure: CATARACT EXTRACTION PHACO AND INTRAOCULAR LENS PLACEMENT ([place]);  Surgeon: Williams Che, MD;  Location: [facility];  Service: Ophthalmology;  Laterality: Right;  CDE:2.83   CATARACT EXTRACTION W/PHACO Left 07/27/2014   Procedure: CATARACT EXTRACTION PHACO AND INTRAOCULAR LENS PLACEMENT LEFT EYE CDE=6.97;  Surgeon: Williams Che, MD;  Location: [facility];  Service: Ophthalmology;  Laterality: Left;   COLONOSCOPY  2009   negative; prior study with adenomatous polyp   CYSTOSCOPY/URETEROSCOPY/HOLMIUM LASER/STENT PLACEMENT Right 08/21/2016   Procedure: CYSTOSCOPY/URETEROSCOPY/RETROGRADE PYELOGRAM//STENT PLACEMENT;  Surgeon: Nickie Retort, MD;  Location: [facility];  Service: Urology;  Laterality: Right;   EYE SURGERY     INTRAOPERATIVE TRANSTHORACIC ECHOCARDIOGRAM N/A 05/31/2021   Procedure: INTRAOPERATIVE TRANSTHORACIC  ECHOCARDIOGRAM;  Surgeon: Sherren Mocha, MD;  Location: [place];  Service: Open Heart Surgery;  Laterality: N/A;   RIGHT/LEFT HEART CATH AND CORONARY ANGIOGRAPHY N/A 02/04/2021   Procedure: RIGHT/LEFT HEART CATH AND CORONARY ANGIOGRAPHY;  Surgeon: Sherren Mocha, MD;  Location: [place] CV [facility];  Service:  Cardiovascular;  Laterality: N/A;   TRANSCATHETER AORTIC VALVE REPLACEMENT, TRANSFEMORAL N/A 05/31/2021   Procedure: Transcatheter Aortic Valve Replacement 63m, Transfemoral;  Surgeon: CSherren Mocha MD;  Location: [facility]  Service: Open Heart Surgery;  Laterality: N/A;  Percutaneous    Allergies:  Allergies  Allergen Reactions   Cat Hair Extract Other (See Comments)    Red eyes and Sneezing Also allergic to Dogs    Family History:  Family History  Problem Relation Age of Onset   Cancer Father    Arrhythmia Sister        Atrial fibrillation    Social History:  Social History   Tobacco Use   Smoking status: Never    Passive exposure: Never   Smokeless tobacco: Never  Vaping Use   Vaping Use: Never used  Substance Use Topics   Alcohol use: No   Drug use: No    Review of symptoms:  Constitutional:  Negative for unexplained weight loss, night sweats, fever, chills ENT:  Negative for nose bleeds, sinus pain, painful swallowing CV:  Negative for chest pain, shortness of breath, exercise intolerance, palpitations, loss of consciousness Resp:  Negative for cough, wheezing, shortness of breath GI:  Negative for nausea, vomiting, diarrhea, bloody stools GU:  Positives noted in HPI; otherwise negative for gross hematuria, dysuria, urinary incontinence Neuro:  Negative for seizures, poor balance, limb weakness, slurred speech Psych:  Negative for lack of energy, depression, anxiety Endocrine:  Negative for polydipsia, polyuria, symptoms of hypoglycemia (dizziness, hunger, sweating) Hematologic:  Negative for anemia, purpura, petechia, prolonged or excessive bleeding, use of anticoagulants  Allergic:  Negative for difficulty breathing or choking as a result of exposure to anything; no shellfish allergy; no allergic response (rash/itch) to materials, foods  Physical exam: BP 138/82   Pulse 85   Ht 5' (1.524 m)   Wt 150 lb (68 kg)   BMI 29.29 kg/m  GENERAL APPEARANCE:  Well  appearing, well developed, well nourished, NAD HEENT: Atraumatic, Normocephalic, oropharynx clear. NECK: Supple without lymphadenopathy or thyromegaly. LUNGS: Clear to auscultation bilaterally. HEART: Regular Rate and Rhythm without murmurs, gallops, or rubs. ABDOMEN: Soft, non-tender, No Masses. EXTREMITIES: Moves all extremities well.  Without clubbing, cyanosis, or edema. NEUROLOGIC:  Alert and oriented x 3, normal gait, CN II-XII grossly intact.  MENTAL STATUS:  Appropriate. BACK:  Non-tender to palpation.  No CVAT SKIN:  Warm, dry and intact.    Results: U/A: >30 WBCs, 0-2 RBCs, many bacteria, nitrite positive  PVR = 0 ml

## 2021-12-13 ENCOUNTER — Telehealth: Payer: Self-pay

## 2021-12-13 LAB — URINE CULTURE

## 2021-12-13 MED ORDER — CEPHALEXIN 500 MG PO CAPS: 500.0000 mg | ORAL_CAPSULE | Freq: Every day | ORAL | 2 refills | Status: DC

## 2021-12-13 MED ORDER — CEFDINIR 300 MG PO CAPS: 300.0000 mg | ORAL_CAPSULE | Freq: Two times a day (BID) | ORAL | 0 refills | Status: AC

## 2021-12-13 NOTE — Telephone Encounter (Signed)
-----   Message from Primus Bravo, MD sent at 12/13/2021 10:52 AM EDT ----- Please notify patient to begin Cefdinir x 7 days for UTI treatment.  She should then take Keflex daily for UTI prevention.  Rxs sent to [place]. Keep f/u for 1 month.

## 2021-12-13 NOTE — Addendum Note (Signed)
Addended by: Primus Bravo on: 12/13/2021 10:52 AM   Modules accepted: Orders

## 2021-12-13 NOTE — Telephone Encounter (Signed)
Patient aware of MD response to urine culture and anx instructions.  Patient voiced understanding and will f/u as scheduled.

## 2021-12-26 ENCOUNTER — Ambulatory Visit: Payer: Medicare HMO | Attending: Cardiology | Admitting: *Deleted

## 2021-12-26 DIAGNOSIS — I4819 Other persistent atrial fibrillation: Secondary | ICD-10-CM | POA: Diagnosis not present

## 2021-12-26 DIAGNOSIS — Z5181 Encounter for therapeutic drug level monitoring: Secondary | ICD-10-CM | POA: Diagnosis not present

## 2021-12-26 LAB — POCT INR: INR: 3.5 — AB (ref 2.0–3.0)

## 2021-12-26 NOTE — Patient Instructions (Signed)
Been on Abx for UTI.  Finished now. Hold warfarin tomorrow then resume 1 tablet daily except 1/2 tablet on Sundays and Wednesday Recheck in 4 wks

## 2021-12-27 DIAGNOSIS — E119 Type 2 diabetes mellitus without complications: Secondary | ICD-10-CM | POA: Diagnosis not present

## 2021-12-27 DIAGNOSIS — H1045 Other chronic allergic conjunctivitis: Secondary | ICD-10-CM | POA: Diagnosis not present

## 2021-12-27 DIAGNOSIS — H04123 Dry eye syndrome of bilateral lacrimal glands: Secondary | ICD-10-CM | POA: Diagnosis not present

## 2021-12-27 DIAGNOSIS — H43812 Vitreous degeneration, left eye: Secondary | ICD-10-CM | POA: Diagnosis not present

## 2022-01-20 DIAGNOSIS — R569 Unspecified convulsions: Secondary | ICD-10-CM

## 2022-01-20 HISTORY — DX: Unspecified convulsions: R56.9

## 2022-01-23 ENCOUNTER — Ambulatory Visit: Payer: Medicare HMO | Attending: Cardiology | Admitting: *Deleted

## 2022-01-23 DIAGNOSIS — Z5181 Encounter for therapeutic drug level monitoring: Secondary | ICD-10-CM

## 2022-01-23 DIAGNOSIS — I4819 Other persistent atrial fibrillation: Secondary | ICD-10-CM

## 2022-01-23 LAB — POCT INR: INR: 2.2 (ref 2.0–3.0)

## 2022-01-23 NOTE — Patient Instructions (Signed)
Been on Abx for UTI.  Finished now. Continue warfarin 1 tablet daily except 1/2 tablet on Sundays and Wednesday Recheck in 4 wks

## 2022-01-24 ENCOUNTER — Ambulatory Visit: Payer: Medicare HMO | Admitting: Urology

## 2022-01-24 ENCOUNTER — Encounter: Payer: Self-pay | Admitting: Urology

## 2022-01-24 ENCOUNTER — Ambulatory Visit (INDEPENDENT_AMBULATORY_CARE_PROVIDER_SITE_OTHER): Payer: Medicare HMO | Admitting: Urology

## 2022-01-24 VITALS — BP 151/88 | HR 80

## 2022-01-24 DIAGNOSIS — N390 Urinary tract infection, site not specified: Secondary | ICD-10-CM | POA: Diagnosis not present

## 2022-01-24 DIAGNOSIS — Z87442 Personal history of urinary calculi: Secondary | ICD-10-CM

## 2022-01-24 DIAGNOSIS — N200 Calculus of kidney: Secondary | ICD-10-CM

## 2022-01-24 DIAGNOSIS — R8290 Unspecified abnormal findings in urine: Secondary | ICD-10-CM

## 2022-01-24 DIAGNOSIS — N3281 Overactive bladder: Secondary | ICD-10-CM | POA: Diagnosis not present

## 2022-01-24 DIAGNOSIS — Z8742 Personal history of other diseases of the female genital tract: Secondary | ICD-10-CM | POA: Diagnosis not present

## 2022-01-24 LAB — MICROSCOPIC EXAMINATION: Bacteria, UA: NONE SEEN

## 2022-01-24 LAB — URINALYSIS, ROUTINE W REFLEX MICROSCOPIC
Bilirubin, UA: NEGATIVE
Glucose, UA: NEGATIVE
Nitrite, UA: NEGATIVE
Specific Gravity, UA: 1.03 (ref 1.005–1.030)
Urobilinogen, Ur: 0.2 mg/dL (ref 0.2–1.0)
pH, UA: 5 (ref 5.0–7.5)

## 2022-01-24 MED ORDER — MIRABEGRON ER 25 MG PO TB24: 25.0000 mg | ORAL_TABLET | Freq: Every day | ORAL | 0 refills | Status: DC

## 2022-01-24 NOTE — Progress Notes (Signed)
01/24/2022 11:21 AM   Anne Wolf August 31, 1933 [id number]  Referring provider: Asencion Noble, MD [address] [place],  [place] [address]  Followup recurrent UTI   HPI: [name] is a 88yo here for followup for recurrent UTI. UA today shows CaOx crystals. She has a history of nephrolithiasis but no imaging in 4-5 years.  No UTI since last visit. She was placed on keflex '500mg'$  daily since last visit. She denies any incontinence pad usage. She does have urinary urgency and urge incontinence. She has urinary frequency every 2 hours. She has a hx of CVA in Dec 2022.    PMH: Past Medical History:  Diagnosis Date   Aortic stenosis    mild; mild MR; slightly increased pulmonary artery pressure with mild RVH; normal LV-2010   Arthritis    Atrial fibrillation (HCC)    chronic anticoagulation; adequate HR control on minimal AV nodal blocking medication    Cancer ([place])    skin cancers removed from face   CHF (congestive heart failure) ([place])    Diabetes mellitus without complication ([place])    2   Dyspnea    Fasting hyperglycemia    + microalbuminuria; A1c of 6.5% in 11/2010   Hematochezia 01/2009   01/2009-presumed ischemic colitis; history of diverticulosis   History of kidney stones    Hx of adenomatous colonic polyps 2005   adenomatous polyp; negative colonoscopy in 2009   Hyperlipidemia    Stroke [facility])     Surgical History: Past Surgical History:  Procedure Laterality Date   CATARACT EXTRACTION W/PHACO Right 03/23/2014   Procedure: CATARACT EXTRACTION PHACO AND INTRAOCULAR LENS PLACEMENT ([place]);  Surgeon: Williams Che, MD;  Location: [facility];  Service: Ophthalmology;  Laterality: Right;  CDE:2.83   CATARACT EXTRACTION W/PHACO Left 07/27/2014   Procedure: CATARACT EXTRACTION PHACO AND INTRAOCULAR LENS PLACEMENT LEFT EYE CDE=6.97;  Surgeon: Williams Che, MD;  [facility];  Service: Ophthalmology;  Laterality: Left;   COLONOSCOPY  2009   negative; prior study  with adenomatous polyp   CYSTOSCOPY/URETEROSCOPY/HOLMIUM LASER/STENT PLACEMENT Right 08/21/2016   Procedure: CYSTOSCOPY/URETEROSCOPY/RETROGRADE PYELOGRAM//STENT PLACEMENT;  Surgeon: Nickie Retort, MD;  [facility];  Service: Urology;  Laterality: Right;   EYE SURGERY     INTRAOPERATIVE TRANSTHORACIC ECHOCARDIOGRAM N/A 05/31/2021   Procedure: INTRAOPERATIVE TRANSTHORACIC ECHOCARDIOGRAM;  Surgeon: Sherren Mocha, MD;  Location: [place];  Service: Open Heart Surgery;  Laterality: N/A;   RIGHT/LEFT HEART CATH AND CORONARY ANGIOGRAPHY N/A 02/04/2021   Procedure: RIGHT/LEFT HEART CATH AND CORONARY ANGIOGRAPHY;  Surgeon: Sherren Mocha, MD;  Location: [place] CV LAB;  Service: Cardiovascular;  Laterality: N/A;   TRANSCATHETER AORTIC VALVE REPLACEMENT, TRANSFEMORAL N/A 05/31/2021   Procedure: Transcatheter Aortic Valve Replacement 42m, Transfemoral;  Surgeon: CSherren Mocha MD;  Location: [facility]  Service: Open Heart Surgery;  Laterality: N/A;  Percutaneous    Home Medications:  Allergies as of 01/24/2022       Reactions   Cat Hair Extract Other (See Comments)   Red eyes and Sneezing Also allergic to Dogs        Medication List        Accurate as of January 24, 2022 11:21 AM. If you have any questions, ask your nurse or doctor.          alendronate 70 MG tablet Commonly known as: FOSAMAX Take 70 mg by mouth once a week.   Biotin 5000 MCG Caps Take 5,000 mcg by mouth daily.   calcium carbonate 1500 (  600 Ca) MG Tabs tablet Commonly known as: OSCAL Take 600 mg of elemental calcium by mouth daily.   Centrum Silver tablet Take 1 tablet by mouth at bedtime.   cephALEXin 500 MG capsule Commonly known as: KEFLEX Take 1 capsule (500 mg total) by mouth daily. Begin after completing Cefdinir   cholecalciferol 1000 units tablet Commonly known as: VITAMIN D Take 1,000 Units by mouth at bedtime.   Co-Enzyme Q-10 100 MG Caps Take 100 mg by mouth at bedtime.   ferrous  sulfate 325 (65 FE) MG tablet Take 325 mg by mouth daily with breakfast.   lisinopril 10 MG tablet Commonly known as: ZESTRIL Take 10 mg by mouth at bedtime.   loratadine 10 MG tablet Commonly known as: CLARITIN Take 10 mg by mouth at bedtime.   metFORMIN 500 MG tablet Commonly known as: GLUCOPHAGE Take 500 mg by mouth 2 (two) times daily with a meal.   metoprolol succinate 25 MG 24 hr tablet Commonly known as: TOPROL-XL Take 25 mg by mouth at bedtime.   simvastatin 40 MG tablet Commonly known as: ZOCOR Take 40 mg by mouth at bedtime.   warfarin 2 MG tablet Commonly known as: COUMADIN Take as directed by the anticoagulation clinic. If you are unsure how to take this medication, talk to your nurse or doctor. Original instructions: Take 2 mg by mouth daily. 1 tablet five days a weeks 0.5 tablet by mouth 2 days a week        Allergies:  Allergies  Allergen Reactions   Cat Hair Extract Other (See Comments)    Red eyes and Sneezing Also allergic to Dogs    Family History: Family History  Problem Relation Age of Onset   Cancer Father    Arrhythmia Sister        Atrial fibrillation    Social History:  reports that she has never smoked. She has never been exposed to tobacco smoke. She has never used smokeless tobacco. She reports that she does not drink alcohol and does not use drugs.  ROS: All other review of systems were reviewed and are negative except what is noted above in HPI  Physical Exam: BP (!) 151/88   Pulse 80   Constitutional:  Alert and oriented, No acute distress. HEENT: [place] AT, moist mucus membranes.  Trachea midline, no masses. Cardiovascular: No clubbing, cyanosis, or edema. Respiratory: Normal respiratory effort, no increased work of breathing. GI: Abdomen is soft, nontender, nondistended, no abdominal masses GU: No CVA tenderness.  Lymph: No cervical or inguinal lymphadenopathy. Skin: No rashes, bruises or suspicious lesions. Neurologic:  Grossly intact, no focal deficits, moving all 4 extremities. Psychiatric: Normal mood and affect.  Laboratory Data: Lab Results  Component Value Date   WBC 8.0 10/21/2021   HGB 13.3 10/21/2021   HCT 40.8 10/21/2021   MCV 92.7 10/21/2021   PLT 160 10/21/2021    Lab Results  Component Value Date   CREATININE 0.75 10/21/2021    No results found for: "PSA"  No results found for: "TESTOSTERONE"  Lab Results  Component Value Date   HGBA1C 5.7 (H) 02/18/2021    Urinalysis    Component Value Date/Time   COLORURINE YELLOW 11/19/2021 1322   APPEARANCEUR Cloudy (A) 12/08/2021 1404   LABSPEC 1.009 11/19/2021 1322   PHURINE 5.0 11/19/2021 1322   GLUCOSEU Negative 12/08/2021 1404   HGBUR MODERATE (A) 11/19/2021 1322   BILIRUBINUR Negative 12/08/2021 1404   KETONESUR NEGATIVE 11/19/2021 1322   PROTEINUR 2+ (A)  12/08/2021 1404   PROTEINUR 100 (A) 11/19/2021 1322   UROBILINOGEN 0.2 08/19/2021 1604   NITRITE Positive (A) 12/08/2021 1404   NITRITE NEGATIVE 11/19/2021 1322   LEUKOCYTESUR 2+ (A) 12/08/2021 1404   LEUKOCYTESUR MODERATE (A) 11/19/2021 1322    Lab Results  Component Value Date   LABMICR See below: 12/08/2021   WBCUA >30 (H) 12/08/2021   LABEPIT 0-10 12/08/2021   BACTERIA Many (A) 12/08/2021    Pertinent Imaging:  No results found for this or any previous visit.  No results found for this or any previous visit.  No results found for this or any previous visit.  No results found for this or any previous visit.  No results found for this or any previous visit.  No valid procedures specified. No results found for this or any previous visit.  No results found for this or any previous visit.   Assessment & Plan:    1. Frequent UTI -Urine for culture - Urinalysis, Routine w reflex microscopic  2. Nephrolithiasis -CT stone study  3. OAB -We will trial mirabegron '25mg'$  daily   No follow-ups on file.  Nicolette Bang, MD  [facility]  [place]

## 2022-01-24 NOTE — Patient Instructions (Signed)

## 2022-01-26 LAB — URINE CULTURE: Organism ID, Bacteria: NO GROWTH

## 2022-02-01 ENCOUNTER — Other Ambulatory Visit: Payer: Self-pay | Admitting: Cardiology

## 2022-02-01 NOTE — Telephone Encounter (Signed)
Refill request for warfarin:  Last INR was 2.2 on 01/23/22 Next INR due 02/27/22 LOV was 11/04/21  Zandra Abts MD  Refill approved.

## 2022-02-02 NOTE — Progress Notes (Signed)
Sent via mychart

## 2022-02-03 DIAGNOSIS — E785 Hyperlipidemia, unspecified: Secondary | ICD-10-CM | POA: Diagnosis not present

## 2022-02-03 DIAGNOSIS — M810 Age-related osteoporosis without current pathological fracture: Secondary | ICD-10-CM | POA: Diagnosis not present

## 2022-02-03 DIAGNOSIS — I700 Atherosclerosis of aorta: Secondary | ICD-10-CM | POA: Diagnosis not present

## 2022-02-03 DIAGNOSIS — E1129 Type 2 diabetes mellitus with other diabetic kidney complication: Secondary | ICD-10-CM | POA: Diagnosis not present

## 2022-02-03 DIAGNOSIS — I4821 Permanent atrial fibrillation: Secondary | ICD-10-CM | POA: Diagnosis not present

## 2022-02-03 DIAGNOSIS — Z79899 Other long term (current) drug therapy: Secondary | ICD-10-CM | POA: Diagnosis not present

## 2022-02-10 ENCOUNTER — Encounter (HOSPITAL_COMMUNITY): Payer: Self-pay

## 2022-02-10 ENCOUNTER — Inpatient Hospital Stay (HOSPITAL_COMMUNITY)
Admission: EM | Admit: 2022-02-10 | Discharge: 2022-02-12 | DRG: 062 | Disposition: A | Discharge status: Home / Self Care | Payer: Medicare HMO | Attending: Neurology | Admitting: Neurology

## 2022-02-10 ENCOUNTER — Emergency Department (HOSPITAL_COMMUNITY): Payer: Medicare HMO

## 2022-02-10 ENCOUNTER — Other Ambulatory Visit: Payer: Self-pay

## 2022-02-10 DIAGNOSIS — R32 Unspecified urinary incontinence: Secondary | ICD-10-CM | POA: Diagnosis present

## 2022-02-10 DIAGNOSIS — E119 Type 2 diabetes mellitus without complications: Secondary | ICD-10-CM | POA: Diagnosis not present

## 2022-02-10 DIAGNOSIS — E785 Hyperlipidemia, unspecified: Secondary | ICD-10-CM | POA: Diagnosis present

## 2022-02-10 DIAGNOSIS — Z961 Presence of intraocular lens: Secondary | ICD-10-CM | POA: Diagnosis present

## 2022-02-10 DIAGNOSIS — Z7901 Long term (current) use of anticoagulants: Secondary | ICD-10-CM | POA: Diagnosis not present

## 2022-02-10 DIAGNOSIS — R29711 NIHSS score 11: Secondary | ICD-10-CM | POA: Diagnosis not present

## 2022-02-10 DIAGNOSIS — Z7983 Long term (current) use of bisphosphonates: Secondary | ICD-10-CM | POA: Diagnosis not present

## 2022-02-10 DIAGNOSIS — Z9282 Status post administration of tPA (rtPA) in a different facility within the last 24 hours prior to admission to current facility: Secondary | ICD-10-CM | POA: Diagnosis not present

## 2022-02-10 DIAGNOSIS — Z6831 Body mass index (BMI) 31.0-31.9, adult: Secondary | ICD-10-CM

## 2022-02-10 DIAGNOSIS — I6389 Other cerebral infarction: Secondary | ICD-10-CM | POA: Diagnosis not present

## 2022-02-10 DIAGNOSIS — I482 Chronic atrial fibrillation, unspecified: Secondary | ICD-10-CM | POA: Diagnosis not present

## 2022-02-10 DIAGNOSIS — R000 Tachycardia, unspecified: Secondary | ICD-10-CM | POA: Diagnosis not present

## 2022-02-10 DIAGNOSIS — I6523 Occlusion and stenosis of bilateral carotid arteries: Secondary | ICD-10-CM | POA: Diagnosis not present

## 2022-02-10 DIAGNOSIS — Z87442 Personal history of urinary calculi: Secondary | ICD-10-CM

## 2022-02-10 DIAGNOSIS — Z7984 Long term (current) use of oral hypoglycemic drugs: Secondary | ICD-10-CM | POA: Diagnosis not present

## 2022-02-10 DIAGNOSIS — R414 Neurologic neglect syndrome: Secondary | ICD-10-CM | POA: Diagnosis not present

## 2022-02-10 DIAGNOSIS — Z9841 Cataract extraction status, right eye: Secondary | ICD-10-CM | POA: Diagnosis not present

## 2022-02-10 DIAGNOSIS — Z79899 Other long term (current) drug therapy: Secondary | ICD-10-CM

## 2022-02-10 DIAGNOSIS — I639 Cerebral infarction, unspecified: Secondary | ICD-10-CM | POA: Diagnosis not present

## 2022-02-10 DIAGNOSIS — Z86010 Personal history of colonic polyps: Secondary | ICD-10-CM

## 2022-02-10 DIAGNOSIS — R569 Unspecified convulsions: Secondary | ICD-10-CM | POA: Diagnosis present

## 2022-02-10 DIAGNOSIS — G459 Transient cerebral ischemic attack, unspecified: Principal | ICD-10-CM | POA: Diagnosis present

## 2022-02-10 DIAGNOSIS — Z952 Presence of prosthetic heart valve: Secondary | ICD-10-CM | POA: Diagnosis not present

## 2022-02-10 DIAGNOSIS — E669 Obesity, unspecified: Secondary | ICD-10-CM | POA: Diagnosis not present

## 2022-02-10 DIAGNOSIS — I10 Essential (primary) hypertension: Secondary | ICD-10-CM | POA: Diagnosis present

## 2022-02-10 DIAGNOSIS — Z85828 Personal history of other malignant neoplasm of skin: Secondary | ICD-10-CM

## 2022-02-10 DIAGNOSIS — Z9842 Cataract extraction status, left eye: Secondary | ICD-10-CM | POA: Diagnosis not present

## 2022-02-10 DIAGNOSIS — I69354 Hemiplegia and hemiparesis following cerebral infarction affecting left non-dominant side: Secondary | ICD-10-CM | POA: Diagnosis not present

## 2022-02-10 DIAGNOSIS — R55 Syncope and collapse: Secondary | ICD-10-CM | POA: Diagnosis not present

## 2022-02-10 DIAGNOSIS — J3081 Allergic rhinitis due to animal (cat) (dog) hair and dander: Secondary | ICD-10-CM | POA: Diagnosis present

## 2022-02-10 DIAGNOSIS — R29898 Other symptoms and signs involving the musculoskeletal system: Secondary | ICD-10-CM

## 2022-02-10 LAB — CBC
HCT: 45.4 % (ref 36.0–46.0)
Hemoglobin: 14.4 g/dL (ref 12.0–15.0)
MCH: 29.2 pg (ref 26.0–34.0)
MCHC: 31.7 g/dL (ref 30.0–36.0)
MCV: 92.1 fL (ref 80.0–100.0)
Platelets: 183 10*3/uL (ref 150–400)
RBC: 4.93 MIL/uL (ref 3.87–5.11)
RDW: 13.9 % (ref 11.5–15.5)
WBC: 7.5 10*3/uL (ref 4.0–10.5)
nRBC: 0 % (ref 0.0–0.2)

## 2022-02-10 LAB — URINALYSIS, ROUTINE W REFLEX MICROSCOPIC
Bacteria, UA: NONE SEEN
Bilirubin Urine: NEGATIVE
Glucose, UA: NEGATIVE mg/dL
Ketones, ur: NEGATIVE mg/dL
Leukocytes,Ua: NEGATIVE
Nitrite: NEGATIVE
Protein, ur: 30 mg/dL — AB
Specific Gravity, Urine: 1.042 — ABNORMAL HIGH (ref 1.005–1.030)
pH: 7 (ref 5.0–8.0)

## 2022-02-10 LAB — COMPREHENSIVE METABOLIC PANEL
ALT: 20 U/L (ref 0–44)
AST: 34 U/L (ref 15–41)
Albumin: 4.2 g/dL (ref 3.5–5.0)
Alkaline Phosphatase: 41 U/L (ref 38–126)
Anion gap: 11 (ref 5–15)
BUN: 12 mg/dL (ref 8–23)
CO2: 24 mmol/L (ref 22–32)
Calcium: 9.5 mg/dL (ref 8.9–10.3)
Chloride: 105 mmol/L (ref 98–111)
Creatinine, Ser: 0.89 mg/dL (ref 0.44–1.00)
GFR, Estimated: >60 mL/min (ref >60)
Glucose, Bld: 135 mg/dL — ABNORMAL HIGH (ref 70–99)
Potassium: 3.5 mmol/L (ref 3.5–5.1)
Sodium: 140 mmol/L (ref 135–145)
Total Bilirubin: 0.8 mg/dL (ref 0.3–1.2)
Total Protein: 7.9 g/dL (ref 6.5–8.1)

## 2022-02-10 LAB — RAPID URINE DRUG SCREEN, HOSP PERFORMED
Amphetamines: NOT DETECTED
Barbiturates: NOT DETECTED
Benzodiazepines: NOT DETECTED
Cocaine: NOT DETECTED
Opiates: NOT DETECTED
Tetrahydrocannabinol: NOT DETECTED

## 2022-02-10 LAB — DIFFERENTIAL
Abs Immature Granulocytes: 0.01 10*3/uL (ref 0.00–0.07)
Basophils Absolute: 0.1 10*3/uL (ref 0.0–0.1)
Basophils Relative: 1 %
Eosinophils Absolute: 0.1 10*3/uL (ref 0.0–0.5)
Eosinophils Relative: 2 %
Immature Granulocytes: 0 %
Lymphocytes Relative: 25 %
Lymphs Abs: 1.9 10*3/uL (ref 0.7–4.0)
Monocytes Absolute: 0.6 10*3/uL (ref 0.1–1.0)
Monocytes Relative: 8 %
Neutro Abs: 4.8 10*3/uL (ref 1.7–7.7)
Neutrophils Relative %: 64 %

## 2022-02-10 LAB — PROTIME-INR
INR: 1.6 — ABNORMAL HIGH (ref 0.8–1.2)
Prothrombin Time: 18.6 seconds — ABNORMAL HIGH (ref 11.4–15.2)

## 2022-02-10 LAB — I-STAT CHEM 8, ED
BUN: 14 mg/dL (ref 8–23)
Calcium, Ion: 1.01 mmol/L — ABNORMAL LOW (ref 1.15–1.40)
Chloride: 106 mmol/L (ref 98–111)
Creatinine, Ser: 0.8 mg/dL (ref 0.44–1.00)
Glucose, Bld: 152 mg/dL — ABNORMAL HIGH (ref 70–99)
HCT: 44 % (ref 36.0–46.0)
Hemoglobin: 15 g/dL (ref 12.0–15.0)
Potassium: 5.1 mmol/L (ref 3.5–5.1)
Sodium: 138 mmol/L (ref 135–145)
TCO2: 21 mmol/L — ABNORMAL LOW (ref 22–32)

## 2022-02-10 LAB — APTT: aPTT: 24 seconds (ref 24–36)

## 2022-02-10 LAB — ETHANOL: Alcohol, Ethyl (B): <10 mg/dL (ref <10)

## 2022-02-10 LAB — CBG MONITORING, ED: Glucose-Capillary: 144 mg/dL — ABNORMAL HIGH (ref 70–99)

## 2022-02-10 MED ORDER — ACETAMINOPHEN 650 MG RE SUPP: 650.0000 mg | RECTAL | Status: DC | PRN

## 2022-02-10 MED ORDER — LEVETIRACETAM IN NACL 1500 MG/100ML IV SOLN
1500.0000 mg | Freq: Once | INTRAVENOUS | Status: AC
  Administered 2022-02-10: 1500 mg via INTRAVENOUS
  Filled 2022-02-10: qty 100

## 2022-02-10 MED ORDER — ORAL CARE MOUTH RINSE: 15.0000 mL | OROMUCOSAL | Status: DC | PRN

## 2022-02-10 MED ORDER — LEVETIRACETAM IN NACL 500 MG/100ML IV SOLN
500.0000 mg | Freq: Two times a day (BID) | INTRAVENOUS | Status: DC
  Administered 2022-02-11: 500 mg via INTRAVENOUS
  Filled 2022-02-10: qty 100

## 2022-02-10 MED ORDER — LABETALOL HCL 5 MG/ML IV SOLN: 10.0000 mg | Freq: Once | INTRAVENOUS | Status: DC | PRN

## 2022-02-10 MED ORDER — PANTOPRAZOLE SODIUM 40 MG IV SOLR
40.0000 mg | Freq: Every day | INTRAVENOUS | Status: DC
  Administered 2022-02-11: 40 mg via INTRAVENOUS
  Filled 2022-02-10: qty 10

## 2022-02-10 MED ORDER — NICARDIPINE HCL IN NACL 20-0.86 MG/200ML-% IV SOLN: 0.0000 mg/h | INTRAVENOUS | Status: DC | PRN

## 2022-02-10 MED ORDER — STROKE: EARLY STAGES OF RECOVERY BOOK
Freq: Once | Status: AC
  Filled 2022-02-10: qty 1

## 2022-02-10 MED ORDER — SODIUM CHLORIDE 0.9 % IV SOLN: INTRAVENOUS | Status: DC

## 2022-02-10 MED ORDER — CHLORHEXIDINE GLUCONATE CLOTH 2 % EX PADS
6.0000 | MEDICATED_PAD | Freq: Every day | CUTANEOUS | Status: DC
  Administered 2022-02-11: 6 via TOPICAL

## 2022-02-10 MED ORDER — LEVETIRACETAM IN NACL 1000 MG/100ML IV SOLN
INTRAVENOUS | Status: AC
  Filled 2022-02-10: qty 100

## 2022-02-10 MED ORDER — IOHEXOL 350 MG/ML SOLN
100.0000 mL | Freq: Once | INTRAVENOUS | Status: AC | PRN
  Administered 2022-02-10: 100 mL via INTRAVENOUS

## 2022-02-10 MED ORDER — TENECTEPLASE FOR STROKE
0.2500 mg/kg | PACK | Freq: Once | INTRAVENOUS | Status: AC
  Administered 2022-02-10: 19 mg via INTRAVENOUS

## 2022-02-10 MED ORDER — TENECTEPLASE FOR STROKE
PACK | INTRAVENOUS | Status: AC
  Filled 2022-02-10: qty 10

## 2022-02-10 MED ORDER — ACETAMINOPHEN 325 MG PO TABS: 650.0000 mg | ORAL_TABLET | ORAL | Status: DC | PRN

## 2022-02-10 MED ORDER — ACETAMINOPHEN 160 MG/5ML PO SOLN: 650.0000 mg | ORAL | Status: DC | PRN

## 2022-02-10 MED ORDER — SODIUM CHLORIDE 0.9 % IV SOLN
250.0000 mg | Freq: Two times a day (BID) | INTRAVENOUS | Status: DC
  Filled 2022-02-10 (×2): qty 2.5

## 2022-02-10 NOTE — ED Triage Notes (Signed)
Patient BIB EMS with reports of AMS that started around 1245. Husband reports that he went to the grocery store and came back when he found patient on floor in living room. States that she held her arms up, locked them into place, became very stiff and started foaming at the mouth. Patient with hx of CVA and deficits that are unclearly reported at this time. Currently takes Coumadin for A-fib with last dose taken this morning. EMS encoded possible code stroke, did not confirm PTA. Patient is able to answer some questions on arrival to ED after EMS report that she has been non-verbal. Patient has urinated on self.

## 2022-02-10 NOTE — ED Notes (Signed)
Swallow screen not completed due to MD stating to leave NPO.

## 2022-02-10 NOTE — ED Notes (Signed)
Neurologist on screen at this time.

## 2022-02-10 NOTE — ED Notes (Signed)
1425-NS 24m flush given at this time. 1425-TNK administered.  1426-NS 126mflush given at this time.

## 2022-02-10 NOTE — Progress Notes (Signed)
1317 Call time 1320 beeper time 1331 exam started 1335 exam finished 1335 images sent to [place] exam completed in epic 1338 [facility] radiology called

## 2022-02-10 NOTE — Progress Notes (Addendum)
1319 stroke cart activated and elert sent to [facility]. EDP at bedside assessing pt in stroke assessment area at this time. Pt presents with L sided weakness, R gaze with L sided neglect, and aphasia. Pt was LKW '@1245'$  per husband's report with mRS 1. 1327 pt to CT 1336 [facility] paged and pt back from CT  1341 [facility] connected via stroke cart and assessing pt at this time.  49 [facility] notified of NCCT results 1358 pt back to CT for advance images per [facility]'s request.  0698 pt back from CT 1419 offering thrombolytic and reviewing risk/benefits with pt and family 1425 Time out performed with [facility], primary RN, and [facility].  1425 initial 78m saline flush administered 1426 TNK administer ('19mg'$ /3.896m 1426 second 1046maline flush administered

## 2022-02-10 NOTE — Consult Note (Addendum)
NEUROLOGY TELECONSULTATION NOTE   Date of service: February 10, 2022 Patient Name: Anne Wolf MRN:  [id number] DOB:  16-Jun-1933 Reason for consult: telestroke  Requesting Provider: Dr. Noemi Chapel Consult Participants: myself, patient, bedside RN, telestroke RN Location of the provider: [facility] Location of the patient: [facility]  This consult was provided via telemedicine with 2-way video and audio communication. The patient/family was informed that care would be provided in this way and agreed to receive care in this manner.   _ _ _   _ __   _ __ _ _  __ __   _ __   __ _  History of Present Illness   This is an 88 year old woman with past medical history significant for aortic stenosis, atrial fibrillation on warfarin compliant, CHF, diabetes, hyperlipidemia, prior right basal ganglia stroke who presents after being found having a seizure by husband.  Last known well was shortly after 12 PM when he went to the store.  When he returned he found her sitting in a chair altered unresponsive, with limbs stiff, foaming from the mouth, incontinent.  She had bitten her tongue.  She did not fall on the ground.  No known head trauma.  EMS reported that she had left upper extremity weakness and neglect.  On my examination she had a R gaze preference, L sided neglect, and LLE>RLE weakness. She remained confused. She has never had a seizure before. CT head showed no acute findings, chronic right basal ganglia infarct, ASPECTS of 10. CT head was reviewed prior to TNK adminisration.   TNK was initially not administered 2/2 contraindication of being on warfarin and therefore there was a delay of having to wait for INR. INR resulted at 1.6. Risks, benefits, and alternatives to TNK were discussed with husband and son at bedside and they gave informed consent to proceed. CTA showed no LVO.  CNS imaging personally reviewed and discussed with radiology by phone.    ROS   UTA 2/2 mental status  Past History    The following was personally reviewed:  Past Medical History:  Diagnosis Date   Aortic stenosis    mild; mild MR; slightly increased pulmonary artery pressure with mild RVH; normal LV-2010   Arthritis    Atrial fibrillation (HCC)    chronic anticoagulation; adequate HR control on minimal AV nodal blocking medication    Cancer ([place])    skin cancers removed from face   CHF (congestive heart failure) ([place])    Diabetes mellitus without complication ([place])    2   Dyspnea    Fasting hyperglycemia    + microalbuminuria; A1c of 6.5% in 11/2010   Hematochezia 01/2009   01/2009-presumed ischemic colitis; history of diverticulosis   History of kidney stones    Hx of adenomatous colonic polyps 2005   adenomatous polyp; negative colonoscopy in 2009   Hyperlipidemia    Stroke [facility])    Past Surgical History:  Procedure Laterality Date   CATARACT EXTRACTION W/PHACO Right 03/23/2014   Procedure: CATARACT EXTRACTION PHACO AND INTRAOCULAR LENS PLACEMENT ([place]);  Surgeon: Williams Che, MD;  Location: [facility];  Service: Ophthalmology;  Laterality: Right;  CDE:2.83   CATARACT EXTRACTION W/PHACO Left 07/27/2014   Procedure: CATARACT EXTRACTION PHACO AND INTRAOCULAR LENS PLACEMENT LEFT EYE CDE=6.97;  Surgeon: Williams Che, MD;  Location: [facility];  Service: Ophthalmology;  Laterality: Left;   COLONOSCOPY  2009   negative; prior study with adenomatous polyp   CYSTOSCOPY/URETEROSCOPY/HOLMIUM LASER/STENT PLACEMENT Right 08/21/2016  Procedure: CYSTOSCOPY/URETEROSCOPY/RETROGRADE PYELOGRAM//STENT PLACEMENT;  Surgeon: Nickie Retort, MD;  Location: [facility];  Service: Urology;  Laterality: Right;   EYE SURGERY     INTRAOPERATIVE TRANSTHORACIC ECHOCARDIOGRAM N/A 05/31/2021   Procedure: INTRAOPERATIVE TRANSTHORACIC ECHOCARDIOGRAM;  Surgeon: Sherren Mocha, MD;  Location: [place];  Service: Open Heart Surgery;  Laterality: N/A;   RIGHT/LEFT HEART CATH AND CORONARY ANGIOGRAPHY N/A 02/04/2021   Procedure:  RIGHT/LEFT HEART CATH AND CORONARY ANGIOGRAPHY;  Surgeon: Sherren Mocha, MD;  Location: [place] [facility];  Service: Cardiovascular;  Laterality: N/A;   TRANSCATHETER AORTIC VALVE REPLACEMENT, TRANSFEMORAL N/A 05/31/2021   Procedure: Transcatheter Aortic Valve Replacement 36m, Transfemoral;  Surgeon: CSherren Mocha MD;  Location: [place]  Service: Open Heart Surgery;  Laterality: N/A;  Percutaneous   Family History  Problem Relation Age of Onset   Cancer Father    Arrhythmia Sister        Atrial fibrillation   Social History   Socioeconomic History   Marital status: Married    Spouse name: Not on file   Number of children:  2   Years of education: Not on file   Highest education level: Not on file  Occupational History   Occupation: Retired  Tobacco Use   Smoking status: Never    Passive exposure: Never   Smokeless tobacco: Never  Vaping Use   Vaping Use: Never used  Substance and Sexual Activity   Alcohol use: No   Drug use: No   Sexual activity: Not Currently  Other Topics Concern   Not on file  Social History Narrative   Married with 2 children   No regular exercise   Social Determinants of Health   Financial Resource Strain: Not on file  Food Insecurity: Not on file  Transportation Needs: Not on file  Physical Activity: Not on file  Stress: Not on file  Social Connections: Not on file   Allergies  Allergen Reactions   Cat Hair Extract Other (See Comments)    Red eyes and Sneezing Also allergic to Dogs    Medications   (Not in a hospital admission)    No current facility-administered medications for this encounter.  Current Outpatient Medications:    alendronate (FOSAMAX) 70 MG tablet, Take 70 mg by mouth once a week., Disp: , Rfl:    Biotin 5000 MCG CAPS, Take 5,000 mcg by mouth daily., Disp: , Rfl:    calcium carbonate (OSCAL) 1500 (600 Ca) MG TABS tablet, Take 600 mg of elemental calcium by mouth daily., Disp: , Rfl:    cephALEXin (KEFLEX) 500  MG capsule, Take 1 capsule (500 mg total) by mouth daily. Begin after completing Cefdinir, Disp: 30 capsule, Rfl: 2   cholecalciferol (VITAMIN D) 1000 units tablet, Take 1,000 Units by mouth at bedtime., Disp: , Rfl:    Co-Enzyme Q-10 100 MG CAPS, Take 100 mg by mouth at bedtime., Disp: , Rfl:    ferrous sulfate 325 (65 FE) MG tablet, Take 325 mg by mouth daily with breakfast., Disp: , Rfl:    lisinopril (PRINIVIL,ZESTRIL) 10 MG tablet, Take 10 mg by mouth at bedtime., Disp: , Rfl:    loratadine (CLARITIN) 10 MG tablet, Take 10 mg by mouth at bedtime., Disp: , Rfl:    metFORMIN (GLUCOPHAGE) 500 MG tablet, Take 500 mg by mouth 2 (two) times daily with a meal. , Disp: , Rfl:    metoprolol succinate (TOPROL-XL) 25 MG 24 hr tablet, Take 25 mg by mouth at bedtime., Disp: , Rfl:  mirabegron ER (MYRBETRIQ) 25 MG TB24 tablet, Take 1 tablet (25 mg total) by mouth daily., Disp: 30 tablet, Rfl: 0   Multiple Vitamins-Minerals (CENTRUM SILVER) tablet, Take 1 tablet by mouth at bedtime. , Disp: , Rfl:    simvastatin (ZOCOR) 40 MG tablet, Take 40 mg by mouth at bedtime.  , Disp: , Rfl:    warfarin (COUMADIN) 2 MG tablet, Take 1 tablet daily except take 1/2 tablet on Sundays and Wednesdays or as directed., Disp: 90 tablet, Rfl: 1  Vitals   Vitals:   02/10/22 1339 02/10/22 1340  Pulse:  (!) 107  Resp:  (!) 22  Temp: 98.1 F (36.7 C)   SpO2:  95%     There is no height or weight on file to calculate BMI.  Physical Exam   Exam performed over telemedicine with 2-way video and audio communication and with assistance of bedside RN  Physical Exam Gen: oriented to self, hospital, and family at bedside but not month or age Resp: normal WOB CV: extremities appear well-perfused  Neuro: *MS: oriented to self, hospital, and family at bedside but not month or age, follows some but not all simple commands *Speech: nondysarthric, able to name 5 of 6 objects and repeat *CN: PERRL 39m, R gaze preference  initially would not cross midline to the left but does on repeat exam, blinks to threat R eye only, sensation intact, smile symmetric, hearing intact to voice *Motor:   Normal bulk.  No tremor, rigidity or bradykinesia. BUE no drift symmetric. LLE drifts to bed, RUE mild drift but not to bed. *Sensory: SILT. Symmetric. L visual neglect, hemineglect of LUE *Coordination:  FNF intact on R *Reflexes:  UTA 2/2 tele-exam *Gait: deferred  NIHSS  1a Level of Conscious.: 0 1b LOC Questions: 2 1c LOC Commands: 1 2 Best Gaze: 1 3 Visual: 1 4 Facial Palsy: 0 5a Motor Arm - left: 0 5b Motor Arm - Right: 0 6a Motor Leg - Left: 2 6b Motor Leg - Right: 1 7 Limb Ataxia: 0 8 Sensory: 0 9 Best Language: 1 10 Dysarthria: 0 11 Extinct. and Inatten.: 2  TOTAL: 11  Premorbid mRS = 2   Labs   CBC:  Recent Labs  Lab 02/10/22 1327  HGB 15.0  HCT 437.8   Basic Metabolic Panel:  Lab Results  Component Value Date   NA 138 02/10/2022   K 5.1 02/10/2022   CO2 28 10/21/2021   GLUCOSE 152 (H) 02/10/2022   BUN 14 02/10/2022   CREATININE 0.80 02/10/2022   CALCIUM 9.4 10/21/2021   GFRNONAA >60 10/21/2021   GFRAA >60 11/28/2017   Lipid Panel:  Lab Results  Component Value Date   LDLCALC 86 02/18/2021   HgbA1c:  Lab Results  Component Value Date   HGBA1C 5.7 (H) 02/18/2021   Urine Drug Screen:     Component Value Date/Time   LABOPIA NONE DETECTED 02/18/2021 0500   COCAINSCRNUR NONE DETECTED 02/18/2021 0500   LABBENZ NONE DETECTED 02/18/2021 0500   AMPHETMU NONE DETECTED 02/18/2021 0500   THCU NONE DETECTED 02/18/2021 0500   LABBARB NONE DETECTED 02/18/2021 0500    Alcohol Level     Component [facility] Date/Time   [facility] 02/17/2021 2252     Impression   This is an 88year old woman with past medical history significant for aortic stenosis, atrial fibrillation on warfarin compliant, CHF, diabetes, hyperlipidemia, prior right basal ganglia stroke who presents after being found  having a generalized seizure by husband.  She did not fall or hit or head. CT head unremarkable. NIHSS = 10. On my exam she had R gaze preference, L sided weakness, and L sided hemineglect. The direction of her gaze preference after she stopped seizing was concerning for acute infarct R MCA. She is on warfarin but INR = 1.6 therefore TNK was administered after receiving informed consent from family, see HPI. CTA showed no LVO.  She has a known history of stroke (R basal ganglia 2022) and likely another acute ischemic infarct today, therefore she was loaded with keppra and started on maintenance dosing after this first time seizure.  Recommendations   - Transfer to 4N neuro ICU at [facility] under Dr. Lorrin Goodell - Neurochecks and NIHSS documentation per post-tNK protocol - STAT head CT for any change in neurologic exam - Non-con head CT 24 hrs post-tNK r/o hemorrhagic conversion - MRI brain wo contrast - TTE - no aspirin for 24 hours post IV t-NK and until ICH ruled out by a head CT - Hold warfarin - keep SBP less than 180/105 for the 1st 24 hours post IV t-NK to reduce the risk of hemorrhagic transformation - SCDs for DVT prophylaxis; can start SQ Heparin if head CT 24 hours post IV tNK is negative for ICH - NPO; swallow study - PT/OT and speech therapy - stroke education - outpatient f/u with neurology after discharge - S/p keppra '20mg'$ /kg load, cont '250mg'$  q 12 (renal dosing)  D/w Dr. Noemi Chapel  This patient is critically ill and at significant risk of neurological worsening, death and care requires constant monitoring of vital signs, hemodynamics,respiratory and cardiac monitoring, neurological assessment, discussion with family, other specialists and medical decision making of high complexity. I spent 70 minutes of neurocritical care time  in the care of  this patient. This was time spent independent of any time provided by nurse practitioner or PA.  Su Monks, MD [facility] [phone]  If 7pm- 7am, please page neurology on call as listed in [place].

## 2022-02-10 NOTE — H&P (Signed)
Neurology H&P  CC: Seizure  History is obtained from: Patient, husband  HPI: Anne Wolf is a 88 y.o. female with a history of atrial fibrillation on Coumadin, diabetes, hyperlipidemia, previous stroke who presents with seizure.  She was last seen well shortly before her husband went to the store around noon.  On his return, he found her to be sitting at the kitchen table, stating over and over "he told me not to speak" shows not clear who he is talking about.  He states that she seemed "in a daze."  He thought his son might of caught her so he stepped out to use the phone and called his son and asked if he had told her not to talk about something, and when he returned to the room, he found her to be convulsing with arms outstretched.  He states that she shook for somewhere between 15 to 20 minutes and 911 was called.  She was transported to the emergency department and on arrival had left hemiplegia with rightward gaze and a code stroke was activated.  Head CT was negative and her INR was subtherapeutic at 1.6 and therefore TNK was discussed and administered to the patient.  Of note she was previously on Eliquis, but had recurrent nosebleeds and therefore transitioned to warfarin.    LKW: Noon tnk given?:  Yes IR Thrombectomy? No, no LVO Modified Rankin Scale: 2-Slight disability-UNABLE to perform all activities but does not need assistance NIHSS: 0   Past Medical History:  Diagnosis Date   Aortic stenosis    mild; mild MR; slightly increased pulmonary artery pressure with mild RVH; normal LV-2010   Arthritis    Atrial fibrillation (HCC)    chronic anticoagulation; adequate HR control on minimal AV nodal blocking medication    Cancer ([place])    skin cancers removed from face   CHF (congestive heart failure) ([place])    Diabetes mellitus without complication ([place])    2   Dyspnea    Fasting hyperglycemia    + microalbuminuria; A1c of 6.5% in 11/2010   Hematochezia 01/2009    01/2009-presumed ischemic colitis; history of diverticulosis   History of kidney stones    Hx of adenomatous colonic polyps 2005   adenomatous polyp; negative colonoscopy in 2009   Hyperlipidemia    Stroke [facility])      Family History  Problem Relation Age of Onset   Cancer Father    Arrhythmia Sister        Atrial fibrillation     Social History:  reports that she has never smoked. She has never been exposed to tobacco smoke. She has never used smokeless tobacco. She reports that she does not drink alcohol and does not use drugs.   Prior to Admission medications   Medication Sig Start Date End Date Taking? Authorizing Provider  alendronate (FOSAMAX) 70 MG tablet Take 70 mg by mouth once a week.   Yes [provider]  Biotin 5000 MCG CAPS Take 10,000 mcg by mouth daily.   Yes [provider]  cholecalciferol (VITAMIN D) 1000 units tablet Take 5,000 Units by mouth at bedtime.   Yes [provider]  lisinopril (PRINIVIL,ZESTRIL) 10 MG tablet Take 10 mg by mouth at bedtime.   Yes [provider]  metFORMIN (GLUCOPHAGE) 500 MG tablet Take 500 mg by mouth 2 (two) times daily with a meal.    Yes [provider]  metoprolol succinate (TOPROL-XL) 25 MG 24 hr tablet Take 25 mg by  mouth at bedtime.   Yes [provider]  mirabegron ER (MYRBETRIQ) 25 MG TB24 tablet Take 1 tablet (25 mg total) by mouth daily. 01/24/22  Yes McKenzie, Candee Furbish, MD  Multiple Vitamins-Minerals (CENTRUM SILVER) tablet Take 1 tablet by mouth at bedtime.    Yes [provider]  simvastatin (ZOCOR) 40 MG tablet Take 40 mg by mouth at bedtime.     Yes [provider]  warfarin (COUMADIN) 2 MG tablet Take 1 tablet daily except take 1/2 tablet on Sundays and Wednesdays or as directed. 02/01/22  Yes Branch, Alphonse Guild, MD  cephALEXin (KEFLEX) 500 MG capsule Take 1 capsule (500 mg total) by mouth daily. Begin after completing Cefdinir Patient not taking:  Reported on 02/10/2022 12/13/21   Primus Bravo., MD     Exam: Current vital signs: BP 118/74   Pulse 86   Temp 98.2 F (36.8 C) (Oral)   Resp (!) 26   Wt 74.2 kg   SpO2 96%   BMI 31.95 kg/m    Physical Exam  Constitutional: Appears well-developed and well-nourished.  Psych: Affect appropriate to situation Eyes: No scleral injection HENT: No OP obstrucion Head: Normocephalic.  Cardiovascular: Normal rate and regular rhythm.  Respiratory: Effort normal and breath sounds normal to anterior ascultation GI: Soft.  No distension. There is no tenderness.  Skin: WDI  Neuro: Mental Status: Patient is awake, alert, oriented to person, place, month, gives the year is "N[age][birth year] [age]..."  and then trails off, when asked to clarify and say I mean what year is it now she repeats this.  She gives her age as 79. Patient is able to give a clear and coherent history. No signs of aphasia or neglect Cranial Nerves: II: Visual Fields are full. Pupils are equal, round, and reactive to light.   III,IV, VI: EOMI without ptosis or diploplia.  V: Facial sensation is symmetric to temperature VII: Facial movement is symmetric.  VIII: hearing is intact to voice X: Uvula elevates symmetrically XI: Shoulder shrug is symmetric. XII: tongue is midline without atrophy or fasciculations.  Motor: Tone is normal. Bulk is normal. 5/5 strength was present in all four extremities.  Sensory: Sensation is symmetric to light touch and temperature in the arms and legs.  She does endorse decreased sensation on the right arm and right leg each one time, but this is not consistent on repeated checks that she endorses they are symmetric throughout. Cerebellar: Does not perform  I have reviewed labs in epic and the pertinent results are: CMP-unremarkable  I have reviewed the images obtained: CTA-negative  Primary Diagnosis:  Cerebral infarction, unspecified.  Secondary Diagnosis: Chronic atrial  fibrillation and Type 2 diabetes mellitus w/o complications   Impression: 88 year old female with new onset seizure with postictal left hemiparesis and rightward gaze.  Given there was concern for persistent deficits after seizure, tenecteplase was administered.  With her continued improvement, suspect that this was likely postictal Todd's, but seizure at stroke onset with improvement after TNK is possible. She will need further workup.   Plan: - HgbA1c, fasting lipid panel - MRI of the brain without contrast - Frequent neuro checks - Echocardiogram - Prophylactic therapy-none for 24 hours.  - Telemetry monitoring - PT consult, OT consult, Speech consult - SSI for DM - EEG - Stroke team to follow   Roland Rack, MD [facility] [phone]  If 7pm- 7am, please page neurology on call as listed in [place].

## 2022-02-10 NOTE — ED Notes (Signed)
Neurologist on screen now.

## 2022-02-10 NOTE — ED Provider Notes (Signed)
[facility] EMERGENCY DEPARTMENT Provider Note   CSN: [id number] Arrival date & time: 02/10/22  1320  An emergency department physician performed an initial assessment on this suspected stroke patient at 1327.  History  Chief Complaint  Patient presents with   Code Stroke    Anne Wolf is a 88 y.o. female.  HPI   88 y/o female - hx of possible prior stroke, no hx of seizure - presents by EMS after was seen to have seizure like activity by husband around 12:45 PM - frothing at mouth as well as both arms raised in the area and shaking.  She was then unresponsive and seemed to not be paying attention to him on the left side.  Eyes deviated to the right.  Paramedics were called.  They transported the patient with acute altered mental status not recognizing focal neurologic deficits.  They did notice that she had bitten her tongue  Home Medications Prior to Admission medications   Medication Sig Start Date End Date Taking? Authorizing Provider  alendronate (FOSAMAX) 70 MG tablet Take 70 mg by mouth once a week.    [provider]  Biotin 5000 MCG CAPS Take 5,000 mcg by mouth daily.    [provider]  calcium carbonate (OSCAL) 1500 (600 Ca) MG TABS tablet Take 600 mg of elemental calcium by mouth daily.    [provider]  cephALEXin (KEFLEX) 500 MG capsule Take 1 capsule (500 mg total) by mouth daily. Begin after completing Cefdinir 12/13/21   Stoneking, Reece Leader., MD  cholecalciferol (VITAMIN D) 1000 units tablet Take 1,000 Units by mouth at bedtime.    [provider]  Co-Enzyme Q-10 100 MG CAPS Take 100 mg by mouth at bedtime.    [provider]  ferrous sulfate 325 (65 FE) MG tablet Take 325 mg by mouth daily with breakfast.    [provider]  lisinopril (PRINIVIL,ZESTRIL) 10 MG tablet Take 10 mg by mouth at bedtime.    [provider]  loratadine (CLARITIN) 10 MG tablet Take 10 mg by mouth at bedtime.     [provider]  metFORMIN (GLUCOPHAGE) 500 MG tablet Take 500 mg by mouth 2 (two) times daily with a meal.     [provider]  metoprolol succinate (TOPROL-XL) 25 MG 24 hr tablet Take 25 mg by mouth at bedtime.    [provider]  mirabegron ER (MYRBETRIQ) 25 MG TB24 tablet Take 1 tablet (25 mg total) by mouth daily. 01/24/22   McKenzie, Candee Furbish, MD  Multiple Vitamins-Minerals (CENTRUM SILVER) tablet Take 1 tablet by mouth at bedtime.     [provider]  simvastatin (ZOCOR) 40 MG tablet Take 40 mg by mouth at bedtime.      [provider]  warfarin (COUMADIN) 2 MG tablet Take 1 tablet daily except take 1/2 tablet on Sundays and Wednesdays or as directed. 02/01/22   Arnoldo Lenis, MD      Allergies    Cat hair extract    Review of Systems   Review of Systems  All other systems reviewed and are negative.   Physical Exam Updated Vital Signs BP (!) 110/59   Pulse (!) 114   Temp 98.1 F (36.7 C)   Resp 17   Wt 74.2 kg   SpO2 96%   BMI 31.95 kg/m  Physical Exam Vitals and nursing note reviewed.  Constitutional:      General: She is not in acute distress.  Appearance: She is well-developed.  HENT:     Head: Normocephalic and atraumatic.     Nose: No congestion or rhinorrhea.     Mouth/Throat:     Pharynx: No oropharyngeal exudate.     Comments: Tongue biting Eyes:     General: No scleral icterus.       Right eye: No discharge.        Left eye: No discharge.     Conjunctiva/sclera: Conjunctivae normal.     Pupils: Pupils are equal, round, and reactive to light.  Neck:     Thyroid: No thyromegaly.     Vascular: No JVD.  Cardiovascular:     Rate and Rhythm: Tachycardia present. Rhythm irregular.     Heart sounds: Normal heart sounds. No murmur heard.    No friction rub. No gallop.     Comments: Atrial fibrillation, mild tachycardia Pulmonary:     Effort: Pulmonary effort is normal. No respiratory distress.     Breath  sounds: Normal breath sounds. No wheezing or rales.  Abdominal:     General: Bowel sounds are normal. There is no distension.     Palpations: Abdomen is soft. There is no mass.     Tenderness: There is no abdominal tenderness.  Genitourinary:    Comments: Some urinary incontinence Musculoskeletal:        General: No tenderness. Normal range of motion.     Cervical back: Normal range of motion and neck supple.     Right lower leg: No edema.     Left lower leg: No edema.  Lymphadenopathy:     Cervical: No cervical adenopathy.  Skin:    General: Skin is warm and dry.     Findings: No erythema or rash.  Neurological:     Mental Status: She is alert.     Coordination: Coordination normal.     Comments: Right-sided gaze preference  Left-sided weakness of arm and leg, drift present  Psychiatric:        Behavior: Behavior normal.     ED Results / Procedures / Treatments   Labs (all labs ordered are listed, but only abnormal results are displayed) Labs Reviewed  PROTIME-INR - Abnormal; Notable for the following components:      Result Value   Prothrombin Time 18.6 (*)    INR 1.6 (*)    All other components within normal limits  COMPREHENSIVE METABOLIC PANEL - Abnormal; Notable for the following components:   Glucose, Bld 135 (*)    All other components within normal limits  CBG MONITORING, ED - Abnormal; Notable for the following components:   Glucose-Capillary 144 (*)    All other components within normal limits  I-STAT CHEM 8, ED - Abnormal; Notable for the following components:   Glucose, Bld 152 (*)    Calcium, Ion 1.01 (*)    TCO2 21 (*)    All other components within normal limits  APTT  CBC  DIFFERENTIAL  ETHANOL  RAPID URINE DRUG SCREEN, HOSP PERFORMED  URINALYSIS, ROUTINE W REFLEX MICROSCOPIC  I-STAT CHEM 8, ED    EKG None  Radiology CT HEAD CODE STROKE WO CONTRAST  Result Date: 02/10/2022 CLINICAL DATA:  Code stroke.  Neuro deficit, acute, stroke  suspected EXAM: CT HEAD WITHOUT CONTRAST TECHNIQUE: Contiguous axial images were obtained from the base of the skull through the vertex without intravenous contrast. RADIATION DOSE REDUCTION: This exam was performed according to the departmental dose-optimization program which includes automated exposure control, adjustment of the  mA and/or kV according to patient size and/or use of iterative reconstruction technique. COMPARISON:  CT head 10/21/2021. FINDINGS: Brain: Similar remote perforator infarct in the right basal ganglia. No evidence of acute large vascular territory infarct, acute hemorrhage, mass lesion, midline shift or hydrocephalus. Vascular: No hyperdense vessel is identified. Skull: No acute fracture. Sinuses/Orbits: No acute findings. ASPECTS [facility] Stroke Program Early CT Score) total score (0-10 with 10 being normal): 10 IMPRESSION: 1. No evidence of acute intracranial abnormality. ASPECTS is 10. 2. Remote right basal ganglia perforator infarct. Electronically Signed   By: Margaretha Sheffield M.D.   On: 02/10/2022 13:49    Procedures Procedures    Medications Ordered in ED Medications  tenecteplase (TNKASE) 50 MG injection for Stroke (has no administration in time range)  iohexol (OMNIPAQUE) 350 MG/ML injection 100 mL (100 mLs Intravenous Contrast Given 02/10/22 1404)  tenecteplase (TNKASE) injection for Stroke 19 mg (19 mg Intravenous Given 02/10/22 1425)    ED Course/ Medical Decision Making/ A&P                           Medical Decision Making Amount and/or Complexity of Data Reviewed Labs: ordered. Radiology: ordered.  Risk Decision regarding hospitalization.   This patient has what appears to be an acute stroke or seizure with Todd's paralysis, may be intracranial hemorrhage, the patient is complicated and acutely ill.  This patient presents to the ED for concern of acute ischemic stroke, this involves an extensive number of treatment options, and is a complaint that  carries with it a high risk of complications and morbidity.  The differential diagnosis includes possible stroke, possible seizure, possible hemorrhage   Co morbidities that complicate the patient evaluation  Elderly On Coumadin for atrial fibrillation   Additional history obtained:  Additional history obtained from family members, chart, husband, son External records from outside source obtained and reviewed including , prior valve replacement placement, A-fib, multiple office visits, no recent admissions since April when he was admitted and had a TAVR   Lab Tests:  I Ordered, and personally interpreted labs.  The pertinent results include: No leukocytosis, no anemia, INR of 1.6, metabolic panel with normal creatinine, liver function test are normal, glucose is slightly elevated at 140   Imaging Studies ordered:  I ordered imaging studies including CT scan of the head without contrast I independently visualized and interpreted imaging which showed no acute hemorrhage, CT angiograms ordered I agree with the radiologist interpretation   Cardiac Monitoring: / EKG:  The patient was maintained on a cardiac monitor.  I personally viewed and interpreted the cardiac monitored which showed an underlying rhythm of: Atrial fibrillation   Consultations Obtained:  I requested consultation with the neurologist Dr. Quinn Axe,  and discussed lab and imaging findings as well as pertinent plan - they recommend: Admission to ICU and TNK therapy as a thrombolytic   Problem List / ED Course / Critical interventions / Medication management  Patient is clear with what appears to be a new ischemic stroke, after discussion with neurology they believe that the seizure activity was not primarily seizure but more likely related to acute stroke.  This patient will need higher level of care and ICU admission, the neurologist feel strongly about giving TNK which will be given at this time.  They have consulted  with family and given them the appropriate risk benefits and alternatives, they have agreed to proceed   Social Determinants of Health:  Acutely severely ill with stroke, cannot communicate   Test / Admission - Considered:  Will need admission to ICU         Final Clinical Impression(s) / ED Diagnoses Final diagnoses:  Acute ischemic stroke [facility])      Noemi Chapel, MD 02/10/22 1442

## 2022-02-10 NOTE — ED Notes (Signed)
Beeped out CODE STROKE @ 13:20.

## 2022-02-10 NOTE — ED Notes (Signed)
Patient taken to CT for angio at this time.

## 2022-02-10 NOTE — ED Notes (Signed)
Patient returned to room from CT at this time.

## 2022-02-10 NOTE — ED Notes (Signed)
Son in room states that patient did not fall as previously reported. States that she was in chair, found in chair with reported symptoms.

## 2022-02-11 ENCOUNTER — Inpatient Hospital Stay (HOSPITAL_COMMUNITY): Payer: Medicare HMO

## 2022-02-11 DIAGNOSIS — I482 Chronic atrial fibrillation, unspecified: Secondary | ICD-10-CM

## 2022-02-11 DIAGNOSIS — I6389 Other cerebral infarction: Secondary | ICD-10-CM

## 2022-02-11 DIAGNOSIS — R569 Unspecified convulsions: Secondary | ICD-10-CM | POA: Diagnosis not present

## 2022-02-11 LAB — BASIC METABOLIC PANEL
Anion gap: 8 (ref 5–15)
BUN: 8 mg/dL (ref 8–23)
CO2: 23 mmol/L (ref 22–32)
Calcium: 8.6 mg/dL — ABNORMAL LOW (ref 8.9–10.3)
Chloride: 108 mmol/L (ref 98–111)
Creatinine, Ser: 0.79 mg/dL (ref 0.44–1.00)
GFR, Estimated: >60 mL/min (ref >60)
Glucose, Bld: 125 mg/dL — ABNORMAL HIGH (ref 70–99)
Potassium: 3.7 mmol/L (ref 3.5–5.1)
Sodium: 139 mmol/L (ref 135–145)

## 2022-02-11 LAB — ECHOCARDIOGRAM COMPLETE
AR max vel: 2.32 cm2
AV Area VTI: 2.42 cm2
AV Area mean vel: 2.43 cm2
AV Mean grad: 7 mmHg
AV Peak grad: 12.7 mmHg
Ao pk vel: 1.78 m/s
MV VTI: 1.65 cm2
S' Lateral: 1.8 cm
Weight: 2617.3 oz

## 2022-02-11 LAB — CBC
HCT: 38.8 % (ref 36.0–46.0)
Hemoglobin: 12.6 g/dL (ref 12.0–15.0)
MCH: 29.6 pg (ref 26.0–34.0)
MCHC: 32.5 g/dL (ref 30.0–36.0)
MCV: 91.1 fL (ref 80.0–100.0)
Platelets: 148 10*3/uL — ABNORMAL LOW (ref 150–400)
RBC: 4.26 MIL/uL (ref 3.87–5.11)
RDW: 13.9 % (ref 11.5–15.5)
WBC: 7.7 10*3/uL (ref 4.0–10.5)
nRBC: 0 % (ref 0.0–0.2)

## 2022-02-11 LAB — LIPID PANEL
Cholesterol: 183 mg/dL (ref 0–200)
HDL: 58 mg/dL (ref >40)
LDL Cholesterol: 112 mg/dL — ABNORMAL HIGH (ref 0–99)
Total CHOL/HDL Ratio: 3.2 RATIO
Triglycerides: 64 mg/dL (ref <150)
VLDL: 13 mg/dL (ref 0–40)

## 2022-02-11 LAB — GLUCOSE, CAPILLARY
Glucose-Capillary: 115 mg/dL — ABNORMAL HIGH (ref 70–99)
Glucose-Capillary: 123 mg/dL — ABNORMAL HIGH (ref 70–99)
Glucose-Capillary: 131 mg/dL — ABNORMAL HIGH (ref 70–99)
Glucose-Capillary: 137 mg/dL — ABNORMAL HIGH (ref 70–99)
Glucose-Capillary: 138 mg/dL — ABNORMAL HIGH (ref 70–99)

## 2022-02-11 MED ORDER — ROSUVASTATIN CALCIUM 20 MG PO TABS
20.0000 mg | ORAL_TABLET | Freq: Every day | ORAL | Status: DC
  Administered 2022-02-11 – 2022-02-12 (×2): 20 mg via ORAL
  Filled 2022-02-11 (×2): qty 1

## 2022-02-11 MED ORDER — WARFARIN - PHARMACIST DOSING INPATIENT: Freq: Every day | Status: DC

## 2022-02-11 MED ORDER — PANTOPRAZOLE SODIUM 40 MG PO TBEC
40.0000 mg | DELAYED_RELEASE_TABLET | Freq: Every day | ORAL | Status: DC
  Administered 2022-02-11 – 2022-02-12 (×2): 40 mg via ORAL
  Filled 2022-02-11 (×2): qty 1

## 2022-02-11 MED ORDER — WARFARIN SODIUM 2 MG PO TABS
2.0000 mg | ORAL_TABLET | Freq: Once | ORAL | Status: DC
  Filled 2022-02-11: qty 1

## 2022-02-11 MED ORDER — ASPIRIN 325 MG PO TBEC
325.0000 mg | DELAYED_RELEASE_TABLET | Freq: Every day | ORAL | Status: DC
  Filled 2022-02-11: qty 1

## 2022-02-11 MED ORDER — INSULIN ASPART 100 UNIT/ML IJ SOLN
0.0000 [IU] | Freq: Three times a day (TID) | INTRAMUSCULAR | Status: DC
  Administered 2022-02-11 – 2022-02-12 (×3): 2 [IU] via SUBCUTANEOUS

## 2022-02-11 MED ORDER — ENOXAPARIN SODIUM 40 MG/0.4ML IJ SOSY: 40.0000 mg | PREFILLED_SYRINGE | INTRAMUSCULAR | Status: DC

## 2022-02-11 MED ORDER — APIXABAN 5 MG PO TABS
5.0000 mg | ORAL_TABLET | Freq: Two times a day (BID) | ORAL | Status: DC
  Administered 2022-02-11 – 2022-02-12 (×2): 5 mg via ORAL
  Filled 2022-02-11 (×2): qty 1

## 2022-02-11 MED ORDER — LEVETIRACETAM 500 MG PO TABS
500.0000 mg | ORAL_TABLET | Freq: Two times a day (BID) | ORAL | Status: DC
  Administered 2022-02-11 – 2022-02-12 (×2): 500 mg via ORAL
  Filled 2022-02-11 (×2): qty 1

## 2022-02-11 NOTE — Progress Notes (Signed)
ANTICOAGULATION CONSULT NOTE - Initial Consult  Pharmacy Consult for Eliquis Indication: atrial fibrillation  Allergies  Allergen Reactions   Cat Hair Extract Other (See Comments)    Red eyes and Sneezing Also allergic to Dogs    Patient Measurements: Weight: 74.2 kg (163 lb 9.3 oz)   Vital Signs: Temp: 98.1 F (36.7 C) (12/23 1200) Temp Source: Axillary (12/23 1200) BP: 122/69 (12/23 1600) Pulse Rate: 82 (12/23 1600)  Labs: Recent Labs    02/10/22 1327 02/10/22 1401 02/11/22 0707  HGB 15.0 14.4 12.6  HCT 44.0 45.4 38.8  PLT  --  183 148*  APTT  --  24  --   LABPROT  --  18.6*  --   INR  --  1.6*  --   CREATININE 0.80 0.89 0.79     Estimated Creatinine Clearance: 45.4 mL/min (by C-G formula based on SCr of 0.79 mg/dL).   Medical History: Past Medical History:  Diagnosis Date   Aortic stenosis    mild; mild MR; slightly increased pulmonary artery pressure with mild RVH; normal LV-2010   Arthritis    Atrial fibrillation (HCC)    chronic anticoagulation; adequate HR control on minimal AV nodal blocking medication    Cancer ([place])    skin cancers removed from face   CHF (congestive heart failure) ([place])    Diabetes mellitus without complication ([place])    2   Dyspnea    Fasting hyperglycemia    + microalbuminuria; A1c of 6.5% in 11/2010   Hematochezia 01/2009   01/2009-presumed ischemic colitis; history of diverticulosis   History of kidney stones    Hx of adenomatous colonic polyps 2005   adenomatous polyp; negative colonoscopy in 2009   Hyperlipidemia    Stroke [facility])       Assessment: 88 yo W on warfarin PTA for afib, last dose 12/22 am, admit INR 1.6. patient admitted for stroke vs TIA. Pharmacy consulted for warfarin dosing.    Originally scheduled for warfarin start, now to switch to Eliquis (Warfarin not administered)  Goal of Therapy:  INR 2-3 Monitor platelets by anticoagulation protocol: Yes   Plan:  Eliquis 5 mg PO BID  Bertis Ruddy,  PharmD, [place] Pharmacist ED Pharmacist Phone # [phone] 02/11/2022 4:47 PM

## 2022-02-11 NOTE — Progress Notes (Signed)
  Echocardiogram 2D Echocardiogram has been performed.  Anne Wolf 02/11/2022, 2:09 PM

## 2022-02-11 NOTE — Progress Notes (Signed)
EEG complete - results pending 

## 2022-02-11 NOTE — Progress Notes (Signed)
SLP Cancellation Note  Patient Details Name: Anne Wolf MRN: [id number] DOB: [birth year]   Cancelled treatment:       Reason Eval/Treat Not Completed: Patient at procedure or test/unavailable   Elvina Sidle, M.S., CCC-SLP 02/11/2022, 1:16 PM

## 2022-02-11 NOTE — Procedures (Signed)
Routine EEG Report  Anne Wolf is a 88 y.o. female with a history of seizure who is undergoing an EEG to evaluate for seizures.  Report: This EEG was acquired with electrodes placed according to the International 10-20 electrode system (including Fp1, Fp2, F3, F4, C3, C4, P3, P4, O1, O2, T3, T4, T5, T6, A1, A2, Fz, Cz, Pz). The following electrodes were missing or displaced: none.  The occipital dominant rhythm was 9 Hz. This activity is reactive to stimulation. Drowsiness was manifested by background fragmentation; deeper stages of sleep were identified by K complexes and sleep spindles. There was no focal slowing. There were no interictal epileptiform discharges. There were no electrographic seizures identified. Photic stimulation and hyperventilation were not performed.   Impression: This EEG was obtained while awake and asleep and is normal.    Clinical Correlation: Normal EEGs, however, do not rule out epilepsy.  Su Monks, MD [facility] [phone]  If 7pm- 7am, please page neurology on call as listed in [place].

## 2022-02-11 NOTE — Evaluation (Signed)
Occupational Therapy Evaluation Patient Details Name: Anne Wolf MRN: [id number] DOB: [birth year] Today's Date: 02/11/2022   History of Present Illness HPI: Anne Wolf is a 88 y.o. female with a history of atrial fibrillation on Coumadin, diabetes, hyperlipidemia, previous stroke who presents with seizure. She was transported to the emergency department and on arrival had left hemiplegia with rightward gaze and a code stroke was activated.  Head CT was negative and her INR was subtherapeutic at 1.6 and therefore TNK was discussed and administered to the patient.  MRI pending   Clinical Impression   Pt pleasant and cooperative.  Currently at min assist level for selfcare tasks sit to stand and for functional transfers without an assistive device.  Pt with memory deficits with some being present from prior CVA but now more severe.  No significant UE deficits noted.  Pt with questionable left inattention as she had trouble consistently locating therapist's moving hand peripherally on the left side when stimulation was presented on both the left and right simultaneously.  Has 24 hr supervision from spouse at home and will benefit from acute care OT at this time to help progress back to supervision level for discharge.        Recommendations for follow up therapy are one component of a multi-disciplinary discharge planning process, led by the attending physician.  Recommendations may be updated based on patient status, additional functional criteria and insurance authorization.   Follow Up Recommendations  Home health OT     Assistance Recommended at Discharge Frequent or constant Supervision/Assistance  Patient can return home with the following A little help with walking and/or transfers;A little help with bathing/dressing/bathroom;Assistance with cooking/housework;Assist for transportation;Direct supervision/assist for medications management;Help with stairs or ramp for entrance;Direct  supervision/assist for financial management    Functional Status Assessment  Patient has had a recent decline in their functional status and demonstrates the ability to make significant improvements in function in a reasonable and predictable amount of time.  Equipment Recommendations  Other (comment) (TBD may need tub seat/bench)       Precautions / Restrictions Precautions Precautions: Fall Restrictions Weight Bearing Restrictions: No      Mobility Bed Mobility                    Transfers Overall transfer level: Needs assistance Equipment used: Rolling walker (2 wheels) Transfers: Sit to/from Stand, Bed to chair/wheelchair/BSC Sit to Stand: Min assist     Step pivot transfers: Min assist     General transfer comment: Pt min assist for mobility to and from the bathroom with hand held assist.  LOB X2 in standing.  Did better with use of the RW when ambulating in the hallway but still min assist to min guard.      Balance Overall balance assessment: Needs assistance   Sitting balance-Leahy Scale: Good Sitting balance - Comments: Able to maintain sitting balance EOB with supervison   Standing balance support: Single extremity supported, During functional activity Standing balance-Leahy Scale: Poor Standing balance comment: LOB during standing posteriorly and to the left.                           ADL either performed or assessed with clinical judgement   ADL Overall ADL's : Needs assistance/impaired Eating/Feeding: Set up;Sitting Eating/Feeding Details (indicate cue type and reason): simulated Grooming: Minimal assistance;Standing Grooming Details (indicate cue type and reason): wash hands at the sink Upper Body Bathing:  Set up;Sitting   Lower Body Bathing: Minimal assistance;Sit to/from stand Lower Body Bathing Details (indicate cue type and reason): simulated     Lower Body Dressing: Minimal assistance;Sit to/from stand   Toilet Transfer:  Minimal assistance;BSC/3in1;Stand-pivot   Toileting- Clothing Manipulation and Hygiene: Minimal assistance;Sit to/from stand       Functional mobility during ADLs: Minimal assistance;Rolling walker (2 wheels) General ADL Comments: Pt with questionable left visual field deficits and neglect.  Slight delay and inconsistency with locating therapist finger on the left with peripheral testing.  She also veered closer to the left side of the door frame when walking back into the room.  Will continue to assess more in function.  Recommend shower seat/bench for home for safety.  Will continue to determine next visit.     Vision Baseline Vision/History: 0 No visual deficits Ability to See in Adequate Light: 0 Adequate Patient Visual Report: No change from baseline Vision Assessment?: Yes Eye Alignment: Within Functional Limits Ocular Range of Motion: Within Functional Limits Alignment/Gaze Preference: Within Defined Limits Tracking/Visual Pursuits: Able to track stimulus in all quads without difficulty Convergence: Within functional limits Visual Fields:  (grossly intact but pt with some delays on identifying object presented on the left.  No words missed with reaching a few sentences however.  Will continue to assess during treatment.)     Perception  Possible left inattention but will test further in treatment    Praxis  [facility]    Pertinent Vitals/Pain Pain Assessment Pain Assessment: Faces Pain Score: 0-No pain     Hand Dominance Right   Extremity/Trunk Assessment Upper Extremity Assessment Upper Extremity Assessment: Overall WFL for tasks assessed (slightly slower finger to nose on the left compared to right but not significant and not affecting functional use.)   Lower Extremity Assessment Lower Extremity Assessment: Defer to PT evaluation   Cervical / Trunk Assessment Cervical / Trunk Assessment: Normal   Communication Communication Communication: No difficulties   Cognition  Arousal/Alertness: Awake/alert Behavior During Therapy: WFL for tasks assessed/performed Overall Cognitive Status: Impaired/Different from baseline Area of Impairment: Memory, Safety/judgement, Awareness                     Memory: Decreased short-term memory   Safety/Judgement: Decreased awareness of deficits, Decreased awareness of safety Awareness: Intellectual   General Comments: Pt with some short term memory deficits from previous CVA per report from spouse however pt now with more severe.  Reported year as "102" and her age as "67" instead of 89.                Home Living Family/patient expects to be discharged to:: Private residence Living Arrangements: Spouse/significant other Available Help at Discharge: Family;Available 24 hours/day Type of Home: House Home Access: Stairs to enter [employer] of Steps: 2-6 (depending on entrance; wall for support on 2 stairs, rail on 6 stairs) Entrance Stairs-Rails:  (wall for support on 2 stairs, rail on 6 stairs) Home Layout: Multi-level;Able to live on main level with bedroom/bathroom     Bathroom Shower/Tub: Teacher, early years/pre: Standard Bathroom Accessibility: Yes   Home Equipment: None          Prior Functioning/Environment Prior Level of Function : Independent/Modified Independent             Mobility Comments: No AD. No falls ADLs Comments: STM deficits at baseline from prior CVA, uses pill box but mod I for it and all ADLs; still drives  OT Problem List: Impaired balance (sitting and/or standing);Decreased safety awareness;Decreased cognition;Decreased knowledge of use of DME or AE;Impaired vision/perception      OT Treatment/Interventions: Self-care/ADL training;Patient/family education;Balance training;Neuromuscular education;Therapeutic activities;DME and/or AE instruction;Cognitive remediation/compensation;Therapeutic exercise    OT Goals(Current goals can be  found in the care plan section) Acute Rehab OT Goals Patient Stated Goal: Pt did not state during session OT Goal Formulation: With patient Time For Goal Achievement: 02/25/22 Potential to Achieve Goals: Good  OT Frequency: Min 2X/week    Co-evaluation PT/OT/SLP Co-Evaluation/Treatment: Yes   PT goals addressed during session: Mobility/safety with mobility OT goals addressed during session: ADL's and self-care      AM-PAC OT "6 Clicks" Daily Activity     Outcome Measure Help from another person eating meals?: A Little Help from another person taking care of personal grooming?: A Little Help from another person toileting, which includes using toliet, bedpan, or urinal?: A Little Help from another person bathing (including washing, rinsing, drying)?: A Little Help from another person to put on and taking off regular upper body clothing?: A Little Help from another person to put on and taking off regular lower body clothing?: A Little 6 Click Score: 18   End of Session Equipment Utilized During Treatment: Rolling walker (2 wheels);Gait belt Nurse Communication: Mobility status  Activity Tolerance: Patient tolerated treatment well Patient left: in chair;with call bell/phone within reach;with family/visitor present  OT Visit Diagnosis: Unsteadiness on feet (R26.81);Muscle weakness (generalized) (M62.81);Other symptoms and signs involving cognitive function Hemiplegia - Right/Left: Left Hemiplegia - dominant/non-dominant: Non-Dominant Hemiplegia - caused by: Unspecified                Time: 1000-1024 OT Time Calculation (min): 24 min Charges:  OT General Charges $OT Visit: 1 Visit OT Evaluation $OT Eval Moderate Complexity: 1 Mod Kai Railsback OTR/L 02/11/2022, 10:55 AM

## 2022-02-11 NOTE — Progress Notes (Addendum)
STROKE TEAM PROGRESS NOTE   INTERVAL HISTORY Patient evaluated with husband present. She is alert, oriented x 3. She has no focal neurologic deficits.  Vitals:   02/11/22 0200 02/11/22 0300 02/11/22 0400 02/11/22 0500  BP: 121/61 124/80 (!) 117/55 130/60  Pulse: 78 86 80 89  Resp: 20 (!) 30 16 (!) 23  Temp:   (!) 97.5 F (36.4 C)   TempSrc:   Oral   SpO2: 96% 99% 95% 96%  Weight:       CBC:  Recent Labs  Lab 02/10/22 1401 02/11/22 0707  WBC 7.5 7.7  NEUTROABS 4.8  --   HGB 14.4 12.6  HCT 45.4 38.8  MCV 92.1 91.1  PLT 183 976*   Basic Metabolic Panel:  Recent Labs  Lab 02/10/22 1401 02/11/22 0707  NA 140 139  K 3.5 3.7  CL 105 108  CO2 24 23  GLUCOSE 135* 125*  BUN 12 8  CREATININE 0.89 0.79  CALCIUM 9.5 8.6*   Lipid Panel: No results for input(s): "CHOL", "TRIG", "HDL", "CHOLHDL", "VLDL", "LDLCALC" in the last 168 hours. HgbA1c: No results for input(s): "HGBA1C" in the last 168 hours. Urine Drug Screen:  Recent Labs  Lab 02/10/22 1950  LABOPIA NONE DETECTED  COCAINSCRNUR NONE DETECTED  LABBENZ NONE DETECTED  AMPHETMU NONE DETECTED  THCU NONE DETECTED  LABBARB NONE DETECTED    Alcohol Level  Recent Labs  Lab 02/10/22 1401  ETH <10    IMAGING past 24 hours CT ANGIO HEAD NECK W WO CM  Addendum Date: 02/10/2022   ADDENDUM REPORT: 02/10/2022 15:19 ADDENDUM: No emergent large vessel occlusion is identified. These results were called by telephone at the time of interpretation on 02/10/2022 at 2:15 pm to provider [facility] , who verbally acknowledged these results. Electronically Signed   By: Kellie Simmering D.O.   On: 02/10/2022 15:19   Result Date: 02/10/2022 CLINICAL DATA:  Provided history: Neuro deficit, acute, stroke suspected. Left-sided weakness, possible hyperdense left MCA on head CT. EXAM: CT ANGIOGRAPHY HEAD AND NECK TECHNIQUE: Multidetector CT imaging of the head and neck was performed using the standard protocol during bolus administration  of intravenous contrast. Multiplanar CT image reconstructions and MIPs were obtained to evaluate the vascular anatomy. Carotid stenosis measurements (when applicable) are obtained utilizing NASCET criteria, using the distal internal carotid diameter as the denominator. RADIATION DOSE REDUCTION: This exam was performed according to the departmental dose-optimization program which includes automated exposure control, adjustment of the mA and/or kV according to patient size and/or use of iterative reconstruction technique. CONTRAST:  167m OMNIPAQUE IOHEXOL 350 MG/ML SOLN COMPARISON:  Non-contrast head CT 02/10/2022. CT angiogram head/neck 02/17/2021. FINDINGS: CTA NECK FINDINGS Aortic arch: Standard aortic branching. Atherosclerotic plaque within the visualized aortic arch and proximal major branch vessels of the neck. No hemodynamically significant innominate or proximal subclavian artery stenosis. Right carotid system: CCA and ICA patent within the neck without stenosis. Minimal atherosclerotic plaque about the carotid bifurcation. Left carotid system: CCA and ICA patent within the neck without stenosis. Minimal atherosclerotic plaque about the carotid bifurcation. Vertebral arteries: Codominant and patent within the neck without stenosis. Tortuosity of the proximal left V1 segment. Skeleton: Cervical spondylosis. Ossification of the posterior longitudinal ligament, greatest at C2-C3 and C3-C4. No acute fracture or aggressive osseous lesion. Other neck: Multiple thyroid nodules, the largest within the right lobe measuring 12 mm. These do not meet consensus criteria for ultrasound follow-up based on size. No follow-up imaging is recommended. Reference: J Am  Coll Radiol. 2015 Feb;12(2): 143-50. Upper chest: No consolidation within the imaged lung apices. Review of the MIP images confirms the above findings CTA HEAD FINDINGS Anterior circulation: The intracranial internal carotid arteries are patent. Mild nonstenotic  atherosclerotic plaque within the left paraclinoid segment. The M1 middle cerebral arteries are patent. Atherosclerotic irregularity of the M2 and more distal MCA vessels, bilaterally. No M2 proximal branch occlusion or high-grade proximal stenosis identified. The anterior cerebral arteries are patent. Hypoplastic left A1 segment. Posterior circulation: The intracranial vertebral arteries are patent. The basilar artery is patent. The posterior cerebral arteries are patent. A right posterior communicating artery is present. The left posterior communicating artery is diminutive or absent. Venous sinuses: Within the limitations of contrast timing, no convincing thrombus. Anatomic variants: As described. Review of the MIP images confirms the above findings IMPRESSION: CTA neck: 1. The common carotid, internal carotid and vertebral arteries are patent within the neck without stenosis. Minimal atherosclerotic plaque about both carotid bifurcations. 2.  Aortic Atherosclerosis (ICD10-I70.0). 3. Cervical spondylosis and ossification of the posterior longitudinal ligament. CTA head: Intracranial atherosclerotic disease, as described. No intracranial large vessel occlusion or proximal high-grade arterial stenosis identified. Electronically Signed: By: Kellie Simmering D.O. On: 02/10/2022 14:43   CT HEAD CODE STROKE WO CONTRAST  Result Date: 02/10/2022 CLINICAL DATA:  Code stroke.  Neuro deficit, acute, stroke suspected EXAM: CT HEAD WITHOUT CONTRAST TECHNIQUE: Contiguous axial images were obtained from the base of the skull through the vertex without intravenous contrast. RADIATION DOSE REDUCTION: This exam was performed according to the departmental dose-optimization program which includes automated exposure control, adjustment of the mA and/or kV according to patient size and/or use of iterative reconstruction technique. COMPARISON:  CT head 10/21/2021. FINDINGS: Brain: Similar remote perforator infarct in the right basal  ganglia. No evidence of acute large vascular territory infarct, acute hemorrhage, mass lesion, midline shift or hydrocephalus. Vascular: No hyperdense vessel is identified. Skull: No acute fracture. Sinuses/Orbits: No acute findings. ASPECTS [facility] Stroke Program Early CT Score) total score (0-10 with 10 being normal): 10 IMPRESSION: 1. No evidence of acute intracranial abnormality. ASPECTS is 10. 2. Remote right basal ganglia perforator infarct. Electronically Signed   By: Margaretha Sheffield M.D.   On: 02/10/2022 13:49    PHYSICAL EXAM General: well-appearing and in no acute distress HEENT: normocephalic and atraumatic Cardiovascular: tachycardic Respiratory: normal respiratory effort and on RA Gastrointestinal: non-tender and non-distended Extremities: moving all extremities spontaneously  Mental Status: Anne Wolf is alert; she is oriented to person, oriented to place, and oriented to situation. Speech was clear and fluent without evidence of aphasia. She was able to follow 3 step commands but with difficulty.  Cranial Nerves: II:  Visual fields grossly normal; III,IV, VI: no ptosis, extra-ocular motions intact bilaterally V,VII: smile symmetric, facial light touch sensation intact bilaterally VIII: hearing grossly diminished XII: midline tongue extension without atrophy and without fasciculations  Motor: Right : Upper extremity   5/5 full power  Lower extremity   5/5 full power  Left: Upper extremity   5/5 full power Lower extremity   5/5 full power  Tone and bulk: normal tone throughout; no atrophy noted  Sensory: sensation to light touch intact throughout bilaterally  Cerebellar: Finger-to-nose test normal, heel-to-shin test normal  Gait: not observed during encounter  ASSESSMENT/PLAN KAYLANI FROMME is a 88 y.o. female with a history of atrial fibrillation on Coumadin, diabetes, hyperlipidemia, previous stroke who presents with seizure. On arrival to ED had left  hemiplegia  with rightward gaze and a code stroke was activated.  Head CT was negative and her INR was subtherapeutic at 1.6, TNK given.   Strokelike symptoms status post TNK Seizure, however, TIA can not completely ruled out Present with witnessed GTC episode, body stiffness, tongue biting, urinary incontinence and most forming Code stroke CT Head no evidence of acute intracranial abnormality. ASPECTS is 10. Remote right basal ganglia perforator infarct. CTA head & neck unremarkable MRI no acute infarct 2D Echo EF 60 to 65% EEG normal LDL 86 HgbA1c 5.7 UDS negative VTE prophylaxis - SCDs Loaded with Keppra, continue Keppra 500 twice daily warfarin daily prior to admission, now on No antithrombotic.  Discussed with family, will switch to Eliquis for further stroke prevention. Therapy recommendations: Home health PT Disposition:  pending No driving until seizure-free for 6 months  Atrial fibrillation On warfarin prior to admission but INR 1.6, subtherapeutic on admission.  Was on Eliquis in the past however developed epistaxis status post nasal cauterization Also on metoprolol for rate control Discussed with family, will try Eliquis again for further stroke prevention.  History of stroke 01/2021 transient left-sided weakness, slurred speech and right gaze.  CT negative.  CT head and neck right A1 and A2 occlusion with distal reconstitution.  MRI showed right BG and caudate head infarct.  INR 1.3.  Resumed Coumadin on discharge  Hypertension Home meds:  lisinopril Stable Long-term BP goal normotensive  Hyperlipidemia Home meds:  simvastatin 40 mg LDL 112, goal < 70 Now on rosuvastatin 20 mg  Continue statin at discharge  Prediabetes Home meds:  metformin HgbA1c pending, goal < 7.0 CBGs SSI  Other Stroke Risk Factors Advanced Age >/= 62  Obesity, Body mass index is 31.95 kg/m., BMI >/= 30 associated with increased stroke risk, recommend weight loss, diet and exercise as  appropriate  Other Active Problems UA WBC 6-10  Hospital day # 1    ATTENDING NOTE: I reviewed above note and agree with the assessment and plan. Pt was seen and examined.   88 year old female with history of hypertension, hyperlipidemia, diabetes, A-fib on Coumadin, stroke 01/2021 admitted for seizure episode with body stiffness, unresponsive, urinary incontinence, most forming and tongue biting.  On arrival, patient has left-sided weakness, left-sided neglect, right gaze and confusion.  CT negative.  INR 1.6, status post TNK.  CT head and neck no LVO.  MRI no acute infarct.  EF 60 to 65%.  LDL 86, A1c 5.7, UDS negative.  Creatinine 0.89, UA WBC 6-10.  On exam, patient neurologically intact without focal deficit except disorientated to current year.  Etiology for patient seizure not quite clear, status post Keppra load, will continue Keppra 500 twice daily.  No driving until seizure-free for 6 months.  TIA cannot be clinically ruled out given history of A-fib on Coumadin with subtherapeutic INR.  Discussed with family and patient, will switch to Eliquis.  Given LDL not at goal, switch Zocor to Crestor 20.  PT therapy recommend home health.  For detailed assessment and plan, please refer to above/below as I have made changes wherever appropriate.   Rosalin Hawking, MD PhD Stroke Neurology 02/11/2022 7:11 PM  This patient is critically ill due to strokelike symptoms status post TNK, seizure, A-fib on Coumadin with subtherapeutic INR and at significant risk of neurological worsening, death form stroke, bleeding from TNK, status epilepticus, heart failure. This patient's care requires constant monitoring of vital signs, hemodynamics, respiratory and cardiac monitoring, review of multiple databases, neurological assessment, discussion with family,  other specialists and medical decision making of high complexity. I spent 45 minutes of neurocritical care time in the care of this patient. I had long  discussion with husband at bedside and son over the phone, updated pt current condition, treatment plan and potential prognosis, and answered all the questions.  They expressed understanding and appreciation.      To contact Stroke Continuity provider, please refer to [email or web address] After hours, contact General Neurology

## 2022-02-11 NOTE — Progress Notes (Signed)
PT Cancellation Note  Patient Details Name: MARJO GROSVENOR MRN: [id number] DOB: [birth year]   Cancelled Treatment:    Reason Eval/Treat Not Completed: Active bedrest order. Will plan to follow-up later and if activity orders progress.   Moishe Spice, PT, DPT Acute Rehabilitation Services  Office: [place] 02/11/2022, 7:46 AM

## 2022-02-11 NOTE — Evaluation (Signed)
Physical Therapy Evaluation Patient Details Name: Anne Wolf MRN: [id number] DOB: 05/11/1933 Today's Date: 02/11/2022  History of Present Illness  Anne Wolf is an 88 y.o. female who presented 02/10/22 with seizure. Upon arrival to ED she had left hemiplegia with rightward gaze and a code stroke was activated. TNK was administered. Head CT and MRI were negative. PMH: afib on Coumadin, DM, HLD, CVA, cancer, CHF, aortic stenosis   Clinical Impression  Pt presents with condition above and deficits mentioned below, see PT Problem List. PTA, she was independent without DME for functional mobility, living with her husband in a multi-level house with 2-6 STE (depending on entrance she takes). They stay on the main level of the house. Pt has some minor STM deficits at baseline from a prior CVA but was still driving and able to manage her own meds mod I using a pill box. Currently, pt is demonstrating increased cognitive deficits compared to her baseline along with deficits in balance and activity tolerance. Pt required minA to recover during her bouts of LOB when trying to ambulate without UE support. She benefited from using the RW, primarily only requiring min guard for safety and to cue pt to remain within the RW. Recommending follow-up with HHPT and husband support at d/c. Will continue to follow acutely.     Recommendations for follow up therapy are one component of a multi-disciplinary discharge planning process, led by the attending physician.  Recommendations may be updated based on patient status, additional functional criteria and insurance authorization.  Follow Up Recommendations Home health PT      Assistance Recommended at Discharge Frequent or constant Supervision/Assistance  Patient can return home with the following  A little help with walking and/or transfers;A little help with bathing/dressing/bathroom;Assistance with cooking/housework;Direct supervision/assist for  financial management;Direct supervision/assist for medications management;Assist for transportation;Help with stairs or ramp for entrance    Equipment Recommendations Rolling walker (2 wheels);Other (comment) (tub bench)  Recommendations for Other Services       Functional Status Assessment Patient has had a recent decline in their functional status and demonstrates the ability to make significant improvements in function in a reasonable and predictable amount of time.     Precautions / Restrictions Precautions Precautions: Fall Precaution Comments: SBP < 180 Restrictions Weight Bearing Restrictions: No      Mobility  Bed Mobility Overal bed mobility: Needs Assistance Bed Mobility: Supine to Sit     Supine to sit: Min guard, HOB elevated     General bed mobility comments: Min guard for safety    Transfers Overall transfer level: Needs assistance Equipment used: Rolling walker (2 wheels), 1 person hand held assist Transfers: Sit to/from Stand, Bed to chair/wheelchair/BSC Sit to Stand: Min assist   Step pivot transfers: Min assist       General transfer comment: MinA with HHA to come to stand and step bed > commode.    Ambulation/Gait Ambulation/Gait assistance: Min guard, Min assist Gait Distance (Feet): 90 Feet (x2 bouts of ~2 ft > ~90 ft) Assistive device: Rolling walker (2 wheels), None, 1 person hand held assist Gait Pattern/deviations: Step-through pattern, Decreased stride length, Drifts right/left Gait velocity: reduced Gait velocity interpretation: <1.31 ft/sec, indicative of household ambulator   General Gait Details: Pt taking a few steps with HHA to commode from bed first bout. Attempted no UE support upon second bout, but pt with lateral LOB 2x needing minA to recover. Min guard majority of time using RW, but needing cues to  remain medial within RW.  Stairs            Wheelchair Mobility    Modified Rankin (Stroke Patients Only) Modified  Rankin (Stroke Patients Only) Pre-Morbid Rankin Score: No significant disability Modified Rankin: Moderately severe disability     Balance Overall balance assessment: Needs assistance Sitting-balance support: No upper extremity supported, Feet supported Sitting balance-Leahy Scale: Good Sitting balance - Comments: able to donn socks long sitting in bed   Standing balance support: Bilateral upper extremity supported, Single extremity supported, During functional activity, No upper extremity supported Standing balance-Leahy Scale: Poor Standing balance comment: Reliant on UE support or minA                             Pertinent Vitals/Pain Pain Assessment Pain Assessment: Faces Faces Pain Scale: No hurt Pain Intervention(s): Monitored during session    Home Living Family/patient expects to be discharged to:: Private residence Living Arrangements: Spouse/significant other Available Help at Discharge: Family;Available 24 hours/day Type of Home: House Home Access: Stairs to enter Entrance Stairs-Rails:  (depending on entrance; wall for support on 2 stairs, rail on 6 stairs) Entrance Stairs-Number of Steps: 2-6 (depending on entrance; wall for support on 2 stairs, rail on 6 stairs)   Home Layout: Multi-level;Able to live on main level with bedroom/bathroom Home Equipment: None      Prior Function Prior Level of Function : Independent/Modified Independent             Mobility Comments: No AD. No falls. Walks 1 mile at least with husband daily ADLs Comments: STM deficits at baseline from prior CVA, uses pill box but mod I for it and all ADLs; still drives     Hand Dominance   Dominant Hand: Right    Extremity/Trunk Assessment   Upper Extremity Assessment Upper Extremity Assessment: Defer to OT evaluation    Lower Extremity Assessment Lower Extremity Assessment: Overall WFL for tasks assessed (MMT scores of 4+ grossly bil; sensation intact bil)     Cervical / Trunk Assessment Cervical / Trunk Assessment: Normal  Communication   Communication: No difficulties  Cognition Arousal/Alertness: Awake/alert Behavior During Therapy: WFL for tasks assessed/performed Overall Cognitive Status: Impaired/Different from baseline Area of Impairment: Memory, Safety/judgement, Awareness                     Memory: Decreased short-term memory   Safety/Judgement: Decreased awareness of deficits, Decreased awareness of safety Awareness: Intellectual   General Comments: Pt with some short term memory deficits from previous CVA per report from spouse however pt now with more severe.  Reported year as "60" and her age as "64" instead of 72 mid way during session but did state her DOB correctly at start of session.        General Comments General comments (skin integrity, edema, etc.): VSS on RA; educated pt's husband on need for assistance and RW at this time    Exercises     Assessment/Plan    PT Assessment Patient needs continued PT services  PT Problem List Decreased activity tolerance;Decreased balance;Decreased mobility;Decreased cognition;Decreased knowledge of use of DME;Decreased safety awareness       PT Treatment Interventions DME instruction;Gait training;Stair training;Functional mobility training;Therapeutic activities;Therapeutic exercise;Balance training;Neuromuscular re-education;Cognitive remediation;Patient/family education    PT Goals (Current goals can be found in the Care Plan section)  Acute Rehab PT Goals Patient Stated Goal: to get back to her normal PT Goal Formulation:  With patient/family Time For Goal Achievement: 02/25/22 Potential to Achieve Goals: Good    Frequency Min 3X/week     Co-evaluation PT/OT/SLP Co-Evaluation/Treatment: Yes Reason for Co-Treatment: Necessary to address cognition/behavior during functional activity;For patient/therapist safety;To address functional/ADL transfers PT  goals addressed during session: Mobility/safety with mobility;Balance;Proper use of DME         AM-PAC PT "6 Clicks" Mobility  Outcome Measure Help needed turning from your back to your side while in a flat bed without using bedrails?: A Little Help needed moving from lying on your back to sitting on the side of a flat bed without using bedrails?: A Little Help needed moving to and from a bed to a chair (including a wheelchair)?: A Little Help needed standing up from a chair using your arms (e.g., wheelchair or bedside chair)?: A Little Help needed to walk in hospital room?: A Little Help needed climbing 3-5 steps with a railing? : A Little 6 Click Score: 18    End of Session Equipment Utilized During Treatment: Gait belt Activity Tolerance: Patient tolerated treatment well Patient left: in chair;with call bell/phone within reach;with chair alarm set;with family/visitor present Nurse Communication: Mobility status PT Visit Diagnosis: Unsteadiness on feet (R26.81);Other abnormalities of gait and mobility (R26.89);Difficulty in walking, not elsewhere classified (R26.2)    Time: 1751-0258 PT Time Calculation (min) (ACUTE ONLY): 47 min   Charges:   PT Evaluation $PT Eval Moderate Complexity: 1 Mod PT Treatments $Gait Training: 8-22 mins        Moishe Spice, PT, DPT Acute Rehabilitation Services  Office: [phone]   Orvan Falconer 02/11/2022, 4:16 PM

## 2022-02-11 NOTE — Progress Notes (Signed)
ANTICOAGULATION CONSULT NOTE - Initial Consult  Pharmacy Consult for warfarin Indication: atrial fibrillation  Allergies  Allergen Reactions   Cat Hair Extract Other (See Comments)    Red eyes and Sneezing Also allergic to Dogs    Patient Measurements: Weight: 74.2 kg (163 lb 9.3 oz)   Vital Signs: Temp: 98.5 F (36.9 C) (12/23 0800) Temp Source: Axillary (12/23 0800) BP: 118/67 (12/23 1300) Pulse Rate: 79 (12/23 1300)  Labs: Recent Labs    02/10/22 1327 02/10/22 1401 02/11/22 0707  HGB 15.0 14.4 12.6  HCT 44.0 45.4 38.8  PLT  --  183 148*  APTT  --  24  --   LABPROT  --  18.6*  --   INR  --  1.6*  --   CREATININE 0.80 0.89 0.79    Estimated Creatinine Clearance: 45.4 mL/min (by C-G formula based on SCr of 0.79 mg/dL).   Medical History: Past Medical History:  Diagnosis Date   Aortic stenosis    mild; mild MR; slightly increased pulmonary artery pressure with mild RVH; normal LV-2010   Arthritis    Atrial fibrillation (HCC)    chronic anticoagulation; adequate HR control on minimal AV nodal blocking medication    Cancer ([place])    skin cancers removed from face   CHF (congestive heart failure) ([place])    Diabetes mellitus without complication ([place])    2   Dyspnea    Fasting hyperglycemia    + microalbuminuria; A1c of 6.5% in 11/2010   Hematochezia 01/2009   01/2009-presumed ischemic colitis; history of diverticulosis   History of kidney stones    Hx of adenomatous colonic polyps 2005   adenomatous polyp; negative colonoscopy in 2009   Hyperlipidemia    Stroke [facility])       Assessment: 88 yo W on warfarin PTA for afib, last dose 12/22 am, admit INR 1.6. patient admitted for stroke vs TIA. Pharmacy consulted for warfarin dosing.   No new drug interactions. Diet ordered.   Warfarin PTA '1mg'$  Sun/Wed, '2mg'$  AOD (ACC 12/4, TTR 68%)  Goal of Therapy:  INR 2-3 Monitor platelets by anticoagulation protocol: Yes   Plan:  Warfarin '2mg'$  x1 Monitor daily INR,  CBC Monitor for signs/symptoms of bleeding   Benetta Spar, PharmD, BCPS, BCCP Clinical Pharmacist  Please check AMION for all [place] phone numbers After 10:00 PM, call [place]

## 2022-02-12 DIAGNOSIS — R569 Unspecified convulsions: Secondary | ICD-10-CM | POA: Diagnosis not present

## 2022-02-12 DIAGNOSIS — G459 Transient cerebral ischemic attack, unspecified: Secondary | ICD-10-CM

## 2022-02-12 DIAGNOSIS — Z9282 Status post administration of tPA (rtPA) in a different facility within the last 24 hours prior to admission to current facility: Secondary | ICD-10-CM

## 2022-02-12 LAB — BASIC METABOLIC PANEL
Anion gap: 11 (ref 5–15)
BUN: 7 mg/dL — ABNORMAL LOW (ref 8–23)
CO2: 25 mmol/L (ref 22–32)
Calcium: 9.1 mg/dL (ref 8.9–10.3)
Chloride: 104 mmol/L (ref 98–111)
Creatinine, Ser: 0.92 mg/dL (ref 0.44–1.00)
GFR, Estimated: >60 mL/min (ref >60)
Glucose, Bld: 123 mg/dL — ABNORMAL HIGH (ref 70–99)
Potassium: 3.7 mmol/L (ref 3.5–5.1)
Sodium: 140 mmol/L (ref 135–145)

## 2022-02-12 LAB — CBC
HCT: 37.6 % (ref 36.0–46.0)
Hemoglobin: 12.1 g/dL (ref 12.0–15.0)
MCH: 29.2 pg (ref 26.0–34.0)
MCHC: 32.2 g/dL (ref 30.0–36.0)
MCV: 90.8 fL (ref 80.0–100.0)
Platelets: 146 10*3/uL — ABNORMAL LOW (ref 150–400)
RBC: 4.14 MIL/uL (ref 3.87–5.11)
RDW: 13.8 % (ref 11.5–15.5)
WBC: 9 10*3/uL (ref 4.0–10.5)
nRBC: 0 % (ref 0.0–0.2)

## 2022-02-12 LAB — GLUCOSE, CAPILLARY: Glucose-Capillary: 133 mg/dL — ABNORMAL HIGH (ref 70–99)

## 2022-02-12 MED ORDER — LEVETIRACETAM 500 MG PO TABS: 500.0000 mg | ORAL_TABLET | Freq: Two times a day (BID) | ORAL | 1 refills | Status: DC

## 2022-02-12 MED ORDER — APIXABAN 5 MG PO TABS: 5.0000 mg | ORAL_TABLET | Freq: Two times a day (BID) | ORAL | 2 refills | Status: DC

## 2022-02-12 MED ORDER — ROSUVASTATIN CALCIUM 20 MG PO TABS: 20.0000 mg | ORAL_TABLET | Freq: Every day | ORAL | 1 refills | Status: AC

## 2022-02-12 NOTE — Progress Notes (Signed)
Patient discharged to home. All discharge instructions reviewed with patient and her husband with good understanding verbalized. PIV and cardiac monitor removed. All belongings taken. Vitals stable, in NAD.   Blood pressure 117/64, pulse 90, temperature 97.8 F (36.6 C), temperature source Oral, resp. rate (!) 22, weight 74.2 kg, SpO2 94 %.

## 2022-02-12 NOTE — Discharge Summary (Cosign Needed)
Stroke Discharge Summary  Patient ID: Anne Wolf   MRN: [id number]      DOB: 1933/05/24  Date of Admission: 02/10/2022 Date of Discharge: 02/12/2022  Attending Physician:  Stroke, Md, MD, Stroke MD Consultant(s):    None  Patient's PCP:  Asencion Noble, MD  DISCHARGE DIAGNOSIS: strokelike symptoms s/p TNK with negative MRI as well as seizure and possible TIA Principal Problem:   Acute ischemic stroke [facility]) Active Problems:   Stroke (cerebrum) ([place])   Allergies as of 02/12/2022       Reactions   Cat Hair Extract Other (See Comments)   Red eyes and Sneezing Also allergic to Dogs        Medication List     STOP taking these medications    cephALEXin 500 MG capsule Commonly known as: KEFLEX   simvastatin 40 MG tablet Commonly known as: ZOCOR   warfarin 2 MG tablet Commonly known as: COUMADIN       TAKE these medications    alendronate 70 MG tablet Commonly known as: FOSAMAX Take 70 mg by mouth once a week.   apixaban 5 MG Tabs tablet Commonly known as: ELIQUIS Take 1 tablet (5 mg total) by mouth 2 (two) times daily.   Biotin 5000 MCG Caps Take 10,000 mcg by mouth daily.   Centrum Silver tablet Take 1 tablet by mouth at bedtime.   cholecalciferol 1000 units tablet Commonly known as: VITAMIN D Take 5,000 Units by mouth at bedtime.   levETIRAcetam 500 MG tablet Commonly known as: KEPPRA Take 1 tablet (500 mg total) by mouth 2 (two) times daily.   lisinopril 10 MG tablet Commonly known as: ZESTRIL Take 10 mg by mouth at bedtime.   metFORMIN 500 MG tablet Commonly known as: GLUCOPHAGE Take 500 mg by mouth 2 (two) times daily with a meal.   metoprolol succinate 25 MG 24 hr tablet Commonly known as: TOPROL-XL Take 25 mg by mouth at bedtime.   mirabegron ER 25 MG Tb24 tablet Commonly known as: MYRBETRIQ Take 1 tablet (25 mg total) by mouth daily.   rosuvastatin 20 MG tablet Commonly known as: CRESTOR Take 1 tablet (20 mg total) by  mouth daily. Start taking on: February 13, 2022        LABORATORY STUDIES CBC    Component Value Date/Time   WBC 9.0 02/12/2022 0421   RBC 4.14 02/12/2022 0421   HGB 12.1 02/12/2022 0421   HGB 13.0 01/18/2021 1655   HCT 37.6 02/12/2022 0421   HCT 39.8 01/18/2021 1655   PLT 146 (L) 02/12/2022 0421   PLT 197 01/18/2021 1655   MCV 90.8 02/12/2022 0421   MCV 90 01/18/2021 1655   MCH 29.2 02/12/2022 0421   MCHC 32.2 02/12/2022 0421   RDW 13.8 02/12/2022 0421   RDW 12.7 01/18/2021 1655   LYMPHSABS 1.9 02/10/2022 1401   MONOABS 0.6 02/10/2022 1401   EOSABS 0.1 02/10/2022 1401   BASOSABS 0.1 02/10/2022 1401   CMP    Component Value Date/Time   NA 140 02/12/2022 0421   NA 144 01/18/2021 1655   K 3.7 02/12/2022 0421   CL 104 02/12/2022 0421   CO2 25 02/12/2022 0421   GLUCOSE 123 (H) 02/12/2022 0421   BUN 7 (L) 02/12/2022 0421   BUN 10 01/18/2021 1655   CREATININE 0.92 02/12/2022 0421   CALCIUM 9.1 02/12/2022 0421   PROT 7.9 02/10/2022 1401   ALBUMIN 4.2 02/10/2022 1401   AST 34 02/10/2022 1401  ALT 20 02/10/2022 1401   ALKPHOS 41 02/10/2022 1401   BILITOT 0.8 02/10/2022 1401   GFRNONAA >60 02/12/2022 0421   GFRAA >60 11/28/2017 1127   COAGS Lab Results  Component Value Date   INR 1.6 (H) 02/10/2022   INR 2.2 01/23/2022   INR 3.5 (A) 12/26/2021   Lipid Panel    Component Value Date/Time   CHOL 183 02/11/2022 0707   TRIG 64 02/11/2022 0707   TRIG 127 03/24/2008 0000   HDL 58 02/11/2022 0707   CHOLHDL 3.2 02/11/2022 0707   VLDL 13 02/11/2022 0707   LDLCALC 112 (H) 02/11/2022 0707   LDLCALC 108 03/24/2008 0000   HgbA1C  Lab Results  Component Value Date   HGBA1C 5.7 (H) 02/18/2021   Urinalysis    Component Value Date/Time   COLORURINE YELLOW 02/10/2022 1950   APPEARANCEUR CLEAR 02/10/2022 1950   APPEARANCEUR Hazy (A) 01/24/2022 1054   LABSPEC 1.042 (H) 02/10/2022 1950   PHURINE 7.0 02/10/2022 1950   GLUCOSEU NEGATIVE 02/10/2022 1950   HGBUR  MODERATE (A) 02/10/2022 1950   BILIRUBINUR NEGATIVE 02/10/2022 1950   BILIRUBINUR Negative 01/24/2022 1054   KETONESUR NEGATIVE 02/10/2022 1950   PROTEINUR 30 (A) 02/10/2022 1950   UROBILINOGEN 0.2 08/19/2021 1604   NITRITE NEGATIVE 02/10/2022 1950   LEUKOCYTESUR NEGATIVE 02/10/2022 1950   Urine Drug Screen     Component Value Date/Time   LABOPIA NONE DETECTED 02/10/2022 1950   COCAINSCRNUR NONE DETECTED 02/10/2022 1950   LABBENZ NONE DETECTED 02/10/2022 1950   AMPHETMU NONE DETECTED 02/10/2022 1950   THCU NONE DETECTED 02/10/2022 1950   LABBARB NONE DETECTED 02/10/2022 1950    Alcohol Level    Component Value Date/Time   ETH <10 02/10/2022 1401     SIGNIFICANT DIAGNOSTIC STUDIES EEG adult  Result Date: 02/11/2022 Derek Jack, MD     02/11/2022  3:37 PM Routine EEG Report Anne Wolf is a 88 y.o. female with a history of seizure who is undergoing an EEG to evaluate for seizures. Report: This EEG was acquired with electrodes placed according to the International 10-20 electrode system (including Fp1, Fp2, F3, F4, C3, C4, P3, P4, O1, O2, T3, T4, T5, T6, A1, A2, Fz, Cz, Pz). The following electrodes were missing or displaced: none. The occipital dominant rhythm was 9 Hz. This activity is reactive to stimulation. Drowsiness was manifested by background fragmentation; deeper stages of sleep were identified by K complexes and sleep spindles. There was no focal slowing. There were no interictal epileptiform discharges. There were no electrographic seizures identified. Photic stimulation and hyperventilation were not performed. Impression: This EEG was obtained while awake and asleep and is normal.   Clinical Correlation: Normal EEGs, however, do not rule out epilepsy. Su Monks, MD [facility] [phone] If 7pm- 7am, please page neurology on call as listed in [place].   ECHOCARDIOGRAM COMPLETE  Result Date: 02/11/2022    ECHOCARDIOGRAM REPORT   Patient Name:    Anne Wolf Date of Exam: 02/11/2022 Medical Rec #:  [id number]        Height:       60.0 in Accession #:    [id number]       Weight:       163.6 lb Date of Birth:  1933/04/03       BSA:          1.714 m Patient Age:    56 years         BP:  117/58 mmHg Patient Gender: F                HR:           86 bpm. Exam Location:  Inpatient Procedure: 2D Echo, Cardiac Doppler and Color Doppler Indications:    Stroke  History:        Patient has prior history of Echocardiogram examinations, most                 recent 07/06/2021. Aortic Valve Disease, Arrythmias:Atrial                 Fibrillation; Risk Factors:Hypertension and Dyslipidemia. Aortic                 Valve: 23 mm Sapien prosthetic, stented (TAVR) valve is present                 in the aortic position. Procedure Date: 05/31/2021.  Sonographer:    Clayton Lefort RDCS (AE) Referring Phys: [phone] MCNEILL P [place]  1. Left ventricular ejection fraction, by estimation, is 60 to 65%. The left ventricle has normal function. The left ventricle has no regional wall motion abnormalities. Left ventricular diastolic parameters are indeterminate.  2. Right ventricular systolic function is normal. The right ventricular size is normal.  3. Left atrial size was moderately dilated.  4. Right atrial size was moderately dilated.  5. The mitral valve is degenerative. Trivial mitral valve regurgitation. Mild to moderate mitral stenosis. Severe mitral annular calcification.  6. Normal appearing 23 mm Sapien 3 ultra valve with no PVL and stable gradients . The aortic valve has been repaired/replaced. Aortic valve regurgitation is not visualized. No aortic stenosis is present.  7. The inferior vena cava is dilated in size with >50% respiratory variability, suggesting right atrial pressure of 8 mmHg. FINDINGS  Left Ventricle: Left ventricular ejection fraction, by estimation, is 60 to 65%. The left ventricle has normal function. The left ventricle has no regional  wall motion abnormalities. The left ventricular internal cavity size was normal in size. There is  no left ventricular hypertrophy. Left ventricular diastolic parameters are indeterminate. Right Ventricle: The right ventricular size is normal. No increase in right ventricular wall thickness. Right ventricular systolic function is normal. Left Atrium: Left atrial size was moderately dilated. Right Atrium: Right atrial size was moderately dilated. Pericardium: There is no evidence of pericardial effusion. Mitral Valve: The mitral valve is degenerative in appearance. There is moderate thickening of the mitral valve leaflet(s). There is moderate calcification of the mitral valve leaflet(s). Severe mitral annular calcification. Trivial mitral valve regurgitation. Mild to moderate mitral valve stenosis. MV peak gradient, 16.6 mmHg. The mean mitral valve gradient is 8.0 mmHg. Tricuspid Valve: The tricuspid valve is normal in structure. Tricuspid valve regurgitation is mild . No evidence of tricuspid stenosis. Aortic Valve: Normal appearing 23 mm Sapien 3 ultra valve with no PVL and stable gradients. The aortic valve has been repaired/replaced. Aortic valve regurgitation is not visualized. No aortic stenosis is present. Aortic valve mean gradient measures 7.0 mmHg. Aortic valve peak gradient measures 12.7 mmHg. Aortic valve area, by VTI measures 2.42 cm. Pulmonic Valve: The pulmonic valve was normal in structure. Pulmonic valve regurgitation is not visualized. No evidence of pulmonic stenosis. Aorta: The aortic root is normal in size and structure. Venous: The inferior vena cava is dilated in size with greater than 50% respiratory variability, suggesting right atrial pressure of 8 mmHg. IAS/Shunts: No atrial level shunt detected by  color flow Doppler.  LEFT VENTRICLE PLAX 2D LVIDd:         3.10 cm LVIDs:         1.80 cm LV PW:         1.10 cm LV IVS:        0.90 cm LVOT diam:     2.30 cm LV SV:         77 LV SV Index:   45  LVOT Area:     4.15 cm  RIGHT VENTRICLE             IVC RV S prime:     12.00 cm/s  IVC diam: 2.20 cm TAPSE (M-mode): 1.9 cm LEFT ATRIUM              Index        RIGHT ATRIUM           Index LA diam:        2.80 cm  1.63 cm/m   RA Area:     19.60 cm LA Vol (A2C):   102.0 ml 59.52 ml/m  RA Volume:   57.60 ml  33.61 ml/m LA Vol (A4C):   47.5 ml  27.72 ml/m LA Biplane Vol: 71.7 ml  41.84 ml/m  AORTIC VALVE AV Area (Vmax):    2.32 cm AV Area (Vmean):   2.43 cm AV Area (VTI):     2.42 cm AV Vmax:           178.00 cm/s AV Vmean:          118.000 cm/s AV VTI:            0.319 m AV Peak Grad:      12.7 mmHg AV Mean Grad:      7.0 mmHg LVOT Vmax:         99.20 cm/s LVOT Vmean:        69.000 cm/s LVOT VTI:          0.186 m LVOT/AV VTI ratio: 0.58  AORTA Ao Root diam: 3.30 cm Ao Asc diam:  3.20 cm MITRAL VALVE MV Area VTI:  1.65 cm    SHUNTS MV Peak grad: 16.6 mmHg   Systemic VTI:  0.19 m MV Mean grad: 8.0 mmHg    Systemic Diam: 2.30 cm MV Vmax:      2.04 m/s MV Vmean:     127.0 cm/s Jenkins Rouge MD Electronically signed by Jenkins Rouge MD Signature Date/Time: 02/11/2022/2:18:53 PM    Final    MR BRAIN WO CONTRAST  Result Date: 02/11/2022 CLINICAL DATA:  Stroke, follow-up.  Post TNK. EXAM: MRI HEAD WITHOUT CONTRAST TECHNIQUE: Multiplanar, multiecho pulse sequences of the brain and surrounding structures were obtained without intravenous contrast. COMPARISON:  CT and CTA head/neck 1 day prior. FINDINGS: Brain: There is no acute intracranial hemorrhage, extra-axial fluid collection, or acute infarct. Small foci of elevated diffusion restriction in the left parietal white matter do not have corresponding low ADC signal, consistent with T2 shine through. Parenchymal volume is normal for age. The ventricles are normal in size. Gray-white differentiation is preserved. There is a small remote infarct in the right caudate head extending to the anterior limb of the internal capsule and lentiform nucleus. There is  otherwise minimal background chronic small-vessel ischemic change. There are punctate chronic microhemorrhages in the superomedial right parasagittal frontal lobe and medial right occipital lobe, nonspecific. The pituitary and suprasellar region are normal. There is no mass lesion. There is no mass  effect or midline shift. Vascular: Normal flow voids. Skull and upper cervical spine: Normal marrow signal. Sinuses/Orbits: The paranasal sinuses are clear. Bilateral lens implants are in place. The globes and orbits are otherwise unremarkable. Other: None. IMPRESSION: No acute intracranial pathology. No acute hemorrhage or infarct identified. Electronically Signed   By: Valetta Mole M.D.   On: 02/11/2022 14:10   CT ANGIO HEAD NECK W WO CM  Addendum Date: 02/10/2022   ADDENDUM REPORT: 02/10/2022 15:19 ADDENDUM: No emergent large vessel occlusion is identified. These results were called by telephone at the time of interpretation on 02/10/2022 at 2:15 pm to provider [facility] , who verbally acknowledged these results. Electronically Signed   By: Kellie Simmering D.O.   On: 02/10/2022 15:19   Result Date: 02/10/2022 CLINICAL DATA:  Provided history: Neuro deficit, acute, stroke suspected. Left-sided weakness, possible hyperdense left MCA on head CT. EXAM: CT ANGIOGRAPHY HEAD AND NECK TECHNIQUE: Multidetector CT imaging of the head and neck was performed using the standard protocol during bolus administration of intravenous contrast. Multiplanar CT image reconstructions and MIPs were obtained to evaluate the vascular anatomy. Carotid stenosis measurements (when applicable) are obtained utilizing NASCET criteria, using the distal internal carotid diameter as the denominator. RADIATION DOSE REDUCTION: This exam was performed according to the departmental dose-optimization program which includes automated exposure control, adjustment of the mA and/or kV according to patient size and/or use of iterative reconstruction  technique. CONTRAST:  173m OMNIPAQUE IOHEXOL 350 MG/ML SOLN COMPARISON:  Non-contrast head CT 02/10/2022. CT angiogram head/neck 02/17/2021. FINDINGS: CTA NECK FINDINGS Aortic arch: Standard aortic branching. Atherosclerotic plaque within the visualized aortic arch and proximal major branch vessels of the neck. No hemodynamically significant innominate or proximal subclavian artery stenosis. Right carotid system: CCA and ICA patent within the neck without stenosis. Minimal atherosclerotic plaque about the carotid bifurcation. Left carotid system: CCA and ICA patent within the neck without stenosis. Minimal atherosclerotic plaque about the carotid bifurcation. Vertebral arteries: Codominant and patent within the neck without stenosis. Tortuosity of the proximal left V1 segment. Skeleton: Cervical spondylosis. Ossification of the posterior longitudinal ligament, greatest at C2-C3 and C3-C4. No acute fracture or aggressive osseous lesion. Other neck: Multiple thyroid nodules, the largest within the right lobe measuring 12 mm. These do not meet consensus criteria for ultrasound follow-up based on size. No follow-up imaging is recommended. Reference: J Am Coll Radiol. 2015 Feb;12(2): 143-50. Upper chest: No consolidation within the imaged lung apices. Review of the MIP images confirms the above findings CTA HEAD FINDINGS Anterior circulation: The intracranial internal carotid arteries are patent. Mild nonstenotic atherosclerotic plaque within the left paraclinoid segment. The M1 middle cerebral arteries are patent. Atherosclerotic irregularity of the M2 and more distal MCA vessels, bilaterally. No M2 proximal branch occlusion or high-grade proximal stenosis identified. The anterior cerebral arteries are patent. Hypoplastic left A1 segment. Posterior circulation: The intracranial vertebral arteries are patent. The basilar artery is patent. The posterior cerebral arteries are patent. A right posterior communicating artery  is present. The left posterior communicating artery is diminutive or absent. Venous sinuses: Within the limitations of contrast timing, no convincing thrombus. Anatomic variants: As described. Review of the MIP images confirms the above findings IMPRESSION: CTA neck: 1. The common carotid, internal carotid and vertebral arteries are patent within the neck without stenosis. Minimal atherosclerotic plaque about both carotid bifurcations. 2.  Aortic Atherosclerosis (ICD10-I70.0). 3. Cervical spondylosis and ossification of the posterior longitudinal ligament. CTA head: Intracranial atherosclerotic disease, as described. No intracranial  large vessel occlusion or proximal high-grade arterial stenosis identified. Electronically Signed: By: Kellie Simmering D.O. On: 02/10/2022 14:43   CT HEAD CODE STROKE WO CONTRAST  Result Date: 02/10/2022 CLINICAL DATA:  Code stroke.  Neuro deficit, acute, stroke suspected EXAM: CT HEAD WITHOUT CONTRAST TECHNIQUE: Contiguous axial images were obtained from the base of the skull through the vertex without intravenous contrast. RADIATION DOSE REDUCTION: This exam was performed according to the departmental dose-optimization program which includes automated exposure control, adjustment of the mA and/or kV according to patient size and/or use of iterative reconstruction technique. COMPARISON:  CT head 10/21/2021. FINDINGS: Brain: Similar remote perforator infarct in the right basal ganglia. No evidence of acute large vascular territory infarct, acute hemorrhage, mass lesion, midline shift or hydrocephalus. Vascular: No hyperdense vessel is identified. Skull: No acute fracture. Sinuses/Orbits: No acute findings. ASPECTS [facility] Stroke Program Early CT Score) total score (0-10 with 10 being normal): 10 IMPRESSION: 1. No evidence of acute intracranial abnormality. ASPECTS is 10. 2. Remote right basal ganglia perforator infarct. Electronically Signed   By: Margaretha Sheffield M.D.   On:  02/10/2022 13:49      HISTORY OF PRESENT ILLNESS Patient with history of atrial fibrillation on Coumadin, diabetes, hyperlipidemia and previous stroke presented with GTC seizure and left hemiplegia with rightward gaze.  HOSPITAL COURSE Code stroke was activated, and head CT was negative for acute bleed.  INR was subtherapeutic at 1.6 and symptoms were thought to be due to a stroke.  TNK was given.  However, MRI was noted to be negative for acute stroke.  Cannot rule out TIA.  Patient was started on Keppra for further seizure prevention.  EEG was noted to be normal.  Discussion was had about anticoagulation, and decision was made to switch patient to Eliquis given difficulties maintaining therapeutic INR with Coumadin.  Strokelike symptoms status post TNK Seizure, however, TIA can not completely ruled out Present with witnessed GTC episode, body stiffness, tongue biting, urinary incontinence and most forming Code stroke CT Head no evidence of acute intracranial abnormality. ASPECTS is 10. Remote right basal ganglia perforator infarct. CTA head & neck unremarkable MRI no acute infarct 2D Echo EF 60 to 65% EEG normal LDL 86 HgbA1c 5.7 UDS negative VTE prophylaxis - SCDs Loaded with Keppra, continue Keppra 500 twice daily warfarin daily prior to admission, now on No antithrombotic.  Discussed with family, will switch to Eliquis for further stroke prevention. Therapy recommendations: Home health PT Disposition:  pending No driving until seizure-free for 6 months   Atrial fibrillation On warfarin prior to admission but INR 1.6, subtherapeutic on admission.  Was on Eliquis in the past however developed epistaxis status post nasal cauterization Also on metoprolol for rate control Discussed with family, will try Eliquis again for further stroke prevention.   History of stroke 01/2021 transient left-sided weakness, slurred speech and right gaze.  CT negative.  CT head and neck right A1 and A2  occlusion with distal reconstitution.  MRI showed right BG and caudate head infarct.  INR 1.3.  Resumed Coumadin on discharge   Hypertension Home meds:  lisinopril Stable Long-term BP goal normotensive   Hyperlipidemia Home meds:  simvastatin 40 mg LDL 112, goal < 70 Now on rosuvastatin 20 mg  Continue statin at discharge   Prediabetes Home meds:  metformin HgbA1c pending, goal < 7.0 CBGs SSI   Other Stroke Risk Factors Advanced Age >/= 63  Obesity, Body mass index is 31.95 kg/m., BMI >/= 30 associated with  increased stroke risk, recommend weight loss, diet and exercise as appropriate  RN Pressure Injury Documentation:     DISCHARGE EXAM Blood pressure (!) 127/93, pulse 90, temperature 97.8 F (36.6 C), temperature source Oral, resp. rate (!) 23, weight 74.2 kg, SpO2 95 %. General: Alert, well-developed, well-nourished elderly patient in no acute distress Respiratory: Regular, unlabored respirations on room air  NEURO:  Mental Status: AA&Ox3  Speech/Language: speech is without dysarthria or aphasia.  Naming, repetition, fluency, and comprehension intact.  Cranial Nerves:  II: PERRL III, IV, VI: EOMI. Eyelids elevate symmetrically.  VII: Smile is symmetrical. VIII: hearing intact to voice. IX, X: Phonation is normal.  Motor: 5/5 strength to all muscle groups tested.  Tone: is normal and bulk is normal Sensation- Intact to light touch bilaterally. Gait- deferred  Discharge Diet       Diet   Diet Carb Modified Fluid consistency: Thin; Room service appropriate? Yes with Assist   liquids  DISCHARGE PLAN Disposition: Home Eliquis (apixaban) daily for secondary stroke prevention indefinitely Ongoing stroke risk factor control by Primary Care Physician at time of discharge Follow-up PCP Asencion Noble, MD in 2 weeks. Follow-up in [facility] in 8 weeks, office to schedule an appointment.   25 minutes were spent preparing  discharge.  [place] , MSN, AGACNP-BC [facility] See Amion for schedule and pager information 02/12/2022 11:54 AM

## 2022-02-12 NOTE — Progress Notes (Signed)
Physical Therapy Treatment Patient Details Name: Anne Wolf MRN: [id number] DOB: [birth year] Today's Date: 02/12/2022   History of Present Illness Anne Wolf is an 88 y.o. female who presented 02/10/22 with seizure. Upon arrival to ED she had left hemiplegia with rightward gaze and a code stroke was activated. TNK was administered. Head CT and MRI were negative. PMH: afib on Coumadin, DM, HLD, CVA, cancer, CHF, aortic stenosis    PT Comments    Pt progressing well from mobility stand point however remains to have impaired cognition, more impaired from baseline. Pt with impulsivity, decreased safety awareness, R inattention, and delayed response time in addition to more severely impaired memory. Pt with increased ambulation tolerance but doesn't benefit from use of RW as pt with impaired balance and demos significant more stability with use of RW. Pt was able to safely negotiate stairs to enter home and spouse reports he is "always" with her. Of note pt was in afib, HR ranging from 90s-140s t/o session. RN aware. Continue to recommend HHPT upon d/c to progress back to indep function without AD.    Recommendations for follow up therapy are one component of a multi-disciplinary discharge planning process, led by the attending physician.  Recommendations may be updated based on patient status, additional functional criteria and insurance authorization.  Follow Up Recommendations  Home health PT     Assistance Recommended at Discharge Frequent or constant Supervision/Assistance  Patient can return home with the following A little help with walking and/or transfers;A little help with bathing/dressing/bathroom;Assistance with cooking/housework;Direct supervision/assist for financial management;Direct supervision/assist for medications management;Assist for transportation;Help with stairs or ramp for entrance   Equipment Recommendations  Rolling walker (2 wheels);Other (comment) (tub  bench)    Recommendations for Other Services       Precautions / Restrictions Precautions Precautions: Fall Precaution Comments: SBP < 180, STM deficits at baseline Restrictions Weight Bearing Restrictions: No     Mobility  Bed Mobility Overal bed mobility: Needs Assistance Bed Mobility: Supine to Sit     Supine to sit: Min guard, Min assist     General bed mobility comments: Min guard for safety as pt trying to pull self up with bed rail up and scoot out bottom of bed requiring verbal cues for safety. with HOB flat and no bed rail pt reaching for PT to aide with  transfer to EOB, minA provided to achieve full upright posture    Transfers Overall transfer level: Needs assistance Equipment used: 1 person hand held assist Transfers: Sit to/from Stand, Bed to chair/wheelchair/BSC Sit to Stand: Min guard           General transfer comment: min G for safety as pt was impulsive and started standing before PT was ready as PT was still organizing lines    Ambulation/Gait Ambulation/Gait assistance: Min guard, Min assist Gait Distance (Feet): 200 Feet Assistive device: Rolling walker (2 wheels), None, 1 person hand held assist Gait Pattern/deviations: Step-through pattern, Decreased stride length, Drifts right/left Gait velocity: reduced, much improved with RW Gait velocity interpretation: <1.31 ft/sec, indicative of household ambulator   General Gait Details: pt ambulated with L HHA for about 37' with short step height and length in addition to reaching for wall to steady self. When asked how she feels pt did report "I don't feel as steady as when I'm walking outside with my husband." Pt given RW, pt with increased step height and length and more comfortable with fluid gait pattern. pt with noted vearing  to the L and R inattention requiring verbal cues to tend to objects on the R   Stairs Stairs: Yes Stairs assistance: Min guard Stair Management: One rail Right, Step to  pattern, Alternating pattern, Forwards Number of Stairs: 5 General stair comments: pt ascended reciprocally with R HR but descended using step to pattern leading with R LE.   Wheelchair Mobility    Modified Rankin (Stroke Patients Only) Modified Rankin (Stroke Patients Only) Pre-Morbid Rankin Score: No significant disability Modified Rankin: Moderately severe disability     Balance Overall balance assessment: Needs assistance Sitting-balance support: No upper extremity supported, Feet supported Sitting balance-Leahy Scale: Good     Standing balance support: Bilateral upper extremity supported, Single extremity supported, During functional activity, No upper extremity supported Standing balance-Leahy Scale: Poor Standing balance comment: Reliant on UE support or minA                            Cognition Arousal/Alertness: Awake/alert Behavior During Therapy: WFL for tasks assessed/performed Overall Cognitive Status: Impaired/Different from baseline Area of Impairment: Memory, Safety/judgement, Awareness                     Memory: Decreased short-term memory (STM deficits at baseline however it has worsened since this admission)   Safety/Judgement: Decreased awareness of deficits, Decreased awareness of safety (R inattention) Awareness: Intellectual   General Comments: Pt with some short term memory deficits from previous CVA per report from spouse however pt now with more severe.  Pt oriented x4 however demos decreased safety awareness and decreased insight to deficits. Pt with difficulty discerning between L and R and noted vearing to the L and L bias with R inattention t/o all mobility.        Exercises      General Comments General comments (skin integrity, edema, etc.): Pt in afib, HR varied from 90s to 140s      Pertinent Vitals/Pain Pain Assessment Pain Assessment: Faces Faces Pain Scale: No hurt    Home Living                           Prior Function            PT Goals (current goals can now be found in the care plan section) Acute Rehab PT Goals Patient Stated Goal: to get back to her normal PT Goal Formulation: With patient/family Time For Goal Achievement: 02/25/22 Potential to Achieve Goals: Good Progress towards PT goals: Progressing toward goals    Frequency    Min 3X/week      PT Plan Current plan remains appropriate    Co-evaluation              AM-PAC PT "6 Clicks" Mobility   Outcome Measure  Help needed turning from your back to your side while in a flat bed without using bedrails?: A Little Help needed moving from lying on your back to sitting on the side of a flat bed without using bedrails?: A Little Help needed moving to and from a bed to a chair (including a wheelchair)?: A Little Help needed standing up from a chair using your arms (e.g., wheelchair or bedside chair)?: A Little Help needed to walk in hospital room?: A Little Help needed climbing 3-5 steps with a railing? : A Little 6 Click Score: 18    End of Session Equipment Utilized During Treatment: Gait belt  Activity Tolerance: Patient tolerated treatment well Patient left: in chair;with call bell/phone within reach;with chair alarm set;with family/visitor present Nurse Communication: Mobility status PT Visit Diagnosis: Unsteadiness on feet (R26.81);Other abnormalities of gait and mobility (R26.89);Difficulty in walking, not elsewhere classified (R26.2)     Time: 4665-9935 PT Time Calculation (min) (ACUTE ONLY): 22 min  Charges:  $Gait Training: 8-22 mins                     Kittie Plater, PT, DPT Acute Rehabilitation Services Secure chat preferred Office #: [phone]    Berline Lopes 02/12/2022, 8:42 AM

## 2022-02-12 NOTE — Discharge Instructions (Signed)

## 2022-02-12 NOTE — Progress Notes (Signed)
Helen to RN about floor mats needed for safety plan, pt being discharged

## 2022-02-12 NOTE — TOC Transition Note (Signed)
Transition of Care [facility]) - CM/SW Discharge Note   Patient Details  Name: Anne Wolf MRN: [id number] Date of Birth: 1934/02/19  Transition of Care [facility] CM/SW Contact:  Bartholomew Crews, RN Phone Number: [phone] 02/12/2022, 11:11 AM   Clinical Narrative:     Spoke with patient and spouse at the bedside to discuss post acute transition. PTA home with spouse. Stated they walk a mile every day and is hopeful to return to this. Discussed recommendations for [facility] - patient agreeable. No preference for agency. Referral accepted by [facility]. Patient will need HH PT, OT Face to Face order. Patient has a walker at home, and may already have a tub bench, but will get one if not. Advised that tub benches not covered under Medicare. Spouse to provide transportation home. No further TOC needs identified at this time.    Barriers to Discharge: Continued Medical Work up   Patient Goals and CMS Choice CMS Medicare.gov Compare Post Acute Care list provided to:: Patient Choice offered to / list presented to : Patient  Discharge Placement                         Discharge Plan and Services Additional resources added to the After Visit Summary for   In-house Referral: NA Discharge Planning Services: CM Consult Post Acute Care Choice: Home Health, Durable Medical Equipment          DME Arranged: N/A DME Agency: NA       HH Arranged: PT, OT [place] Agency: [place] Date [facility] Agency Contacted: 02/12/22 Time HH Agency Contacted: 1110 Representative spoke with at [place] [place] Determinants of Health ([place]) Interventions SDOH Screenings   Depression (PHQ2-9): Low Risk  (04/06/2021)  Tobacco Use: Low Risk  (02/10/2022)     Readmission Risk Interventions    06/01/2021    1:40 PM  Readmission Risk Prevention Plan  Post Dischage Appt Complete  Medication Screening Complete  Transportation Screening Complete

## 2022-02-13 ENCOUNTER — Other Ambulatory Visit: Payer: Self-pay | Admitting: Neurology

## 2022-02-13 DIAGNOSIS — R569 Unspecified convulsions: Secondary | ICD-10-CM

## 2022-02-13 DIAGNOSIS — G459 Transient cerebral ischemic attack, unspecified: Secondary | ICD-10-CM

## 2022-02-14 LAB — HEMOGLOBIN A1C
Hgb A1c MFr Bld: 6.3 % — ABNORMAL HIGH (ref 4.8–5.6)
Mean Plasma Glucose: 134 mg/dL

## 2022-02-21 ENCOUNTER — Telehealth: Payer: Self-pay | Admitting: *Deleted

## 2022-02-21 ENCOUNTER — Ambulatory Visit: Payer: Self-pay | Admitting: *Deleted

## 2022-02-21 ENCOUNTER — Ambulatory Visit: Payer: Medicare HMO | Admitting: Urology

## 2022-02-21 DIAGNOSIS — G4089 Other seizures: Secondary | ICD-10-CM | POA: Diagnosis not present

## 2022-02-21 DIAGNOSIS — I4821 Permanent atrial fibrillation: Secondary | ICD-10-CM | POA: Diagnosis not present

## 2022-02-21 DIAGNOSIS — Z8673 Personal history of transient ischemic attack (TIA), and cerebral infarction without residual deficits: Secondary | ICD-10-CM | POA: Diagnosis not present

## 2022-02-21 NOTE — Telephone Encounter (Signed)
Mr. Brinley called stating that patient had a seizure recently. The Neurologist has taken her off of coumadin and put her on Eliquis.

## 2022-02-21 NOTE — Telephone Encounter (Signed)
Noted.  Pt removed from coumadin clinic.

## 2022-02-22 ENCOUNTER — Ambulatory Visit (HOSPITAL_COMMUNITY)
Admission: RE | Admit: 2022-02-22 | Discharge: 2022-02-22 | Disposition: A | Discharge status: Home / Self Care | Payer: No Typology Code available for payment source | Source: Ambulatory Visit | Attending: Urology | Admitting: Urology

## 2022-02-22 DIAGNOSIS — Z86010 Personal history of colonic polyps: Secondary | ICD-10-CM | POA: Diagnosis not present

## 2022-02-22 DIAGNOSIS — N200 Calculus of kidney: Secondary | ICD-10-CM | POA: Diagnosis not present

## 2022-02-22 DIAGNOSIS — E669 Obesity, unspecified: Secondary | ICD-10-CM | POA: Diagnosis not present

## 2022-02-22 DIAGNOSIS — I700 Atherosclerosis of aorta: Secondary | ICD-10-CM | POA: Diagnosis not present

## 2022-02-22 DIAGNOSIS — M810 Age-related osteoporosis without current pathological fracture: Secondary | ICD-10-CM | POA: Diagnosis not present

## 2022-02-22 DIAGNOSIS — I69398 Other sequelae of cerebral infarction: Secondary | ICD-10-CM | POA: Diagnosis not present

## 2022-02-22 DIAGNOSIS — K8689 Other specified diseases of pancreas: Secondary | ICD-10-CM | POA: Diagnosis not present

## 2022-02-22 DIAGNOSIS — Z85828 Personal history of other malignant neoplasm of skin: Secondary | ICD-10-CM | POA: Diagnosis not present

## 2022-02-22 DIAGNOSIS — Z7984 Long term (current) use of oral hypoglycemic drugs: Secondary | ICD-10-CM | POA: Diagnosis not present

## 2022-02-22 DIAGNOSIS — K3189 Other diseases of stomach and duodenum: Secondary | ICD-10-CM | POA: Diagnosis not present

## 2022-02-22 DIAGNOSIS — M47812 Spondylosis without myelopathy or radiculopathy, cervical region: Secondary | ICD-10-CM | POA: Diagnosis not present

## 2022-02-22 DIAGNOSIS — Z7901 Long term (current) use of anticoagulants: Secondary | ICD-10-CM | POA: Diagnosis not present

## 2022-02-22 DIAGNOSIS — K449 Diaphragmatic hernia without obstruction or gangrene: Secondary | ICD-10-CM | POA: Diagnosis not present

## 2022-02-22 DIAGNOSIS — I080 Rheumatic disorders of both mitral and aortic valves: Secondary | ICD-10-CM | POA: Diagnosis not present

## 2022-02-22 DIAGNOSIS — Z6828 Body mass index (BMI) 28.0-28.9, adult: Secondary | ICD-10-CM | POA: Diagnosis not present

## 2022-02-22 DIAGNOSIS — Z9181 History of falling: Secondary | ICD-10-CM | POA: Diagnosis not present

## 2022-02-24 ENCOUNTER — Ambulatory Visit (INDEPENDENT_AMBULATORY_CARE_PROVIDER_SITE_OTHER): Payer: No Typology Code available for payment source | Admitting: Urology

## 2022-02-24 ENCOUNTER — Encounter: Payer: Self-pay | Admitting: Urology

## 2022-02-24 VITALS — BP 119/76 | HR 79

## 2022-02-24 DIAGNOSIS — N200 Calculus of kidney: Secondary | ICD-10-CM

## 2022-02-24 DIAGNOSIS — N3281 Overactive bladder: Secondary | ICD-10-CM

## 2022-02-24 NOTE — Patient Instructions (Signed)

## 2022-02-24 NOTE — Progress Notes (Signed)
02/24/2022 11:53 AM   Anne Wolf Jan 08, 1934 [id number]  Referring provider: Asencion Noble, MD [address] [place],  [place] [address]  Followup OAB and Nephrolithiasis   HPI: Anne Wolf is a 89yo here for followup for OAb and nephrolithiasis. CT stone study shows multiple left renal calculi up to 72m. She has intermittent left groin pain. She was given sample of mirabegron '25mg'$  which failed to improve her urinary urgency and frequency.    PMH: Past Medical History:  Diagnosis Date   Aortic stenosis    mild; mild MR; slightly increased pulmonary artery pressure with mild RVH; normal LV-2010   Arthritis    Atrial fibrillation (HCC)    chronic anticoagulation; adequate HR control on minimal AV nodal blocking medication    Cancer ([facility]    skin cancers removed from face   CHF (congestive heart failure) ([facility]    Diabetes mellitus without complication ([facility]    2   Dyspnea    Fasting hyperglycemia    + microalbuminuria; A1c of 6.5% in 11/2010   Hematochezia 01/2009   01/2009-presumed ischemic colitis; history of diverticulosis   History of kidney stones    Hx of adenomatous colonic polyps 2005   adenomatous polyp; negative colonoscopy in 2009   Hyperlipidemia    Stroke ([facility]     Surgical History: Past Surgical History:  Procedure Laterality Date   CATARACT EXTRACTION W/PHACO Right 03/23/2014   Procedure: CATARACT EXTRACTION PHACO AND INTRAOCULAR LENS PLACEMENT ([place];  Surgeon: CWilliams Che MD;  Location: [facility];  Service: Ophthalmology;  Laterality: Right;  CDE:2.83   CATARACT EXTRACTION W/PHACO Left 07/27/2014   Procedure: CATARACT EXTRACTION PHACO AND INTRAOCULAR LENS PLACEMENT LEFT EYE CDE=6.97;  Surgeon: CWilliams Che MD;  Location: [facility];  Service: Ophthalmology;  Laterality: Left;   COLONOSCOPY  2009   negative; prior study with adenomatous polyp   CYSTOSCOPY/URETEROSCOPY/HOLMIUM LASER/STENT PLACEMENT Right 08/21/2016   Procedure:  CYSTOSCOPY/URETEROSCOPY/RETROGRADE PYELOGRAM//STENT PLACEMENT;  Surgeon: BNickie Retort MD;  Location: [facility];  Service: Urology;  Laterality: Right;   EYE SURGERY     INTRAOPERATIVE TRANSTHORACIC ECHOCARDIOGRAM N/A 05/31/2021   Procedure: INTRAOPERATIVE TRANSTHORACIC ECHOCARDIOGRAM;  Surgeon: CSherren Mocha MD;  [place] [facility]  Service: Open Heart Surgery;  Laterality: N/A;   RIGHT/LEFT HEART CATH AND CORONARY ANGIOGRAPHY N/A 02/04/2021   Procedure: RIGHT/LEFT HEART CATH AND CORONARY ANGIOGRAPHY;  Surgeon: CSherren Mocha MD;  [facility];  Service: Cardiovascular;  Laterality: N/A;   TRANSCATHETER AORTIC VALVE REPLACEMENT, TRANSFEMORAL N/A 05/31/2021   Procedure: Transcatheter Aortic Valve Replacement 267m Transfemoral;  Surgeon: CoSherren MochaMD;  [facility]: [facility] Service: Open Heart Surgery;  Laterality: N/A;  Percutaneous    Home Medications:  Allergies as of 02/24/2022       Reactions   Cat Hair Extract Other (See Comments)   Red eyes and Sneezing Also allergic to Dogs        Medication List        Accurate as of February 24, 2022 11:53 AM. If you have any questions, ask your nurse or doctor.          alendronate 70 MG tablet Commonly known as: FOSAMAX Take 70 mg by mouth once a week.   apixaban 5 MG Tabs tablet Commonly known as: ELIQUIS Take 1 tablet (5 mg total) by mouth 2 (two) times daily.   Biotin 5000 MCG Caps Take 10,000 mcg by mouth daily.   Centrum Silver tablet Take 1 tablet by mouth  at bedtime.   cholecalciferol 1000 units tablet Commonly known as: VITAMIN D Take 5,000 Units by mouth at bedtime.   levETIRAcetam 500 MG tablet Commonly known as: KEPPRA Take 1 tablet (500 mg total) by mouth 2 (two) times daily.   lisinopril 10 MG tablet Commonly known as: ZESTRIL Take 10 mg by mouth at bedtime.   metFORMIN 500 MG tablet Commonly known as: GLUCOPHAGE Take 500 mg by mouth 2 (two) times daily with a meal.   metoprolol  succinate 25 MG 24 hr tablet Commonly known as: TOPROL-XL Take 25 mg by mouth at bedtime.   mirabegron ER 25 MG Tb24 tablet Commonly known as: MYRBETRIQ Take 1 tablet (25 mg total) by mouth daily.   rosuvastatin 20 MG tablet Commonly known as: CRESTOR Take 1 tablet (20 mg total) by mouth daily.        Allergies:  Allergies  Allergen Reactions   Cat Hair Extract Other (See Comments)    Red eyes and Sneezing Also allergic to Dogs    Family History: Family History  Problem Relation Age of Onset   Cancer Father    Arrhythmia Sister        Atrial fibrillation    Social History:  reports that she has never smoked. She has never been exposed to tobacco smoke. She has never used smokeless tobacco. She reports that she does not drink alcohol and does not use drugs.  ROS: All other review of systems were reviewed and are negative except what is noted above in HPI  Physical Exam: BP 119/76   Pulse 79   Constitutional:  Alert and oriented, No acute distress. HEENT: Anne Wolf AT, moist mucus membranes.  Trachea midline, no masses. Cardiovascular: No clubbing, cyanosis, or edema. Respiratory: Normal respiratory effort, no increased work of breathing. GI: Abdomen is soft, nontender, nondistended, no abdominal masses GU: No CVA tenderness.  Lymph: No cervical or inguinal lymphadenopathy. Skin: No rashes, bruises or suspicious lesions. Neurologic: Grossly intact, no focal deficits, moving all 4 extremities. Psychiatric: Normal mood and affect.  Laboratory Data: Lab Results  Component Value Date   WBC 9.0 02/12/2022   HGB 12.1 02/12/2022   HCT 37.6 02/12/2022   MCV 90.8 02/12/2022   PLT 146 (L) 02/12/2022    Lab Results  Component Value Date   CREATININE 0.92 02/12/2022    No results found for: "PSA"  No results found for: "TESTOSTERONE"  Lab Results  Component Value Date   HGBA1C 6.3 (H) 02/11/2022    Urinalysis    Component Value Date/Time   COLORURINE YELLOW  02/10/2022 1950   APPEARANCEUR CLEAR 02/10/2022 1950   APPEARANCEUR Hazy (A) 01/24/2022 1054   LABSPEC 1.042 (H) 02/10/2022 1950   PHURINE 7.0 02/10/2022 1950   GLUCOSEU NEGATIVE 02/10/2022 1950   HGBUR MODERATE (A) 02/10/2022 1950   BILIRUBINUR NEGATIVE 02/10/2022 1950   BILIRUBINUR Negative 01/24/2022 1054   [place] 02/10/2022 1950   PROTEINUR 30 (A) 02/10/2022 1950   UROBILINOGEN 0.2 08/19/2021 1604   NITRITE NEGATIVE 02/10/2022 1950   LEUKOCYTESUR NEGATIVE 02/10/2022 1950    Lab Results  Component Value Date   LABMICR See below: 01/24/2022   WBCUA 6-10 (A) 01/24/2022   LABEPIT 0-10 01/24/2022   MUCUS Present (A) 01/24/2022   BACTERIA NONE SEEN 02/10/2022    Pertinent Imaging: CT stone study 02/23/2022: Images reviewed and discussed with the patient  No results found for this or any previous visit.  No results found for this or any previous visit.  No results found for this or any previous visit.  No results found for this or any previous visit.  No results found for this or any previous visit.  No valid procedures specified. No results found for this or any previous visit.  Results for orders placed in visit on 01/24/22  CT RENAL STONE STUDY  Narrative CLINICAL DATA:  Urolithiasis.  EXAM: CT ABDOMEN AND PELVIS WITHOUT CONTRAST  TECHNIQUE: Multidetector CT imaging of the abdomen and pelvis was performed following the standard protocol without IV contrast.  RADIATION DOSE REDUCTION: This exam was performed according to the departmental dose-optimization program which includes automated exposure control, adjustment of the mA and/or kV according to patient size and/or use of iterative reconstruction technique.  COMPARISON:  CT 02/15/2021 and older  FINDINGS: Lower chest: There is linear opacity lung bases likely scar or atelectasis. No pleural effusion. Status post TAVR. Question some enlargement of the right atrium. Small hiatal  hernia.  Hepatobiliary: On this non IV contrast exam liver has preserved contour. Gallbladder is nondilated.  Pancreas: Moderate pancreatic atrophy with some fatty replacement.  Spleen: Spleen is not enlarged.  Adrenals/Urinary Tract: Normal adrenal glands. No abnormal calcification is seen within the right kidney nor along the course of the right ureter. No left ureteral stone. There are least 4 stones in the left renal pelvis with the largest measuring a proximally 9 mm in cephalocaudal length. Slight ectasia of the renal pelvis but no caliectasis. Mild adjacent fat stranding in this location as well. Preserved contour to the urinary bladder.  Stomach/Bowel: On this non oral contrast exam, large bowel has a normal course and caliber with diffuse colonic stool. Normal appendix. Moderate debris in the nondilated stomach. Small bowel is nondilated on this non oral contrast exam. No free air or free fluid.  Vascular/Lymphatic: Normal caliber abdominal aorta and IVC. Scattered atherosclerotic calcified plaque along the aorta and branch vessels. No specific abnormal lymph node enlargement present in the abdomen or pelvis.  Reproductive: Uterus and bilateral adnexa are unremarkable.  Other: No abdominal wall hernia or abnormality. No abdominopelvic ascites.  Musculoskeletal: Curvature and degenerative changes seen of the spine. Scattered degenerative changes of the pelvis as well. There is some posterior osteophytes at the T12-L1 level with some stenosis along the central canal.  IMPRESSION: Multiple nonobstructing left-sided renal pelvis stones. No collecting system dilatation. Mild adjacent fat stranding. No ureteral stones.   Electronically Signed By: Jill Side M.D. On: 02/23/2022 15:56   Assessment & Plan:    1. Nephrolithiasis -We discussed the management of kidney stones. These options include observation, ureteroscopy, shockwave lithotripsy (ESWL) and  percutaneous nephrolithotomy (PCNL). We discussed which options are relevant to the patient's stone(s). We discussed the natural history of kidney stones as well as the complications of untreated stones and the impact on quality of life without treatment as well as with each of the above listed treatments. We also discussed the efficacy of each treatment in its ability to clear the stone burden. With any of these management options I discussed the signs and symptoms of infection and the need for emergent treatment should these be experienced. For each option we discussed the ability of each procedure to clear the patient of their stone burden.   For observation I described the risks which include but are not limited to silent renal damage, life-threatening infection, need for emergent surgery, failure to pass stone and pain.   For ureteroscopy I described the risks which include bleeding, infection, damage to  contiguous structures, positioning injury, ureteral stricture, ureteral avulsion, ureteral injury, need for prolonged ureteral stent, inability to perform ureteroscopy, need for an interval procedure, inability to clear stone burden, stent discomfort/pain, heart attack, stroke, pulmonary embolus and the inherent risks with general anesthesia.   For shockwave lithotripsy I described the risks which include arrhythmia, kidney contusion, kidney hemorrhage, need for transfusion, pain, inability to adequately break up stone, inability to pass stone fragments, Steinstrasse, infection associated with obstructing stones, need for alternate surgical procedure, need for repeat shockwave lithotripsy, MI, CVA, PE and the inherent risks with anesthesia/conscious sedation.   For PCNL I described the risks including positioning injury, pneumothorax, hydrothorax, need for chest tube, inability to clear stone burden, renal laceration, arterial venous fistula or malformation, need for embolization of kidney, loss of  kidney or renal function, need for repeat procedure, need for prolonged nephrostomy tube, ureteral avulsion, MI, CVA, PE and the inherent risks of general anesthesia.   - The patient would like to proceed with left ureteroscopic stone extraction - Urinalysis, Routine w reflex microscopic  2. OAB (overactive bladder) -patient defers therapy at this time.    No follow-ups on file.  Nicolette Bang, MD  [facility]

## 2022-02-28 ENCOUNTER — Telehealth: Payer: Self-pay

## 2022-02-28 NOTE — Telephone Encounter (Signed)
Patient will be out of  the [place] from January 18 - 28.  Was asking when the surgery will be scheduled.  Please advise.  Call back:  [phone]  Thanks, Helene Kelp

## 2022-02-28 NOTE — Telephone Encounter (Signed)
See below

## 2022-03-01 ENCOUNTER — Telehealth: Payer: Self-pay

## 2022-03-01 NOTE — Telephone Encounter (Signed)
I spoke with Ms Omlor. We have discussed possible surgery dates and 03/23/2022 was agreed upon by all parties. Patient given information about surgery date, what to expect pre-operatively and post operatively.    We discussed that a pre-op nurse will be calling to set up the pre-op visit that will take place prior to surgery. Informed patient that our office will communicate any additional care to be provided after surgery.    Patients questions or concerns were discussed during our call. Advised to call our office should there be any additional information, questions or concerns that arise. Patient verbalized understanding.

## 2022-03-01 NOTE — Telephone Encounter (Signed)
See other telephone encounter.

## 2022-03-02 ENCOUNTER — Telehealth: Payer: Self-pay

## 2022-03-02 NOTE — Telephone Encounter (Signed)
Patient and husband came by the office today to discuss surgery date and instructions  Reviewed surgery date for 03/23/2022. I discussed with patient and husband that we will obtain clearance per Dr. Alyson Ingles with Dr. Willey Blade to hold Eliquis 2 days prior to surgery.   Patient understands she will continue medication until our office reaches out to her concerning medication hold.

## 2022-03-03 ENCOUNTER — Telehealth: Payer: Self-pay | Admitting: *Deleted

## 2022-03-03 NOTE — Telephone Encounter (Signed)
Patient with diagnosis of atrial fibriliation on Eliquis for anticoagulation.    Procedure: cystoscopy with retrograde pyelogram left ureteroscopy with laser left ureteral stent placement   Date of procedure: 03/23/2022   CHA2DS2-VASc Score = 8   This indicates a 10.8% annual risk of stroke. The patient's score is based upon: CHF History: 0 HTN History: 1 Diabetes History: 1 Stroke History: 2 Vascular Disease History: 1 Age Score: 2 Gender Score: 1    CrCl 41 (Adj BW (SrCr. 0.89 on 02/10/2022 Platelet count 148  Patient is at elevated risk off of anticoagulation based on history of afib with recent stroke while on warfarin(02/10/2022). INR was subtherapeutic, was switched to Eliquis. Unsure it is safe to hold anticoagulant due to recent stroke.  Will forward to MD for further input   **This guidance is not considered finalized until pre-operative APP has relayed final recommendations.**

## 2022-03-03 NOTE — Telephone Encounter (Signed)
   Pre-operative Risk Assessment    Patient Name: Anne Wolf  DOB: 1933/09/13 MRN: [id number]      Request for Surgical Clearance    Procedure:   CYSTOSCOPY WITH RETROGRADE PYELOGRAM LEFT URETEROSCOPY WITH LASER LEFT URETERAL STENT PLACEMENT    Date of Surgery:  Clearance 03/23/22                                 Surgeon:  DR. PATRICK McKENZIE  Surgeon's Group or Practice Name:  [facility]  Phone number:  [phone] Fax number:  [phone]   Type of Clearance Requested:   - Medical  - Pharmacy:  Hold Apixaban (Eliquis) x 2 DAYS PRIOR   Type of Anesthesia:  General    Additional requests/questions:    Jiles Prows   03/03/2022, 11:25 AM

## 2022-03-06 NOTE — Telephone Encounter (Signed)
Would be a better question for her neurologist   Zandra Abts MD

## 2022-03-06 NOTE — Telephone Encounter (Signed)
   Name: Anne Wolf  DOB: 1933/12/08  MRN: [id number]  Primary Cardiologist: Carlyle Dolly, MD   Preoperative team, please contact this patient and set up a phone call appointment for further preoperative risk assessment. Please obtain consent and complete medication review. Thank you for your help.  I confirm that guidance regarding antiplatelet and oral anticoagulation therapy has been completed and, if necessary, noted below.  -Guidance for holding Eliquis should come from patient's neurologist due to history of CVA.   [name], Marissa Nestle, NP 03/06/2022, 7:47 PM [place]

## 2022-03-07 ENCOUNTER — Telehealth: Payer: Self-pay

## 2022-03-07 DIAGNOSIS — N200 Calculus of kidney: Secondary | ICD-10-CM | POA: Diagnosis not present

## 2022-03-07 NOTE — Telephone Encounter (Signed)
Patient came by the office to report she started with hematuria. Denies any pain. Urine amber and cloudy upon specimen given.  Will order ua/uc and route to MD to review.   History of UTI's. Patient has upcoming surgery planned

## 2022-03-07 NOTE — Telephone Encounter (Signed)
Left a voice message 03-07-22  Passing blood.  Has a trip Friday of his week. Wants to discuss what patient needs to do.  Call back:  [phone]

## 2022-03-07 NOTE — Telephone Encounter (Signed)
  Patient Consent for Virtual Visit        Anne Wolf has provided verbal consent on 03/07/2022 for a virtual visit (video or telephone).   CONSENT FOR VIRTUAL VISIT FOR:  Anne Wolf  By participating in this virtual visit I agree to the following:  I hereby voluntarily request, consent and authorize [facility] and its employed or contracted physicians, physician assistants, nurse practitioners or other licensed health care professionals (the Practitioner), to provide me with telemedicine health care services (the "Services") as deemed necessary by the treating Practitioner. I acknowledge and consent to receive the Services by the Practitioner via telemedicine. I understand that the telemedicine visit will involve communicating with the Practitioner through live audiovisual communication technology and the disclosure of certain medical information by electronic transmission. I acknowledge that I have been given the opportunity to request an in-person assessment or other available alternative prior to the telemedicine visit and am voluntarily participating in the telemedicine visit.  I understand that I have the right to withhold or withdraw my consent to the use of telemedicine in the course of my care at any time, without affecting my right to future care or treatment, and that the Practitioner or I may terminate the telemedicine visit at any time. I understand that I have the right to inspect all information obtained and/or recorded in the course of the telemedicine visit and may receive copies of available information for a reasonable fee.  I understand that some of the potential risks of receiving the Services via telemedicine include:  Delay or interruption in medical evaluation due to technological equipment failure or disruption; Information transmitted may not be sufficient (e.g. poor resolution of images) to allow for appropriate medical decision making by the  Practitioner; and/or  In rare instances, security protocols could fail, causing a breach of personal health information.  Furthermore, I acknowledge that it is my responsibility to provide information about my medical history, conditions and care that is complete and accurate to the best of my ability. I acknowledge that Practitioner's advice, recommendations, and/or decision may be based on factors not within their control, such as incomplete or inaccurate data provided by me or distortions of diagnostic images or specimens that may result from electronic transmissions. I understand that the practice of medicine is not an exact science and that Practitioner makes no warranties or guarantees regarding treatment outcomes. I acknowledge that a copy of this consent can be made available to me via my patient portal ([facility]), or I can request a printed copy by calling the office of [facility].    I understand that my insurance will be billed for this visit.   I have read or had this consent read to me. I understand the contents of this consent, which adequately explains the benefits and risks of the Services being provided via telemedicine.  I have been provided ample opportunity to ask questions regarding this consent and the Services and have had my questions answered to my satisfaction. I give my informed consent for the services to be provided through the use of telemedicine in my medical care   No need to send mychart consent, per pt. She will call surgeon to let them know who her neurologist is. I will let the surgeon's office know as well.  Appt scheduled 1-18 at 240pm for pre-op clearance.  Surgeon's office-notified. They will await call from pt to notify them who her neurologist is.

## 2022-03-07 NOTE — Telephone Encounter (Signed)
Returned call. No answer. Message left to return call to office.

## 2022-03-08 LAB — URINALYSIS, ROUTINE W REFLEX MICROSCOPIC
Bilirubin, UA: NEGATIVE
Glucose, UA: NEGATIVE
Ketones, UA: NEGATIVE
Nitrite, UA: NEGATIVE
Specific Gravity, UA: 1.03 (ref 1.005–1.030)
Urobilinogen, Ur: 1 mg/dL (ref 0.2–1.0)
pH, UA: 5.5 (ref 5.0–7.5)

## 2022-03-08 LAB — MICROSCOPIC EXAMINATION: RBC, Urine: >30 /hpf — AB (ref 0–2)

## 2022-03-08 MED ORDER — SULFAMETHOXAZOLE-TRIMETHOPRIM 800-160 MG PO TABS: 1.0000 | ORAL_TABLET | Freq: Two times a day (BID) | ORAL | 0 refills | Status: DC

## 2022-03-08 NOTE — Telephone Encounter (Signed)
UA reviewed with Dr. Alyson Ingles. Verbal order for bactrim DS 1 tablet twice day for 1 week. I called and spoke with husband- understands medication sent to pharmacy. Will wait for culture as well to confirm antibiotic

## 2022-03-08 NOTE — Addendum Note (Signed)
Addended by: Gibson Ramp K on: 03/08/2022 11:00 AM   Modules accepted: Orders

## 2022-03-09 ENCOUNTER — Ambulatory Visit: Payer: No Typology Code available for payment source | Attending: Cardiology | Admitting: General Practice

## 2022-03-09 DIAGNOSIS — Z01810 Encounter for preprocedural cardiovascular examination: Secondary | ICD-10-CM | POA: Diagnosis not present

## 2022-03-09 NOTE — Progress Notes (Signed)
Virtual Visit via Telephone Note   Because of Anne Wolf's co-morbid illnesses, she is at least at moderate risk for complications without adequate follow up.  This format is felt to be most appropriate for this patient at this time.  The patient did not have access to video technology/had technical difficulties with video requiring transitioning to audio format only (telephone).  All issues noted in this document were discussed and addressed.  No physical exam could be performed with this format.  Please refer to the patient's chart for her consent to telehealth for [facility].  Evaluation Performed:  Preoperative cardiovascular risk assessment _____________   Date:  03/09/2022   Patient ID:  Anne Wolf, DOB 07-Dec-1933, MRN [id number] Patient Location:  Home Provider location:   Office  Primary Care Provider:  Asencion Noble, MD Primary Cardiologist:  Carlyle Dolly, MD  Chief Complaint / Patient Profile   89 y.o. y/o female with a h/o status post TAVR, atrial fibrillation who is pending cystoscopy with retrograde pyelogram, ureteroscopy and stent placement and presents today for telephonic preoperative cardiovascular risk assessment.  History of Present Illness    Anne Wolf is a 89 y.o. female who presents via audio/video conferencing for a telehealth visit today.  Pt was last seen in cardiology clinic on 11/04/2021 by Dr. Carlyle Dolly.  At that time Anne Wolf was doing well .  The patient is now pending procedure as outlined above. Since her last visit, she remained stable from a cardiac standpoint.  Today she denies chest pain, shortness of breath, lower extremity edema, fatigue, palpitations, melena, hematuria, hemoptysis, diaphoresis, weakness, presyncope, syncope, orthopnea, and PND.   Past Medical History    Past Medical History:  Diagnosis Date   Aortic stenosis    mild; mild MR; slightly increased pulmonary artery pressure with mild  RVH; normal LV-2010   Arthritis    Atrial fibrillation (HCC)    chronic anticoagulation; adequate HR control on minimal AV nodal blocking medication    Cancer ([place])    skin cancers removed from face   CHF (congestive heart failure) ([place])    Diabetes mellitus without complication ([place])    2   Dyspnea    Fasting hyperglycemia    + microalbuminuria; A1c of 6.5% in 11/2010   Hematochezia 01/2009   01/2009-presumed ischemic colitis; history of diverticulosis   History of kidney stones    Hx of adenomatous colonic polyps 2005   adenomatous polyp; negative colonoscopy in 2009   Hyperlipidemia    Stroke [facility])    Past Surgical History:  Procedure Laterality Date   CATARACT EXTRACTION W/PHACO Right 03/23/2014   Procedure: CATARACT EXTRACTION PHACO AND INTRAOCULAR LENS PLACEMENT ([place]);  Surgeon: Williams Che, MD;  Location: [facility];  Service: Ophthalmology;  Laterality: Right;  CDE:2.83   CATARACT EXTRACTION W/PHACO Left 07/27/2014   Procedure: CATARACT EXTRACTION PHACO AND INTRAOCULAR LENS PLACEMENT LEFT EYE CDE=6.97;  Surgeon: Williams Che, MD;  Location: [facility];  Service: Ophthalmology;  Laterality: Left;   COLONOSCOPY  2009   negative; prior study with adenomatous polyp   CYSTOSCOPY/URETEROSCOPY/HOLMIUM LASER/STENT PLACEMENT Right 08/21/2016   Procedure: CYSTOSCOPY/URETEROSCOPY/RETROGRADE PYELOGRAM//STENT PLACEMENT;  Surgeon: Nickie Retort, MD;  Location: [facility];  Service: Urology;  Laterality: Right;   EYE SURGERY     INTRAOPERATIVE TRANSTHORACIC ECHOCARDIOGRAM N/A 05/31/2021   Procedure: INTRAOPERATIVE TRANSTHORACIC ECHOCARDIOGRAM;  Surgeon: Sherren Mocha, MD;  Location: [place];  Service: Open Heart Surgery;  Laterality: N/A;   RIGHT/LEFT  HEART CATH AND CORONARY ANGIOGRAPHY N/A 02/04/2021   Procedure: RIGHT/LEFT HEART CATH AND CORONARY ANGIOGRAPHY;  Surgeon: Sherren Mocha, MD;  Location: [place];  Service: Cardiovascular;  Laterality: N/A;   TRANSCATHETER AORTIC  VALVE REPLACEMENT, TRANSFEMORAL N/A 05/31/2021   Procedure: Transcatheter Aortic Valve Replacement 23m, Transfemoral;  Surgeon: CSherren Mocha MD;  Location: [facility]  Service: Open Heart Surgery;  Laterality: N/A;  Percutaneous    Allergies  Allergies  Allergen Reactions   Cat Hair Extract Other (See Comments)    Red eyes and Sneezing Also allergic to Dogs    Home Medications    Prior to Admission medications   Medication Sig Start Date End Date Taking? Authorizing Provider  alendronate (FOSAMAX) 70 MG tablet Take 70 mg by mouth once a week.    [provider]  apixaban (ELIQUIS) 5 MG TABS tablet Take 1 tablet (5 mg total) by mouth 2 (two) times daily. 02/12/22   de LYolanda Manges Cortney E, NP  Biotin 5000 MCG CAPS Take 10,000 mcg by mouth daily.    [provider]  cholecalciferol (VITAMIN D) 1000 units tablet Take 5,000 Units by mouth at bedtime.    [provider]  levETIRAcetam (KEPPRA) 500 MG tablet Take 1 tablet (500 mg total) by mouth 2 (two) times daily. 02/12/22   de LYolanda Manges Cortney E, NP  lisinopril (PRINIVIL,ZESTRIL) 10 MG tablet Take 10 mg by mouth at bedtime.    [provider]  metFORMIN (GLUCOPHAGE) 500 MG tablet Take 500 mg by mouth 2 (two) times daily with a meal.     [provider]  metoprolol succinate (TOPROL-XL) 25 MG 24 hr tablet Take 25 mg by mouth at bedtime.    [provider]  mirabegron ER (MYRBETRIQ) 25 MG TB24 tablet Take 1 tablet (25 mg total) by mouth daily. 01/24/22   McKenzie, PCandee Furbish MD  Multiple Vitamins-Minerals (CENTRUM SILVER) tablet Take 1 tablet by mouth at bedtime.     [provider]  rosuvastatin (CRESTOR) 20 MG tablet Take 1 tablet (20 mg total) by mouth daily. 02/13/22   de LYolanda Manges Cortney E, NP  sulfamethoxazole-trimethoprim (BACTRIM DS) 800-160 MG tablet Take 1 tablet by mouth every 12 (twelve) hours. 03/08/22   McKenzie, PCandee Furbish MD    Physical Exam    Vital Signs:   Anne Wolf not have vital signs available for review today.  Given telephonic nature of communication, physical exam is limited. AAOx3. NAD. Normal affect.  Speech and respirations are unlabored.  Accessory Clinical Findings    None  Assessment & Plan    1.  Preoperative Cardiovascular Risk Assessment:CYSTOSCOPY WITH RETROGRADE PYELOGRAM LEFT URETEROSCOPY WITH LASER LEFT URETERAL STENT PLACEMENT, Dr. PNicolette Bang   Primary Cardiologist: BCarlyle Dolly MD  Chart reviewed as part of pre-operative protocol coverage. Given past medical history and time since last visit, based on ACC/AHA guidelines, SGRETE BOSKOwould be at acceptable risk for the planned procedure without further cardiovascular testing.   Her RCRI is a class III risk, 6.6% risk of major cardiac event.  She is able to complete greater than 4 METS of physical activity.  Patient was advised that if she develops new symptoms prior to surgery to contact our office to arrange a follow-up appointment.  She verbalized understanding.  -Guidance for holding Eliquis should come from patient's neurologist due to history of CVA.   I will route this recommendation to the requesting party via Epic fax function  and remove from pre-op pool.      Time:   Today, I have spent 6 minutes with the patient with telehealth technology discussing medical history, symptoms, and management plan.  Prior to her phone evaluation I spent greater than 10 minutes reviewing her past medical history and cardiac medications.   Deberah Pelton, NP  03/09/2022, 8:24 AM

## 2022-03-10 LAB — URINE CULTURE

## 2022-03-17 ENCOUNTER — Telehealth: Payer: Self-pay

## 2022-03-17 NOTE — Telephone Encounter (Signed)
I still have not received surgical clearance from Neurology to receive approval to hold Eliquis. I called to discuss with patient/spouse that surgery would need to be scheduled.  Neurology appt 02/19  I will reach back out to neurology to request approval.

## 2022-03-20 ENCOUNTER — Encounter (HOSPITAL_COMMUNITY): Admission: RE | Admit: 2022-03-20 | Payer: No Typology Code available for payment source | Source: Ambulatory Visit

## 2022-03-23 ENCOUNTER — Inpatient Hospital Stay: Payer: Self-pay | Admitting: Neurology

## 2022-03-23 ENCOUNTER — Ambulatory Visit (HOSPITAL_COMMUNITY)
Admission: RE | Admit: 2022-03-23 | Payer: No Typology Code available for payment source | Source: Ambulatory Visit | Admitting: Urology

## 2022-03-23 ENCOUNTER — Encounter (HOSPITAL_COMMUNITY): Admission: RE | Payer: Self-pay | Source: Ambulatory Visit

## 2022-03-23 SURGERY — CYSTOURETEROSCOPY, WITH RETROGRADE PYELOGRAM AND STENT INSERTION
Anesthesia: General | Laterality: Left

## 2022-03-31 DIAGNOSIS — I700 Atherosclerosis of aorta: Secondary | ICD-10-CM | POA: Diagnosis not present

## 2022-03-31 DIAGNOSIS — Z79899 Other long term (current) drug therapy: Secondary | ICD-10-CM | POA: Diagnosis not present

## 2022-03-31 DIAGNOSIS — E785 Hyperlipidemia, unspecified: Secondary | ICD-10-CM | POA: Diagnosis not present

## 2022-03-31 DIAGNOSIS — E1129 Type 2 diabetes mellitus with other diabetic kidney complication: Secondary | ICD-10-CM | POA: Diagnosis not present

## 2022-03-31 DIAGNOSIS — M810 Age-related osteoporosis without current pathological fracture: Secondary | ICD-10-CM | POA: Diagnosis not present

## 2022-03-31 DIAGNOSIS — N200 Calculus of kidney: Secondary | ICD-10-CM | POA: Diagnosis not present

## 2022-03-31 DIAGNOSIS — I4821 Permanent atrial fibrillation: Secondary | ICD-10-CM | POA: Diagnosis not present

## 2022-04-03 ENCOUNTER — Encounter: Payer: No Typology Code available for payment source | Admitting: Urology

## 2022-04-04 DIAGNOSIS — I700 Atherosclerosis of aorta: Secondary | ICD-10-CM | POA: Diagnosis not present

## 2022-04-04 DIAGNOSIS — I4821 Permanent atrial fibrillation: Secondary | ICD-10-CM | POA: Diagnosis not present

## 2022-04-04 DIAGNOSIS — R7309 Other abnormal glucose: Secondary | ICD-10-CM | POA: Diagnosis not present

## 2022-04-04 DIAGNOSIS — I350 Nonrheumatic aortic (valve) stenosis: Secondary | ICD-10-CM | POA: Diagnosis not present

## 2022-04-04 DIAGNOSIS — E1122 Type 2 diabetes mellitus with diabetic chronic kidney disease: Secondary | ICD-10-CM | POA: Diagnosis not present

## 2022-04-04 DIAGNOSIS — Z8673 Personal history of transient ischemic attack (TIA), and cerebral infarction without residual deficits: Secondary | ICD-10-CM | POA: Diagnosis not present

## 2022-04-04 DIAGNOSIS — G4089 Other seizures: Secondary | ICD-10-CM | POA: Diagnosis not present

## 2022-04-10 ENCOUNTER — Ambulatory Visit (INDEPENDENT_AMBULATORY_CARE_PROVIDER_SITE_OTHER): Payer: No Typology Code available for payment source | Admitting: Neurology

## 2022-04-10 ENCOUNTER — Encounter: Payer: Self-pay | Admitting: Neurology

## 2022-04-10 VITALS — BP 116/77 | HR 72 | Ht 61.6 in | Wt 158.0 lb

## 2022-04-10 DIAGNOSIS — Z5181 Encounter for therapeutic drug level monitoring: Secondary | ICD-10-CM

## 2022-04-10 DIAGNOSIS — I639 Cerebral infarction, unspecified: Secondary | ICD-10-CM | POA: Diagnosis not present

## 2022-04-10 DIAGNOSIS — R569 Unspecified convulsions: Secondary | ICD-10-CM

## 2022-04-10 MED ORDER — LEVETIRACETAM 500 MG PO TABS: 500.0000 mg | ORAL_TABLET | Freq: Two times a day (BID) | ORAL | 3 refills | Status: DC

## 2022-04-10 MED ORDER — APIXABAN 5 MG PO TABS: 5.0000 mg | ORAL_TABLET | Freq: Two times a day (BID) | ORAL | 11 refills | Status: AC

## 2022-04-10 NOTE — Patient Instructions (Addendum)
Continue with Keppra 500 mg twice daily, will check a Keppra level today Continue your other medications Driving restriction for next 6 months Follow-up with 6 months or sooner if worse

## 2022-04-10 NOTE — Progress Notes (Signed)
[facility]  PATIENT: Anne Wolf DOB: [birth year]  REQUESTING CLINICIAN: Asencion Noble, MD HISTORY FROM: Patient and husband  REASON FOR VISIT: New onset seizure    HISTORICAL  CHIEF COMPLAINT:  Chief Complaint  Patient presents with   Hospitalization Follow-up    Rm EMG/NCV 4. Accompanied by husband. Hospital f/u/Saw Dr. Erlinda Hong - TIA and seizures/ f/u with MD/Discharged home 12/24.    HISTORY OF PRESENT ILLNESS:  This is a 89 year old woman past medical history atrial fibrillation, hypertension, hyperlipidemia, diabetes mellitus, and previous stroke in December 2022 who is presenting after being admitted to the hospital for new onset seizure in December 2023.  Per husband patient was at home, and he noted that he had a blank stare, was unresponsive and followed by generalized convulsion.  This lasted less than 5-minute.  She was confused afterward.  He took her to the hospital.  In the ED patient was noted to have a left hemiplegia with right gaze deviation.  Her CT head CT was negative for any acute bleed therefore she was given TKN.  Her MRI was negative for any acute stroke therefore seizure was considered.  She was started on Keppra 500 mg twice daily.  Since being on Keppra she has not had any additional event concerning for seizure.  While in the hospital her INR was low at 1.6 while being on Coumadin.  Upon discharge she was switched to Eliquis.   Handedness: Right handed   Onset: February 10 2022  Seizure Type: Generalized convulsion  Current frequency: Only once   Any injuries from seizures: Denies   Seizure risk factors: Stroke   Previous ASMs: None   Currenty ASMs: Levetiracetam 500 mg twice daily   ASMs side effects: Denies   Brain Images: No acute abnormality, previous right basal ganglia stroke   Previous EEGs: Normal EEG    OTHER MEDICAL CONDITIONS: Atrial fibrillation, Hypertension, Hyperlipidemia, Diabetes, Seizure disorder    REVIEW OF SYSTEMS: Full 14 system review of systems performed and negative with exception of: As noted in the HPI   ALLERGIES: Allergies  Allergen Reactions   Cat Hair Extract Other (See Comments)    Red eyes and Sneezing Also allergic to Dogs    HOME MEDICATIONS: Outpatient Medications Prior to Visit  Medication Sig Dispense Refill   alendronate (FOSAMAX) 70 MG tablet Take 70 mg by mouth once a week.     cholecalciferol (VITAMIN D) 1000 units tablet Take 5,000 Units by mouth at bedtime.     lisinopril (PRINIVIL,ZESTRIL) 10 MG tablet Take 10 mg by mouth at bedtime.     metFORMIN (GLUCOPHAGE) 500 MG tablet Take 500 mg by mouth 2 (two) times daily with a meal.      metoprolol succinate (TOPROL-XL) 25 MG 24 hr tablet Take 25 mg by mouth at bedtime.     rosuvastatin (CRESTOR) 20 MG tablet Take 1 tablet (20 mg total) by mouth daily. 30 tablet 1   apixaban (ELIQUIS) 5 MG TABS tablet Take 1 tablet (5 mg total) by mouth 2 (two) times daily. 60 tablet 2   levETIRAcetam (KEPPRA) 500 MG tablet Take 1 tablet (500 mg total) by mouth 2 (two) times daily. 60 tablet 1   mirabegron ER (MYRBETRIQ) 25 MG TB24 tablet Take 1 tablet (25 mg total) by mouth daily. 30 tablet 0   Biotin 5000 MCG CAPS Take 10,000 mcg by mouth daily.     Multiple Vitamins-Minerals (CENTRUM SILVER) tablet Take 1 tablet by mouth at bedtime.  sulfamethoxazole-trimethoprim (BACTRIM DS) 800-160 MG tablet Take 1 tablet by mouth every 12 (twelve) hours. 14 tablet 0   No facility-administered medications prior to visit.    PAST MEDICAL HISTORY: Past Medical History:  Diagnosis Date   Aortic stenosis    mild; mild MR; slightly increased pulmonary artery pressure with mild RVH; normal LV-2010   Arthritis    Atrial fibrillation (HCC)    chronic anticoagulation; adequate HR control on minimal AV nodal blocking medication    Cancer ([employer])    skin cancers removed from face   CHF (congestive heart failure) ([place])    Diabetes  mellitus without complication ([place])    2   Dyspnea    Fasting hyperglycemia    + microalbuminuria; A1c of 6.5% in 11/2010   Hematochezia 01/2009   01/2009-presumed ischemic colitis; history of diverticulosis   History of kidney stones    Hx of adenomatous colonic polyps 2005   adenomatous polyp; negative colonoscopy in 2009   Hyperlipidemia    Stroke [facility])     PAST SURGICAL HISTORY: Past Surgical History:  Procedure Laterality Date   CATARACT EXTRACTION W/PHACO Right 03/23/2014   Procedure: CATARACT EXTRACTION PHACO AND INTRAOCULAR LENS PLACEMENT ([place]);  Surgeon: Williams Che, MD;  Location: [facility];  Service: Ophthalmology;  Laterality: Right;  CDE:2.83   CATARACT EXTRACTION W/PHACO Left 07/27/2014   Procedure: CATARACT EXTRACTION PHACO AND INTRAOCULAR LENS PLACEMENT LEFT EYE CDE=6.97;  Surgeon: Williams Che, MD;  Location: [facility];  Service: Ophthalmology;  Laterality: Left;   COLONOSCOPY  2009   negative; prior study with adenomatous polyp   CYSTOSCOPY/URETEROSCOPY/HOLMIUM LASER/STENT PLACEMENT Right 08/21/2016   Procedure: CYSTOSCOPY/URETEROSCOPY/RETROGRADE PYELOGRAM//STENT PLACEMENT;  Surgeon: Nickie Retort, MD;  Location: [facility];  Service: Urology;  Laterality: Right;   EYE SURGERY     INTRAOPERATIVE TRANSTHORACIC ECHOCARDIOGRAM N/A 05/31/2021   Procedure: INTRAOPERATIVE TRANSTHORACIC ECHOCARDIOGRAM;  Surgeon: Sherren Mocha, MD;  Location: [place];  Service: Open Heart Surgery;  Laterality: N/A;   RIGHT/LEFT HEART CATH AND CORONARY ANGIOGRAPHY N/A 02/04/2021   Procedure: RIGHT/LEFT HEART CATH AND CORONARY ANGIOGRAPHY;  Surgeon: Sherren Mocha, MD;  Location: [place] CV [facility];  Service: Cardiovascular;  Laterality: N/A;   TRANSCATHETER AORTIC VALVE REPLACEMENT, TRANSFEMORAL N/A 05/31/2021   Procedure: Transcatheter Aortic Valve Replacement 62m, Transfemoral;  Surgeon: CSherren Mocha MD;  Location: [facility]  Service: Open Heart Surgery;  Laterality: N/A;  Percutaneous     FAMILY HISTORY: Family History  Problem Relation Age of Onset   Cancer Father    Arrhythmia Sister        Atrial fibrillation    SOCIAL HISTORY: Social History   Socioeconomic History   Marital status: Married    Spouse name: Not on file   Number of children:  2   Years of education: Not on file   Highest education level: Not on file  Occupational History   Occupation: Retired  Tobacco Use   Smoking status: Never    Passive exposure: Never   Smokeless tobacco: Never  Vaping Use   Vaping Use: Never used  Substance and Sexual Activity   Alcohol use: No   Drug use: No   Sexual activity: Not Currently  Other Topics Concern   Not on file  Social History Narrative   Married with 2 children   No regular exercise   Social Determinants of Health   Financial Resource Strain: Not on file  Food Insecurity: Not on file  Transportation Needs: Not on file  Physical Activity:  Not on file  Stress: Not on file  Social Connections: Not on file  Intimate Partner Violence: Not on file    PHYSICAL EXAM  GENERAL EXAM/CONSTITUTIONAL: Vitals:  Vitals:   04/10/22 0959  BP: 116/77  Pulse: 72  Weight: 158 lb (71.7 kg)  Height: 5' 1.6" (1.565 m)   Body mass index is 29.28 kg/m. Wt Readings from Last 3 Encounters:  04/10/22 158 lb (71.7 kg)  02/10/22 163 lb 9.3 oz (74.2 kg)  12/08/21 150 lb (68 kg)   Patient is in no distress; well developed, nourished and groomed; neck is supple  EYES: Visual fields full to confrontation, Extraocular movements intacts,  No results found.  MUSCULOSKELETAL: Gait, strength, tone, movements noted in Neurologic exam below  NEUROLOGIC: MENTAL STATUS:      No data to display         awake, alert, oriented to person, place and time Difficulty with recent memory normal attention and concentration language fluent, comprehension intact, naming intact fund of knowledge appropriate  CRANIAL NERVE:  2nd, 3rd, 4th, 6th - Visual  fields full to confrontation, extraocular muscles intact, no nystagmus 5th - facial sensation symmetric 7th - facial strength symmetric 8th - hearing intact 9th - palate elevates symmetrically, uvula midline 11th - shoulder shrug symmetric 12th - tongue protrusion midline  MOTOR:  normal bulk and tone, full strength in the BUE, BLE  SENSORY:  normal and symmetric to light touch  COORDINATION:  finger-nose-finger, fine finger movements normal  REFLEXES:  deep tendon reflexes present and symmetric  GAIT/STATION:  normal   DIAGNOSTIC DATA (LABS, IMAGING, TESTING) - I reviewed patient records, labs, notes, testing and imaging myself where available.  Lab Results  Component Value Date   WBC 9.0 02/12/2022   HGB 12.1 02/12/2022   HCT 37.6 02/12/2022   MCV 90.8 02/12/2022   PLT 146 (L) 02/12/2022      Component Value Date/Time   NA 140 02/12/2022 0421   NA 144 01/18/2021 1655   K 3.7 02/12/2022 0421   CL 104 02/12/2022 0421   CO2 25 02/12/2022 0421   GLUCOSE 123 (H) 02/12/2022 0421   BUN 7 (L) 02/12/2022 0421   BUN 10 01/18/2021 1655   CREATININE 0.92 02/12/2022 0421   CALCIUM 9.1 02/12/2022 0421   PROT 7.9 02/10/2022 1401   ALBUMIN 4.2 02/10/2022 1401   AST 34 02/10/2022 1401   ALT 20 02/10/2022 1401   ALKPHOS 41 02/10/2022 1401   BILITOT 0.8 02/10/2022 1401   GFRNONAA >60 02/12/2022 0421   GFRAA >60 11/28/2017 1127   Lab Results  Component Value Date   CHOL 183 02/11/2022   HDL 58 02/11/2022   LDLCALC 112 (H) 02/11/2022   TRIG 64 02/11/2022   Lab Results  Component Value Date   HGBA1C 6.3 (H) 02/11/2022   No results found for: "VITAMINB12" Lab Results  Component Value Date   TSH 2.32 03/24/2008    MRI Brain 02/11/2022 No acute intracranial pathology. No acute hemorrhage or infarct identified.   Routine EEG 02/11/2022 Normal EEGs, however, do not rule out epilepsy.    I personally reviewed brain Images and previous EEG reports.   ASSESSMENT  AND PLAN  89 y.o. year old female  with history of atrial fibrillation, hypertension, hyperlipidemia, diabetes, previous stroke in December 2022 who is presenting after new onset seizure.  She is currently on Keppra 500 mg twice daily, denies any additional seizures and no side effects on the medication.  At the moment  we will continue patient on Keppra 500 mg twice daily, I will also obtain a Keppra level.  I will follow-up with her in 6 months, at that time we will do a more complete memory evaluation.  This was explained to the patient and husband and they are comfortable with plan.  We also discussed driving restriction for next 85-month and I advised her to continue with Eliquis.   [place]. [place]   2. [place]brovascular [place] ([place]   3. Therapeutic drug monitoring     Patient Instructions  Continue with Keppra 500 mg twice daily, will check a Keppra level today Continue your other medications Driving restriction for next 6 months Follow-up with 6 months or sooner if worse    Per [facility], patients with seizures are not allowed to drive until they have been seizure-free for six months.  Other recommendations include using caution when using heavy equipment or power tools. Avoid working on ladders or at heights. Take showers instead of baths.  Do not swim alone.  Ensure the water temperature is not too high on the home water heater. Do not go swimming alone. Do not lock yourself in a room alone (i.e. bathroom). When caring for infants or small children, sit down when holding, feeding, or changing them to minimize risk of injury to the child in the event you have a seizure. Maintain good sleep hygiene. Avoid alcohol.  Also recommend adequate sleep, hydration, good diet and minimize stress.   During the Seizure  - First, ensure adequate ventilation and place patients on the floor on their left side  Loosen clothing around the neck and ensure  the airway is patent. If the patient is clenching the teeth, do not force the mouth open with any object as this can cause severe damage - Remove all items from the surrounding that can be hazardous. The patient may be oblivious to what's happening and may not even know what he or she is doing. If the patient is confused and wandering, either gently guide him/her away and block access to outside areas - Reassure the individual and be comforting - Call 911. In most cases, the seizure ends before EMS arrives. However, there are cases when seizures may last over 3 to 5 minutes. Or the individual may have developed breathing difficulties or severe injuries. If a pregnant patient or a person with diabetes develops a seizure, it is prudent to call an ambulance. - Finally, if the patient does not regain full consciousness, then call EMS. Most patients will remain confused for about 45 to 90 minutes after a seizure, so you must use judgment in calling for help. - Avoid restraints but make sure the patient is in a bed with padded side rails - Place the individual in a lateral position with the neck slightly flexed; this will help the saliva drain from the mouth and prevent the tongue from falling backward - Remove all nearby furniture and other hazards from the area - Provide verbal assurance as the individual is regaining consciousness - Provide the patient with privacy if possible - Call for help and start treatment as ordered by the caregiver   After the Seizure (Postictal Stage)  After a seizure, most patients experience confusion, fatigue, muscle pain and/or a headache. Thus, one should permit the individual to sleep. For the next few days, reassurance is essential. Being calm and helping reorient the person is also of importance.  Most seizures are painless and end  spontaneously. Seizures are not harmful to others but can lead to complications such as stress on the lungs, brain and the heart.  Individuals with prior lung problems may develop labored breathing and respiratory distress.     Orders Placed This Encounter  Procedures   Levetiracetam level    Meds ordered this encounter  Medications   levETIRAcetam (KEPPRA) 500 MG tablet    Sig: Take 1 tablet (500 mg total) by mouth 2 (two) times daily.    Dispense:  180 tablet    Refill:  3   apixaban (ELIQUIS) 5 MG TABS tablet    Sig: Take 1 tablet (5 mg total) by mouth 2 (two) times daily.    Dispense:  60 tablet    Refill:  11    Return in about 6 months (around 10/09/2022).    Alric Ran, MD 04/10/2022, 5:31 PM  [facility] [address] [place], [place] [address] [phone]

## 2022-04-11 ENCOUNTER — Telehealth: Payer: Self-pay

## 2022-04-11 LAB — LEVETIRACETAM LEVEL: Levetiracetam Lvl: 19.1 ug/mL (ref 10.0–40.0)

## 2022-04-11 NOTE — Telephone Encounter (Signed)
Received call from husband- patient has seen neurology and requested new surgical clearance to be sent over to fax # [phone]  New letter sent for Eliquis approval hold

## 2022-04-13 ENCOUNTER — Encounter: Payer: Self-pay | Admitting: Neurology

## 2022-04-13 ENCOUNTER — Telehealth: Payer: Self-pay | Admitting: *Deleted

## 2022-04-13 NOTE — Telephone Encounter (Signed)
Surgical clearance for cystoscopy with retrograd pyelogram first week in March with Dr. Alyson Ingles.  Is asking for clearance.  You saw pt yesterday 04-10-2022 ordered eliquis.  (From request asking for cardiology but sent to [place] with your name).  Give clearance from neurology?

## 2022-04-13 NOTE — Telephone Encounter (Signed)
Completed.

## 2022-04-13 NOTE — Telephone Encounter (Signed)
Fax confirmation received to [facility] [place] [phone] (letter for clearance).  [phone]

## 2022-04-14 ENCOUNTER — Telehealth: Payer: Self-pay

## 2022-04-14 NOTE — Telephone Encounter (Signed)
Received neurology approval hold for Eliquis 2 days prior to surgery.  I spoke with Anne Wolf. We have discussed possible surgery dates and 04/27/2022 was agreed upon by all parties. Patient given information about surgery date, what to expect pre-operatively and post operatively.    We discussed that a pre-op nurse will be calling to set up the pre-op visit that will take place prior to surgery. Informed patient that our office will communicate any additional care to be provided after surgery.    Patients questions or concerns were discussed during our call. Advised to call our office should there be any additional information, questions or concerns that arise. Patient verbalized understanding.

## 2022-04-19 DIAGNOSIS — Z85828 Personal history of other malignant neoplasm of skin: Secondary | ICD-10-CM | POA: Diagnosis not present

## 2022-04-19 DIAGNOSIS — D239 Other benign neoplasm of skin, unspecified: Secondary | ICD-10-CM | POA: Diagnosis not present

## 2022-04-19 DIAGNOSIS — L570 Actinic keratosis: Secondary | ICD-10-CM | POA: Diagnosis not present

## 2022-04-24 NOTE — Patient Instructions (Signed)
Anne Wolf  04/24/2022     '@PREFPERIOPPHARMACY'$ @   Your procedure is scheduled on  04/27/2022.   Report to [facility] at  1130  A.M.   Call this number if you have problems the morning of surgery:  [phone]  If you experience any cold or flu symptoms such as cough, fever, chills, shortness of breath, etc. between now and your scheduled surgery, please notify us at the above number.   Remember:  Do not eat or drink after midnight.       DO NOT take any medications for diabetes the morning of your procedure.      Take these medicines the morning of surgery with A SIP OF WATER                                 keppra, metoprolol.     Do not wear jewelry, make-up or nail polish.  Do not wear lotions, powders, or perfumes, or deodorant.  Do not shave 48 hours prior to surgery.  Men may shave face and neck.  Do not bring valuables to the hospital.  [facility] is not responsible for any belongings or valuables.  Contacts, dentures or bridgework may not be worn into surgery.  Leave your suitcase in the car.  After surgery it may be brought to your room.  For patients admitted to the hospital, discharge time will be determined by your treatment team.  Patients discharged the day of surgery will not be allowed to drive home and must have someone with them for 24 hours.    Special instructions:   DO NOT smoke tobacco or vape for 24 hours before your procedure.  Please read over the following fact sheets that you were given. Coughing and Deep Breathing, Surgical Site Infection Prevention, Anesthesia Post-op Instructions, and Care and Recovery After Surgery      Ureteral Stent Implantation, Care After The following information offers guidance on how to care for yourself after your procedure. Your health care provider may also give you more specific instructions. If you have problems or questions, contact your health care provider. What can I expect after the  procedure? After the procedure, it is common to have: Nausea. Mild pain when you urinate. You may feel this pain in your lower back or lower abdomen. The pain should stop within a few minutes after you urinate. This pattern may last for up to 1 week. A small amount of blood in your urine for several days. Follow these instructions at home: Medicines Take over-the-counter and prescription medicines only as told by your health care provider. If you were prescribed antibiotics, take them as told by your health care provider. Do not stop using the antibiotic even if you start to feel better. If you were given a sedative during the procedure, it can affect you for several hours. Do not drive or operate machinery until your health care provider says that it is safe. Ask your health care provider if the medicine prescribed to you: Requires you to avoid driving or using machinery. Can cause constipation. You may need to take these actions to prevent or treat constipation: Take over-the-counter or prescription medicines. Eat foods that are high in fiber, such as beans, whole grains, and fresh fruits and vegetables. Limit foods that are high in fat and processed sugars, such as fried or sweet foods. Activity Rest as told by  your health care provider. Do not sit for a long time without moving. Get up to take short walks every 1-2 hours. This will improve blood flow and breathing. Ask for help if you feel weak or unsteady. Return to your normal activities as told by your health care provider. Ask your health care provider what activities are safe for you. General instructions  If you have a catheter: Follow instructions from your health care provider about taking care of your catheter and collection bag. Do not Drink enough fluid to keep your urine pale yellow. Do not use any products that contain nicotine or tobacco. These products include cigarettes, chewing tobacco, and vaping devices, such as  e-cigarettes. These can delay healing after surgery. If you need help quitting, ask your health care provider. Keep all follow-up visits. Contact a health care provider if: You start passing blood clots, or you have more than a small amount of blood in your urine. You have pain that gets worse or does not get better with medicine, especially pain when you urinate. You have trouble urinating. You feel nauseous or you vomit again and again during a period of more than 2 days after the procedure. You have a fever. Get help right away if: You are passing blood clots that are 1 inch (2.5 cm) or larger in size. You are leaking urine (have incontinence), or you cannot urinate. The end of the stent comes out of your urethra. You have sudden, sharp, or severe pain in your abdomen or lower back. You have swelling or pain in your legs. You have trouble breathing. These symptoms may be an emergency. Get help right away. Call 911. Do not wait to see if the symptoms will go away. Do not drive yourself to the hospital. Summary After the procedure, it is common to have mild pain when you urinate that goes away within a few minutes after you urinate. This may last for up to 1 week. Take over-the-counter and prescription medicines only as told by your health care provider. Drink enough fluid to keep your urine pale yellow. Call your health care provider if you start passing blood clots, or you have more than a small amount of blood in your urine. This information is not intended to replace advice given to you by your health care provider. Make sure you discuss any questions you have with your health care provider. Document Revised: 03/14/2021 Document Reviewed: 03/14/2021 Elsevier Patient Education  [place] Anesthesia, Adult, Care After The following information offers guidance on how to care for yourself after your procedure. Your health care provider may also give you more specific  instructions. If you have problems or questions, contact your health care provider. What can I expect after the procedure? After the procedure, it is common for people to: Have pain or discomfort at the IV site. Have nausea or vomiting. Have a sore throat or hoarseness. Have trouble concentrating. Feel cold or chills. Feel weak, sleepy, or tired (fatigue). Have soreness and body aches. These can affect parts of the body that were not involved in surgery. Follow these instructions at home: For the time period you were told by your health care provider:  Rest. Do not participate in activities where you could fall or become injured. Do not drive or use machinery. Do not drink alcohol. Do not take sleeping pills or medicines that cause drowsiness. Do not make important decisions or sign legal documents. Do not take care of children on your own.  General instructions Drink enough fluid to keep your urine pale yellow. If you have sleep apnea, surgery and certain medicines can increase your risk for breathing problems. Follow instructions from your health care provider about wearing your sleep device: Anytime you are sleeping, including during daytime naps. While taking prescription pain medicines, sleeping medicines, or medicines that make you drowsy. Return to your normal activities as told by your health care provider. Ask your health care provider what activities are safe for you. Take over-the-counter and prescription medicines only as told by your health care provider. Do not use any products that contain nicotine or tobacco. These products include cigarettes, chewing tobacco, and vaping devices, such as e-cigarettes. These can delay incision healing after surgery. If you need help quitting, ask your health care provider. Contact a health care provider if: You have nausea or vomiting that does not get better with medicine. You vomit every time you eat or drink. You have pain that does  not get better with medicine. You cannot urinate or have bloody urine. You develop a skin rash. You have a fever. Get help right away if: You have trouble breathing. You have chest pain. You vomit blood. These symptoms may be an emergency. Get help right away. Call 911. Do not wait to see if the symptoms will go away. Do not drive yourself to the hospital. Summary After the procedure, it is common to have a sore throat, hoarseness, nausea, vomiting, or to feel weak, sleepy, or fatigue. For the time period you were told by your health care provider, do not drive or use machinery. Get help right away if you have difficulty breathing, have chest pain, or vomit blood. These symptoms may be an emergency. This information is not intended to replace advice given to you by your health care provider. Make sure you discuss any questions you have with your health care provider. Document Revised: 05/06/2021 Document Reviewed: 05/06/2021 Elsevier Patient Education  [place].

## 2022-04-25 ENCOUNTER — Encounter (HOSPITAL_COMMUNITY): Payer: Self-pay

## 2022-04-25 ENCOUNTER — Encounter (HOSPITAL_COMMUNITY)
Admission: RE | Admit: 2022-04-25 | Discharge: 2022-04-25 | Disposition: A | Discharge status: Home / Self Care | Payer: No Typology Code available for payment source | Source: Ambulatory Visit | Attending: Urology | Admitting: Urology

## 2022-04-25 VITALS — BP 121/74 | HR 82 | Temp 98.4°F | Resp 18 | Ht 61.6 in | Wt 158.0 lb

## 2022-04-25 DIAGNOSIS — Z01812 Encounter for preprocedural laboratory examination: Secondary | ICD-10-CM | POA: Insufficient documentation

## 2022-04-25 DIAGNOSIS — E119 Type 2 diabetes mellitus without complications: Secondary | ICD-10-CM | POA: Diagnosis not present

## 2022-04-25 LAB — BASIC METABOLIC PANEL
Anion gap: 8 (ref 5–15)
BUN: 14 mg/dL (ref 8–23)
CO2: 25 mmol/L (ref 22–32)
Calcium: 8.7 mg/dL — ABNORMAL LOW (ref 8.9–10.3)
Chloride: 105 mmol/L (ref 98–111)
Creatinine, Ser: 0.84 mg/dL (ref 0.44–1.00)
GFR, Estimated: >60 mL/min (ref >60)
Glucose, Bld: 103 mg/dL — ABNORMAL HIGH (ref 70–99)
Potassium: 3.6 mmol/L (ref 3.5–5.1)
Sodium: 138 mmol/L (ref 135–145)

## 2022-04-26 LAB — HEMOGLOBIN A1C
Hgb A1c MFr Bld: 6.4 % — ABNORMAL HIGH (ref 4.8–5.6)
Mean Plasma Glucose: 137 mg/dL

## 2022-04-27 ENCOUNTER — Ambulatory Visit (HOSPITAL_COMMUNITY): Payer: No Typology Code available for payment source | Admitting: Certified Registered"

## 2022-04-27 ENCOUNTER — Encounter (HOSPITAL_COMMUNITY): Payer: Self-pay | Admitting: Urology

## 2022-04-27 ENCOUNTER — Ambulatory Visit (HOSPITAL_BASED_OUTPATIENT_CLINIC_OR_DEPARTMENT_OTHER): Payer: No Typology Code available for payment source | Admitting: Certified Registered"

## 2022-04-27 ENCOUNTER — Ambulatory Visit (HOSPITAL_COMMUNITY): Payer: No Typology Code available for payment source

## 2022-04-27 ENCOUNTER — Encounter (HOSPITAL_COMMUNITY)
Admission: RE | Disposition: A | Discharge status: Home / Self Care | Payer: Self-pay | Source: Ambulatory Visit | Attending: Urology

## 2022-04-27 ENCOUNTER — Ambulatory Visit (HOSPITAL_COMMUNITY)
Admission: RE | Admit: 2022-04-27 | Discharge: 2022-04-27 | Disposition: A | Discharge status: Home / Self Care | Payer: No Typology Code available for payment source | Source: Ambulatory Visit | Attending: Urology | Admitting: Urology

## 2022-04-27 DIAGNOSIS — E785 Hyperlipidemia, unspecified: Secondary | ICD-10-CM | POA: Insufficient documentation

## 2022-04-27 DIAGNOSIS — Z8673 Personal history of transient ischemic attack (TIA), and cerebral infarction without residual deficits: Secondary | ICD-10-CM

## 2022-04-27 DIAGNOSIS — E119 Type 2 diabetes mellitus without complications: Secondary | ICD-10-CM | POA: Diagnosis not present

## 2022-04-27 DIAGNOSIS — I4891 Unspecified atrial fibrillation: Secondary | ICD-10-CM | POA: Insufficient documentation

## 2022-04-27 DIAGNOSIS — Z7984 Long term (current) use of oral hypoglycemic drugs: Secondary | ICD-10-CM | POA: Insufficient documentation

## 2022-04-27 DIAGNOSIS — Z79899 Other long term (current) drug therapy: Secondary | ICD-10-CM | POA: Insufficient documentation

## 2022-04-27 DIAGNOSIS — Z87442 Personal history of urinary calculi: Secondary | ICD-10-CM | POA: Insufficient documentation

## 2022-04-27 DIAGNOSIS — N3281 Overactive bladder: Secondary | ICD-10-CM | POA: Insufficient documentation

## 2022-04-27 DIAGNOSIS — Z7901 Long term (current) use of anticoagulants: Secondary | ICD-10-CM | POA: Diagnosis not present

## 2022-04-27 DIAGNOSIS — I509 Heart failure, unspecified: Secondary | ICD-10-CM | POA: Diagnosis not present

## 2022-04-27 DIAGNOSIS — I350 Nonrheumatic aortic (valve) stenosis: Secondary | ICD-10-CM | POA: Diagnosis not present

## 2022-04-27 DIAGNOSIS — I110 Hypertensive heart disease with heart failure: Secondary | ICD-10-CM

## 2022-04-27 DIAGNOSIS — N200 Calculus of kidney: Secondary | ICD-10-CM | POA: Insufficient documentation

## 2022-04-27 DIAGNOSIS — Z952 Presence of prosthetic heart valve: Secondary | ICD-10-CM | POA: Insufficient documentation

## 2022-04-27 HISTORY — PX: HOLMIUM LASER APPLICATION: SHX5852

## 2022-04-27 HISTORY — PX: CYSTOSCOPY WITH RETROGRADE PYELOGRAM, URETEROSCOPY AND STENT PLACEMENT: SHX5789

## 2022-04-27 LAB — GLUCOSE, CAPILLARY: Glucose-Capillary: 89 mg/dL (ref 70–99)

## 2022-04-27 SURGERY — CYSTOURETEROSCOPY, WITH RETROGRADE PYELOGRAM AND STENT INSERTION
Anesthesia: General | Site: Renal | Laterality: Left

## 2022-04-27 MED ORDER — CEFAZOLIN SODIUM-DEXTROSE 2-4 GM/100ML-% IV SOLN
2.0000 g | INTRAVENOUS | Status: AC
  Administered 2022-04-27: 2 g via INTRAVENOUS

## 2022-04-27 MED ORDER — SODIUM CHLORIDE 0.9 % IR SOLN
Status: DC | PRN
  Administered 2022-04-27: 3000 mL

## 2022-04-27 MED ORDER — DEXAMETHASONE SODIUM PHOSPHATE 4 MG/ML IJ SOLN
INTRAMUSCULAR | Status: DC | PRN
  Administered 2022-04-27: 5 mg via INTRAVENOUS

## 2022-04-27 MED ORDER — HYDROCODONE-ACETAMINOPHEN 5-325 MG PO TABS: 1.0000 | ORAL_TABLET | Freq: Four times a day (QID) | ORAL | 0 refills | Status: DC | PRN

## 2022-04-27 MED ORDER — GLYCOPYRROLATE PF 0.2 MG/ML IJ SOSY
PREFILLED_SYRINGE | INTRAMUSCULAR | Status: DC | PRN
  Administered 2022-04-27: .1 mg via INTRAVENOUS

## 2022-04-27 MED ORDER — LACTATED RINGERS IV SOLN: INTRAVENOUS | Status: DC | PRN

## 2022-04-27 MED ORDER — PROPOFOL 10 MG/ML IV BOLUS
INTRAVENOUS | Status: DC | PRN
  Administered 2022-04-27: 130 mg via INTRAVENOUS

## 2022-04-27 MED ORDER — WATER FOR IRRIGATION, STERILE IR SOLN
Status: DC | PRN
  Administered 2022-04-27: 1000 mL

## 2022-04-27 MED ORDER — LIDOCAINE 2% (20 MG/ML) 5 ML SYRINGE
INTRAMUSCULAR | Status: DC | PRN
  Administered 2022-04-27: 100 mg via INTRAVENOUS

## 2022-04-27 MED ORDER — PHENYLEPHRINE 80 MCG/ML (10ML) SYRINGE FOR IV PUSH (FOR BLOOD PRESSURE SUPPORT)
PREFILLED_SYRINGE | INTRAVENOUS | Status: AC
  Filled 2022-04-27: qty 10

## 2022-04-27 MED ORDER — PHENYLEPHRINE 80 MCG/ML (10ML) SYRINGE FOR IV PUSH (FOR BLOOD PRESSURE SUPPORT)
PREFILLED_SYRINGE | INTRAVENOUS | Status: DC | PRN
  Administered 2022-04-27 (×5): 160 ug via INTRAVENOUS

## 2022-04-27 MED ORDER — CEFAZOLIN SODIUM-DEXTROSE 2-4 GM/100ML-% IV SOLN
INTRAVENOUS | Status: AC
  Filled 2022-04-27: qty 100

## 2022-04-27 MED ORDER — CHLORHEXIDINE GLUCONATE 0.12 % MT SOLN
OROMUCOSAL | Status: AC
  Filled 2022-04-27: qty 15

## 2022-04-27 MED ORDER — FENTANYL CITRATE (PF) 100 MCG/2ML IJ SOLN
INTRAMUSCULAR | Status: AC
  Filled 2022-04-27: qty 2

## 2022-04-27 MED ORDER — FENTANYL CITRATE (PF) 100 MCG/2ML IJ SOLN
INTRAMUSCULAR | Status: DC | PRN
  Administered 2022-04-27 (×2): 25 ug via INTRAVENOUS

## 2022-04-27 MED ORDER — GLYCOPYRROLATE PF 0.2 MG/ML IJ SOSY
PREFILLED_SYRINGE | INTRAMUSCULAR | Status: AC
  Filled 2022-04-27: qty 1

## 2022-04-27 MED ORDER — ONDANSETRON HCL 4 MG PO TABS: 4.0000 mg | ORAL_TABLET | Freq: Every day | ORAL | 1 refills | Status: DC | PRN

## 2022-04-27 MED ORDER — LIDOCAINE HCL (PF) 2 % IJ SOLN
INTRAMUSCULAR | Status: AC
  Filled 2022-04-27: qty 5

## 2022-04-27 MED ORDER — PROPOFOL 10 MG/ML IV BOLUS
INTRAVENOUS | Status: AC
  Filled 2022-04-27: qty 20

## 2022-04-27 MED ORDER — ONDANSETRON HCL 4 MG/2ML IJ SOLN
INTRAMUSCULAR | Status: DC | PRN
  Administered 2022-04-27: 4 mg via INTRAVENOUS

## 2022-04-27 MED ORDER — TAMSULOSIN HCL 0.4 MG PO CAPS: 0.4000 mg | ORAL_CAPSULE | Freq: Every day | ORAL | 0 refills | Status: DC

## 2022-04-27 MED ORDER — DIATRIZOATE MEGLUMINE 30 % UR SOLN
URETHRAL | Status: DC | PRN
  Administered 2022-04-27: 6 mL via URETHRAL

## 2022-04-27 SURGICAL SUPPLY — 24 items
BAG DRAIN URO TABLE W/ADPT NS (BAG) ×1 IMPLANT
BAG DRN 8 ADPR NS SKTRN CSTL (BAG) ×1
BAG HAMPER (MISCELLANEOUS) ×1 IMPLANT
CATH INTERMIT  6FR 70CM (CATHETERS) ×1 IMPLANT
CLOTH BEACON ORANGE TIMEOUT ST (SAFETY) ×1 IMPLANT
GLOVE BIO SURGEON STRL SZ8 (GLOVE) ×1 IMPLANT
GLOVE BIOGEL PI IND STRL 7.0 (GLOVE) ×2 IMPLANT
GOWN STRL REUS W/TWL LRG LVL3 (GOWN DISPOSABLE) ×1 IMPLANT
GOWN STRL REUS W/TWL XL LVL3 (GOWN DISPOSABLE) ×1 IMPLANT
GUIDEWIRE STR DUAL SENSOR (WIRE) ×1 IMPLANT
GUIDEWIRE STR ZIPWIRE 035X150 (MISCELLANEOUS) ×1 IMPLANT
IV NS IRRIG 3000ML ARTHROMATIC (IV SOLUTION) ×2 IMPLANT
KIT TURNOVER CYSTO (KITS) ×1 IMPLANT
MANIFOLD NEPTUNE II (INSTRUMENTS) ×1 IMPLANT
PACK CYSTO (CUSTOM PROCEDURE TRAY) ×1 IMPLANT
PAD ARMBOARD 7.5X6 YLW CONV (MISCELLANEOUS) ×1 IMPLANT
SHEATH URETERAL 12FRX35CM (MISCELLANEOUS) IMPLANT
STENT URET 6FRX26 CONTOUR (STENTS) IMPLANT
SYR 10ML LL (SYRINGE) ×1 IMPLANT
SYR CONTROL 10ML LL (SYRINGE) ×1 IMPLANT
TOWEL OR 17X26 4PK STRL BLUE (TOWEL DISPOSABLE) ×1 IMPLANT
TRACTIP FLEXIVA PULS ID 200XHI (Laser) IMPLANT
TRACTIP FLEXIVA PULSE ID 200 (Laser) ×1
WATER STERILE IRR 500ML POUR (IV SOLUTION) ×1 IMPLANT

## 2022-04-27 NOTE — H&P (Signed)
HPI: [name] is a 89yo here for followup for OAb and nephrolithiasis. CT stone study shows multiple left renal calculi up to 66m. She has intermittent left groin pain. She was given sample of mirabegron '25mg'$  which failed to improve her urinary urgency and frequency.      PMH:     Past Medical History:  Diagnosis Date   Aortic stenosis      mild; mild MR; slightly increased pulmonary artery pressure with mild RVH; normal LV-2010   Arthritis     Atrial fibrillation (HCC)      chronic anticoagulation; adequate HR control on minimal AV nodal blocking medication    Cancer ([facility]      skin cancers removed from face   CHF (congestive heart failure) ([facility]     Diabetes mellitus without complication ([facility]      2   Dyspnea     Fasting hyperglycemia      + microalbuminuria; A1c of 6.5% in 11/2010   Hematochezia 01/2009    01/2009-presumed ischemic colitis; history of diverticulosis   History of kidney stones     Hx of adenomatous colonic polyps 2005    adenomatous polyp; negative colonoscopy in 2009   Hyperlipidemia     Stroke ([facility]        Surgical History:      Past Surgical History:  Procedure Laterality Date   CATARACT EXTRACTION W/PHACO Right 03/23/2014    Procedure: CATARACT EXTRACTION PHACO AND INTRAOCULAR LENS PLACEMENT ([place];  Surgeon: CWilliams Che MD;  Location: [facility];  Service: Ophthalmology;  Laterality: Right;  CDE:2.83   CATARACT EXTRACTION W/PHACO Left 07/27/2014    Procedure: CATARACT EXTRACTION PHACO AND INTRAOCULAR LENS PLACEMENT LEFT EYE CDE=6.97;  Surgeon: CWilliams Che MD;  Location: [facility];  Service: Ophthalmology;  Laterality: Left;   COLONOSCOPY   2009    negative; prior study with adenomatous polyp   CYSTOSCOPY/URETEROSCOPY/HOLMIUM LASER/STENT PLACEMENT Right 08/21/2016    Procedure: CYSTOSCOPY/URETEROSCOPY/RETROGRADE PYELOGRAM//STENT PLACEMENT;  Surgeon: BNickie Retort MD;  Location: [facility];  Service: Urology;  Laterality: Right;   EYE SURGERY        INTRAOPERATIVE TRANSTHORACIC ECHOCARDIOGRAM N/A 05/31/2021    Procedure: INTRAOPERATIVE TRANSTHORACIC ECHOCARDIOGRAM;  Surgeon: CSherren Mocha MD;  [place] [facility]  Service: Open Heart Surgery;  Laterality: N/A;   RIGHT/LEFT HEART CATH AND CORONARY ANGIOGRAPHY N/A 02/04/2021    Procedure: RIGHT/LEFT HEART CATH AND CORONARY ANGIOGRAPHY;  Surgeon: CSherren Mocha MD;  [facility];  Service: Cardiovascular;  Laterality: N/A;   TRANSCATHETER AORTIC VALVE REPLACEMENT, TRANSFEMORAL N/A 05/31/2021    Procedure: Transcatheter Aortic Valve Replacement 263m Transfemoral;  Surgeon: CoSherren MochaMD;  [place] [facility] Service: Open Heart Surgery;  Laterality: N/A;  Percutaneous      Home Medications:  Allergies as of 02/24/2022         Reactions    Cat Hair Extract Other (See Comments)    Red eyes and Sneezing Also allergic to Dogs            Medication List           Accurate as of February 24, 2022 11:53 AM. If you have any questions, ask your nurse or doctor.              alendronate 70 MG tablet Commonly known as: FOSAMAX Take 70 mg by mouth once a week.    apixaban 5 MG Tabs tablet Commonly known as: ELIQUIS Take 1 tablet (5 mg total) by  mouth 2 (two) times daily.    Biotin 5000 MCG Caps Take 10,000 mcg by mouth daily.    Centrum Silver tablet Take 1 tablet by mouth at bedtime.    cholecalciferol 1000 units tablet Commonly known as: VITAMIN D Take 5,000 Units by mouth at bedtime.    levETIRAcetam 500 MG tablet Commonly known as: KEPPRA Take 1 tablet (500 mg total) by mouth 2 (two) times daily.    lisinopril 10 MG tablet Commonly known as: ZESTRIL Take 10 mg by mouth at bedtime.    metFORMIN 500 MG tablet Commonly known as: GLUCOPHAGE Take 500 mg by mouth 2 (two) times daily with a meal.    metoprolol succinate 25 MG 24 hr tablet Commonly known as: TOPROL-XL Take 25 mg by mouth at bedtime.    mirabegron ER 25 MG Tb24 tablet Commonly known  as: MYRBETRIQ Take 1 tablet (25 mg total) by mouth daily.    rosuvastatin 20 MG tablet Commonly known as: CRESTOR Take 1 tablet (20 mg total) by mouth daily.             Allergies:       Allergies  Allergen Reactions   Cat Hair Extract Other (See Comments)      Red eyes and Sneezing Also allergic to Dogs      Family History:      Family History  Problem Relation Age of Onset   Cancer Father     Arrhythmia Sister          Atrial fibrillation      Social History:  reports that she has never smoked. She has never been exposed to tobacco smoke. She has never used smokeless tobacco. She reports that she does not drink alcohol and does not use drugs.   ROS: All other review of systems were reviewed and are negative except what is noted above in HPI   Physical Exam: BP 119/76   Pulse 79   Constitutional:  Alert and oriented, No acute distress. HEENT: [place] AT, moist mucus membranes.  Trachea midline, no masses. Cardiovascular: No clubbing, cyanosis, or edema. Respiratory: Normal respiratory effort, no increased work of breathing. GI: Abdomen is soft, nontender, nondistended, no abdominal masses GU: No CVA tenderness.  Lymph: No cervical or inguinal lymphadenopathy. Skin: No rashes, bruises or suspicious lesions. Neurologic: Grossly intact, no focal deficits, moving all 4 extremities. Psychiatric: Normal mood and affect.   Laboratory Data: Recent Labs       Lab Results  Component Value Date    WBC 9.0 02/12/2022    HGB 12.1 02/12/2022    HCT 37.6 02/12/2022    MCV 90.8 02/12/2022    PLT 146 (L) 02/12/2022        Recent Labs       Lab Results  Component Value Date    CREATININE 0.92 02/12/2022        Recent Labs  No results found for: "PSA"     Recent Labs  No results found for: "TESTOSTERONE"     Recent Labs       Lab Results  Component Value Date    HGBA1C 6.3 (H) 02/11/2022        Urinalysis Labs (Brief)          Component Value  Date/Time    COLORURINE YELLOW 02/10/2022 1950    APPEARANCEUR CLEAR 02/10/2022 1950    APPEARANCEUR Hazy (A) 01/24/2022 1054    LABSPEC 1.042 (H) 02/10/2022 1950    PHURINE 7.0 02/10/2022 1950  GLUCOSEU NEGATIVE 02/10/2022 1950    HGBUR MODERATE (A) 02/10/2022 1950    BILIRUBINUR NEGATIVE 02/10/2022 1950    BILIRUBINUR Negative 01/24/2022 1054    KETONESUR NEGATIVE 02/10/2022 1950    PROTEINUR 30 (A) 02/10/2022 1950    UROBILINOGEN 0.2 08/19/2021 1604    NITRITE NEGATIVE 02/10/2022 1950    LEUKOCYTESUR NEGATIVE 02/10/2022 1950        Recent Labs       Lab Results  Component Value Date    LABMICR See below: 01/24/2022    WBCUA 6-10 (A) 01/24/2022    LABEPIT 0-10 01/24/2022    MUCUS Present (A) 01/24/2022    BACTERIA NONE SEEN 02/10/2022        Pertinent Imaging: CT stone study 02/23/2022: Images reviewed and discussed with the patient  No results found for this or any previous visit.   No results found for this or any previous visit.   No results found for this or any previous visit.   No results found for this or any previous visit.   No results found for this or any previous visit.   No valid procedures specified. No results found for this or any previous visit.   Results for orders placed in visit on 01/24/22   CT RENAL STONE STUDY   Narrative CLINICAL DATA:  Urolithiasis.   EXAM: CT ABDOMEN AND PELVIS WITHOUT CONTRAST   TECHNIQUE: Multidetector CT imaging of the abdomen and pelvis was performed following the standard protocol without IV contrast.   RADIATION DOSE REDUCTION: This exam was performed according to the departmental dose-optimization program which includes automated exposure control, adjustment of the mA and/or kV according to patient size and/or use of iterative reconstruction technique.   COMPARISON:  CT 02/15/2021 and older   FINDINGS: Lower chest: There is linear opacity lung bases likely scar or atelectasis. No pleural effusion.  Status post TAVR. Question some enlargement of the right atrium. Small hiatal hernia.   Hepatobiliary: On this non IV contrast exam liver has preserved contour. Gallbladder is nondilated.   Pancreas: Moderate pancreatic atrophy with some fatty replacement.   Spleen: Spleen is not enlarged.   Adrenals/Urinary Tract: Normal adrenal glands. No abnormal calcification is seen within the right kidney nor along the course of the right ureter. No left ureteral stone. There are least 4 stones in the left renal pelvis with the largest measuring a proximally 9 mm in cephalocaudal length. Slight ectasia of the renal pelvis but no caliectasis. Mild adjacent fat stranding in this location as well. Preserved contour to the urinary bladder.   Stomach/Bowel: On this non oral contrast exam, large bowel has a normal course and caliber with diffuse colonic stool. Normal appendix. Moderate debris in the nondilated stomach. Small bowel is nondilated on this non oral contrast exam. No free air or free fluid.   Vascular/Lymphatic: Normal caliber abdominal aorta and IVC. Scattered atherosclerotic calcified plaque along the aorta and branch vessels. No specific abnormal lymph node enlargement present in the abdomen or pelvis.   Reproductive: Uterus and bilateral adnexa are unremarkable.   Other: No abdominal wall hernia or abnormality. No abdominopelvic ascites.   Musculoskeletal: Curvature and degenerative changes seen of the spine. Scattered degenerative changes of the pelvis as well. There is some posterior osteophytes at the T12-L1 level with some stenosis along the central canal.   IMPRESSION: Multiple nonobstructing left-sided renal pelvis stones. No collecting system dilatation. Mild adjacent fat stranding. No ureteral stones.     Electronically Signed By: Roosevelt Locks  Lyndel Safe M.D. On: 02/23/2022 15:56     Assessment & Plan:     1. Nephrolithiasis -We discussed the management of kidney  stones. These options include observation, ureteroscopy, shockwave lithotripsy (ESWL) and percutaneous nephrolithotomy (PCNL). We discussed which options are relevant to the patient's stone(s). We discussed the natural history of kidney stones as well as the complications of untreated stones and the impact on quality of life without treatment as well as with each of the above listed treatments. We also discussed the efficacy of each treatment in its ability to clear the stone burden. With any of these management options I discussed the signs and symptoms of infection and the need for emergent treatment should these be experienced. For each option we discussed the ability of each procedure to clear the patient of their stone burden.   For observation I described the risks which include but are not limited to silent renal damage, life-threatening infection, need for emergent surgery, failure to pass stone and pain.   For ureteroscopy I described the risks which include bleeding, infection, damage to contiguous structures, positioning injury, ureteral stricture, ureteral avulsion, ureteral injury, need for prolonged ureteral stent, inability to perform ureteroscopy, need for an interval procedure, inability to clear stone burden, stent discomfort/pain, heart attack, stroke, pulmonary embolus and the inherent risks with general anesthesia.   For shockwave lithotripsy I described the risks which include arrhythmia, kidney contusion, kidney hemorrhage, need for transfusion, pain, inability to adequately break up stone, inability to pass stone fragments, Steinstrasse, infection associated with obstructing stones, need for alternate surgical procedure, need for repeat shockwave lithotripsy, MI, CVA, PE and the inherent risks with anesthesia/conscious sedation.   For PCNL I described the risks including positioning injury, pneumothorax, hydrothorax, need for chest tube, inability to clear stone burden, renal  laceration, arterial venous fistula or malformation, need for embolization of kidney, loss of kidney or renal function, need for repeat procedure, need for prolonged nephrostomy tube, ureteral avulsion, MI, CVA, PE and the inherent risks of general anesthesia.   - The patient would like to proceed with left ureteroscopic stone extraction - Urinalysis, Routine w reflex microscopic   2. OAB (overactive bladder) -patient defers therapy at this time.      No follow-ups on file.   Anne Bang, MD   [facility] [place]

## 2022-04-27 NOTE — Anesthesia Procedure Notes (Signed)
Procedure Name: LMA Insertion Date/Time: 04/27/2022 2:14 PM  Performed by: Orlie Dakin, CRNAPre-anesthesia Checklist: Emergency Drugs available, Patient identified, Suction available and Patient being monitored Patient Re-evaluated:Patient Re-evaluated prior to induction Oxygen Delivery Method: Circle system utilized Preoxygenation: Pre-oxygenation with 100% oxygen Induction Type: IV induction Ventilation: Mask ventilation without difficulty LMA: LMA inserted LMA Size: 4.0 Tube type: Oral Number of attempts: 1 Placement Confirmation: positive ETCO2 Tube secured with: Tape Dental Injury: Teeth and Oropharynx as per pre-operative assessment

## 2022-04-27 NOTE — Anesthesia Preprocedure Evaluation (Signed)
Anesthesia Evaluation  Patient identified by MRN, date of birth, ID band Patient awake    Reviewed: Allergy & Precautions, H&P , NPO status , Patient's Chart, lab work & pertinent test results, reviewed documented beta blocker date and time   Airway Mallampati: II  TM Distance: >3 FB Neck ROM: full    Dental no notable dental hx.    Pulmonary neg pulmonary ROS, shortness of breath   Pulmonary exam normal breath sounds clear to auscultation       Cardiovascular Exercise Tolerance: Good hypertension, +CHF  negative cardio ROS + dysrhythmias Atrial Fibrillation  Rhythm:regular Rate:Normal     Neuro/Psych Seizures -,  CVA negative neurological ROS  negative psych ROS   GI/Hepatic negative GI ROS, Neg liver ROS,,,  Endo/Other  negative endocrine ROSdiabetes    Renal/GU Renal diseasenegative Renal ROS  negative genitourinary   Musculoskeletal   Abdominal   Peds  Hematology negative hematology ROS (+)   Anesthesia Other Findings   Reproductive/Obstetrics negative OB ROS                             Anesthesia Physical Anesthesia Plan  ASA: 3  Anesthesia Plan: General and General ETT   Post-op Pain Management:    Induction:   PONV Risk Score and Plan: Ondansetron  Airway Management Planned:   Additional Equipment:   Intra-op Plan:   Post-operative Plan:   Informed Consent: I have reviewed the patients History and Physical, chart, labs and discussed the procedure including the risks, benefits and alternatives for the proposed anesthesia with the patient or authorized representative who has indicated his/her understanding and acceptance.     Dental Advisory Given  Plan Discussed with: CRNA  Anesthesia Plan Comments:        Anesthesia Quick Evaluation

## 2022-04-27 NOTE — Op Note (Signed)
.  Preoperative diagnosis: Left renal stones  Postoperative diagnosis: Same  Procedure: 1 cystoscopy 2. Left retrograde pyelography 3.  Intraoperative fluoroscopy, under one hour, with interpretation 4.  Left ureteroscopic stone manipulation with laser lithotripsy 5.  Left 6 x 26 JJ stent placement  Attending: Rosie Fate  Anesthesia: General  Estimated blood loss: None  Drains: Left 6 x 26 JJ ureteral stent without tether  Specimens: none  Antibiotics: ancef  Findings: left lower pole and UPJ stone. No hydronephrosis. No masses/lesions in the bladder. Ureteral orifices in normal anatomic location.  Indications: Patient is a 89 year old female with a history of left renal stone and who has persistent left flank pain.  After discussing treatment options, she decided proceed with left ureteroscopic stone manipulation.  Procedure in detail: The patient was brought to the operating room and a brief timeout was done to ensure correct patient, correct procedure, correct site.  General anesthesia was administered patient was placed in dorsal lithotomy position.  Her genitalia was then prepped and draped in usual sterile fashion.  A rigid 46 French cystoscope was passed in the urethra and the bladder.  Bladder was inspected free masses or lesions.  the ureteral orifices were in the normal orthotopic locations.  a 6 french ureteral catheter was then instilled into the left ureteral orifice.  a gentle retrograde was obtained and findings noted above.  we then placed a zip wire through the ureteral catheter and advanced up to the renal pelvis.  we then removed the cystoscope and cannulated the left ureteral orifice with a semirigid ureteroscope.  No stone was found in the ureter. Once we reached the UPJ a sensor wire was advanced in to the renal pelvis. We then removed the ureteroscope and advanced am 12/14 x 38cm access sheath up to the renal pelvis. We then used the flexible ureteroscope to  perform nephroscopy. We encountered the stone at the UPJ and in the lower pole. Using a 242nm laser fiber the stones were dusted.   Once the stone were dusted we then removed the access sheath under direct vision and noted no injury to the ureter. We then placed a 6 x 26 double-j ureteral stent over the original zip wire.  We then removed the wire and good coil was noted in the the renal pelvis under fluoroscopy and the bladder under direct vision. the bladder was then drained and this concluded the procedure which was well tolerated by patient.  Complications: None  Condition: Stable, extubated, transferred to PACU  Plan: Patient is to be discharged home as to follow-up in one week. She is to remove her stent in 72 hours by pulling the tether

## 2022-04-27 NOTE — Transfer of Care (Signed)
Immediate Anesthesia Transfer of Care Note  Patient: Anne Wolf  Procedure(s) Performed: CYSTOSCOPY WITH RETROGRADE PYELOGRAM, URETEROSCOPY AND STENT PLACEMENT (Left: Renal) HOLMIUM LASER APPLICATION (Left: Renal)  Patient Location: PACU  Anesthesia Type:General  Level of Consciousness: drowsy  Airway & Oxygen Therapy: Patient Spontanous Breathing and Patient connected to face mask oxygen  Post-op Assessment: Report given to RN and Post -op Vital signs reviewed and stable  Post vital signs: Reviewed and stable  Last Vitals:  Vitals Value Taken Time  BP    Temp    Pulse    Resp    SpO2      Last Pain:  Vitals:   04/27/22 1214  TempSrc: Oral         Complications: No notable events documented.

## 2022-04-28 NOTE — Anesthesia Postprocedure Evaluation (Signed)
Anesthesia Post Note  Patient: Anne Wolf  Procedure(s) Performed: CYSTOSCOPY WITH RETROGRADE PYELOGRAM, URETEROSCOPY AND STENT PLACEMENT (Left: Renal) HOLMIUM LASER APPLICATION (Left: Renal)  Patient location during evaluation: Phase II Anesthesia Type: General Level of consciousness: awake Pain management: pain level controlled Vital Signs Assessment: post-procedure vital signs reviewed and stable Respiratory status: spontaneous breathing and respiratory function stable Cardiovascular status: blood pressure returned to baseline and stable Postop Assessment: no headache and no apparent nausea or vomiting Anesthetic complications: no Comments: Late entry   No notable events documented.   Last Vitals:  Vitals:   04/27/22 1530 04/27/22 1545  BP: 135/79 139/76  Pulse: 75 85  Resp: 15 18  Temp:  (!) 36.3 C  SpO2: 91% 93%    Last Pain:  Vitals:   04/27/22 1545  TempSrc: Oral  PainSc: 0-No pain                 Louann Sjogren

## 2022-05-09 ENCOUNTER — Encounter (HOSPITAL_COMMUNITY): Payer: Self-pay | Admitting: Urology

## 2022-05-10 ENCOUNTER — Ambulatory Visit: Payer: No Typology Code available for payment source | Admitting: Urology

## 2022-05-10 VITALS — BP 133/69 | HR 93

## 2022-05-10 DIAGNOSIS — N200 Calculus of kidney: Secondary | ICD-10-CM | POA: Diagnosis not present

## 2022-05-10 NOTE — Progress Notes (Unsigned)
05/10/2022 3:40 PM   Anne Wolf 1934-01-30 [id number]  Referring provider: Asencion Noble, MD [address] [place],  [place] [address]  No chief complaint on file.   HPI:    PMH: Past Medical History:  Diagnosis Date   Aortic stenosis    mild; mild MR; slightly increased pulmonary artery pressure with mild RVH; normal LV-2010   Arthritis    Atrial fibrillation (HCC)    chronic anticoagulation; adequate HR control on minimal AV nodal blocking medication    Cancer ([place])    skin cancers removed from face   CHF (congestive heart failure) ([place])    Diabetes mellitus without complication ([place])    2   Dyspnea    Dysrhythmia    Fasting hyperglycemia    + microalbuminuria; A1c of 6.5% in 11/2010   Hematochezia 01/2009   01/2009-presumed ischemic colitis; history of diverticulosis   History of kidney stones    Hx of adenomatous colonic polyps 2005   adenomatous polyp; negative colonoscopy in 2009   Hyperlipidemia    Seizure ([place]) 01/2022   Stroke [facility])     Surgical History: Past Surgical History:  Procedure Laterality Date   CATARACT EXTRACTION W/PHACO Right 03/23/2014   Procedure: CATARACT EXTRACTION PHACO AND INTRAOCULAR LENS PLACEMENT ([place]);  Surgeon: Williams Che, MD;  Location: [facility];  Service: Ophthalmology;  Laterality: Right;  CDE:2.83   CATARACT EXTRACTION W/PHACO Left 07/27/2014   Procedure: CATARACT EXTRACTION PHACO AND INTRAOCULAR LENS PLACEMENT LEFT EYE CDE=6.97;  Surgeon: Williams Che, MD;  Location: [facility];  Service: Ophthalmology;  Laterality: Left;   COLONOSCOPY  2009   negative; prior study with adenomatous polyp   CYSTOSCOPY WITH RETROGRADE PYELOGRAM, URETEROSCOPY AND STENT PLACEMENT Left 04/27/2022   Procedure: CYSTOSCOPY WITH RETROGRADE PYELOGRAM, URETEROSCOPY AND STENT PLACEMENT;  Surgeon: Cleon Gustin, MD;  Location: [facility];  Service: Urology;  Laterality: Left;   CYSTOSCOPY/URETEROSCOPY/HOLMIUM LASER/STENT PLACEMENT Right  08/21/2016   Procedure: CYSTOSCOPY/URETEROSCOPY/RETROGRADE PYELOGRAM//STENT PLACEMENT;  Surgeon: Nickie Retort, MD;  Location: [facility];  Service: Urology;  Laterality: Right;   EYE SURGERY     HOLMIUM LASER APPLICATION Left [id number]   Procedure: HOLMIUM LASER APPLICATION;  Surgeon: Cleon Gustin, MD;  Location: [facility];  Service: Urology;  Laterality: Left;   INTRAOPERATIVE TRANSTHORACIC ECHOCARDIOGRAM N/A 05/31/2021   Procedure: INTRAOPERATIVE TRANSTHORACIC ECHOCARDIOGRAM;  Surgeon: Sherren Mocha, MD;  Location: [place];  Service: Open Heart Surgery;  Laterality: N/A;   RIGHT/LEFT HEART CATH AND CORONARY ANGIOGRAPHY N/A 02/04/2021   Procedure: RIGHT/LEFT HEART CATH AND CORONARY ANGIOGRAPHY;  Surgeon: Sherren Mocha, MD;  Location: [place] [facility];  Service: Cardiovascular;  Laterality: N/A;   TRANSCATHETER AORTIC VALVE REPLACEMENT, TRANSFEMORAL N/A 05/31/2021   Procedure: Transcatheter Aortic Valve Replacement 75mm, Transfemoral;  Surgeon: Sherren Mocha, MD;  Location: [place];  Service: Open Heart Surgery;  Laterality: N/A;  Percutaneous    Home Medications:  Allergies as of 05/10/2022       Reactions   Cat Hair Extract Other (See Comments)   Red eyes and Sneezing Also allergic to Dogs        Medication List        Accurate as of May 10, 2022  3:40 PM. If you have any questions, ask your nurse or doctor.          alendronate 70 MG tablet Commonly known as: FOSAMAX Take 70 mg by mouth every Wednesday.   apixaban 5 MG Tabs tablet Commonly known as: ELIQUIS Take  1 tablet (5 mg total) by mouth 2 (two) times daily.   HYDROcodone-acetaminophen 5-325 MG tablet Commonly known as: Norco Take 1 tablet by mouth every 6 (six) hours as needed for moderate pain.   levETIRAcetam 500 MG tablet Commonly known as: KEPPRA Take 1 tablet (500 mg total) by mouth 2 (two) times daily.   lisinopril 10 MG tablet Commonly known as: ZESTRIL Take 10 mg by mouth at bedtime.    metFORMIN 500 MG tablet Commonly known as: GLUCOPHAGE Take 500 mg by mouth 2 (two) times daily with a meal.   metoprolol succinate 25 MG 24 hr tablet Commonly known as: TOPROL-XL Take 25 mg by mouth at bedtime.   mirabegron ER 25 MG Tb24 tablet Commonly known as: MYRBETRIQ Take 1 tablet (25 mg total) by mouth daily.   ondansetron 4 MG tablet Commonly known as: Zofran Take 1 tablet (4 mg total) by mouth daily as needed for nausea or vomiting.   rosuvastatin 20 MG tablet Commonly known as: CRESTOR Take 1 tablet (20 mg total) by mouth daily. What changed: when to take this   tamsulosin 0.4 MG Caps capsule Commonly known as: Flomax Take 1 capsule (0.4 mg total) by mouth daily after supper.   Vitamin D3 125 MCG (5000 UT) capsule Generic drug: Cholecalciferol Take 5,000 Units by mouth daily.        Allergies:  Allergies  Allergen Reactions   Cat Hair Extract Other (See Comments)    Red eyes and Sneezing Also allergic to Dogs    Family History: Family History  Problem Relation Age of Onset   Cancer Father    Arrhythmia Sister        Atrial fibrillation    Social History:  reports that she has never smoked. She has never been exposed to tobacco smoke. She has never used smokeless tobacco. She reports that she does not drink alcohol and does not use drugs.  ROS: All other review of systems were reviewed and are negative except what is noted above in HPI  Physical Exam: BP 133/69   Pulse 93   Constitutional:  Alert and oriented, No acute distress. HEENT: [place] AT, moist mucus membranes.  Trachea midline, no masses. Cardiovascular: No clubbing, cyanosis, or edema. Respiratory: Normal respiratory effort, no increased work of breathing. GI: Abdomen is soft, nontender, nondistended, no abdominal masses GU: No CVA tenderness.  Lymph: No cervical or inguinal lymphadenopathy. Skin: No rashes, bruises or suspicious lesions. Neurologic: Grossly intact, no focal deficits,  moving all 4 extremities. Psychiatric: Normal mood and affect.  Laboratory Data: Lab Results  Component Value Date   WBC 9.0 02/12/2022   HGB 12.1 02/12/2022   HCT 37.6 02/12/2022   MCV 90.8 02/12/2022   PLT 146 (L) 02/12/2022    Lab Results  Component Value Date   CREATININE 0.84 04/25/2022    No results found for: "PSA"  No results found for: "TESTOSTERONE"  Lab Results  Component Value Date   HGBA1C 6.4 (H) 04/25/2022    Urinalysis    Component Value Date/Time   COLORURINE YELLOW 02/10/2022 1950   APPEARANCEUR Cloudy (A) 03/07/2022 1730   LABSPEC 1.042 (H) 02/10/2022 1950   PHURINE 7.0 02/10/2022 1950   GLUCOSEU Negative 03/07/2022 1730   HGBUR MODERATE (A) 02/10/2022 1950   BILIRUBINUR Negative 03/07/2022 1730   KETONESUR NEGATIVE 02/10/2022 1950   PROTEINUR 3+ (A) 03/07/2022 1730   PROTEINUR 30 (A) 02/10/2022 1950   UROBILINOGEN 0.2 08/19/2021 1604   NITRITE Negative 03/07/2022 1730  NITRITE NEGATIVE 02/10/2022 1950   LEUKOCYTESUR Trace (A) 03/07/2022 1730   LEUKOCYTESUR NEGATIVE 02/10/2022 1950    Lab Results  Component Value Date   LABMICR See below: 03/07/2022   WBCUA 6-10 (A) 03/07/2022   LABEPIT 0-10 03/07/2022   MUCUS Present (A) 01/24/2022   BACTERIA Moderate (A) 03/07/2022    Pertinent Imaging: *** No results found for this or any previous visit.  No results found for this or any previous visit.  No results found for this or any previous visit.  No results found for this or any previous visit.  No results found for this or any previous visit.  No valid procedures specified. No results found for this or any previous visit.  Results for orders placed in visit on 01/24/22  CT RENAL STONE STUDY  Narrative CLINICAL DATA:  Urolithiasis.  EXAM: CT ABDOMEN AND PELVIS WITHOUT CONTRAST  TECHNIQUE: Multidetector CT imaging of the abdomen and pelvis was performed following the standard protocol without IV contrast.  RADIATION DOSE  REDUCTION: This exam was performed according to the departmental dose-optimization program which includes automated exposure control, adjustment of the mA and/or kV according to patient size and/or use of iterative reconstruction technique.  COMPARISON:  CT 02/15/2021 and older  FINDINGS: Lower chest: There is linear opacity lung bases likely scar or atelectasis. No pleural effusion. Status post TAVR. Question some enlargement of the right atrium. Small hiatal hernia.  Hepatobiliary: On this non IV contrast exam liver has preserved contour. Gallbladder is nondilated.  Pancreas: Moderate pancreatic atrophy with some fatty replacement.  Spleen: Spleen is not enlarged.  Adrenals/Urinary Tract: Normal adrenal glands. No abnormal calcification is seen within the right kidney nor along the course of the right ureter. No left ureteral stone. There are least 4 stones in the left renal pelvis with the largest measuring a proximally 9 mm in cephalocaudal length. Slight ectasia of the renal pelvis but no caliectasis. Mild adjacent fat stranding in this location as well. Preserved contour to the urinary bladder.  Stomach/Bowel: On this non oral contrast exam, large bowel has a normal course and caliber with diffuse colonic stool. Normal appendix. Moderate debris in the nondilated stomach. Small bowel is nondilated on this non oral contrast exam. No free air or free fluid.  Vascular/Lymphatic: Normal caliber abdominal aorta and IVC. Scattered atherosclerotic calcified plaque along the aorta and branch vessels. No specific abnormal lymph node enlargement present in the abdomen or pelvis.  Reproductive: Uterus and bilateral adnexa are unremarkable.  Other: No abdominal wall hernia or abnormality. No abdominopelvic ascites.  Musculoskeletal: Curvature and degenerative changes seen of the spine. Scattered degenerative changes of the pelvis as well. There is some posterior osteophytes at  the T12-L1 level with some stenosis along the central canal.  IMPRESSION: Multiple nonobstructing left-sided renal pelvis stones. No collecting system dilatation. Mild adjacent fat stranding. No ureteral stones.   Electronically Signed By: Jill Side M.D. On: 02/23/2022 15:56   Assessment & Plan:    1. Nephrolithiasis Followup 6 weeks with a renal US - Urinalysis, Routine w reflex microscopic   No follow-ups on file.  Nicolette Bang, MD  [facility] [place]

## 2022-05-11 ENCOUNTER — Encounter: Payer: Self-pay | Admitting: Urology

## 2022-05-11 LAB — MICROSCOPIC EXAMINATION: RBC, Urine: >30 /hpf — AB (ref 0–2)

## 2022-05-11 LAB — URINALYSIS, ROUTINE W REFLEX MICROSCOPIC
Bilirubin, UA: NEGATIVE
Glucose, UA: NEGATIVE
Nitrite, UA: NEGATIVE
Specific Gravity, UA: 1.03 (ref 1.005–1.030)
Urobilinogen, Ur: 1 mg/dL (ref 0.2–1.0)
pH, UA: 5 (ref 5.0–7.5)

## 2022-05-11 NOTE — Patient Instructions (Signed)

## 2022-05-22 ENCOUNTER — Encounter: Payer: Self-pay | Admitting: Cardiology

## 2022-05-22 ENCOUNTER — Ambulatory Visit: Payer: No Typology Code available for payment source | Attending: Cardiology | Admitting: Cardiology

## 2022-05-22 VITALS — BP 122/72 | HR 87 | Ht 62.0 in | Wt 161.4 lb

## 2022-05-22 DIAGNOSIS — Z952 Presence of prosthetic heart valve: Secondary | ICD-10-CM

## 2022-05-22 DIAGNOSIS — E782 Mixed hyperlipidemia: Secondary | ICD-10-CM

## 2022-05-22 DIAGNOSIS — I4819 Other persistent atrial fibrillation: Secondary | ICD-10-CM | POA: Diagnosis not present

## 2022-05-22 NOTE — Patient Instructions (Signed)
Medication Instructions:  Continue all current medications.   Labwork: none  Testing/Procedures: none  Follow-Up: 6 months   Any Other Special Instructions Will Be Listed Below (If Applicable).   If you need a refill on your cardiac medications before your next appointment, please call your pharmacy.  

## 2022-05-22 NOTE — Progress Notes (Signed)
Clinical Summary Anne Wolf is a 89 y.o.female seen today for follow up of the following medical problems.    1. Afib - no palpitations - compliant with meds - no bleeding on eliquis.    2. Hyperlipidemia  01/2022 TC 183 TG 64 HDL 58 LDL 112 - had been on simvastatin, changed to crestor 01/2022   3. Aortic stenosis -  underwent successful TAVR with a 23 mm Edwards Sapien 3UR THV via the TF approach on 05/31/21.  06/2021 echo: LVEF 60-65%, normal AVR 01/2022 LVEF 60-65%, normal AVR. Mild to mod AS - no SOB/DOE, no chest pains.    4. 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 in setting of subtherapeutic INR at 1.3 -  had to be off coumadin aroudn that time for invasvive procedures/testing  -admit 01/2023 seziures, TIA like symptoms - INR subtherapeutic, she agreed to change to eliquis from coumadin. Previously had been resistant.   5. Seizures - followed by neuro Past Medical History:  Diagnosis Date   Aortic stenosis    mild; mild MR; slightly increased pulmonary artery pressure with mild RVH; normal LV-2010   Arthritis    Atrial fibrillation (HCC)    chronic anticoagulation; adequate HR control on minimal AV nodal blocking medication    Cancer ([place])    skin cancers removed from face   CHF (congestive heart failure) ([place])    Diabetes mellitus without complication ([place])    2   Dyspnea    Dysrhythmia    Fasting hyperglycemia    + microalbuminuria; A1c of 6.5% in 11/2010   Hematochezia 01/2009   01/2009-presumed ischemic colitis; history of diverticulosis   History of kidney stones    Hx of adenomatous colonic polyps 2005   adenomatous polyp; negative colonoscopy in 2009   Hyperlipidemia    Seizure ([place]) 01/2022   Stroke (HCC)      Allergies  Allergen Reactions   Cat Hair Extract Other (See Comments)    Red eyes and Sneezing Also allergic to Dogs     Current Outpatient Medications   Medication Sig Dispense Refill   alendronate (FOSAMAX) 70 MG tablet Take 70 mg by mouth every Wednesday.     apixaban (ELIQUIS) 5 MG TABS tablet Take 1 tablet (5 mg total) by mouth 2 (two) times daily. 60 tablet 11   Cholecalciferol (VITAMIN D3) 125 MCG (5000 UT) capsule Take 5,000 Units by mouth daily.     HYDROcodone-acetaminophen (NORCO) 5-325 MG tablet Take 1 tablet by mouth every 6 (six) hours as needed for moderate pain. 30 tablet 0   levETIRAcetam (KEPPRA) 500 MG tablet Take 1 tablet (500 mg total) by mouth 2 (two) times daily. 180 tablet 3   lisinopril (PRINIVIL,ZESTRIL) 10 MG tablet Take 10 mg by mouth at bedtime.     metFORMIN (GLUCOPHAGE) 500 MG tablet Take 500 mg by mouth 2 (two) times daily with a meal.      metoprolol succinate (TOPROL-XL) 25 MG 24 hr tablet Take 25 mg by mouth at bedtime.     mirabegron ER (MYRBETRIQ) 25 MG TB24 tablet Take 1 tablet (25 mg total) by mouth daily. (Patient not taking: Reported on 04/24/2022) 30 tablet 0   ondansetron (ZOFRAN) 4 MG tablet Take 1 tablet (4 mg total) by mouth daily as needed for nausea or vomiting. 30 tablet 1   rosuvastatin (CRESTOR) 20 MG tablet Take 1 tablet (20 mg total) by mouth  daily. (Patient taking differently: Take 20 mg by mouth every evening.) 30 tablet 1   tamsulosin (FLOMAX) 0.4 MG CAPS capsule Take 1 capsule (0.4 mg total) by mouth daily after supper. 30 capsule 0   No current facility-administered medications for this visit.     Past Surgical History:  Procedure Laterality Date   CATARACT EXTRACTION W/PHACO Right 03/23/2014   Procedure: CATARACT EXTRACTION PHACO AND INTRAOCULAR LENS PLACEMENT (IOC);  Surgeon: Williams Che, MD;  Location: [facility];  Service: Ophthalmology;  Laterality: Right;  CDE:2.83   CATARACT EXTRACTION W/PHACO Left 07/27/2014   Procedure: CATARACT EXTRACTION PHACO AND INTRAOCULAR LENS PLACEMENT LEFT EYE CDE=6.97;  Surgeon: Williams Che, MD;  Location: [facility];  Service: Ophthalmology;  Laterality:  Left;   COLONOSCOPY  2009   negative; prior study with adenomatous polyp   CYSTOSCOPY WITH RETROGRADE PYELOGRAM, URETEROSCOPY AND STENT PLACEMENT Left 04/27/2022   Procedure: CYSTOSCOPY WITH RETROGRADE PYELOGRAM, URETEROSCOPY AND STENT PLACEMENT;  Surgeon: Cleon Gustin, MD;  Location: [facility];  Service: Urology;  Laterality: Left;   CYSTOSCOPY/URETEROSCOPY/HOLMIUM LASER/STENT PLACEMENT Right 08/21/2016   Procedure: CYSTOSCOPY/URETEROSCOPY/RETROGRADE PYELOGRAM//STENT PLACEMENT;  Surgeon: Nickie Retort, MD;  Location: [facility];  Service: Urology;  Laterality: Right;   EYE SURGERY     HOLMIUM LASER APPLICATION Left [id number]   Procedure: HOLMIUM LASER APPLICATION;  Surgeon: Cleon Gustin, MD;  Location: [facility];  Service: Urology;  Laterality: Left;   INTRAOPERATIVE TRANSTHORACIC ECHOCARDIOGRAM N/A 05/31/2021   Procedure: INTRAOPERATIVE TRANSTHORACIC ECHOCARDIOGRAM;  Surgeon: Sherren Mocha, MD;  Location: [facility];  Service: Open Heart Surgery;  Laterality: N/A;   RIGHT/LEFT HEART CATH AND CORONARY ANGIOGRAPHY N/A 02/04/2021   Procedure: RIGHT/LEFT HEART CATH AND CORONARY ANGIOGRAPHY;  Surgeon: Sherren Mocha, MD;  Location: [facility];  Service: Cardiovascular;  Laterality: N/A;   TRANSCATHETER AORTIC VALVE REPLACEMENT, TRANSFEMORAL N/A 05/31/2021   Procedure: Transcatheter Aortic Valve Replacement 73mm, Transfemoral;  Surgeon: Sherren Mocha, MD;  Location: [facility];  Service: Open Heart Surgery;  Laterality: N/A;  Percutaneous     Allergies  Allergen Reactions   Cat Hair Extract Other (See Comments)    Red eyes and Sneezing Also allergic to Dogs      Family History  Problem Relation Age of Onset   Cancer Father    Arrhythmia Sister        Atrial fibrillation     Social History Ms. Vinje reports that she has never smoked. She has never been exposed to tobacco smoke. She has never used smokeless tobacco. Ms. Becklund reports no history of alcohol use.   Review of  Systems CONSTITUTIONAL: No weight loss, fever, chills, weakness or fatigue.  HEENT: Eyes: No visual loss, blurred vision, double vision or yellow sclerae.No hearing loss, sneezing, congestion, runny nose or sore throat.  SKIN: No rash or itching.  CARDIOVASCULAR: per hpi RESPIRATORY: No shortness of breath, cough or sputum.  GASTROINTESTINAL: No anorexia, nausea, vomiting or diarrhea. No abdominal pain or blood.  GENITOURINARY: No burning on urination, no polyuria NEUROLOGICAL: No headache, dizziness, syncope, paralysis, ataxia, numbness or tingling in the extremities. No change in bowel or bladder control.  MUSCULOSKELETAL: No muscle, back pain, joint pain or stiffness.  LYMPHATICS: No enlarged nodes. No history of splenectomy.  PSYCHIATRIC: No history of depression or anxiety.  ENDOCRINOLOGIC: No reports of sweating, cold or heat intolerance. No polyuria or polydipsia.  Marland Kitchen   Physical Examination Today's Vitals   05/22/22 0840  BP: 122/72  Pulse: 87  SpO2: 97%  Weight: 161 lb 6.4 oz (73.2 kg)  Height: 5\' 2"  (1.575 m)   Body mass index is 29.52 kg/m.  Gen: resting comfortably, no acute distress HEENT: no scleral icterus, pupils equal round and reactive, no palptable cervical adenopathy,  CV: irreg, no m/rg, no jvd Resp: Clear to auscultation bilaterally GI: abdomen is soft, non-tender, non-distended, normal bowel sounds, no hepatosplenomegaly MSK: extremities are warm, no edema.  Skin: warm, no rash Neuro:  no focal deficits Psych: appropriate affect   Diagnostic Studies  10/2013 Echo Study Conclusions  - Left ventricle: The cavity size was normal. Wall thickness was   increased in a pattern of moderate LVH. Systolic function was   normal. The estimated ejection fraction was in the range of 55%   to 60%. Wall motion was normal; there were no regional wall   motion abnormalities. Doppler parameters are consistent with   elevated ventricular end-diastolic filling  pressure. - Aortic valve: Mildly calcified annulus. Trileaflet; mildly to   moderately calcified leaflets. Cusp separation was mildly   reduced. There was mild to moderate stenosis. There was mild   regurgitation. Mean gradient (S): 11 mm Hg. Peak gradient (S): 23   mm Hg. VTI ratio of LVOT to aortic valve: 0.4. Valve area (VTI):   1.24 cm^2. Valve area (Vmax): 1.32 cm^2. Planimetry valve area   1.8 square cm. - Mitral valve: Calcified annulus. There was mild regurgitation.   Valve area by pressure half-time: 2.2 cm^2. - Left atrium: The atrium was severely dilated. - Right atrium: The atrium was severely dilated. Central venous   pressure (est): 3 mm Hg. - Atrial septum: No defect or patent foramen ovale was identified. - Tricuspid valve: There was mild regurgitation. - Pulmonary arteries: PA peak pressure: 35 mm Hg (S). - Pericardium, extracardiac: There was no pericardial effusion.  Impressions:  - Moderate LVH with LVEF 55-60%, indeterminate diastolic function   with evidence of increased LVEDP. Severe biatrial enlargement.   Moderate MAC with mild mitral regurgitation. Mild to moderate,   calcific aortic stenosis as outlined above with mild aortic   regurgitation. Mild tricuspid regurgitation with PASP 35 mmHg.   08/2016 echo Study Conclusions   - Left ventricle: The cavity size was normal. Wall thickness was   increased in a pattern of mild LVH. Systolic function was normal.   The estimated ejection fraction was in the range of 55% to 60%. - Aortic valve: AV is thickened, calcified with mildly restricted   motion Peak and mean gradients through the valve are 30 and 17 mm   Hg respectively. There was mild regurgitation. - Mitral valve: Calcified annulus. Mildly thickened leaflets .   There was mild regurgitation. - Left atrium: The atrium was mildly dilated. - Right atrium: The atrium was mildly dilated. - Pulmonary arteries: PA peak pressure: 49 mm Hg (S).     12/2020  echo   IMPRESSIONS     1. Left ventricular ejection fraction, by estimation, is 60 to 65%. The  left ventricle has normal function. The left ventricle has no regional  wall motion abnormalities. Left ventricular diastolic parameters are  indeterminate. Basal septal hypertrophy  noted.   2. Right ventricular systolic function is normal. The right ventricular  size is normal. There is mildly elevated pulmonary artery systolic  pressure.   3. Left atrial size was severely dilated.   4. Right atrial size was severely dilated.   5. Mitral Valve area by continuity equation 1.79 cm; this is in the  setting of aortic insuffiency. The mitral valve is degenerative. Mild  mitral valve regurgitation. Mild mitral stenosis. The mean mitral valve  gradient is 10.0 mmHg with average heart  rate of 96 bpm.   6. The aortic valve is calcified. Aortic valve regurgitation is mild to  moderate. Moderate to severe aortic valve stenosis. Aortic valve area, by  VTI measures 0.95 cm. Aortic valve mean gradient measures 29.0 mmHg.  Aortic valve Vmax measures 3.46 m/s.  Normal LV stroke volume with with DVI 0.3   7. The inferior vena cava is dilated in size with >50% respiratory  variability, suggesting right atrial pressure of 8 mmHg.      12/2020 nuclear stress The study is normal. The study is low risk.   No ST deviation was noted. The ECG was negative for ischemia.   LV perfusion is normal.   Left ventricular function is normal. Nuclear stress EF: 71 %.   Low risk study with no ischemia and normal LVEF at 71%.     01/2021 RHC/LHC   There is moderate aortic valve stenosis.   1.  Patent coronary arteries with no significant stenoses.  Mild calcification noted. 2.  Moderate to severe aortic stenosis with mean transvalvular gradient 28 mmHg and calculated aortic valve area 1.1 cm 3.  Normal right heart hemodynamics   Recommend: CT angiography studies, valve team review, consideration of further  treatment options might include commercial TAVR versus enrollment in the Progress moderate aortic stenosis TAVR trial.   01/2021 echo IMPRESSIONS     1. See full echo done 12/27/20 regarding valvular heart disease.   2. Left ventricular ejection fraction, by estimation, is 60 to 65%. The  left ventricle has normal function. The left ventricle has no regional  wall motion abnormalities. There is moderate left ventricular hypertrophy.   3. Right ventricular systolic function is normal. The right ventricular  size is normal.   4. Left atrial size was moderately dilated.   5. No color or bubble study done.   6. Right atrial size was moderately dilated.   7. Limited echo known MS/MR no doppler or color flow done . The mitral  valve is degenerative. No evidence of mitral valve regurgitation. No  evidence of mitral stenosis. Moderate mitral annular calcification.   8. Limited echo no doppler / color done known AS/AR . The aortic valve is  tricuspid. There is moderate calcification of the aortic valve. There is  moderate thickening of the aortic valve. Aortic valve regurgitation is not  visualized. No aortic stenosis  is present.   9. The inferior vena cava is dilated in size with >50% respiratory  variability, suggesting right atrial pressure of 8 mmHg.        2D Echo 10/14/2020: 1. Small LV cavity (normal by index) and systolic function. Estimated LVEF 61% by MOD. Normal RV size and systolic function.  2. Moderate-severe AS with averaged mean PG 17mmHg (AVA 0.72cm^2) and mild-moderate AR. The SVI is 30 ml/m2, AVAI is 0.4 cm2/m2, DVI 0.21 all which suggest the posibility of low-gradient severe AS.  3. Mild MS with averaged mean PG 75mmHg (HR 85bpm) and mild MR. Mild-moderate TR. Estimated RVSP 40mmHg; RAP 36mmHg. Trace PR.  4. No prior studies for comparison.    06/2021 echo 1. Left ventricular ejection fraction, by estimation, is 60 to 65%. The  left ventricle has normal function. The left  ventricle has no regional  wall motion abnormalities. There is moderate left ventricular hypertrophy  of the  basal-septal segment. Left  ventricular diastolic function could not be evaluated.   2. Right ventricular systolic function is normal. The right ventricular  size is normal. There is mildly elevated pulmonary artery systolic  pressure.   3. Left atrial size was severely dilated.   4. Right atrial size was severely dilated.   5. The mitral valve is degenerative. Mild mitral valve regurgitation.  Mild to moderate mitral stenosis. The mean mitral valve gradient is 4.0  mmHg. Moderate to severe mitral annular calcification.   6. Tricuspid valve regurgitation is mild to moderate.   7. The aortic valve is normal in structure. Aortic valve regurgitation is  trivial. No aortic stenosis is present. There is a 23 mm Sapien prosthetic  (TAVR) valve present in the aortic position. Procedure Date: 05/31/21. Echo  findings are consistent with  normal structure and function of the aortic valve prosthesis. Aortic valve  area, by VTI measures 2.17 cm. Aortic valve mean gradient measures 7.2  mmHg. Aortic valve Vmax measures 1.80 m/s.   8. The inferior vena cava is normal in size with greater than 50%  respiratory variability, suggesting right atrial pressure of 3 mmHg.   Comparison(s): 06/01/21 EF 60-65%. Moderate MS 41mmHg mean PG, 54mmHg peak  PG. AV 89mmHg mean PG, 17mmHg PG. PA pressure 25mmHg.    Assessment and Plan   1. Afib/acquired thrombophilia -no symptoms, continue current meds   2. Aortic stenosis -s/p TAVR - recent US with normla functioning valve - continue to monitor  3. Hyperlipidemia - recently changed to crestor, continue to monitor lipids  F/u 6 months     Arnoldo Lenis, M.D.

## 2022-06-05 ENCOUNTER — Other Ambulatory Visit: Payer: Self-pay

## 2022-06-05 ENCOUNTER — Encounter (HOSPITAL_COMMUNITY): Payer: Self-pay | Admitting: *Deleted

## 2022-06-05 ENCOUNTER — Emergency Department (HOSPITAL_COMMUNITY)
Admission: EM | Admit: 2022-06-05 | Discharge: 2022-06-05 | Disposition: A | Discharge status: Home / Self Care | Payer: No Typology Code available for payment source | Attending: Emergency Medicine | Admitting: Emergency Medicine

## 2022-06-05 DIAGNOSIS — Z7901 Long term (current) use of anticoagulants: Secondary | ICD-10-CM | POA: Insufficient documentation

## 2022-06-05 DIAGNOSIS — N3001 Acute cystitis with hematuria: Secondary | ICD-10-CM | POA: Diagnosis not present

## 2022-06-05 DIAGNOSIS — I10 Essential (primary) hypertension: Secondary | ICD-10-CM | POA: Diagnosis not present

## 2022-06-05 DIAGNOSIS — E119 Type 2 diabetes mellitus without complications: Secondary | ICD-10-CM | POA: Insufficient documentation

## 2022-06-05 DIAGNOSIS — Z8673 Personal history of transient ischemic attack (TIA), and cerebral infarction without residual deficits: Secondary | ICD-10-CM | POA: Diagnosis not present

## 2022-06-05 DIAGNOSIS — Z8669 Personal history of other diseases of the nervous system and sense organs: Secondary | ICD-10-CM | POA: Diagnosis not present

## 2022-06-05 DIAGNOSIS — Z79899 Other long term (current) drug therapy: Secondary | ICD-10-CM | POA: Diagnosis not present

## 2022-06-05 DIAGNOSIS — Z7984 Long term (current) use of oral hypoglycemic drugs: Secondary | ICD-10-CM | POA: Insufficient documentation

## 2022-06-05 DIAGNOSIS — R569 Unspecified convulsions: Secondary | ICD-10-CM | POA: Diagnosis not present

## 2022-06-05 DIAGNOSIS — Z87898 Personal history of other specified conditions: Secondary | ICD-10-CM

## 2022-06-05 DIAGNOSIS — R000 Tachycardia, unspecified: Secondary | ICD-10-CM | POA: Diagnosis not present

## 2022-06-05 DIAGNOSIS — R55 Syncope and collapse: Secondary | ICD-10-CM | POA: Diagnosis present

## 2022-06-05 DIAGNOSIS — G40909 Epilepsy, unspecified, not intractable, without status epilepticus: Secondary | ICD-10-CM | POA: Diagnosis not present

## 2022-06-05 LAB — URINALYSIS, ROUTINE W REFLEX MICROSCOPIC
Bilirubin Urine: NEGATIVE
Glucose, UA: NEGATIVE mg/dL
Ketones, ur: NEGATIVE mg/dL
Nitrite: NEGATIVE
Protein, ur: 30 mg/dL — AB
Specific Gravity, Urine: 1.012 (ref 1.005–1.030)
pH: 6 (ref 5.0–8.0)

## 2022-06-05 LAB — CBC WITH DIFFERENTIAL/PLATELET
Abs Immature Granulocytes: 0.05 10*3/uL (ref 0.00–0.07)
Basophils Absolute: 0 10*3/uL (ref 0.0–0.1)
Basophils Relative: 1 %
Eosinophils Absolute: 0.1 10*3/uL (ref 0.0–0.5)
Eosinophils Relative: 1 %
HCT: 39.9 % (ref 36.0–46.0)
Hemoglobin: 12.8 g/dL (ref 12.0–15.0)
Immature Granulocytes: 1 %
Lymphocytes Relative: 14 %
Lymphs Abs: 1.1 10*3/uL (ref 0.7–4.0)
MCH: 29.2 pg (ref 26.0–34.0)
MCHC: 32.1 g/dL (ref 30.0–36.0)
MCV: 91.1 fL (ref 80.0–100.0)
Monocytes Absolute: 0.5 10*3/uL (ref 0.1–1.0)
Monocytes Relative: 6 %
Neutro Abs: 6.6 10*3/uL (ref 1.7–7.7)
Neutrophils Relative %: 77 %
Platelets: 161 10*3/uL (ref 150–400)
RBC: 4.38 MIL/uL (ref 3.87–5.11)
RDW: 13.7 % (ref 11.5–15.5)
WBC: 8.5 10*3/uL (ref 4.0–10.5)
nRBC: 0 % (ref 0.0–0.2)

## 2022-06-05 LAB — COMPREHENSIVE METABOLIC PANEL
ALT: 18 U/L (ref 0–44)
AST: 28 U/L (ref 15–41)
Albumin: 3.7 g/dL (ref 3.5–5.0)
Alkaline Phosphatase: 41 U/L (ref 38–126)
Anion gap: 5 (ref 5–15)
BUN: 11 mg/dL (ref 8–23)
CO2: 26 mmol/L (ref 22–32)
Calcium: 8.8 mg/dL — ABNORMAL LOW (ref 8.9–10.3)
Chloride: 105 mmol/L (ref 98–111)
Creatinine, Ser: 0.86 mg/dL (ref 0.44–1.00)
GFR, Estimated: >60 mL/min (ref >60)
Glucose, Bld: 146 mg/dL — ABNORMAL HIGH (ref 70–99)
Potassium: 3.6 mmol/L (ref 3.5–5.1)
Sodium: 136 mmol/L (ref 135–145)
Total Bilirubin: 0.5 mg/dL (ref 0.3–1.2)
Total Protein: 7.2 g/dL (ref 6.5–8.1)

## 2022-06-05 LAB — CBG MONITORING, ED: Glucose-Capillary: 143 mg/dL — ABNORMAL HIGH (ref 70–99)

## 2022-06-05 LAB — MAGNESIUM: Magnesium: 1.9 mg/dL (ref 1.7–2.4)

## 2022-06-05 MED ORDER — CEPHALEXIN 500 MG PO CAPS: 500.0000 mg | ORAL_CAPSULE | Freq: Four times a day (QID) | ORAL | 0 refills | Status: DC

## 2022-06-05 MED ORDER — CEPHALEXIN 500 MG PO CAPS
500.0000 mg | ORAL_CAPSULE | Freq: Once | ORAL | Status: AC
  Administered 2022-06-05: 500 mg via ORAL
  Filled 2022-06-05: qty 1

## 2022-06-05 NOTE — ED Provider Notes (Signed)
[place] EMERGENCY DEPARTMENT AT [facility] Provider Note   CSN: [id number] Arrival date & time: 06/05/22  7829     History {Add pertinent medical, surgical, social history, OB history to HPI:1} Chief Complaint  Patient presents with  . Loss of Consciousness    Anne Wolf is a 89 y.o. female.   Loss of Consciousness Associated symptoms: seizures   Associated symptoms: no chest pain, no confusion, no dizziness, no fever, no headaches, no nausea, no shortness of breath, no vomiting and no weakness        Anne Wolf is a 89 y.o. female with past medical history of atrial fibrillation (anticoagulated on Eliquis), prior TIA, hypertension, type 2 diabetes, aortic stenosis, and prior stroke who presents to the Emergency Department from home via EMS.  Patient was found slumped over on the toilet this morning by her husband.  States she was unresponsive at the time.  Episode occurred approximately 1 and half hours ago.  He states that she was moving one of her arms as if to grab onto something and he noted that she was drooling.  He states similar episode last December in which she had a seizure.  She takes Keppra daily.  Patient does not recall events, states she woke this morning "not feeling like myself."  Per patient's husband who is at bedside, patient appears to be at her baseline at this time. He denies a fall.  Per EMS patient was found on the floor, but spouse stated that he assisted her off the toilet onto the floor to prevent a fall.     Home Medications Prior to Admission medications   Medication Sig Start Date End Date Taking? Authorizing Provider  alendronate (FOSAMAX) 70 MG tablet Take 70 mg by mouth every Wednesday.    [provider]  apixaban (ELIQUIS) 5 MG TABS tablet Take 1 tablet (5 mg total) by mouth 2 (two) times daily. 04/10/22   Windell Norfolk, MD  Cholecalciferol (VITAMIN D3) 125 MCG (5000 UT) capsule Take 5,000 Units by mouth daily.     [provider]  levETIRAcetam (KEPPRA) 500 MG tablet Take 1 tablet (500 mg total) by mouth 2 (two) times daily. 04/10/22 04/05/23  Windell Norfolk, MD  lisinopril (PRINIVIL,ZESTRIL) 10 MG tablet Take 10 mg by mouth at bedtime.    [provider]  metFORMIN (GLUCOPHAGE) 500 MG tablet Take 500 mg by mouth 2 (two) times daily with a meal.     [provider]  metoprolol succinate (TOPROL-XL) 25 MG 24 hr tablet Take 25 mg by mouth at bedtime.    [provider]  mirabegron ER (MYRBETRIQ) 25 MG TB24 tablet Take 1 tablet (25 mg total) by mouth daily. Patient not taking: Reported on 04/24/2022 01/24/22   Malen Gauze, MD  ondansetron (ZOFRAN) 4 MG tablet Take 1 tablet (4 mg total) by mouth daily as needed for nausea or vomiting. Patient not taking: Reported on 05/22/2022 04/27/22 04/27/23  Malen Gauze, MD  rosuvastatin (CRESTOR) 20 MG tablet Take 1 tablet (20 mg total) by mouth daily. Patient taking differently: Take 20 mg by mouth every evening. 02/13/22   de Saintclair Halsted, Cortney E, NP  tamsulosin (FLOMAX) 0.4 MG CAPS capsule Take 1 capsule (0.4 mg total) by mouth daily after supper. Patient not taking: Reported on 05/22/2022 04/27/22   Malen Gauze, MD      Allergies    Cat hair extract    Review of Systems  Review of Systems  Constitutional:  Negative for chills and fever.  Eyes:  Negative for visual disturbance.  Respiratory:  Negative for shortness of breath.   Cardiovascular:  Positive for syncope. Negative for chest pain.  Gastrointestinal:  Negative for nausea and vomiting.  Genitourinary:  Negative for difficulty urinating and dysuria.  Musculoskeletal:  Negative for arthralgias and myalgias.  Neurological:  Positive for seizures. Negative for dizziness, speech difficulty, weakness, numbness and headaches.  Psychiatric/Behavioral:  Negative for confusion.     Physical Exam Updated Vital Signs BP 128/68   Pulse 85   Resp 11   Ht 5\' 2"   (1.575 m)   Wt 73 kg   SpO2 95%   BMI 29.44 kg/m  Physical Exam Vitals and nursing note reviewed.  Constitutional:      General: She is not in acute distress.    Appearance: Normal appearance. She is not ill-appearing or toxic-appearing.  HENT:     Head: Atraumatic.     Mouth/Throat:     Mouth: Mucous membranes are moist.  Eyes:     Extraocular Movements: Extraocular movements intact.     Conjunctiva/sclera: Conjunctivae normal.     Pupils: Pupils are equal, round, and reactive to light.  Cardiovascular:     Rate and Rhythm: Normal rate and regular rhythm.     Pulses: Normal pulses.  Pulmonary:     Effort: Pulmonary effort is normal.  Abdominal:     Palpations: Abdomen is soft.     Tenderness: There is no abdominal tenderness.  Musculoskeletal:        General: Normal range of motion.     Cervical back: Normal range of motion.     Right lower leg: No edema.     Left lower leg: No edema.  Skin:    General: Skin is warm.     Capillary Refill: Capillary refill takes less than 2 seconds.     Findings: No rash.  Neurological:     General: No focal deficit present.     Mental Status: She is alert.     GCS: GCS eye subscore is 4. GCS verbal subscore is 5. GCS motor subscore is 6.     Sensory: Sensation is intact. No sensory deficit.     Motor: Motor function is intact. No weakness.     Coordination: Coordination is intact.     Comments: CN II-XII intact.  Speech clear.  No pronator drift, no facial droop.  Oriented x 3      ED Results / Procedures / Treatments   Labs (all labs ordered are listed, but only abnormal results are displayed) Labs Reviewed  COMPREHENSIVE METABOLIC PANEL - Abnormal; Notable for the following components:      Result Value   Glucose, Bld 146 (*)    Calcium 8.8 (*)    All other components within normal limits  URINALYSIS, ROUTINE W REFLEX MICROSCOPIC - Abnormal; Notable for the following components:   Hgb urine dipstick MODERATE (*)    Protein,  ur 30 (*)    Leukocytes,Ua SMALL (*)    Bacteria, UA RARE (*)    All other components within normal limits  CBG MONITORING, ED - Abnormal; Notable for the following components:   Glucose-Capillary 143 (*)    All other components within normal limits  URINE CULTURE  CBC WITH DIFFERENTIAL/PLATELET  MAGNESIUM    EKG EKG Interpretation  Date/Time:  Monday June 05 2022 09:07:29 EDT Ventricular Rate:  96 PR Interval:  QRS Duration: 106 QT Interval:  379 QTC Calculation: 479 R Axis:   -71 Text Interpretation: Atrial fibrillation Left anterior fascicular block Abnormal R-wave progression, early transition Confirmed by Gerhard Munch [phone]) on 06/05/2022 9:16:46 AM  Radiology No results found.  Procedures Procedures  {Document cardiac monitor, telemetry assessment procedure when appropriate:1}  Medications Ordered in ED Medications - No data to display  ED Course/ Medical Decision Making/ A&P   {   Click here for ABCD2, HEART and other calculatorsREFRESH Note before signing :1}                          Medical Decision Making Amount and/or Complexity of Data Reviewed Labs: ordered.  Risk Prescription drug management.     {Document critical care time when appropriate:1} {Document review of labs and clinical decision tools ie heart score, Chads2Vasc2 etc:1}  {Document your independent review of radiology images, and any outside records:1} {Document your discussion with family members, caretakers, and with consultants:1} {Document social determinants of health affecting pt's care:1} {Document your decision making why or why not admission, treatments were needed:1} Final Clinical Impression(s) / ED Diagnoses Final diagnoses:  None    Rx / DC Orders ED Discharge Orders     None

## 2022-06-05 NOTE — ED Triage Notes (Signed)
Pt brought in by [facility] for c/o unresponsiveness  Ems reports pt's husband found pt unresponsive sitting on the toilet. When ems arrived pt was found lying on floor in post-ictal phase  Pt is only oriented to name and dob  Pt keeps repeating "I don't feel myself"

## 2022-06-05 NOTE — Discharge Instructions (Signed)
Your urine today shows that you likely have a urinary tract infection.  Is important that you take the antibiotic as directed until finished.  Drink plenty of water.  Continue to take your routine medications as directed.  Please follow-up with your primary care provider in 1 week to have your urine retested.  Also, please arrange follow-up appointment with your neurologist.  Return to the emergency department for any new or worsening symptoms.

## 2022-06-06 LAB — URINE CULTURE

## 2022-06-06 NOTE — Progress Notes (Deleted)
[facility]   MULTIDISCIPLINARY HEART VALVE CLINIC                                     Cardiology Office Note:    Date:  06/06/2022   ID:  Anne Wolf, DOB [birth year], MRN [id number]  PCP:  Carylon Perches, MD  [facility] Cardiologist:  Dina Rich, MD  [facility] Electrophysiologist:  None   Referring MD: Carylon Perches, MD   No chief complaint on file. ***  History of Present Illness:    Anne Wolf is a 89 y.o. 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    [name] was initially seen 01/18/2021 in consultation for treatment options regarding severe aortic stenosis. As above, she had visited her son in [place] and presented with acute shortness of breath and was found to have paradoxical low-flow low gradient aortic stenosis during her hospital admission. When she returned from [place], an echocardiogram was completed on 12/27/2020 that demonstrating an LVEF of 60 to 65%, mild to moderate mitral stenosis with a mean transmitral gradient of 10 mmHg and mitral valve area by continuity equation of 1.8 cm. She was felt to have moderate to severe aortic stenosis with a mean gradient of 29 mmHg, DVI of 0.3 and normal LV stroke-volume. Cardiac catheterization was completed which demonstrated patent coronary arteries with moderate to severe aortic stenosis mean transvalvular gradient 28 mmHg calculated valve area 1.1 cm and normal right heart hemodynamics. A few weeks after her cardiac catheterization she presented with acute stroke symptoms. Her symptoms resolved without specific treatment. The pre-TAVR CTA studies showed left atrial appendage thrombus. She was felt to have had a atrial fibrillation related stroke  from left atrial appendage thrombus in the setting of interrupted anticoagulation for her procedures. She recovered with no residual defects.    She was evaluated by the multidisciplinary valve team and underwent successful TAVR with a 23 mm Edwards Sapien 3UR THV via the TF approach on 05/31/21. Post operative echo with EF 60%, normally functioning TAVR with a mean gradient of 12 mmHg and no PVL as well as mod-severe MAC with rheumatic mitral valve stenosis with a mean gradient of 9 mm hg. She was resumed on home Coumadin with Lovenox bridge (given previous CVA off Coumadin).     Today the patient presents to clinic for follow up. Here with husband. No CP or SOB. No LE edema, orthopnea or PND. No dizziness or syncope. No blood in stool or urine. No palpitations.             06/05/22 Anne Wolf is a 89 y.o. female with past medical history of atrial fibrillation (anticoagulated on Eliquis), prior TIA, hypertension, type 2 diabetes, aortic stenosis, and prior stroke who presents to the Emergency Department from home via EMS.  Patient was found slumped over on the toilet this morning by her husband.  States she was unresponsive at the time.  Episode occurred approximately 1 and half hours ago.  He states that she was moving one of her arms as if to grab onto something and he noted that she was drooling.  He states similar episode last December in  which she had a seizure.  She takes Keppra daily.  Patient does not recall events, states she woke this morning "not feeling like myself."  Per patient's husband who is at bedside, patient appears to be at her baseline at this time.     Past Medical History:  Diagnosis Date   Aortic stenosis    mild; mild MR; slightly increased pulmonary artery pressure with mild RVH; normal LV-2010   Arthritis    Atrial fibrillation    chronic anticoagulation; adequate HR control on minimal AV nodal blocking medication    Cancer    skin cancers removed from  face   CHF (congestive heart failure)    Diabetes mellitus without complication    2   Dyspnea    Dysrhythmia    Fasting hyperglycemia    + microalbuminuria; A1c of 6.5% in 11/2010   Hematochezia 01/2009   01/2009-presumed ischemic colitis; history of diverticulosis   History of kidney stones    Hx of adenomatous colonic polyps 2005   adenomatous polyp; negative colonoscopy in 2009   Hyperlipidemia    Seizure 01/2022   Stroke     Past Surgical History:  Procedure Laterality Date   CATARACT EXTRACTION W/PHACO Right 03/23/2014   Procedure: CATARACT EXTRACTION PHACO AND INTRAOCULAR LENS PLACEMENT (IOC);  Surgeon: Susa Simmonds, MD;  Location: [facility];  Service: Ophthalmology;  Laterality: Right;  CDE:2.83   CATARACT EXTRACTION W/PHACO Left 07/27/2014   Procedure: CATARACT EXTRACTION PHACO AND INTRAOCULAR LENS PLACEMENT LEFT EYE CDE=6.97;  Surgeon: Susa Simmonds, MD;  Location: [facility];  Service: Ophthalmology;  Laterality: Left;   COLONOSCOPY  2009   negative; prior study with adenomatous polyp   CYSTOSCOPY WITH RETROGRADE PYELOGRAM, URETEROSCOPY AND STENT PLACEMENT Left 04/27/2022   Procedure: CYSTOSCOPY WITH RETROGRADE PYELOGRAM, URETEROSCOPY AND STENT PLACEMENT;  Surgeon: Malen Gauze, MD;  Location: [facility];  Service: Urology;  Laterality: Left;   CYSTOSCOPY/URETEROSCOPY/HOLMIUM LASER/STENT PLACEMENT Right 08/21/2016   Procedure: CYSTOSCOPY/URETEROSCOPY/RETROGRADE PYELOGRAM//STENT PLACEMENT;  Surgeon: Hildred Laser, MD;  Location: [facility];  Service: Urology;  Laterality: Right;   EYE SURGERY     HOLMIUM LASER APPLICATION Left 04/27/2022   Procedure: HOLMIUM LASER APPLICATION;  Surgeon: Malen Gauze, MD;  Location: [facility];  Service: Urology;  Laterality: Left;   INTRAOPERATIVE TRANSTHORACIC ECHOCARDIOGRAM N/A 05/31/2021   Procedure: INTRAOPERATIVE TRANSTHORACIC ECHOCARDIOGRAM;  Surgeon: Tonny Bollman, MD;  Location: [facility] OR;  Service: Open Heart Surgery;  Laterality: N/A;    RIGHT/LEFT HEART CATH AND CORONARY ANGIOGRAPHY N/A 02/04/2021   Procedure: RIGHT/LEFT HEART CATH AND CORONARY ANGIOGRAPHY;  Surgeon: Tonny Bollman, MD;  Location: [facility] INVASIVE CV LAB;  Service: Cardiovascular;  Laterality: N/A;   TRANSCATHETER AORTIC VALVE REPLACEMENT, TRANSFEMORAL N/A 05/31/2021   Procedure: Transcatheter Aortic Valve Replacement 23mm, Transfemoral;  Surgeon: Tonny Bollman, MD;  Location: [facility] OR;  Service: Open Heart Surgery;  Laterality: N/A;  Percutaneous    Current Medications: No outpatient medications have been marked as taking for the 06/07/22 encounter (Appointment) with [facility].     Allergies:   Cat hair extract   Social History   Socioeconomic History   Marital status: Married    Spouse name: Not on file   Number of children:  2   Years of education: Not on file   Highest education level: Not on file  Occupational History   Occupation: Retired  Tobacco Use   Smoking status: Never    Passive exposure: Never   Smokeless tobacco: Never  Vaping Use   Vaping Use: Never used  Substance and Sexual Activity   Alcohol use: No   Drug use: No   Sexual activity: Not Currently  Other Topics Concern   Not on file  Social History Narrative   Married with 2 children   No regular exercise   Social Determinants of Health   Financial Resource Strain: Not on file  Food Insecurity: Not on file  Transportation Needs: Not on file  Physical Activity: Not on file  Stress: Not on file  Social Connections: Not on file     Family History: The patient's ***family history includes Arrhythmia in her sister; Cancer in her father.  ROS:   Please see the history of present illness.    All other systems reviewed and are negative.  EKGs/Labs/Other Studies Reviewed:    The following studies were reviewed today: ***  EKG:  EKG is *** ordered today.  The ekg ordered today demonstrates ***  Recent Labs: 08/19/2021: B Natriuretic Peptide  188.0 06/05/2022: ALT 18; BUN 11; Creatinine, Ser 0.86; Hemoglobin 12.8; Magnesium 1.9; Platelets 161; Potassium 3.6; Sodium 136  Recent Lipid Panel    Component Value Date/Time   CHOL 183 02/11/2022 0707   TRIG 64 02/11/2022 0707   TRIG 127 03/24/2008 0000   HDL 58 02/11/2022 0707   CHOLHDL 3.2 02/11/2022 0707   VLDL 13 02/11/2022 0707   LDLCALC 112 (H) 02/11/2022 0707   LDLCALC 108 03/24/2008 0000     Risk Assessment/Calculations:   {Does this patient have ATRIAL FIBRILLATION?:[phone]}   Physical Exam:    VS:  There were no vitals taken for this visit.    Wt Readings from Last 3 Encounters:  06/05/22 160 lb 15 oz (73 kg)  05/22/22 161 lb 6.4 oz (73.2 kg)  04/27/22 157 lb 13.6 oz (71.6 kg)     GEN: *** Well nourished, well developed in no acute distress HEENT: Normal NECK: No JVD; No carotid bruits LYMPHATICS: No lymphadenopathy CARDIAC: ***RRR, no murmurs, rubs, gallops RESPIRATORY:  Clear to auscultation without rales, wheezing or rhonchi  ABDOMEN: Soft, non-tender, non-distended MUSCULOSKELETAL:  No edema; No deformity  SKIN: Warm and dry NEUROLOGIC:  Alert and oriented x 3 PSYCHIATRIC:  Normal affect   ASSESSMENT:    No diagnosis found. PLAN:    In order of problems listed above:       {Are you ordering a CV Procedure (e.g. stress test, cath, DCCV, TEE, etc)?   Press F2        :[id number]}    Medication Adjustments/Labs and Tests Ordered: Current medicines are reviewed at length with the patient today.  Concerns regarding medicines are outlined above.  No orders of the defined types were placed in this encounter.  No orders of the defined types were placed in this encounter.   There are no Patient Instructions on file for this visit.   Signed, Georgie Chard, NP  06/06/2022 11:30 AM    [facility]

## 2022-06-07 ENCOUNTER — Telehealth: Payer: Self-pay | Admitting: Cardiology

## 2022-06-07 ENCOUNTER — Ambulatory Visit: Payer: No Typology Code available for payment source

## 2022-06-07 ENCOUNTER — Encounter (HOSPITAL_COMMUNITY): Payer: Self-pay

## 2022-06-07 ENCOUNTER — Ambulatory Visit (HOSPITAL_COMMUNITY): Payer: No Typology Code available for payment source

## 2022-06-07 LAB — URINE CULTURE: Culture: 50000 — AB

## 2022-06-07 NOTE — Telephone Encounter (Signed)
NYHA class I symptoms

## 2022-06-07 NOTE — Telephone Encounter (Signed)
  HE[facility] VASCULAR CENTER   MULTIDISCIPLINARY HEART VALVE TEAM   Patient called 1 year follow up with echocardiogram today due to back pain secondary to recently dx UTI. She has follow up renal US to further evaluate after being seen in the ED 4/15. Called patient to reschedule. KCCQ performed and patient rescheduled for one year echo 07/05/22 at 830am at [facility]. Confirmed date and time with husband as well. All questions answered.   Georgie Chard NP-C Structural Heart Team  Pager: [phone] Phone: [phone]

## 2022-06-08 ENCOUNTER — Telehealth (HOSPITAL_BASED_OUTPATIENT_CLINIC_OR_DEPARTMENT_OTHER): Payer: Self-pay | Admitting: *Deleted

## 2022-06-08 ENCOUNTER — Ambulatory Visit
Admission: EM | Admit: 2022-06-08 | Discharge: 2022-06-08 | Disposition: A | Discharge status: Home / Self Care | Payer: No Typology Code available for payment source | Attending: Nurse Practitioner | Admitting: Nurse Practitioner

## 2022-06-08 ENCOUNTER — Encounter: Payer: Self-pay | Admitting: Emergency Medicine

## 2022-06-08 ENCOUNTER — Other Ambulatory Visit: Payer: Self-pay

## 2022-06-08 DIAGNOSIS — R109 Unspecified abdominal pain: Secondary | ICD-10-CM | POA: Insufficient documentation

## 2022-06-08 LAB — POCT URINALYSIS DIP (MANUAL ENTRY)
Glucose, UA: NEGATIVE mg/dL
Nitrite, UA: NEGATIVE
Protein Ur, POC: 30 mg/dL — AB
Spec Grav, UA: >=1.03 — AB (ref 1.010–1.025)
Urobilinogen, UA: 0.2 E.U./dL
pH, UA: 5.5 (ref 5.0–8.0)

## 2022-06-08 MED ORDER — SULFAMETHOXAZOLE-TRIMETHOPRIM 800-160 MG PO TABS: 1.0000 | ORAL_TABLET | Freq: Two times a day (BID) | ORAL | 0 refills | Status: AC

## 2022-06-08 NOTE — Telephone Encounter (Signed)
Post ED Visit - Positive Culture Follow-up  Culture report reviewed by antimicrobial stewardship pharmacist: [facility]  Enzo Bi, Pharm.D.  Celedonio Miyamoto, Pharm.D., BCPS AQ-ID  Garvin Fila, Pharm.D., BCPS  Georgina Pillion, Pharm.D., BCPS  [place], [place].D., BCPS, AAHIVP  Estella Husk, Pharm.D., BCPS, AAHIVP  Lysle Pearl, PharmD, BCPS  Phillips Climes, PharmD, BCPS  Agapito Games, PharmD, BCPS  Verlan Friends, PharmD  Mervyn Gay, PharmD, BCPS  Vinnie Level, PharmD  [facility]  Len Childs, PharmD  Greer Pickerel, PharmD  Adalberto Cole, PharmD  Perlie Gold, Rph  Lonell Face) Jean Rosenthal, PharmD  Earl Many, PharmD  Junita Push, PharmD  Dorna Leitz, PharmD  Terrilee Files, PharmD  Lynann Beaver, PharmD  Keturah Barre, PharmD  Loralee Pacas, PharmD  Bernadene Person, PharmD   Positive urine culture Treated with Cephalexin, organism sensitive to the same   and no further patient follow-up is required at this time.  Wilburn Cornelia, Pharm D  [place] 06/08/2022, 11:05 AM

## 2022-06-08 NOTE — ED Provider Notes (Signed)
[facility]    CSN: [id number] Arrival date & time: 06/08/22  0840      History   Chief Complaint Chief Complaint  Patient presents with   Back Pain    HPI Anne Wolf is a 89 y.o. female.   The history is provided by the patient and the spouse.   The patient presents with her spouse for complaints of back pain.  Patient's husband states that he is unsure of when the patient's pain started.  He denies any fall, injury, or trauma.  Patient's husband states that the patient just told him "my back hurts".  Patient was recently in the emergency department on 06/05/2022 after an unresponsive episode.  The patient was diagnosed with a urinary tract infection, and started on Keflex 500 mg 4 times daily.  Patient's spouse states that he knows when she may be experiencing a UTI as she has a strong odor in her urine.  He states he noticed that last week, and that he continues to notice that symptom.  He states that the patient continues to urinate frequently,  Patient with lithotripsy over the past month performed by Dr. Ronne Binning for nephrolithiasis.  Patient's spouse states that they are scheduled for follow-up with Dr. Ronne Binning next week.  Patient bowels denies fever, chills, chest pain, abdominal pain, nausea, vomiting, or diarrhea.  Patient's spouse states patient has been taking Tylenol for her back pain.  He states that he also try to get in with the patient's primary care physician, but that they received a phone call instructing the patient to continue Tylenol.  Past Medical History:  Diagnosis Date   Aortic stenosis    mild; mild MR; slightly increased pulmonary artery pressure with mild RVH; normal LV-2010   Arthritis    Atrial fibrillation    chronic anticoagulation; adequate HR control on minimal AV nodal blocking medication    Cancer    skin cancers removed from face   CHF (congestive heart failure)    Diabetes mellitus without complication    2   Dyspnea     Dysrhythmia    Fasting hyperglycemia    + microalbuminuria; A1c of 6.5% in 11/2010   Hematochezia 01/2009   01/2009-presumed ischemic colitis; history of diverticulosis   History of kidney stones    Hx of adenomatous colonic polyps 2005   adenomatous polyp; negative colonoscopy in 2009   Hyperlipidemia    Seizure 01/2022   Stroke     Patient Active Problem List   Diagnosis Date Noted   Acute ischemic stroke 02/10/2022   Stroke (cerebrum) 02/10/2022   UTI (urinary tract infection) 05/31/2021   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   Transient ischemic attack (TIA) 02/18/2021   Essential hypertension 02/18/2021   Type 2 diabetes mellitus 02/18/2021   Dyspnea 10/14/2020   Gross hematuria 05/26/2018   Nephrolithiasis 05/26/2018   History of diagnostic tests 08/07/2012   Fasting hyperglycemia    Aortic stenosis    Hx of adenomatous colonic polyps    Long term (current) use of anticoagulants 05/31/2010   Mixed hyperlipidemia 02/08/2009   Hematochezia 01/20/2009    Past Surgical History:  Procedure Laterality Date   CATARACT EXTRACTION W/PHACO Right 03/23/2014   Procedure: CATARACT EXTRACTION PHACO AND INTRAOCULAR LENS PLACEMENT (IOC);  Surgeon: Susa Simmonds, MD;  Location: [facility];  Service: Ophthalmology;  Laterality: Right;  CDE:2.83   CATARACT EXTRACTION W/PHACO Left 07/27/2014   Procedure: CATARACT EXTRACTION PHACO AND INTRAOCULAR LENS PLACEMENT LEFT  EYE CDE=6.97;  Surgeon: Susa Simmonds, MD;  Location: [facility];  Service: Ophthalmology;  Laterality: Left;   COLONOSCOPY  2009   negative; prior study with adenomatous polyp   CYSTOSCOPY WITH RETROGRADE PYELOGRAM, URETEROSCOPY AND STENT PLACEMENT Left 04/27/2022   Procedure: CYSTOSCOPY WITH RETROGRADE PYELOGRAM, URETEROSCOPY AND STENT PLACEMENT;  Surgeon: Malen Gauze, MD;  Location: [facility];  Service: Urology;  Laterality: Left;   CYSTOSCOPY/URETEROSCOPY/HOLMIUM LASER/STENT PLACEMENT Right 08/21/2016    Procedure: CYSTOSCOPY/URETEROSCOPY/RETROGRADE PYELOGRAM//STENT PLACEMENT;  Surgeon: Hildred Laser, MD;  Location: [facility];  Service: Urology;  Laterality: Right;   EYE SURGERY     HOLMIUM LASER APPLICATION Left 04/27/2022   Procedure: HOLMIUM LASER APPLICATION;  Surgeon: Malen Gauze, MD;  Location: [facility];  Service: Urology;  Laterality: Left;   INTRAOPERATIVE TRANSTHORACIC ECHOCARDIOGRAM N/A 05/31/2021   Procedure: INTRAOPERATIVE TRANSTHORACIC ECHOCARDIOGRAM;  Surgeon: Tonny Bollman, MD;  Location: [facility] OR;  Service: Open Heart Surgery;  Laterality: N/A;   RIGHT/LEFT HEART CATH AND CORONARY ANGIOGRAPHY N/A 02/04/2021   Procedure: RIGHT/LEFT HEART CATH AND CORONARY ANGIOGRAPHY;  Surgeon: Tonny Bollman, MD;  Location: [facility] INVASIVE CV LAB;  Service: Cardiovascular;  Laterality: N/A;   TRANSCATHETER AORTIC VALVE REPLACEMENT, TRANSFEMORAL N/A 05/31/2021   Procedure: Transcatheter Aortic Valve Replacement 23mm, Transfemoral;  Surgeon: Tonny Bollman, MD;  Location: [facility] OR;  Service: Open Heart Surgery;  Laterality: N/A;  Percutaneous    OB History   No obstetric history on file.      Home Medications    Prior to Admission medications   Medication Sig Start Date End Date Taking? Authorizing Provider  sulfamethoxazole-trimethoprim (BACTRIM DS) 800-160 MG tablet Take 1 tablet by mouth 2 (two) times daily for 7 days. 06/08/22 06/15/22 Yes Jacinto Keil-Warren, Sadie Haber, NP  alendronate (FOSAMAX) 70 MG tablet Take 70 mg by mouth every Wednesday.    [provider]  apixaban (ELIQUIS) 5 MG TABS tablet Take 1 tablet (5 mg total) by mouth 2 (two) times daily. 04/10/22   Windell Norfolk, MD  cephALEXin (KEFLEX) 500 MG capsule Take 1 capsule (500 mg total) by mouth 4 (four) times daily. 06/05/22   Triplett, Tammy, PA-C  Cholecalciferol (VITAMIN D3) 125 MCG (5000 UT) capsule Take 5,000 Units by mouth daily.    [provider]  levETIRAcetam (KEPPRA) 500 MG tablet Take 1 tablet (500 mg  total) by mouth 2 (two) times daily. 04/10/22 04/05/23  Windell Norfolk, MD  lisinopril (PRINIVIL,ZESTRIL) 10 MG tablet Take 10 mg by mouth at bedtime.    [provider]  metFORMIN (GLUCOPHAGE) 500 MG tablet Take 500 mg by mouth 2 (two) times daily with a meal.     [provider]  metoprolol succinate (TOPROL-XL) 25 MG 24 hr tablet Take 25 mg by mouth at bedtime.    [provider]  mirabegron ER (MYRBETRIQ) 25 MG TB24 tablet Take 1 tablet (25 mg total) by mouth daily. Patient not taking: Reported on 04/24/2022 01/24/22   Malen Gauze, MD  ondansetron (ZOFRAN) 4 MG tablet Take 1 tablet (4 mg total) by mouth daily as needed for nausea or vomiting. Patient not taking: Reported on 05/22/2022 04/27/22 04/27/23  Malen Gauze, MD  rosuvastatin (CRESTOR) 20 MG tablet Take 1 tablet (20 mg total) by mouth daily. Patient taking differently: Take 20 mg by mouth every evening. 02/13/22   de Saintclair Halsted, Cortney E, NP  tamsulosin (FLOMAX) 0.4 MG CAPS capsule Take 1 capsule (0.4 mg total) by mouth daily after supper. Patient not taking:  Reported on 05/22/2022 04/27/22   Malen Gauze, MD    Family History Family History  Problem Relation Age of Onset   Cancer Father    Arrhythmia Sister        Atrial fibrillation    Social History Social History   Tobacco Use   Smoking status: Never    Passive exposure: Never   Smokeless tobacco: Never  Vaping Use   Vaping Use: Never used  Substance Use Topics   Alcohol use: No   Drug use: No     Allergies   Cat hair extract   Review of Systems Review of Systems Per HPI  Physical Exam Triage Vital Signs ED Triage Vitals [06/08/22 0848]  Enc Vitals Group     BP 117/76     Pulse Rate 92     Resp 18     Temp 97.8 F (36.6 C)     Temp Source Oral     SpO2 94 %     Weight      Height      Head Circumference      Peak Flow      Pain Score 10     Pain Loc      Pain Edu?      Excl. in GC?    No data  found.  Updated Vital Signs BP 117/76 (BP Location: Right Arm)   Pulse 92   Temp 97.8 F (36.6 C) (Oral)   Resp 18   SpO2 94%   Visual Acuity Right Eye Distance:   Left Eye Distance:   Bilateral Distance:    Right Eye Near:   Left Eye Near:    Bilateral Near:     Physical Exam Vitals and nursing note reviewed.  Constitutional:      General: She is not in acute distress.    Appearance: Normal appearance.  Eyes:     Extraocular Movements: Extraocular movements intact.     Conjunctiva/sclera: Conjunctivae normal.     Pupils: Pupils are equal, round, and reactive to light.  Cardiovascular:     Rate and Rhythm: Normal rate and regular rhythm.     Pulses: Normal pulses.     Heart sounds: Normal heart sounds.  Pulmonary:     Effort: Pulmonary effort is normal. No respiratory distress.     Breath sounds: Normal breath sounds. No stridor. No wheezing, rhonchi or rales.  Abdominal:     General: Bowel sounds are normal.     Palpations: Abdomen is soft.     Tenderness: There is left CVA tenderness.  Musculoskeletal:     Cervical back: Normal range of motion.  Lymphadenopathy:     Cervical: No cervical adenopathy.  Skin:    General: Skin is warm and dry.  Neurological:     General: No focal deficit present.     Mental Status: She is alert.  Psychiatric:        Mood and Affect: Mood normal.        Behavior: Behavior normal.      UC Treatments / Results  Labs (all labs ordered are listed, but only abnormal results are displayed) Labs Reviewed  POCT URINALYSIS DIP (MANUAL ENTRY) - Abnormal; Notable for the following components:      Result Value   Clarity, UA hazy (*)    Bilirubin, UA small (*)    Ketones, POC UA moderate (40) (*)    Spec Grav, UA >=1.030 (*)    Blood, UA moderate (*)  Protein Ur, POC =30 (*)    Leukocytes, UA Trace (*)    All other components within normal limits  URINE CULTURE    EKG   Radiology No results  found.  Procedures Procedures (including critical care time)  Medications Ordered in UC Medications - No data to display  Initial Impression / Assessment and Plan / UC Course  I have reviewed the triage vital signs and the nursing notes.  Pertinent labs & imaging results that were available during my care of the patient were reviewed by me and considered in my medical decision making (see chart for details).  The patient is well-appearing, she is in no acute distress, vital signs are stable.  On exam, patient has moderate left CVA tenderness.  Cannot rule out pyelonephritis.  Will have patient's husband stop Keflex and start patient on Bactrim 800/160mg  twice daily possible pyelonephritis and to cover for UTI.  Patient's husband was encouraged to have the patient increase her water intake.  Also recommended continuing Tylenol as needed for pain, fever, or general discomfort.  Supportive care recommendations were provided and discussed with the patient's spouse to include developing a toileting schedule for the patient.  Patient's spouse advised to keep the appointment scheduled with Dr. Ronne Binning next week.  Patient's spouse was given strict ER follow-up precautions.  Patient's spouse is in agreement with this plan of care and verbalizes understanding.  All questions were answered.  Patient stable for discharge.   Final Clinical Impressions(s) / UC Diagnoses   Final diagnoses:  Left flank pain     Discharge Instructions      The urinalysis is positive for blood and leukocytes.  It also shows that she needs to increase the amount of water that she is drinking.  A urine culture is being performed.  You will be contacted if the urine culture results are abnormal. Stop cephalexin.  Start the medication that was prescribed today. Try to encourage her to drink water.  This is essential in helping decrease her urinary symptoms and possibility of future kidney stones.  Encouraged her to drink at  least 3-4 bottles of water daily. Continue Tylenol as needed for pain, fever, or general discomfort. Recommend a toileting schedue that allows her to urinate every 2 hours.  If she develops new symptoms such as fever, chills, worsening flank pain, or becomes confused, please go to the emergency department immediately for further evaluation. Keep the appointment with Dr. Ronne Binning scheduled for next week. Follow-up as needed.     ED Prescriptions     Medication Sig Dispense Auth. Provider   sulfamethoxazole-trimethoprim (BACTRIM DS) 800-160 MG tablet Take 1 tablet by mouth 2 (two) times daily for 7 days. 14 tablet Dineen Conradt-Warren, Sadie Haber, NP      PDMP not reviewed this encounter.   Abran Cantor, NP 06/08/22 1010

## 2022-06-08 NOTE — Discharge Instructions (Addendum)
The urinalysis is positive for blood and leukocytes.  It also shows that she needs to increase the amount of water that she is drinking.  A urine culture is being performed.  You will be contacted if the urine culture results are abnormal. Stop cephalexin.  Start the medication that was prescribed today. Try to encourage her to drink water.  This is essential in helping decrease her urinary symptoms and possibility of future kidney stones.  Encouraged her to drink at least 3-4 bottles of water daily. Continue Tylenol as needed for pain, fever, or general discomfort. Recommend a toileting schedue that allows her to urinate every 2 hours.  If she develops new symptoms such as fever, chills, worsening flank pain, or becomes confused, please go to the emergency department immediately for further evaluation. Keep the appointment with Dr. Ronne Binning scheduled for next week. Follow-up as needed.

## 2022-06-08 NOTE — ED Triage Notes (Signed)
Patient presents to [facility] for evaluation of mid lower back pain starting a few days ago.  Patient states she just woke up to it, denies known injury.  Denies changes in urination

## 2022-06-09 LAB — URINE CULTURE

## 2022-06-14 ENCOUNTER — Encounter: Payer: Self-pay | Admitting: Urology

## 2022-06-14 ENCOUNTER — Ambulatory Visit (INDEPENDENT_AMBULATORY_CARE_PROVIDER_SITE_OTHER): Payer: No Typology Code available for payment source | Admitting: Urology

## 2022-06-14 VITALS — BP 100/53 | HR 87

## 2022-06-14 DIAGNOSIS — N3281 Overactive bladder: Secondary | ICD-10-CM | POA: Diagnosis not present

## 2022-06-14 DIAGNOSIS — N390 Urinary tract infection, site not specified: Secondary | ICD-10-CM | POA: Diagnosis not present

## 2022-06-14 DIAGNOSIS — N200 Calculus of kidney: Secondary | ICD-10-CM

## 2022-06-14 MED ORDER — DOXYCYCLINE HYCLATE 100 MG PO CAPS: 100.0000 mg | ORAL_CAPSULE | Freq: Two times a day (BID) | ORAL | 0 refills | Status: DC

## 2022-06-14 MED ORDER — OXYCODONE-ACETAMINOPHEN 5-325 MG PO TABS: 1.0000 | ORAL_TABLET | ORAL | 0 refills | Status: DC | PRN

## 2022-06-14 NOTE — Progress Notes (Signed)
06/14/2022 3:48 PM   Anne Wolf September 21, 1933 [id number]  Referring provider: Carylon Perches, MD [address] [place],  [place] [address]  UTI   HPI: Anne Wolf is a [phone] here for followup for nephrolithiasis and UTI. She developed confusion and was taken to the ER on 4/15. She was started on keflex. She then had a seizure and presented to the ER on 4/18 and urine culture grew multiple species. She is having intermittent left flank pain.    PMH: Past Medical History:  Diagnosis Date   Aortic stenosis    mild; mild MR; slightly increased pulmonary artery pressure with mild RVH; normal LV-2010   Arthritis    Atrial fibrillation    chronic anticoagulation; adequate HR control on minimal AV nodal blocking medication    Cancer    skin cancers removed from face   CHF (congestive heart failure)    Diabetes mellitus without complication    2   Dyspnea    Dysrhythmia    Fasting hyperglycemia    + microalbuminuria; A1c of 6.5% in 11/2010   Hematochezia 01/2009   01/2009-presumed ischemic colitis; history of diverticulosis   History of kidney stones    Hx of adenomatous colonic polyps 2005   adenomatous polyp; negative colonoscopy in 2009   Hyperlipidemia    Seizure 01/2022   Stroke     Surgical History: Past Surgical History:  Procedure Laterality Date   CATARACT EXTRACTION W/PHACO Right 03/23/2014   Procedure: CATARACT EXTRACTION PHACO AND INTRAOCULAR LENS PLACEMENT (IOC);  Surgeon: Susa Simmonds, MD;  Location: [facility];  Service: Ophthalmology;  Laterality: Right;  CDE:2.83   CATARACT EXTRACTION W/PHACO Left 07/27/2014   Procedure: CATARACT EXTRACTION PHACO AND INTRAOCULAR LENS PLACEMENT LEFT EYE CDE=6.97;  Surgeon: Susa Simmonds, MD;  Location: [facility];  Service: Ophthalmology;  Laterality: Left;   COLONOSCOPY  2009   negative; prior study with adenomatous polyp   CYSTOSCOPY WITH RETROGRADE PYELOGRAM, URETEROSCOPY AND STENT PLACEMENT Left 04/27/2022   Procedure:  CYSTOSCOPY WITH RETROGRADE PYELOGRAM, URETEROSCOPY AND STENT PLACEMENT;  Surgeon: Malen Gauze, MD;  Location: [facility];  Service: Urology;  Laterality: Left;   CYSTOSCOPY/URETEROSCOPY/HOLMIUM LASER/STENT PLACEMENT Right 08/21/2016   Procedure: CYSTOSCOPY/URETEROSCOPY/RETROGRADE PYELOGRAM//STENT PLACEMENT;  Surgeon: Hildred Laser, MD;  Location: [facility];  Service: Urology;  Laterality: Right;   EYE SURGERY     HOLMIUM LASER APPLICATION Left 04/27/2022   Procedure: HOLMIUM LASER APPLICATION;  Surgeon: Malen Gauze, MD;  Location: [facility];  Service: Urology;  Laterality: Left;   INTRAOPERATIVE TRANSTHORACIC ECHOCARDIOGRAM N/A 05/31/2021   Procedure: INTRAOPERATIVE TRANSTHORACIC ECHOCARDIOGRAM;  Surgeon: Tonny Bollman, MD;  Location: [facility] OR;  Service: Open Heart Surgery;  Laterality: N/A;   RIGHT/LEFT HEART CATH AND CORONARY ANGIOGRAPHY N/A 02/04/2021   Procedure: RIGHT/LEFT HEART CATH AND CORONARY ANGIOGRAPHY;  Surgeon: Tonny Bollman, MD;  Location: [facility] INVASIVE CV LAB;  Service: Cardiovascular;  Laterality: N/A;   TRANSCATHETER AORTIC VALVE REPLACEMENT, TRANSFEMORAL N/A 05/31/2021   Procedure: Transcatheter Aortic Valve Replacement 23mm, Transfemoral;  Surgeon: Tonny Bollman, MD;  Location: [facility] OR;  Service: Open Heart Surgery;  Laterality: N/A;  Percutaneous    Home Medications:  Allergies as of 06/14/2022       Reactions   Cat Hair Extract Other (See Comments)   Red eyes and Sneezing Also allergic to Dogs        Medication List        Accurate as of June 14, 2022  3:48 PM. If you have  any questions, ask your nurse or doctor.          alendronate 70 MG tablet Commonly known as: FOSAMAX Take 70 mg by mouth every Wednesday.   apixaban 5 MG Tabs tablet Commonly known as: ELIQUIS Take 1 tablet (5 mg total) by mouth 2 (two) times daily.   cephALEXin 500 MG capsule Commonly known as: KEFLEX Take 1 capsule (500 mg total) by mouth 4 (four) times daily.    levETIRAcetam 500 MG tablet Commonly known as: KEPPRA Take 1 tablet (500 mg total) by mouth 2 (two) times daily.   lisinopril 10 MG tablet Commonly known as: ZESTRIL Take 10 mg by mouth at bedtime.   metFORMIN 500 MG tablet Commonly known as: GLUCOPHAGE Take 500 mg by mouth 2 (two) times daily with a meal.   metoprolol succinate 25 MG 24 hr tablet Commonly known as: TOPROL-XL Take 25 mg by mouth at bedtime.   mirabegron ER 25 MG Tb24 tablet Commonly known as: MYRBETRIQ Take 1 tablet (25 mg total) by mouth daily.   ondansetron 4 MG tablet Commonly known as: Zofran Take 1 tablet (4 mg total) by mouth daily as needed for nausea or vomiting.   rosuvastatin 20 MG tablet Commonly known as: CRESTOR Take 1 tablet (20 mg total) by mouth daily. What changed: when to take this   sulfamethoxazole-trimethoprim 800-160 MG tablet Commonly known as: BACTRIM DS Take 1 tablet by mouth 2 (two) times daily for 7 days.   tamsulosin 0.4 MG Caps capsule Commonly known as: Flomax Take 1 capsule (0.4 mg total) by mouth daily after supper.   Vitamin D3 125 MCG (5000 UT) Caps Take 5,000 Units by mouth daily.        Allergies:  Allergies  Allergen Reactions   Cat Hair Extract Other (See Comments)    Red eyes and Sneezing Also allergic to Dogs    Family History: Family History  Problem Relation Age of Onset   Cancer Father    Arrhythmia Sister        Atrial fibrillation    Social History:  reports that she has never smoked. She has never been exposed to tobacco smoke. She has never used smokeless tobacco. She reports that she does not drink alcohol and does not use drugs.  ROS: All other review of systems were reviewed and are negative except what is noted above in HPI  Physical Exam: BP (!) 100/53   Pulse 87   Constitutional:  Alert and oriented, No acute distress. HEENT: [place] AT, moist mucus membranes.  Trachea midline, no masses. Cardiovascular: No clubbing, cyanosis, or  edema. Respiratory: Normal respiratory effort, no increased work of breathing. GI: Abdomen is soft, nontender, nondistended, no abdominal masses GU: No CVA tenderness.  Lymph: No cervical or inguinal lymphadenopathy. Skin: No rashes, bruises or suspicious lesions. Neurologic: Grossly intact, no focal deficits, moving all 4 extremities. Psychiatric: Normal mood and affect.  Laboratory Data: Lab Results  Component Value Date   WBC 8.5 06/05/2022   HGB 12.8 06/05/2022   HCT 39.9 06/05/2022   MCV 91.1 06/05/2022   PLT 161 06/05/2022    Lab Results  Component Value Date   CREATININE 0.86 06/05/2022    No results found for: "PSA"  No results found for: "TESTOSTERONE"  Lab Results  Component Value Date   HGBA1C 6.4 (H) 04/25/2022    Urinalysis    Component Value Date/Time   COLORURINE YELLOW 06/05/2022 1220   APPEARANCEUR CLEAR 06/05/2022 1220   APPEARANCEUR Cloudy (  A) 05/10/2022 1554   LABSPEC 1.012 06/05/2022 1220   PHURINE 6.0 06/05/2022 1220   GLUCOSEU NEGATIVE 06/05/2022 1220   HGBUR MODERATE (A) 06/05/2022 1220   BILIRUBINUR small (A) 06/08/2022 0925   BILIRUBINUR Negative 05/10/2022 1554   KETONESUR moderate (40) (A) 06/08/2022 0925   KETONESUR NEGATIVE 06/05/2022 1220   PROTEINUR =30 (A) 06/08/2022 0925   PROTEINUR 30 (A) 06/05/2022 1220   UROBILINOGEN 0.2 06/08/2022 0925   NITRITE Negative 06/08/2022 0925   NITRITE NEGATIVE 06/05/2022 1220   LEUKOCYTESUR Trace (A) 06/08/2022 0925   LEUKOCYTESUR SMALL (A) 06/05/2022 1220    Lab Results  Component Value Date   LABMICR See below: 05/10/2022   WBCUA 6-10 (A) 05/10/2022   LABEPIT 0-10 05/10/2022   MUCUS Present (A) 01/24/2022   BACTERIA RARE (A) 06/05/2022    Pertinent Imaging:  No results found for this or any previous visit.  No results found for this or any previous visit.  No results found for this or any previous visit.  No results found for this or any previous visit.  No results found for  this or any previous visit.  No valid procedures specified. No results found for this or any previous visit.  Results for orders placed in visit on 01/24/22  CT RENAL STONE STUDY  Narrative CLINICAL DATA:  Urolithiasis.  EXAM: CT ABDOMEN AND PELVIS WITHOUT CONTRAST  TECHNIQUE: Multidetector CT imaging of the abdomen and pelvis was performed following the standard protocol without IV contrast.  RADIATION DOSE REDUCTION: This exam was performed according to the departmental dose-optimization program which includes automated exposure control, adjustment of the mA and/or kV according to patient size and/or use of iterative reconstruction technique.  COMPARISON:  CT 02/15/2021 and older  FINDINGS: Lower chest: There is linear opacity lung bases likely scar or atelectasis. No pleural effusion. Status post TAVR. Question some enlargement of the right atrium. Small hiatal hernia.  Hepatobiliary: On this non IV contrast exam liver has preserved contour. Gallbladder is nondilated.  Pancreas: Moderate pancreatic atrophy with some fatty replacement.  Spleen: Spleen is not enlarged.  Adrenals/Urinary Tract: Normal adrenal glands. No abnormal calcification is seen within the right kidney nor along the course of the right ureter. No left ureteral stone. There are least 4 stones in the left renal pelvis with the largest measuring a proximally 9 mm in cephalocaudal length. Slight ectasia of the renal pelvis but no caliectasis. Mild adjacent fat stranding in this location as well. Preserved contour to the urinary bladder.  Stomach/Bowel: On this non oral contrast exam, large bowel has a normal course and caliber with diffuse colonic stool. Normal appendix. Moderate debris in the nondilated stomach. Small bowel is nondilated on this non oral contrast exam. No free air or free fluid.  Vascular/Lymphatic: Normal caliber abdominal aorta and IVC. Scattered atherosclerotic calcified  plaque along the aorta and branch vessels. No specific abnormal lymph node enlargement present in the abdomen or pelvis.  Reproductive: Uterus and bilateral adnexa are unremarkable.  Other: No abdominal wall hernia or abnormality. No abdominopelvic ascites.  Musculoskeletal: Curvature and degenerative changes seen of the spine. Scattered degenerative changes of the pelvis as well. There is some posterior osteophytes at the T12-L1 level with some stenosis along the central canal.  IMPRESSION: Multiple nonobstructing left-sided renal pelvis stones. No collecting system dilatation. Mild adjacent fat stranding. No ureteral stones.   Electronically Signed By: Karen Kays M.D. On: 02/23/2022 15:56   Assessment & Plan:    1. Frequent UTI -  urine for culture -doxycycline 100mg  BID for 7 days - Urinalysis, Routine w reflex microscopic  2. Nephrolithiasis -STAT CT stone study   No follow-ups on file.  Wilkie Aye, MD  [facility] [place]

## 2022-06-14 NOTE — Patient Instructions (Signed)
Urinary Tract Infection, Adult  A urinary tract infection (UTI) is an infection of any part of the urinary tract. The urinary tract includes the kidneys, ureters, bladder, and urethra. These organs make, store, and get rid of urine in the body. An upper UTI affects the ureters and kidneys. A lower UTI affects the bladder and urethra. What are the causes? Most urinary tract infections are caused by bacteria in your genital area around your urethra, where urine leaves your body. These bacteria grow and cause inflammation of your urinary tract. What increases the risk? You are more likely to develop this condition if: You have a urinary catheter that stays in place. You are not able to control when you urinate or have a bowel movement (incontinence). You are female and you: Use a spermicide or diaphragm for birth control. Have low estrogen levels. Are pregnant. You have certain genes that increase your risk. You are sexually active. You take antibiotic medicines. You have a condition that causes your flow of urine to slow down, such as: An enlarged prostate, if you are female. Blockage in your urethra. A kidney stone. A nerve condition that affects your bladder control (neurogenic bladder). Not getting enough to drink, or not urinating often. You have certain medical conditions, such as: Diabetes. A weak disease-fighting system (immunesystem). Sickle cell disease. Gout. Spinal cord injury. What are the signs or symptoms? Symptoms of this condition include: Needing to urinate right away (urgency). Frequent urination. This may include small amounts of urine each time you urinate. Pain or burning with urination. Blood in the urine. Urine that smells bad or unusual. Trouble urinating. Cloudy urine. Vaginal discharge, if you are female. Pain in the abdomen or the lower back. You may also have: Vomiting or a decreased appetite. Confusion. Irritability or tiredness. A fever or  chills. Diarrhea. The first symptom in older adults may be confusion. In some cases, they may not have any symptoms until the infection has worsened. How is this diagnosed? This condition is diagnosed based on your medical history and a physical exam. You may also have other tests, including: Urine tests. Blood tests. Tests for STIs (sexually transmitted infections). If you have had more than one UTI, a cystoscopy or imaging studies may be done to determine the cause of the infections. How is this treated? Treatment for this condition includes: Antibiotic medicine. Over-the-counter medicines to treat discomfort. Drinking enough water to stay hydrated. If you have frequent infections or have other conditions such as a kidney stone, you may need to see a health care provider who specializes in the urinary tract (urologist). In rare cases, urinary tract infections can cause sepsis. Sepsis is a life-threatening condition that occurs when the body responds to an infection. Sepsis is treated in the hospital with IV antibiotics, fluids, and other medicines. Follow these instructions at home:  Medicines Take over-the-counter and prescription medicines only as told by your health care provider. If you were prescribed an antibiotic medicine, take it as told by your health care provider. Do not stop using the antibiotic even if you start to feel better. General instructions Make sure you: Empty your bladder often and completely. Do not hold urine for long periods of time. Empty your bladder after sex. Wipe from front to back after urinating or having a bowel movement if you are female. Use each tissue only one time when you wipe. Drink enough fluid to keep your urine pale yellow. Keep all follow-up visits. This is important. Contact a health   care provider if: Your symptoms do not get better after 1-2 days. Your symptoms go away and then return. Get help right away if: You have severe pain in  your back or your lower abdomen. You have a fever or chills. You have nausea or vomiting. Summary A urinary tract infection (UTI) is an infection of any part of the urinary tract, which includes the kidneys, ureters, bladder, and urethra. Most urinary tract infections are caused by bacteria in your genital area. Treatment for this condition often includes antibiotic medicines. If you were prescribed an antibiotic medicine, take it as told by your health care provider. Do not stop using the antibiotic even if you start to feel better. Keep all follow-up visits. This is important. This information is not intended to replace advice given to you by your health care provider. Make sure you discuss any questions you have with your health care provider. Document Revised: 09/19/2019 Document Reviewed: 09/19/2019 Elsevier Patient Education  2023 [employer]  

## 2022-06-15 LAB — MICROSCOPIC EXAMINATION

## 2022-06-15 LAB — URINALYSIS, ROUTINE W REFLEX MICROSCOPIC
Bilirubin, UA: NEGATIVE
Glucose, UA: NEGATIVE
Leukocytes,UA: NEGATIVE
Nitrite, UA: NEGATIVE
Specific Gravity, UA: 1.03 (ref 1.005–1.030)
Urobilinogen, Ur: 0.2 mg/dL (ref 0.2–1.0)
pH, UA: 5 (ref 5.0–7.5)

## 2022-06-16 ENCOUNTER — Ambulatory Visit (HOSPITAL_COMMUNITY)
Admission: RE | Admit: 2022-06-16 | Discharge: 2022-06-16 | Disposition: A | Discharge status: Home / Self Care | Payer: No Typology Code available for payment source | Source: Ambulatory Visit | Attending: Urology | Admitting: Urology

## 2022-06-16 DIAGNOSIS — N200 Calculus of kidney: Secondary | ICD-10-CM | POA: Insufficient documentation

## 2022-06-16 DIAGNOSIS — K5730 Diverticulosis of large intestine without perforation or abscess without bleeding: Secondary | ICD-10-CM | POA: Diagnosis not present

## 2022-06-20 ENCOUNTER — Telehealth: Payer: Self-pay

## 2022-06-20 NOTE — Telephone Encounter (Signed)
Patients husband in office requesting CT results. Husband states that his wife is still in pain and is not getting any better. Please advise.

## 2022-06-20 NOTE — Telephone Encounter (Signed)
Opened in error

## 2022-06-21 ENCOUNTER — Ambulatory Visit: Payer: No Typology Code available for payment source

## 2022-06-26 ENCOUNTER — Other Ambulatory Visit (HOSPITAL_COMMUNITY): Payer: Self-pay | Admitting: Internal Medicine

## 2022-06-26 ENCOUNTER — Ambulatory Visit (HOSPITAL_COMMUNITY)
Admission: RE | Admit: 2022-06-26 | Discharge: 2022-06-26 | Disposition: A | Discharge status: Home / Self Care | Payer: No Typology Code available for payment source | Source: Ambulatory Visit | Attending: Internal Medicine | Admitting: Internal Medicine

## 2022-06-26 DIAGNOSIS — M545 Low back pain, unspecified: Secondary | ICD-10-CM | POA: Diagnosis not present

## 2022-06-26 DIAGNOSIS — M546 Pain in thoracic spine: Secondary | ICD-10-CM | POA: Diagnosis not present

## 2022-06-28 ENCOUNTER — Ambulatory Visit: Payer: No Typology Code available for payment source | Admitting: Urology

## 2022-06-28 ENCOUNTER — Encounter: Payer: Self-pay | Admitting: Urology

## 2022-06-28 VITALS — BP 126/64 | HR 91

## 2022-06-28 DIAGNOSIS — N390 Urinary tract infection, site not specified: Secondary | ICD-10-CM | POA: Diagnosis not present

## 2022-06-28 DIAGNOSIS — N200 Calculus of kidney: Secondary | ICD-10-CM | POA: Diagnosis not present

## 2022-06-28 LAB — URINALYSIS, ROUTINE W REFLEX MICROSCOPIC
Bilirubin, UA: NEGATIVE
Glucose, UA: NEGATIVE
Ketones, UA: NEGATIVE
Nitrite, UA: NEGATIVE
Specific Gravity, UA: 1.03 (ref 1.005–1.030)
Urobilinogen, Ur: 0.2 mg/dL (ref 0.2–1.0)
pH, UA: 5 (ref 5.0–7.5)

## 2022-06-28 LAB — MICROSCOPIC EXAMINATION

## 2022-06-28 NOTE — Progress Notes (Signed)
06/28/2022 2:16 PM   Anne Wolf 07/11/1933 [id number]  Referring provider: Carylon Perches, MD [address] [place],  [place] [address]  nephrolithiasis   HPI: [name] is a [phone] here for followup for nephrolithiasis. CT from 06/16/2022 showed left lower pole calculus and moderate stool burden. UA today shows CaOx crystals and rare bacteria. She drinks 20oz of water daily. She drinks 1 Sundrop daily.    PMH: Past Medical History:  Diagnosis Date   Aortic stenosis    mild; mild MR; slightly increased pulmonary artery pressure with mild RVH; normal LV-2010   Arthritis    Atrial fibrillation (HCC)    chronic anticoagulation; adequate HR control on minimal AV nodal blocking medication    Cancer (HCC)    skin cancers removed from face   CHF (congestive heart failure) (HCC)    Diabetes mellitus without complication (HCC)    2   Dyspnea    Dysrhythmia    Fasting hyperglycemia    + microalbuminuria; A1c of 6.5% in 11/2010   Hematochezia 01/2009   01/2009-presumed ischemic colitis; history of diverticulosis   History of kidney stones    Hx of adenomatous colonic polyps 2005   adenomatous polyp; negative colonoscopy in 2009   Hyperlipidemia    Seizure (HCC) 01/2022   Stroke [facility])     Surgical History: Past Surgical History:  Procedure Laterality Date   CATARACT EXTRACTION W/PHACO Right 03/23/2014   Procedure: CATARACT EXTRACTION PHACO AND INTRAOCULAR LENS PLACEMENT (IOC);  Surgeon: Susa Simmonds, MD;  Location: [facility];  Service: Ophthalmology;  Laterality: Right;  CDE:2.83   CATARACT EXTRACTION W/PHACO Left 07/27/2014   Procedure: CATARACT EXTRACTION PHACO AND INTRAOCULAR LENS PLACEMENT LEFT EYE CDE=6.97;  Surgeon: Susa Simmonds, MD;  Location: [facility];  Service: Ophthalmology;  Laterality: Left;   COLONOSCOPY  2009   negative; prior study with adenomatous polyp   CYSTOSCOPY WITH RETROGRADE PYELOGRAM, URETEROSCOPY AND STENT PLACEMENT Left 04/27/2022   Procedure:  CYSTOSCOPY WITH RETROGRADE PYELOGRAM, URETEROSCOPY AND STENT PLACEMENT;  Surgeon: Malen Gauze, MD;  Location: [facility];  Service: Urology;  Laterality: Left;   CYSTOSCOPY/URETEROSCOPY/HOLMIUM LASER/STENT PLACEMENT Right 08/21/2016   Procedure: CYSTOSCOPY/URETEROSCOPY/RETROGRADE PYELOGRAM//STENT PLACEMENT;  Surgeon: Hildred Laser, MD;  Location: [facility];  Service: Urology;  Laterality: Right;   EYE SURGERY     HOLMIUM LASER APPLICATION Left 04/27/2022   Procedure: HOLMIUM LASER APPLICATION;  Surgeon: Malen Gauze, MD;  Location: [facility];  Service: Urology;  Laterality: Left;   INTRAOPERATIVE TRANSTHORACIC ECHOCARDIOGRAM N/A 05/31/2021   Procedure: INTRAOPERATIVE TRANSTHORACIC ECHOCARDIOGRAM;  Surgeon: Tonny Bollman, MD;  Location: [facility] OR;  Service: Open Heart Surgery;  Laterality: N/A;   RIGHT/LEFT HEART CATH AND CORONARY ANGIOGRAPHY N/A 02/04/2021   Procedure: RIGHT/LEFT HEART CATH AND CORONARY ANGIOGRAPHY;  Surgeon: Tonny Bollman, MD;  Location: [facility] INVASIVE CV LAB;  Service: Cardiovascular;  Laterality: N/A;   TRANSCATHETER AORTIC VALVE REPLACEMENT, TRANSFEMORAL N/A 05/31/2021   Procedure: Transcatheter Aortic Valve Replacement 23mm, Transfemoral;  Surgeon: Tonny Bollman, MD;  Location: [facility] OR;  Service: Open Heart Surgery;  Laterality: N/A;  Percutaneous    Home Medications:  Allergies as of 06/28/2022       Reactions   Cat Hair Extract Other (See Comments)   Red eyes and Sneezing Also allergic to Dogs        Medication List        Accurate as of Jun 28, 2022  2:16 PM. If you have any questions, ask your nurse or  doctor.          alendronate 70 MG tablet Commonly known as: FOSAMAX Take 70 mg by mouth every Wednesday.   apixaban 5 MG Tabs tablet Commonly known as: ELIQUIS Take 1 tablet (5 mg total) by mouth 2 (two) times daily.   cephALEXin 500 MG capsule Commonly known as: KEFLEX Take 1 capsule (500 mg total) by mouth 4 (four) times daily.   doxycycline  100 MG capsule Commonly known as: VIBRAMYCIN Take 1 capsule (100 mg total) by mouth every 12 (twelve) hours.   levETIRAcetam 500 MG tablet Commonly known as: KEPPRA Take 1 tablet (500 mg total) by mouth 2 (two) times daily.   lisinopril 10 MG tablet Commonly known as: ZESTRIL Take 10 mg by mouth at bedtime.   metFORMIN 500 MG tablet Commonly known as: GLUCOPHAGE Take 500 mg by mouth 2 (two) times daily with a meal.   metoprolol succinate 25 MG 24 hr tablet Commonly known as: TOPROL-XL Take 25 mg by mouth at bedtime.   mirabegron ER 25 MG Tb24 tablet Commonly known as: MYRBETRIQ Take 1 tablet (25 mg total) by mouth daily.   ondansetron 4 MG tablet Commonly known as: Zofran Take 1 tablet (4 mg total) by mouth daily as needed for nausea or vomiting.   oxyCODONE-acetaminophen 5-325 MG tablet Commonly known as: Percocet Take 1 tablet by mouth every 4 (four) hours as needed.   rosuvastatin 20 MG tablet Commonly known as: CRESTOR Take 1 tablet (20 mg total) by mouth daily. What changed: when to take this   tamsulosin 0.4 MG Caps capsule Commonly known as: Flomax Take 1 capsule (0.4 mg total) by mouth daily after supper.   Vitamin D3 125 MCG (5000 UT) Caps Take 5,000 Units by mouth daily.        Allergies:  Allergies  Allergen Reactions   Cat Hair Extract Other (See Comments)    Red eyes and Sneezing Also allergic to Dogs    Family History: Family History  Problem Relation Age of Onset   Cancer Father    Arrhythmia Sister        Atrial fibrillation    Social History:  reports that she has never smoked. She has never been exposed to tobacco smoke. She has never used smokeless tobacco. She reports that she does not drink alcohol and does not use drugs.  ROS: All other review of systems were reviewed and are negative except what is noted above in HPI  Physical Exam: BP 126/64   Pulse 91   Constitutional:  Alert and oriented, No acute distress. HEENT: [place]  AT, moist mucus membranes.  Trachea midline, no masses. Cardiovascular: No clubbing, cyanosis, or edema. Respiratory: Normal respiratory effort, no increased work of breathing. GI: Abdomen is soft, nontender, nondistended, no abdominal masses GU: No CVA tenderness.  Lymph: No cervical or inguinal lymphadenopathy. Skin: No rashes, bruises or suspicious lesions. Neurologic: Grossly intact, no focal deficits, moving all 4 extremities. Psychiatric: Normal mood and affect.  Laboratory Data: Lab Results  Component Value Date   WBC 8.5 06/05/2022   HGB 12.8 06/05/2022   HCT 39.9 06/05/2022   MCV 91.1 06/05/2022   PLT 161 06/05/2022    Lab Results  Component Value Date   CREATININE 0.86 06/05/2022    No results found for: "PSA"  No results found for: "TESTOSTERONE"  Lab Results  Component Value Date   HGBA1C 6.4 (H) 04/25/2022    Urinalysis    Component Value Date/Time   COLORURINE  YELLOW 06/05/2022 1220   APPEARANCEUR Clear 06/14/2022 1516   LABSPEC 1.012 06/05/2022 1220   PHURINE 6.0 06/05/2022 1220   GLUCOSEU Negative 06/14/2022 1516   HGBUR MODERATE (A) 06/05/2022 1220   BILIRUBINUR Negative 06/14/2022 1516   KETONESUR moderate (40) (A) 06/08/2022 0925   KETONESUR NEGATIVE 06/05/2022 1220   PROTEINUR 1+ (A) 06/14/2022 1516   PROTEINUR 30 (A) 06/05/2022 1220   UROBILINOGEN 0.2 06/08/2022 0925   NITRITE Negative 06/14/2022 1516   NITRITE NEGATIVE 06/05/2022 1220   LEUKOCYTESUR Negative 06/14/2022 1516   LEUKOCYTESUR SMALL (A) 06/05/2022 1220    Lab Results  Component Value Date   LABMICR See below: 06/14/2022   WBCUA 6-10 (A) 06/14/2022   LABEPIT 0-10 06/14/2022   MUCUS Present (A) 01/24/2022   BACTERIA Few (A) 06/14/2022    Pertinent Imaging: CT 06/16/2022: Images reviewed and discussed with the patient  No results found for this or any previous visit.  No results found for this or any previous visit.  No results found for this or any previous  visit.  No results found for this or any previous visit.  No results found for this or any previous visit.  No valid procedures specified. No results found for this or any previous visit.  Results for orders placed in visit on 06/14/22  CT RENAL STONE STUDY  Narrative CLINICAL DATA:  Nephrolithiasis and urinary tract infection. Intermittent left flank pain.  EXAM: CT ABDOMEN AND PELVIS WITHOUT CONTRAST  TECHNIQUE: Multidetector CT imaging of the abdomen and pelvis was performed following the standard protocol without IV contrast.  RADIATION DOSE REDUCTION: This exam was performed according to the departmental dose-optimization program which includes automated exposure control, adjustment of the mA and/or kV according to patient size and/or use of iterative reconstruction technique.  COMPARISON:  02/22/2022  FINDINGS: Lower chest: Atelectasis anteriorly in the right lower lobe increased from previous. Mild scarring in both lower lobes as well as the right middle lobe and lingula. TAVR noted. Mitral valve calcification. Descending thoracic aortic atherosclerotic calcification. Calcifications noted in the left anterior descending and right coronary arteries.  Hepatobiliary: Dependent density in the gallbladder could reflect gallstones or sludge. Hepatic morphology with prominence of the caudate and lateral segment left hepatic lobe could reflect early cirrhosis.  Pancreas: Unremarkable  Spleen: Unremarkable  Adrenals/Urinary Tract: Scarring in the right mid and lower kidney. Both adrenal glands appear normal. No hydronephrosis.  Three small nonobstructive left renal calculi are observed, the largest in the lower pole measuring 0.3 cm in long axis. No ureteral or bladder calculus observed.  Stomach/Bowel: Mild sigmoid colon diverticulosis. Prominence of stool in the proximal half of the colon but not the distal half. No significant small bowel abnormality is  observed. Small coil tubular structure posterior to the cecum likely represents normal appendix.  Vascular/Lymphatic: Atherosclerosis is present, including aortoiliac atherosclerotic disease. Atheromatous plaque noted at the origins of the celiac trunk and SMA with somewhat small caliber celiac artery. My sense is that the SMA is probably supplying the hepatic artery. Today's exam was not a CT angiogram or even performed with contrast.  No pathologic adenopathy.  Reproductive: Unremarkable  Other: No supplemental non-categorized findings.  Musculoskeletal: Lower thoracic and lumbar spondylosis. Mild to moderate degenerative arthropathy of both hips.  IMPRESSION: 1. Nonobstructive left nephrolithiasis. No hydronephrosis or ureteral calculus identified. 2. Prominent stool in the proximal half of the colon but not the distal half. 3. Hepatic morphology with prominence of the caudate and lateral segment left hepatic  lobe, could reflect early cirrhosis. 4. Dependent density in the gallbladder could reflect gallstones or sludge. 5. Mild atelectasis anteriorly in the right lower lobe, increased from previous. Mild scarring in both lower lobes as well as the right middle lobe and lingula. 6. Mitral valve calcification. Prior TAVR. Coronary atherosclerosis. 7. Mild to moderate degenerative arthropathy of both hips. Lower thoracic and lumbar spondylosis. 8. Mild sigmoid colon diverticulosis. 9. Small caliber celiac artery with atheromatous plaque at the origins of the celiac trunk and SMA. The SMA is probably supplying the hepatic artery. 10. Scarring in the right mid and lower kidney.  Aortic Atherosclerosis (ICD10-I70.0).   Electronically Signed By: Gaylyn Rong M.D. On: 06/16/2022 11:12   Assessment & Plan:    1. Nephrolithiasis -increase water intake and decrease sundrop consumptions - Urinalysis, Routine w reflex microscopic  2. Frequent UTI -urine for  culture   No follow-ups on file.  Wilkie Aye, MD  [facility]

## 2022-06-28 NOTE — Patient Instructions (Signed)

## 2022-06-30 LAB — URINE CULTURE: Organism ID, Bacteria: NO GROWTH

## 2022-07-05 ENCOUNTER — Ambulatory Visit (HOSPITAL_COMMUNITY)
Admission: RE | Admit: 2022-07-05 | Discharge: 2022-07-05 | Disposition: A | Discharge status: Home / Self Care | Payer: No Typology Code available for payment source | Source: Ambulatory Visit | Attending: Cardiology | Admitting: Cardiology

## 2022-07-05 DIAGNOSIS — I4819 Other persistent atrial fibrillation: Secondary | ICD-10-CM | POA: Insufficient documentation

## 2022-07-05 DIAGNOSIS — I5032 Chronic diastolic (congestive) heart failure: Secondary | ICD-10-CM | POA: Diagnosis not present

## 2022-07-05 DIAGNOSIS — I10 Essential (primary) hypertension: Secondary | ICD-10-CM | POA: Insufficient documentation

## 2022-07-05 DIAGNOSIS — Z952 Presence of prosthetic heart valve: Secondary | ICD-10-CM

## 2022-07-05 LAB — ECHOCARDIOGRAM COMPLETE
AR max vel: 1.43 cm2
AV Area VTI: 1.32 cm2
AV Area mean vel: 1.41 cm2
AV Mean grad: 10.5 mmHg
AV Peak grad: 17 mmHg
Ao pk vel: 2.06 m/s
Area-P 1/2: 1.91 cm2
MV VTI: 1.28 cm2
S' Lateral: 2.4 cm

## 2022-07-05 NOTE — Progress Notes (Signed)
*  PRELIMINARY RESULTS* Echocardiogram 2D Echocardiogram has been performed.  Anne Wolf 07/05/2022, 9:24 AM

## 2022-07-06 ENCOUNTER — Telehealth: Payer: Self-pay

## 2022-07-06 DIAGNOSIS — E1129 Type 2 diabetes mellitus with other diabetic kidney complication: Secondary | ICD-10-CM | POA: Diagnosis not present

## 2022-07-06 NOTE — Telephone Encounter (Signed)
Md's response to negative urine culture sent via my chart. Patient has an active my chart account. Patient has access to all labs via my chart.

## 2022-07-06 NOTE — Telephone Encounter (Signed)
Patient's husband needing the latest urine analysis results.  Please advise.

## 2022-07-07 ENCOUNTER — Ambulatory Visit (HOSPITAL_COMMUNITY)
Admission: RE | Admit: 2022-07-07 | Discharge: 2022-07-07 | Disposition: A | Discharge status: Home / Self Care | Payer: No Typology Code available for payment source | Source: Ambulatory Visit | Attending: Urology | Admitting: Urology

## 2022-07-07 DIAGNOSIS — N200 Calculus of kidney: Secondary | ICD-10-CM

## 2022-07-11 ENCOUNTER — Encounter: Payer: Self-pay | Admitting: Neurology

## 2022-07-11 ENCOUNTER — Ambulatory Visit (INDEPENDENT_AMBULATORY_CARE_PROVIDER_SITE_OTHER): Payer: No Typology Code available for payment source | Admitting: Neurology

## 2022-07-11 VITALS — BP 148/70 | HR 72 | Ht 62.0 in | Wt 153.5 lb

## 2022-07-11 DIAGNOSIS — F02A0 Dementia in other diseases classified elsewhere, mild, without behavioral disturbance, psychotic disturbance, mood disturbance, and anxiety: Secondary | ICD-10-CM | POA: Diagnosis not present

## 2022-07-11 DIAGNOSIS — R569 Unspecified convulsions: Secondary | ICD-10-CM

## 2022-07-11 DIAGNOSIS — G301 Alzheimer's disease with late onset: Secondary | ICD-10-CM | POA: Diagnosis not present

## 2022-07-11 MED ORDER — DONEPEZIL HCL 5 MG PO TABS: 5.0000 mg | ORAL_TABLET | Freq: Every day | ORAL | 6 refills | Status: DC

## 2022-07-11 MED ORDER — DONEPEZIL HCL 5 MG PO TABS
5.0000 mg | ORAL_TABLET | Freq: Every day | ORAL | 6 refills | Status: DC
Start: 2022-07-11 — End: 2022-07-11

## 2022-07-11 NOTE — Patient Instructions (Signed)
Continue with Keppra 500 mg twice daily Start Aricept 5 mg nightly, side effect from medication including diarrhea, vivid dreams and dizziness discussed with patient ATN profile to look for Alzheimer disease biomarker Follow-up in 1 year or sooner if worse.   There are well-accepted and sensible ways to reduce risk for Alzheimers disease and other degenerative brain disorders .  Exercise Daily Walk A daily 20 minute walk should be part of your routine. Disease related apathy can be a significant roadblock to exercise and the only way to overcome this is to make it a daily routine and perhaps have a reward at the end (something your loved one loves to eat or drink perhaps) or a personal trainer coming to the home can also be very useful. Most importantly, the patient is much more likely to exercise if the caregiver / spouse does it with him/her. In general a structured, repetitive schedule is best.  General Health: Any diseases which effect your body will effect your brain such as a pneumonia, urinary infection, blood clot, heart attack or stroke. Keep contact with your primary care doctor for regular follow ups.  Sleep. A good nights sleep is healthy for the brain. Seven hours is recommended. If you have insomnia or poor sleep habits we can give you some instructions. If you have sleep apnea wear your mask.  Diet: Eating a heart healthy diet is also a good idea; fish and poultry instead of red meat, nuts (mostly non-peanuts), vegetables, fruits, olive oil or canola oil (instead of butter), minimal salt (use other spices to flavor foods), whole grain rice, bread, cereal and pasta and wine in moderation.Research is now showing that the MIND diet, which is a combination of The Mediterranean diet and the DASH diet, is beneficial for cognitive processing and longevity. Information about this diet can be found in The MIND Diet, a book by Alonna Minium, MS, RDN, and online at  [employer]  Finances, Power of [address] and Advance Directives: You should consider putting legal safeguards in place with regard to financial and medical decision making. While the spouse always has power of attorney for medical and financial issues in the absence of any form, you should consider what you want in case the spouse / caregiver is no longer around or capable of making decisions.

## 2022-07-11 NOTE — Progress Notes (Signed)
[facility]  PATIENT: Anne Wolf DOB: 1933/10/28  REQUESTING CLINICIAN: Carylon Perches, MD HISTORY FROM: Patient and husband  REASON FOR VISIT: New onset seizure    HISTORICAL  CHIEF COMPLAINT:  Chief Complaint  Patient presents with   Follow-up    Rm12, husband present [facility] 13 ZO:XWRU one 06/05/22, severe back pain left lower back pain for the past month(husband thinks may have been kidney stone), memory loss   INTERVAL HISTORY 07/11/2022:  Patient presented for follow-up, she is accompanied by her husband.  Last visit was in February.  Since then she has been compliant with the Keppra 500 mg twice daily but husband reports patient having a seizure or seizure-like activity on April 15.  On that day hospital and found her slumped on the toilet and unresponsive.  She did not have any abnormal movement but EMS was called and patient taken to the hospital.  In the hospital she was back to her normal self and discharged home.  Few days later she presented to the hospital complaining of flank pain and diagnosed with kidney stone.  Husband reports that patient does not drink a lot of water, she is almost always dehydrated and he has to force her to drink water.   Her main concern today is her memory. Husband repeats even though patient is independent in her ADLs, she relies on him a lot. She is repetitive, forgetful and her short term memory is very poor. Patient feels like her memory is not what it used to be, that not a big problem for her. She denies any family history of dementia.    HISTORY OF PRESENT ILLNESS:  This is a 89 year old woman past medical history atrial fibrillation, hypertension, hyperlipidemia, diabetes mellitus, and previous stroke in December 2022 who is presenting after being admitted to the hospital for new onset seizure in December 2023.  Per husband patient was at home, and he noted that he had a blank stare, was unresponsive and followed by  generalized convulsion.  This lasted less than 5-minute.  She was confused afterward.  He took her to the hospital.  In the ED patient was noted to have a left hemiplegia with right gaze deviation.  Her CT head CT was negative for any acute bleed therefore she was given TKN.  Her MRI was negative for any acute stroke therefore seizure was considered.  She was started on Keppra 500 mg twice daily.  Since being on Keppra she has not had any additional event concerning for seizure.  While in the hospital her INR was low at 1.6 while being on Coumadin.  Upon discharge she was switched to Eliquis.   Handedness: Right handed   Onset: February 10 2022  Seizure Type: Generalized convulsion  Current frequency: Only once   Any injuries from seizures: Denies   Seizure risk factors: Stroke   Previous ASMs: None   Currenty ASMs: Levetiracetam 500 mg twice daily   ASMs side effects: Denies   Brain Images: No acute abnormality, previous right basal ganglia stroke   Previous EEGs: Normal EEG    OTHER MEDICAL CONDITIONS: Atrial fibrillation, Hypertension, Hyperlipidemia, Diabetes, Seizure disorder   REVIEW OF SYSTEMS: Full 14 system review of systems performed and negative with exception of: As noted in the HPI   ALLERGIES: Allergies  Allergen Reactions   Cat Hair Extract Other (See Comments)    Red eyes and Sneezing Also allergic to Dogs    HOME MEDICATIONS: Outpatient Medications Prior to Visit  Medication Sig Dispense Refill   alendronate (FOSAMAX) 70 MG tablet Take 70 mg by mouth every Wednesday.     apixaban (ELIQUIS) 5 MG TABS tablet Take 1 tablet (5 mg total) by mouth 2 (two) times daily. 60 tablet 11   Cholecalciferol (VITAMIN D3) 125 MCG (5000 UT) capsule Take 5,000 Units by mouth daily.     levETIRAcetam (KEPPRA) 500 MG tablet Take 1 tablet (500 mg total) by mouth 2 (two) times daily. 180 tablet 3   lisinopril (PRINIVIL,ZESTRIL) 10 MG tablet Take 10 mg by mouth at bedtime.      metFORMIN (GLUCOPHAGE) 500 MG tablet Take 500 mg by mouth 2 (two) times daily with a meal.     rosuvastatin (CRESTOR) 20 MG tablet Take 1 tablet (20 mg total) by mouth daily. 30 tablet 1   metoprolol succinate (TOPROL-XL) 25 MG 24 hr tablet Take 25 mg by mouth at bedtime. (Patient not taking: Reported on 06/14/2022)     mirabegron ER (MYRBETRIQ) 25 MG TB24 tablet Take 1 tablet (25 mg total) by mouth daily. (Patient not taking: Reported on 04/24/2022) 30 tablet 0   tamsulosin (FLOMAX) 0.4 MG CAPS capsule Take 1 capsule (0.4 mg total) by mouth daily after supper. (Patient not taking: Reported on 05/22/2022) 30 capsule 0   cephALEXin (KEFLEX) 500 MG capsule Take 1 capsule (500 mg total) by mouth 4 (four) times daily. (Patient not taking: Reported on 06/14/2022) 28 capsule 0   doxycycline (VIBRAMYCIN) 100 MG capsule Take 1 capsule (100 mg total) by mouth every 12 (twelve) hours. (Patient not taking: Reported on 06/28/2022) 14 capsule 0   ondansetron (ZOFRAN) 4 MG tablet Take 1 tablet (4 mg total) by mouth daily as needed for nausea or vomiting. (Patient not taking: Reported on 05/22/2022) 30 tablet 1   oxyCODONE-acetaminophen (PERCOCET) 5-325 MG tablet Take 1 tablet by mouth every 4 (four) hours as needed. 30 tablet 0   No facility-administered medications prior to visit.    PAST MEDICAL HISTORY: Past Medical History:  Diagnosis Date   Aortic stenosis    mild; mild MR; slightly increased pulmonary artery pressure with mild RVH; normal LV-2010   Arthritis    Atrial fibrillation (HCC)    chronic anticoagulation; adequate HR control on minimal AV nodal blocking medication    Cancer (HCC)    skin cancers removed from face   CHF (congestive heart failure) (HCC)    Diabetes mellitus without complication (HCC)    2   Dyspnea    Dysrhythmia    Fasting hyperglycemia    + microalbuminuria; A1c of 6.5% in 11/2010   Hematochezia 01/2009   01/2009-presumed ischemic colitis; history of diverticulosis   History  of kidney stones    Hx of adenomatous colonic polyps 2005   adenomatous polyp; negative colonoscopy in 2009   Hyperlipidemia    Seizure (HCC) 01/2022   Stroke [facility])     PAST SURGICAL HISTORY: Past Surgical History:  Procedure Laterality Date   CATARACT EXTRACTION W/PHACO Right 03/23/2014   Procedure: CATARACT EXTRACTION PHACO AND INTRAOCULAR LENS PLACEMENT (IOC);  Surgeon: Susa Simmonds, MD;  Location: [facility];  Service: Ophthalmology;  Laterality: Right;  CDE:2.83   CATARACT EXTRACTION W/PHACO Left 07/27/2014   Procedure: CATARACT EXTRACTION PHACO AND INTRAOCULAR LENS PLACEMENT LEFT EYE CDE=6.97;  Surgeon: Susa Simmonds, MD;  Location: [facility];  Service: Ophthalmology;  Laterality: Left;   COLONOSCOPY  2009   negative; prior study with adenomatous polyp   CYSTOSCOPY WITH RETROGRADE PYELOGRAM, URETEROSCOPY  AND STENT PLACEMENT Left 04/27/2022   Procedure: CYSTOSCOPY WITH RETROGRADE PYELOGRAM, URETEROSCOPY AND STENT PLACEMENT;  Surgeon: Malen Gauze, MD;  Location: [facility];  Service: Urology;  Laterality: Left;   CYSTOSCOPY/URETEROSCOPY/HOLMIUM LASER/STENT PLACEMENT Right 08/21/2016   Procedure: CYSTOSCOPY/URETEROSCOPY/RETROGRADE PYELOGRAM//STENT PLACEMENT;  Surgeon: Hildred Laser, MD;  Location: [facility];  Service: Urology;  Laterality: Right;   EYE SURGERY     HOLMIUM LASER APPLICATION Left 04/27/2022   Procedure: HOLMIUM LASER APPLICATION;  Surgeon: Malen Gauze, MD;  Location: [facility];  Service: Urology;  Laterality: Left;   INTRAOPERATIVE TRANSTHORACIC ECHOCARDIOGRAM N/A 05/31/2021   Procedure: INTRAOPERATIVE TRANSTHORACIC ECHOCARDIOGRAM;  Surgeon: Tonny Bollman, MD;  Location[facility] OR;  Service: Open Heart Surgery;  Laterality: N/A;   RIGHT/LEFT HEART CATH AND CORONARY ANGIOGRAPHY N/A 02/04/2021   Procedure: RIGHT/LEFT HEART CATH AND CORONARY ANGIOGRAPHY;  Surgeon: Tonny Bollman, MD;  Location: [facility] INVASIVE CV LAB;  Service: Cardiovascular;  Laterality: N/A;   TRANSCATHETER  AORTIC VALVE REPLACEMENT, TRANSFEMORAL N/A 05/31/2021   Procedure: Transcatheter Aortic Valve Replacement 23mm, Transfemoral;  Surgeon: Tonny Bollman, MD;  Location: [facility];  Service: Open Heart Surgery;  Laterality: N/A;  Percutaneous    FAMILY HISTORY: Family History  Problem Relation Age of Onset   Cancer Father    Arrhythmia Sister        Atrial fibrillation    SOCIAL HISTORY: Social History   Socioeconomic History   Marital status: Married    Spouse name: Not on file   Number of children: 2   Years of education: Not on file   Highest education level: Not on file  Occupational History   Occupation: Retired  Tobacco Use   Smoking status: Never    Passive exposure: Never   Smokeless tobacco: Never  Vaping Use   Vaping Use: Never used  Substance and Sexual Activity   Alcohol use: No   Drug use: No   Sexual activity: Yes  Other Topics Concern   Not on file  Social History Narrative   Married with 2 children   No regular exercise   Social Determinants of Health   Financial Resource Strain: Not on file  Food Insecurity: Not on file  Transportation Needs: Not on file  Physical Activity: Not on file  Stress: Not on file  Social Connections: Not on file  Intimate Partner Violence: Not on file    PHYSICAL EXAM  GENERAL EXAM/CONSTITUTIONAL: Vitals:  Vitals:   07/11/22 1106  BP: (!) 148/70  Pulse: 72  Weight: 153 lb 8 oz (69.6 kg)  Height: 5\' 2"  (1.575 m)   Body mass index is 28.08 kg/m. Wt Readings from Last 3 Encounters:  07/11/22 153 lb 8 oz (69.6 kg)  06/05/22 160 lb 15 oz (73 kg)  05/22/22 161 lb 6.4 oz (73.2 kg)   Patient is in no distress; well developed, nourished and groomed; neck is supple  MUSCULOSKELETAL: Gait, strength, tone, movements noted in Neurologic exam below  NEUROLOGIC: MENTAL STATUS:      No data to display            07/11/2022   11:13 AM  Montreal Cognitive Assessment   Visuospatial/ Executive (0/5) 2  Naming (0/3)  3  Attention: Read list of digits (0/2) 2  Attention: Read list of letters (0/1) 1  Attention: Serial 7 subtraction starting at 100 (0/3) 1  Language: Repeat phrase (0/2) 1  Language : Fluency (0/1) 0  Abstraction (0/2) 2  Delayed Recall (0/5) 0  Orientation (0/6)  1  Total 13    CRANIAL NERVE:  2nd, 3rd, 4th, 6th - Visual fields full to confrontation, extraocular muscles intact, no nystagmus 5th - facial sensation symmetric 7th - facial strength symmetric 8th - hearing intact 9th - palate elevates symmetrically, uvula midline 11th - shoulder shrug symmetric 12th - tongue protrusion midline  MOTOR:  normal bulk and tone, full strength in the BUE, BLE  SENSORY:  normal and symmetric to light touch  COORDINATION:  finger-nose-finger, fine finger movements normal  GAIT/STATION:  normal   DIAGNOSTIC DATA (LABS, IMAGING, TESTING) - I reviewed patient records, labs, notes, testing and imaging myself where available.  Lab Results  Component Value Date   WBC 8.5 06/05/2022   HGB 12.8 06/05/2022   HCT 39.9 06/05/2022   MCV 91.1 06/05/2022   PLT 161 06/05/2022      Component Value Date/Time   NA 136 06/05/2022 0949   NA 144 01/18/2021 1655   K 3.6 06/05/2022 0949   CL 105 06/05/2022 0949   CO2 26 06/05/2022 0949   GLUCOSE 146 (H) 06/05/2022 0949   BUN 11 06/05/2022 0949   BUN 10 01/18/2021 1655   CREATININE 0.86 06/05/2022 0949   CALCIUM 8.8 (L) 06/05/2022 0949   PROT 7.2 06/05/2022 0949   ALBUMIN 3.7 06/05/2022 0949   AST 28 06/05/2022 0949   ALT 18 06/05/2022 0949   ALKPHOS 41 06/05/2022 0949   BILITOT 0.5 06/05/2022 0949   GFRNONAA >60 06/05/2022 0949   GFRAA >60 11/28/2017 1127   Lab Results  Component Value Date   CHOL 183 02/11/2022   HDL 58 02/11/2022   LDLCALC 112 (H) 02/11/2022   TRIG 64 02/11/2022   Lab Results  Component Value Date   HGBA1C 6.4 (H) 04/25/2022   No results found for: "VITAMINB12" Lab Results  Component Value Date   TSH  2.32 03/24/2008    MRI Brain 02/11/2022 No acute intracranial pathology. No acute hemorrhage or infarct identified.   Routine EEG 02/11/2022 Normal EEGs, however, do not rule out epilepsy.    I personally reviewed brain Images and previous EEG reports.   ASSESSMENT AND PLAN  89 y.o. year old female  with history of atrial fibrillation, hypertension, hyperlipidemia, diabetes, previous stroke in December 2022 who is presenting for follow up for her seizure and memory concerns.  In terms of the seizures, advised patient to continue with Keppra 500 mg twice daily and to increase her fluid intake. When he comes to the memory concerns, she was noted on exam to be very repetitive. She scored a 13 out of 30 on the MoCA indicative of impairment.  I will start by getting her ATN profile to look for Alzheimer disease biomarker.  I will contact the patient to go over the results.  We also discussed medication options, they agreed to start with Aricept 5 mg nightly.  Side effects of the medication including diarrhea, vivid dreams and dizziness discussed with patient and husband.  I will contact them to go over the result.  I will see him in 1 year for follow-up.  Again discussed importance of hydration.       1. Mild late onset Alzheimer's dementia without behavioral disturbance, psychotic disturbance, mood disturbance, or anxiety (HCC)   2. Seizure (HCC)      Patient Instructions  Continue with Keppra 500 mg twice daily Start Aricept 5 mg nightly, side effect from medication including diarrhea, vivid dreams and dizziness discussed with patient ATN profile to look for  Alzheimer disease biomarker Follow-up in 1 year or sooner if worse.   There are well-accepted and sensible ways to reduce risk for Alzheimers disease and other degenerative brain disorders .  Exercise Daily Walk A daily 20 minute walk should be part of your routine. Disease related apathy can be a significant roadblock to exercise  and the only way to overcome this is to make it a daily routine and perhaps have a reward at the end (something your loved one loves to eat or drink perhaps) or a personal trainer coming to the home can also be very useful. Most importantly, the patient is much more likely to exercise if the caregiver / spouse does it with him/her. In general a structured, repetitive schedule is best.  General Health: Any diseases which effect your body will effect your brain such as a pneumonia, urinary infection, blood clot, heart attack or stroke. Keep contact with your primary care doctor for regular follow ups.  Sleep. A good nights sleep is healthy for the brain. Seven hours is recommended. If you have insomnia or poor sleep habits we can give you some instructions. If you have sleep apnea wear your mask.  Diet: Eating a heart healthy diet is also a good idea; fish and poultry instead of red meat, nuts (mostly non-peanuts), vegetables, fruits, olive oil or canola oil (instead of butter), minimal salt (use other spices to flavor foods), whole grain rice, bread, cereal and pasta and wine in moderation.Research is now showing that the MIND diet, which is a combination of The Mediterranean diet and the DASH diet, is beneficial for cognitive processing and longevity. Information about this diet can be found in The MIND Diet, a book by Alonna Minium, MS, RDN, and online at [employer]  Finances, Power of [address] and Advance Directives: You should consider putting legal safeguards in place with regard to financial and medical decision making. While the spouse always has power of attorney for medical and financial issues in the absence of any form, you should consider what you want in case the spouse / caregiver is no longer around or capable of making decisions.   Per [facility] statutes, patients with seizures are not allowed to drive until they have been seizure-free for six  months.  Other recommendations include using caution when using heavy equipment or power tools. Avoid working on ladders or at heights. Take showers instead of baths.  Do not swim alone.  Ensure the water temperature is not too high on the home water heater. Do not go swimming alone. Do not lock yourself in a room alone (i.e. bathroom). When caring for infants or small children, sit down when holding, feeding, or changing them to minimize risk of injury to the child in the event you have a seizure. Maintain good sleep hygiene. Avoid alcohol.  Also recommend adequate sleep, hydration, good diet and minimize stress.   During the Seizure  - First, ensure adequate ventilation and place patients on the floor on their left side  Loosen clothing around the neck and ensure the airway is patent. If the patient is clenching the teeth, do not force the mouth open with any object as this can cause severe damage - Remove all items from the surrounding that can be hazardous. The patient may be oblivious to what's happening and may not even know what he or she is doing. If the patient is confused and wandering, either gently guide him/her away and block access to outside areas - Reassure  the individual and be comforting - Call 911. In most cases, the seizure ends before EMS arrives. However, there are cases when seizures may last over 3 to 5 minutes. Or the individual may have developed breathing difficulties or severe injuries. If a pregnant patient or a person with diabetes develops a seizure, it is prudent to call an ambulance. - Finally, if the patient does not regain full consciousness, then call EMS. Most patients will remain confused for about 45 to 90 minutes after a seizure, so you must use judgment in calling for help. - Avoid restraints but make sure the patient is in a bed with padded side rails - Place the individual in a lateral position with the neck slightly flexed; this will help the saliva drain from  the mouth and prevent the tongue from falling backward - Remove all nearby furniture and other hazards from the area - Provide verbal assurance as the individual is regaining consciousness - Provide the patient with privacy if possible - Call for help and start treatment as ordered by the caregiver   After the Seizure (Postictal Stage)  After a seizure, most patients experience confusion, fatigue, muscle pain and/or a headache. Thus, one should permit the individual to sleep. For the next few days, reassurance is essential. Being calm and helping reorient the person is also of importance.  Most seizures are painless and end spontaneously. Seizures are not harmful to others but can lead to complications such as stress on the lungs, brain and the heart. Individuals with prior lung problems may develop labored breathing and respiratory distress.     Orders Placed This Encounter  Procedures   ATN PROFILE    Meds ordered this encounter  Medications   DISCONTD: donepezil (ARICEPT) 5 MG tablet    Sig: Take 1 tablet (5 mg total) by mouth at bedtime.    Dispense:  30 tablet    Refill:  6   donepezil (ARICEPT) 5 MG tablet    Sig: Take 1 tablet (5 mg total) by mouth at bedtime.    Dispense:  30 tablet    Refill:  6    Return in about 1 year (around 07/11/2023).    Windell Norfolk, MD 07/11/2022, 5:15 PM  [facility] [address] [place], [place] [address] [phone]

## 2022-07-13 DIAGNOSIS — E1122 Type 2 diabetes mellitus with diabetic chronic kidney disease: Secondary | ICD-10-CM | POA: Diagnosis not present

## 2022-07-13 DIAGNOSIS — M545 Low back pain, unspecified: Secondary | ICD-10-CM | POA: Diagnosis not present

## 2022-07-13 DIAGNOSIS — I700 Atherosclerosis of aorta: Secondary | ICD-10-CM | POA: Diagnosis not present

## 2022-07-13 DIAGNOSIS — R7309 Other abnormal glucose: Secondary | ICD-10-CM | POA: Diagnosis not present

## 2022-07-13 LAB — ATN PROFILE

## 2022-07-14 LAB — ATN PROFILE
A -- Beta-amyloid 42/40 Ratio: 0.105 (ref >0.102)
Beta-amyloid 40: 171.45 pg/mL
N -- NfL, Plasma: 4.68 pg/mL (ref 0.00–11.55)

## 2022-07-27 ENCOUNTER — Ambulatory Visit: Payer: No Typology Code available for payment source | Admitting: Neurology

## 2022-08-04 DIAGNOSIS — Z87440 Personal history of urinary (tract) infections: Secondary | ICD-10-CM | POA: Insufficient documentation

## 2022-08-04 NOTE — Progress Notes (Unsigned)
History of Present Illness: Anne Wolf is a 89 y.o. female who presents today for follow up visit at [facility] [place]. - GU History: 1. Kidney stones.  At last visit with Dr. Ronne Binning on 06/28/2022: - CT from 06/16/2022 showed left lower pole calculus and moderate stool burden.  - UA showed CaOx crystals. - Negative urine culture. - Advised patient to "increase water intake and decrease sundrop consumptions"  Since last visit: Renal ultrasound on 07/07/2022 showed:  1. 5 mm nonobstructive stone in the left kidney. 2. The bladder is poorly evaluated due to lack of distention. 3. The right kidney demonstrates no significant abnormalities.   Today: ***KUB today: Awaiting radiology read; *** ***RUS today: Awaiting radiology read; ***   She {Actions; denies-reports:120008} recent episode of stone pain / passage. She {Actions; denies-reports:120008} acute flank pain / abdominal pain. She {Actions; denies-reports:120008} fevers.  She {Actions; denies-reports:120008} nausea/ vomiting.  She urinates *** times per day. She {Actions; denies-reports:120008} urgency. She {Actions; denies-reports:120008} dysuria. She {Actions; denies-reports:120008} gross hematuria. She {Actions; denies-reports:120008} the need to strain to void. She {Actions; denies-reports:120008} sensations of incomplete emptying.  Fall Screening: Do you usually have a device to assist in your mobility? {yes/no:20286}  ***cane / ***walker / ***wheelchair  Medications: Current Outpatient Medications  Medication Sig Dispense Refill   alendronate (FOSAMAX) 70 MG tablet Take 70 mg by mouth every Wednesday.     apixaban (ELIQUIS) 5 MG TABS tablet Take 1 tablet (5 mg total) by mouth 2 (two) times daily. 60 tablet 11   Cholecalciferol (VITAMIN D3) 125 MCG (5000 UT) capsule Take 5,000 Units by mouth daily.     donepezil (ARICEPT) 5 MG tablet Take 1 tablet (5 mg total) by mouth at bedtime. 30 tablet 6    levETIRAcetam (KEPPRA) 500 MG tablet Take 1 tablet (500 mg total) by mouth 2 (two) times daily. 180 tablet 3   lisinopril (PRINIVIL,ZESTRIL) 10 MG tablet Take 10 mg by mouth at bedtime.     metFORMIN (GLUCOPHAGE) 500 MG tablet Take 500 mg by mouth 2 (two) times daily with a meal.     metoprolol succinate (TOPROL-XL) 25 MG 24 hr tablet Take 25 mg by mouth at bedtime. (Patient not taking: Reported on 06/14/2022)     mirabegron ER (MYRBETRIQ) 25 MG TB24 tablet Take 1 tablet (25 mg total) by mouth daily. (Patient not taking: Reported on 04/24/2022) 30 tablet 0   rosuvastatin (CRESTOR) 20 MG tablet Take 1 tablet (20 mg total) by mouth daily. 30 tablet 1   tamsulosin (FLOMAX) 0.4 MG CAPS capsule Take 1 capsule (0.4 mg total) by mouth daily after supper. (Patient not taking: Reported on 05/22/2022) 30 capsule 0   No current facility-administered medications for this visit.    Allergies: Allergies  Allergen Reactions   Cat Hair Extract Other (See Comments)    Red eyes and Sneezing Also allergic to Dogs    Past Medical History:  Diagnosis Date   Aortic stenosis    mild; mild MR; slightly increased pulmonary artery pressure with mild RVH; normal LV-2010   Arthritis    Atrial fibrillation (HCC)    chronic anticoagulation; adequate HR control on minimal AV nodal blocking medication    Cancer (HCC)    skin cancers removed from face   CHF (congestive heart failure) (HCC)    Diabetes mellitus without complication (HCC)    2   Dyspnea    Dysrhythmia    Fasting hyperglycemia    + microalbuminuria; A1c of  6.5% in 11/2010   Hematochezia 01/2009   01/2009-presumed ischemic colitis; history of diverticulosis   History of kidney stones    Hx of adenomatous colonic polyps 2005   adenomatous polyp; negative colonoscopy in 2009   Hyperlipidemia    Seizure (HCC) 01/2022   Stroke [facility])    Past Surgical History:  Procedure Laterality Date   CATARACT EXTRACTION W/PHACO Right 03/23/2014   Procedure:  CATARACT EXTRACTION PHACO AND INTRAOCULAR LENS PLACEMENT (IOC);  Surgeon: Susa Simmonds, MD;  Location: [facility];  Service: Ophthalmology;  Laterality: Right;  CDE:2.83   CATARACT EXTRACTION W/PHACO Left 07/27/2014   Procedure: CATARACT EXTRACTION PHACO AND INTRAOCULAR LENS PLACEMENT LEFT EYE CDE=6.97;  Surgeon: Susa Simmonds, MD;  Location: [facility];  Service: Ophthalmology;  Laterality: Left;   COLONOSCOPY  2009   negative; prior study with adenomatous polyp   CYSTOSCOPY WITH RETROGRADE PYELOGRAM, URETEROSCOPY AND STENT PLACEMENT Left 04/27/2022   Procedure: CYSTOSCOPY WITH RETROGRADE PYELOGRAM, URETEROSCOPY AND STENT PLACEMENT;  Surgeon: Malen Gauze, MD;  Location: [facility];  Service: Urology;  Laterality: Left;   CYSTOSCOPY/URETEROSCOPY/HOLMIUM LASER/STENT PLACEMENT Right 08/21/2016   Procedure: CYSTOSCOPY/URETEROSCOPY/RETROGRADE PYELOGRAM//STENT PLACEMENT;  Surgeon: Hildred Laser, MD;  Location: [facility];  Service: Urology;  Laterality: Right;   EYE SURGERY     HOLMIUM LASER APPLICATION Left 04/27/2022   Procedure: HOLMIUM LASER APPLICATION;  Surgeon: Malen Gauze, MD;  Location: [facility];  Service: Urology;  Laterality: Left;   INTRAOPERATIVE TRANSTHORACIC ECHOCARDIOGRAM N/A 05/31/2021   Procedure: INTRAOPERATIVE TRANSTHORACIC ECHOCARDIOGRAM;  Surgeon: Tonny Bollman, MD;  Location: [facility] OR;  Service: Open Heart Surgery;  Laterality: N/A;   RIGHT/LEFT HEART CATH AND CORONARY ANGIOGRAPHY N/A 02/04/2021   Procedure: RIGHT/LEFT HEART CATH AND CORONARY ANGIOGRAPHY;  Surgeon: Tonny Bollman, MD;  Location: [facility] INVASIVE CV LAB;  Service: Cardiovascular;  Laterality: N/A;   TRANSCATHETER AORTIC VALVE REPLACEMENT, TRANSFEMORAL N/A 05/31/2021   Procedure: Transcatheter Aortic Valve Replacement 23mm, Transfemoral;  Surgeon: Tonny Bollman, MD;  Location: [facility] OR;  Service: Open Heart Surgery;  Laterality: N/A;  Percutaneous   Family History  Problem Relation Age of Onset   Cancer Father     Arrhythmia Sister        Atrial fibrillation   Social History   Socioeconomic History   Marital status: Married    Spouse name: Not on file   Number of children: 2   Years of education: Not on file   Highest education level: Not on file  Occupational History   Occupation: Retired  Tobacco Use   Smoking status: Never    Passive exposure: Never   Smokeless tobacco: Never  Vaping Use   Vaping Use: Never used  Substance and Sexual Activity   Alcohol use: No   Drug use: No   Sexual activity: Yes  Other Topics Concern   Not on file  Social History Narrative   Married with 2 children   No regular exercise   Social Determinants of Health   Financial Resource Strain: Not on file  Food Insecurity: Not on file  Transportation Needs: Not on file  Physical Activity: Not on file  Stress: Not on file  Social Connections: Not on file  Intimate Partner Violence: Not on file    SUBJECTIVE  Review of Systems Constitutional: Patient ***denies any unintentional weight loss or change in strength lntegumentary: Patient ***denies any rashes or pruritus Eyes: Patient denies ***dry eyes ENT: Patient ***denies dry mouth Cardiovascular: Patient ***denies chest pain or syncope Respiratory: Patient ***denies  shortness of breath Gastrointestinal: Patient ***denies nausea, vomiting, constipation, or diarrhea Musculoskeletal: Patient ***denies muscle cramps or weakness Neurologic: Patient ***denies convulsions or seizures Psychiatric: Patient ***denies memory problems Allergic/Immunologic: Patient ***denies recent allergic reaction(s) Hematologic/Lymphatic: Patient denies bleeding tendencies Endocrine: Patient ***denies heat/cold intolerance  GU: As per HPI.  OBJECTIVE There were no vitals filed for this visit. There is no height or weight on file to calculate BMI.  Physical Examination  Constitutional: ***No obvious distress; patient is ***non-toxic appearing  Cardiovascular: ***No  visible lower extremity edema.  Respiratory: The patient does ***not have audible wheezing/stridor; respirations do ***not appear labored  Gastrointestinal: Abdomen ***non-distended Musculoskeletal: ***Normal ROM of UEs  Skin: ***No obvious rashes/open sores  Neurologic: CN 2-12 grossly ***intact Psychiatric: Answered questions ***appropriately with ***normal affect  Hematologic/Lymphatic/Immunologic: ***No obvious bruises or sites of spontaneous bleeding  UA: {Desc; negative/positive:13464} *** WBC/hpf, *** RBC/hpf, bacteria (***) *** nitrites, *** leukocytes, *** blood PVR: *** ml  ASSESSMENT No diagnosis found.  ***We reviewed recent imaging results; ***awaiting radiology results, appears to have ***no acute findings.  ***For stone prevention: Advised adequate hydration and we discussed option to consider low oxalate diet given that calcium oxalate is the most common type of stone. Handout provided about stone prevention diet.  ***For recurrent stone formers: We discussed option to proceed with 24 hour urinalysis ([facility]) for metabolic evaluation, which may help with targeted recommendations for dietary I medication therapies for stone prevention. Patient elected to ***proceed/ ***hold off.  Will plan to follow up in ***6 months / ***1 year with ***KUB ***RUS for stone surveillance or sooner if needed.  Pt verbalized understanding and agreement. All questions were answered.  PLAN Advised the following: ***Maintain adequate fluid intake. ***Low oxalate diet. No follow-ups on file.  No orders of the defined types were placed in this encounter.   It has been explained that the patient is to follow regularly with their PCP in addition to all other providers involved in their care and to follow instructions provided by these respective offices. Patient advised to contact urology clinic if any urologic-pertaining questions, concerns, new symptoms or problems arise in the interim  period.  There are no Patient Instructions on file for this visit.  Electronically signed by:  Donnita Falls, MSN, FNP-C, CUNP 08/04/2022 2:57 PM

## 2022-08-08 ENCOUNTER — Ambulatory Visit (INDEPENDENT_AMBULATORY_CARE_PROVIDER_SITE_OTHER): Payer: No Typology Code available for payment source | Admitting: Urology

## 2022-08-08 ENCOUNTER — Encounter: Payer: Self-pay | Admitting: Urology

## 2022-08-08 VITALS — BP 151/84 | HR 78 | Temp 98.8°F

## 2022-08-08 DIAGNOSIS — N200 Calculus of kidney: Secondary | ICD-10-CM | POA: Diagnosis not present

## 2022-08-08 DIAGNOSIS — R3129 Other microscopic hematuria: Secondary | ICD-10-CM | POA: Diagnosis not present

## 2022-08-08 DIAGNOSIS — Z87440 Personal history of urinary (tract) infections: Secondary | ICD-10-CM

## 2022-08-08 DIAGNOSIS — R82998 Other abnormal findings in urine: Secondary | ICD-10-CM | POA: Diagnosis not present

## 2022-08-08 LAB — BLADDER SCAN AMB NON-IMAGING

## 2022-08-09 LAB — URINALYSIS, ROUTINE W REFLEX MICROSCOPIC
Bilirubin, UA: NEGATIVE
Glucose, UA: NEGATIVE
Ketones, UA: NEGATIVE
Nitrite, UA: NEGATIVE
Protein,UA: NEGATIVE
Specific Gravity, UA: 1.03 (ref 1.005–1.030)
Urobilinogen, Ur: 1 mg/dL (ref 0.2–1.0)
pH, UA: 5.5 (ref 5.0–7.5)

## 2022-08-09 LAB — MICROSCOPIC EXAMINATION

## 2022-08-15 ENCOUNTER — Emergency Department (HOSPITAL_COMMUNITY): Payer: No Typology Code available for payment source

## 2022-08-15 ENCOUNTER — Encounter (HOSPITAL_COMMUNITY): Payer: Self-pay | Admitting: *Deleted

## 2022-08-15 ENCOUNTER — Other Ambulatory Visit: Payer: Self-pay

## 2022-08-15 ENCOUNTER — Inpatient Hospital Stay (HOSPITAL_COMMUNITY)
Admission: EM | Admit: 2022-08-15 | Discharge: 2022-08-22 | DRG: 100 | Disposition: A | Discharge status: Home Health | Payer: No Typology Code available for payment source | Attending: Internal Medicine | Admitting: Internal Medicine

## 2022-08-15 DIAGNOSIS — E782 Mixed hyperlipidemia: Secondary | ICD-10-CM | POA: Diagnosis present

## 2022-08-15 DIAGNOSIS — I350 Nonrheumatic aortic (valve) stenosis: Secondary | ICD-10-CM | POA: Diagnosis present

## 2022-08-15 DIAGNOSIS — J9811 Atelectasis: Secondary | ICD-10-CM | POA: Diagnosis not present

## 2022-08-15 DIAGNOSIS — Z7984 Long term (current) use of oral hypoglycemic drugs: Secondary | ICD-10-CM

## 2022-08-15 DIAGNOSIS — E669 Obesity, unspecified: Secondary | ICD-10-CM | POA: Diagnosis not present

## 2022-08-15 DIAGNOSIS — J9601 Acute respiratory failure with hypoxia: Secondary | ICD-10-CM | POA: Diagnosis not present

## 2022-08-15 DIAGNOSIS — Z515 Encounter for palliative care: Secondary | ICD-10-CM

## 2022-08-15 DIAGNOSIS — Z87442 Personal history of urinary calculi: Secondary | ICD-10-CM

## 2022-08-15 DIAGNOSIS — R1310 Dysphagia, unspecified: Secondary | ICD-10-CM | POA: Diagnosis not present

## 2022-08-15 DIAGNOSIS — R58 Hemorrhage, not elsewhere classified: Secondary | ICD-10-CM | POA: Diagnosis not present

## 2022-08-15 DIAGNOSIS — Z85828 Personal history of other malignant neoplasm of skin: Secondary | ICD-10-CM

## 2022-08-15 DIAGNOSIS — Z7983 Long term (current) use of bisphosphonates: Secondary | ICD-10-CM

## 2022-08-15 DIAGNOSIS — I6381 Other cerebral infarction due to occlusion or stenosis of small artery: Secondary | ICD-10-CM | POA: Diagnosis not present

## 2022-08-15 DIAGNOSIS — J90 Pleural effusion, not elsewhere classified: Secondary | ICD-10-CM | POA: Diagnosis not present

## 2022-08-15 DIAGNOSIS — E119 Type 2 diabetes mellitus without complications: Secondary | ICD-10-CM | POA: Diagnosis not present

## 2022-08-15 DIAGNOSIS — Z9841 Cataract extraction status, right eye: Secondary | ICD-10-CM

## 2022-08-15 DIAGNOSIS — Z961 Presence of intraocular lens: Secondary | ICD-10-CM | POA: Diagnosis not present

## 2022-08-15 DIAGNOSIS — I110 Hypertensive heart disease with heart failure: Secondary | ICD-10-CM | POA: Diagnosis not present

## 2022-08-15 DIAGNOSIS — R0602 Shortness of breath: Secondary | ICD-10-CM | POA: Diagnosis not present

## 2022-08-15 DIAGNOSIS — E876 Hypokalemia: Secondary | ICD-10-CM | POA: Diagnosis not present

## 2022-08-15 DIAGNOSIS — J690 Pneumonitis due to inhalation of food and vomit: Secondary | ICD-10-CM | POA: Diagnosis present

## 2022-08-15 DIAGNOSIS — F0390 Unspecified dementia without behavioral disturbance: Secondary | ICD-10-CM | POA: Diagnosis not present

## 2022-08-15 DIAGNOSIS — I6782 Cerebral ischemia: Secondary | ICD-10-CM | POA: Diagnosis not present

## 2022-08-15 DIAGNOSIS — Z7189 Other specified counseling: Secondary | ICD-10-CM | POA: Diagnosis not present

## 2022-08-15 DIAGNOSIS — I10 Essential (primary) hypertension: Secondary | ICD-10-CM | POA: Diagnosis present

## 2022-08-15 DIAGNOSIS — I482 Chronic atrial fibrillation, unspecified: Secondary | ICD-10-CM | POA: Diagnosis not present

## 2022-08-15 DIAGNOSIS — I959 Hypotension, unspecified: Secondary | ICD-10-CM | POA: Diagnosis not present

## 2022-08-15 DIAGNOSIS — Z79899 Other long term (current) drug therapy: Secondary | ICD-10-CM

## 2022-08-15 DIAGNOSIS — R627 Adult failure to thrive: Secondary | ICD-10-CM | POA: Diagnosis not present

## 2022-08-15 DIAGNOSIS — Z6831 Body mass index (BMI) 31.0-31.9, adult: Secondary | ICD-10-CM

## 2022-08-15 DIAGNOSIS — G40901 Epilepsy, unspecified, not intractable, with status epilepticus: Secondary | ICD-10-CM | POA: Diagnosis not present

## 2022-08-15 DIAGNOSIS — R569 Unspecified convulsions: Secondary | ICD-10-CM | POA: Diagnosis not present

## 2022-08-15 DIAGNOSIS — Z4682 Encounter for fitting and adjustment of non-vascular catheter: Secondary | ICD-10-CM | POA: Diagnosis not present

## 2022-08-15 DIAGNOSIS — Z66 Do not resuscitate: Secondary | ICD-10-CM | POA: Diagnosis present

## 2022-08-15 DIAGNOSIS — G309 Alzheimer's disease, unspecified: Secondary | ICD-10-CM | POA: Diagnosis not present

## 2022-08-15 DIAGNOSIS — Z95828 Presence of other vascular implants and grafts: Secondary | ICD-10-CM | POA: Diagnosis not present

## 2022-08-15 DIAGNOSIS — G9389 Other specified disorders of brain: Secondary | ICD-10-CM | POA: Diagnosis not present

## 2022-08-15 DIAGNOSIS — I5032 Chronic diastolic (congestive) heart failure: Secondary | ICD-10-CM | POA: Diagnosis present

## 2022-08-15 DIAGNOSIS — E872 Acidosis, unspecified: Secondary | ICD-10-CM | POA: Diagnosis not present

## 2022-08-15 DIAGNOSIS — J3081 Allergic rhinitis due to animal (cat) (dog) hair and dander: Secondary | ICD-10-CM | POA: Diagnosis not present

## 2022-08-15 DIAGNOSIS — R9089 Other abnormal findings on diagnostic imaging of central nervous system: Secondary | ICD-10-CM | POA: Diagnosis not present

## 2022-08-15 DIAGNOSIS — Z952 Presence of prosthetic heart valve: Secondary | ICD-10-CM

## 2022-08-15 DIAGNOSIS — Z86010 Personal history of colonic polyps: Secondary | ICD-10-CM

## 2022-08-15 DIAGNOSIS — I639 Cerebral infarction, unspecified: Secondary | ICD-10-CM | POA: Diagnosis not present

## 2022-08-15 DIAGNOSIS — R000 Tachycardia, unspecified: Secondary | ICD-10-CM | POA: Diagnosis not present

## 2022-08-15 DIAGNOSIS — B449 Aspergillosis, unspecified: Secondary | ICD-10-CM

## 2022-08-15 DIAGNOSIS — R918 Other nonspecific abnormal finding of lung field: Secondary | ICD-10-CM | POA: Diagnosis not present

## 2022-08-15 DIAGNOSIS — Z7901 Long term (current) use of anticoagulants: Secondary | ICD-10-CM

## 2022-08-15 DIAGNOSIS — Z9842 Cataract extraction status, left eye: Secondary | ICD-10-CM

## 2022-08-15 DIAGNOSIS — Z8673 Personal history of transient ischemic attack (TIA), and cerebral infarction without residual deficits: Secondary | ICD-10-CM

## 2022-08-15 DIAGNOSIS — F0280 Dementia in other diseases classified elsewhere without behavioral disturbance: Secondary | ICD-10-CM | POA: Diagnosis not present

## 2022-08-15 LAB — BLOOD GAS, ARTERIAL
Acid-base deficit: 1.7 mmol/L (ref 0.0–2.0)
Bicarbonate: 23.7 mmol/L (ref 20.0–28.0)
Drawn by: 35849
O2 Saturation: 99.8 %
Patient temperature: 37.8
pCO2 arterial: 43 mmHg (ref 32–48)
pH, Arterial: 7.35 (ref 7.35–7.45)
pO2, Arterial: 126 mmHg — ABNORMAL HIGH (ref 83–108)

## 2022-08-15 LAB — COMPREHENSIVE METABOLIC PANEL
ALT: 16 U/L (ref 0–44)
AST: 39 U/L (ref 15–41)
Albumin: 4.2 g/dL (ref 3.5–5.0)
Alkaline Phosphatase: 49 U/L (ref 38–126)
Anion gap: 26 — ABNORMAL HIGH (ref 5–15)
BUN: 13 mg/dL (ref 8–23)
CO2: 12 mmol/L — ABNORMAL LOW (ref 22–32)
Calcium: 9.6 mg/dL (ref 8.9–10.3)
Chloride: 102 mmol/L (ref 98–111)
Creatinine, Ser: 0.99 mg/dL (ref 0.44–1.00)
GFR, Estimated: 56 mL/min — ABNORMAL LOW (ref >60)
Glucose, Bld: 175 mg/dL — ABNORMAL HIGH (ref 70–99)
Potassium: 3.5 mmol/L (ref 3.5–5.1)
Sodium: 140 mmol/L (ref 135–145)
Total Bilirubin: 1.1 mg/dL (ref 0.3–1.2)
Total Protein: 8.1 g/dL (ref 6.5–8.1)

## 2022-08-15 LAB — CBC WITH DIFFERENTIAL/PLATELET
Abs Immature Granulocytes: 0 10*3/uL (ref 0.00–0.07)
Basophils Absolute: 0 10*3/uL (ref 0.0–0.1)
Basophils Relative: 0 %
Eosinophils Absolute: 0.3 10*3/uL (ref 0.0–0.5)
Eosinophils Relative: 2 %
HCT: 44.7 % (ref 36.0–46.0)
Hemoglobin: 14.1 g/dL (ref 12.0–15.0)
Lymphocytes Relative: 50 %
Lymphs Abs: 6.6 10*3/uL — ABNORMAL HIGH (ref 0.7–4.0)
MCH: 29.4 pg (ref 26.0–34.0)
MCHC: 31.5 g/dL (ref 30.0–36.0)
MCV: 93.3 fL (ref 80.0–100.0)
Monocytes Absolute: 0.9 10*3/uL (ref 0.1–1.0)
Monocytes Relative: 7 %
Neutro Abs: 5.4 10*3/uL (ref 1.7–7.7)
Neutrophils Relative %: 41 %
Platelets: 187 10*3/uL (ref 150–400)
RBC: 4.79 MIL/uL (ref 3.87–5.11)
RDW: 14.1 % (ref 11.5–15.5)
WBC: 13.1 10*3/uL — ABNORMAL HIGH (ref 4.0–10.5)
nRBC: 0 % (ref 0.0–0.2)

## 2022-08-15 LAB — RAPID URINE DRUG SCREEN, HOSP PERFORMED
Amphetamines: NOT DETECTED
Barbiturates: NOT DETECTED
Benzodiazepines: NOT DETECTED
Cocaine: NOT DETECTED
Opiates: NOT DETECTED
Tetrahydrocannabinol: NOT DETECTED

## 2022-08-15 LAB — URINALYSIS, ROUTINE W REFLEX MICROSCOPIC
Bilirubin Urine: NEGATIVE
Glucose, UA: NEGATIVE mg/dL
Ketones, ur: NEGATIVE mg/dL
Leukocytes,Ua: NEGATIVE
Nitrite: NEGATIVE
Protein, ur: >=300 mg/dL — AB
Specific Gravity, Urine: 1.014 (ref 1.005–1.030)
pH: 5 (ref 5.0–8.0)

## 2022-08-15 LAB — LACTIC ACID, PLASMA
Lactic Acid, Venous: >9 mmol/L (ref 0.5–1.9)
Lactic Acid, Venous: 6.5 mmol/L (ref 0.5–1.9)

## 2022-08-15 LAB — I-STAT CHEM 8, ED
BUN: 11 mg/dL (ref 8–23)
Calcium, Ion: 1.24 mmol/L (ref 1.15–1.40)
Chloride: 105 mmol/L (ref 98–111)
Creatinine, Ser: 0.8 mg/dL (ref 0.44–1.00)
Glucose, Bld: 170 mg/dL — ABNORMAL HIGH (ref 70–99)
HCT: 44 % (ref 36.0–46.0)
Hemoglobin: 15 g/dL (ref 12.0–15.0)
Potassium: 3.4 mmol/L — ABNORMAL LOW (ref 3.5–5.1)
Sodium: 142 mmol/L (ref 135–145)
TCO2: 16 mmol/L — ABNORMAL LOW (ref 22–32)

## 2022-08-15 LAB — CBG MONITORING, ED: Glucose-Capillary: 181 mg/dL — ABNORMAL HIGH (ref 70–99)

## 2022-08-15 LAB — CULTURE, BLOOD (ROUTINE X 2): Special Requests: ADEQUATE

## 2022-08-15 MED ORDER — LORAZEPAM 2 MG/ML IJ SOLN
2.0000 mg | Freq: Once | INTRAMUSCULAR | Status: AC
  Administered 2022-08-15: 2 mg via INTRAVENOUS

## 2022-08-15 MED ORDER — NOREPINEPHRINE 4 MG/250ML-% IV SOLN
0.0000 ug/min | INTRAVENOUS | Status: DC
  Administered 2022-08-15: 2 ug/min via INTRAVENOUS
  Filled 2022-08-15: qty 250

## 2022-08-15 MED ORDER — PROPOFOL 1000 MG/100ML IV EMUL
5.0000 ug/kg/min | INTRAVENOUS | Status: DC
  Administered 2022-08-15: 5 ug/kg/min via INTRAVENOUS
  Administered 2022-08-16: 12 ug/kg/min via INTRAVENOUS
  Filled 2022-08-15 (×2): qty 100

## 2022-08-15 MED ORDER — SODIUM CHLORIDE 0.9 % IV BOLUS
1000.0000 mL | Freq: Once | INTRAVENOUS | Status: AC
  Administered 2022-08-15: 1000 mL via INTRAVENOUS

## 2022-08-15 MED ORDER — SUCCINYLCHOLINE CHLORIDE 200 MG/10ML IV SOSY
PREFILLED_SYRINGE | INTRAVENOUS | Status: AC
  Administered 2022-08-15: 120 mg
  Filled 2022-08-15: qty 10

## 2022-08-15 MED ORDER — LEVETIRACETAM IN NACL 1000 MG/100ML IV SOLN
1000.0000 mg | Freq: Once | INTRAVENOUS | Status: AC
  Administered 2022-08-15: 1000 mg via INTRAVENOUS

## 2022-08-15 MED ORDER — ACETAMINOPHEN 650 MG RE SUPP
650.0000 mg | Freq: Once | RECTAL | Status: AC
  Administered 2022-08-15: 650 mg via RECTAL
  Filled 2022-08-15: qty 1

## 2022-08-15 MED ORDER — SODIUM CHLORIDE 0.9 % IV SOLN
3.0000 g | Freq: Once | INTRAVENOUS | Status: AC
  Administered 2022-08-15: 3 g via INTRAVENOUS
  Filled 2022-08-15: qty 8

## 2022-08-15 MED ORDER — LORAZEPAM 2 MG/ML PO CONC: 2.0000 mg | Freq: Once | ORAL | Status: DC

## 2022-08-15 MED ORDER — ETOMIDATE 2 MG/ML IV SOLN
INTRAVENOUS | Status: AC
  Administered 2022-08-15: 20 mg
  Filled 2022-08-15: qty 20

## 2022-08-15 NOTE — Progress Notes (Signed)
Patient transported to CT and returned to room 1. No complications. Vitals stable throughout. Patient tolerated well. RT will continue to monitor.

## 2022-08-15 NOTE — Progress Notes (Addendum)
08/15/2022  Status epilepticus x 40+ mins requiring intubation CT head neg To [facility] for further workup Will make neuro aware on arrival  Myrla Halsted MD PCCM

## 2022-08-15 NOTE — Progress Notes (Signed)
Took patient back up to 60%, sats went down to 91 and 92%.  Current sat 96%.

## 2022-08-15 NOTE — Progress Notes (Addendum)
Gave report to Tim, RRT at [facility]; pt to be transferred to 4N.  Turning FIO2 down from 100% to 50%, Moved tube from 23 to 21 cm after reading radiology note.

## 2022-08-15 NOTE — Progress Notes (Signed)
[facility] at bedside to move patient to [facility].

## 2022-08-15 NOTE — Progress Notes (Signed)
Patient was clamping down on tube and setting off ventilator.  Bite block placed.

## 2022-08-15 NOTE — ED Triage Notes (Signed)
Pt BIB [facility] for c/o continuous seizing; husband reported to ems that pt was sitting in chair when she started "convulsing"; when ems arrived pt was actively seizing and pt was given versed 5mg  IM  Pt had some bloody secretions due to mouth trauma  Pt arrived actively seizing   1704 etomidate 20mg  1704 succ 120mg   Pt intubated with 7.5 ETT tube, 22cm at lip at 1706 with positive color change

## 2022-08-16 ENCOUNTER — Inpatient Hospital Stay (HOSPITAL_COMMUNITY): Payer: No Typology Code available for payment source

## 2022-08-16 ENCOUNTER — Other Ambulatory Visit: Payer: Self-pay

## 2022-08-16 DIAGNOSIS — G40901 Epilepsy, unspecified, not intractable, with status epilepticus: Secondary | ICD-10-CM | POA: Diagnosis not present

## 2022-08-16 DIAGNOSIS — R918 Other nonspecific abnormal finding of lung field: Secondary | ICD-10-CM | POA: Diagnosis not present

## 2022-08-16 DIAGNOSIS — J9601 Acute respiratory failure with hypoxia: Secondary | ICD-10-CM | POA: Diagnosis not present

## 2022-08-16 DIAGNOSIS — J9811 Atelectasis: Secondary | ICD-10-CM | POA: Diagnosis not present

## 2022-08-16 DIAGNOSIS — J90 Pleural effusion, not elsewhere classified: Secondary | ICD-10-CM | POA: Diagnosis not present

## 2022-08-16 DIAGNOSIS — I639 Cerebral infarction, unspecified: Secondary | ICD-10-CM | POA: Diagnosis not present

## 2022-08-16 LAB — CBC
HCT: 38.3 % (ref 36.0–46.0)
Hemoglobin: 12.5 g/dL (ref 12.0–15.0)
MCH: 28.6 pg (ref 26.0–34.0)
MCHC: 32.6 g/dL (ref 30.0–36.0)
MCV: 87.6 fL (ref 80.0–100.0)
Platelets: 145 10*3/uL — ABNORMAL LOW (ref 150–400)
RBC: 4.37 MIL/uL (ref 3.87–5.11)
RDW: 14.3 % (ref 11.5–15.5)
WBC: 15.3 10*3/uL — ABNORMAL HIGH (ref 4.0–10.5)
nRBC: 0 % (ref 0.0–0.2)

## 2022-08-16 LAB — PHOSPHORUS: Phosphorus: 2.4 mg/dL — ABNORMAL LOW (ref 2.5–4.6)

## 2022-08-16 LAB — BASIC METABOLIC PANEL
Anion gap: 13 (ref 5–15)
BUN: 11 mg/dL (ref 8–23)
CO2: 21 mmol/L — ABNORMAL LOW (ref 22–32)
Calcium: 8.5 mg/dL — ABNORMAL LOW (ref 8.9–10.3)
Chloride: 106 mmol/L (ref 98–111)
Creatinine, Ser: 0.94 mg/dL (ref 0.44–1.00)
GFR, Estimated: 59 mL/min — ABNORMAL LOW (ref >60)
Glucose, Bld: 149 mg/dL — ABNORMAL HIGH (ref 70–99)
Potassium: 2.7 mmol/L — CL (ref 3.5–5.1)
Sodium: 140 mmol/L (ref 135–145)

## 2022-08-16 LAB — GLUCOSE, CAPILLARY
Glucose-Capillary: 133 mg/dL — ABNORMAL HIGH (ref 70–99)
Glucose-Capillary: 161 mg/dL — ABNORMAL HIGH (ref 70–99)
Glucose-Capillary: 144 mg/dL — ABNORMAL HIGH (ref 70–99)
Glucose-Capillary: 148 mg/dL — ABNORMAL HIGH (ref 70–99)
Glucose-Capillary: 131 mg/dL — ABNORMAL HIGH (ref 70–99)
Glucose-Capillary: 98 mg/dL (ref 70–99)

## 2022-08-16 LAB — LACTIC ACID, PLASMA: Lactic Acid, Venous: 3.6 mmol/L (ref 0.5–1.9)

## 2022-08-16 LAB — MRSA NEXT GEN BY PCR, NASAL: MRSA by PCR Next Gen: NOT DETECTED

## 2022-08-16 LAB — MAGNESIUM: Magnesium: 1.7 mg/dL (ref 1.7–2.4)

## 2022-08-16 MED ORDER — SODIUM CHLORIDE 0.9 % IV SOLN
3.0000 g | Freq: Four times a day (QID) | INTRAVENOUS | Status: DC
  Administered 2022-08-16 – 2022-08-19 (×13): 3 g via INTRAVENOUS
  Filled 2022-08-16 (×13): qty 8

## 2022-08-16 MED ORDER — FAMOTIDINE 20 MG PO TABS
20.0000 mg | ORAL_TABLET | ORAL | Status: DC
  Administered 2022-08-16: 20 mg
  Filled 2022-08-16: qty 1

## 2022-08-16 MED ORDER — LACTATED RINGERS IV SOLN: INTRAVENOUS | Status: DC

## 2022-08-16 MED ORDER — CHLORHEXIDINE GLUCONATE CLOTH 2 % EX PADS
6.0000 | MEDICATED_PAD | Freq: Every day | CUTANEOUS | Status: DC
  Administered 2022-08-16 – 2022-08-17 (×3): 6 via TOPICAL

## 2022-08-16 MED ORDER — ORAL CARE MOUTH RINSE: 15.0000 mL | OROMUCOSAL | Status: DC | PRN

## 2022-08-16 MED ORDER — MAGNESIUM SULFATE 2 GM/50ML IV SOLN
2.0000 g | Freq: Once | INTRAVENOUS | Status: AC
  Administered 2022-08-16: 2 g via INTRAVENOUS
  Filled 2022-08-16: qty 50

## 2022-08-16 MED ORDER — LORAZEPAM 2 MG/ML IJ SOLN
2.0000 mg | INTRAMUSCULAR | Status: DC | PRN
  Filled 2022-08-16: qty 1

## 2022-08-16 MED ORDER — ORAL CARE MOUTH RINSE
15.0000 mL | OROMUCOSAL | Status: DC
  Administered 2022-08-16 (×5): 15 mL via OROMUCOSAL

## 2022-08-16 MED ORDER — NOREPINEPHRINE 4 MG/250ML-% IV SOLN: 2.0000 ug/min | INTRAVENOUS | Status: DC

## 2022-08-16 MED ORDER — DOCUSATE SODIUM 50 MG/5ML PO LIQD: 100.0000 mg | Freq: Two times a day (BID) | ORAL | Status: DC | PRN

## 2022-08-16 MED ORDER — ORAL CARE MOUTH RINSE
15.0000 mL | OROMUCOSAL | Status: DC
  Administered 2022-08-16 – 2022-08-22 (×22): 15 mL via OROMUCOSAL

## 2022-08-16 MED ORDER — POLYETHYLENE GLYCOL 3350 17 G PO PACK: 17.0000 g | PACK | Freq: Every day | ORAL | Status: DC | PRN

## 2022-08-16 MED ORDER — SODIUM CHLORIDE 0.9 % IV SOLN
250.0000 mL | INTRAVENOUS | Status: DC
  Administered 2022-08-18: 250 mL via INTRAVENOUS

## 2022-08-16 MED ORDER — NOREPINEPHRINE 4 MG/250ML-% IV SOLN
2.0000 ug/min | INTRAVENOUS | Status: DC
  Administered 2022-08-16: 8 ug/min via INTRAVENOUS
  Filled 2022-08-16: qty 250

## 2022-08-16 MED ORDER — SODIUM CHLORIDE 0.9 % IV SOLN
250.0000 mL | INTRAVENOUS | Status: DC
  Administered 2022-08-16: 250 mL via INTRAVENOUS

## 2022-08-16 MED ORDER — LEVETIRACETAM IN NACL 500 MG/100ML IV SOLN
500.0000 mg | Freq: Two times a day (BID) | INTRAVENOUS | Status: DC
  Administered 2022-08-16 – 2022-08-17 (×3): 500 mg via INTRAVENOUS
  Filled 2022-08-16 (×3): qty 100

## 2022-08-16 MED ORDER — ACETAMINOPHEN 325 MG PO TABS
650.0000 mg | ORAL_TABLET | Freq: Four times a day (QID) | ORAL | Status: DC | PRN
  Administered 2022-08-16: 650 mg via NASOGASTRIC
  Filled 2022-08-16: qty 2

## 2022-08-16 MED ORDER — FENTANYL CITRATE PF 50 MCG/ML IJ SOSY: 25.0000 ug | PREFILLED_SYRINGE | INTRAMUSCULAR | Status: DC | PRN

## 2022-08-16 MED ORDER — POTASSIUM CHLORIDE 20 MEQ PO PACK
40.0000 meq | PACK | Freq: Once | ORAL | Status: AC
  Administered 2022-08-16: 40 meq
  Filled 2022-08-16: qty 2

## 2022-08-16 MED ORDER — INSULIN ASPART 100 UNIT/ML IJ SOLN
0.0000 [IU] | INTRAMUSCULAR | Status: DC
  Administered 2022-08-16 (×2): 1 [IU] via SUBCUTANEOUS
  Administered 2022-08-16: 2 [IU] via SUBCUTANEOUS
  Administered 2022-08-16 – 2022-08-18 (×5): 1 [IU] via SUBCUTANEOUS

## 2022-08-16 MED ORDER — HEPARIN SODIUM (PORCINE) 5000 UNIT/ML IJ SOLN
5000.0000 [IU] | Freq: Three times a day (TID) | INTRAMUSCULAR | Status: DC
Start: 2022-08-16 — End: 2022-08-16

## 2022-08-16 MED ORDER — POTASSIUM CHLORIDE 10 MEQ/100ML IV SOLN
10.0000 meq | INTRAVENOUS | Status: AC
  Administered 2022-08-16 (×4): 10 meq via INTRAVENOUS
  Filled 2022-08-16 (×4): qty 100

## 2022-08-16 NOTE — Procedures (Signed)
Extubation Procedure Note  Patient Details:   Name: Anne Wolf DOB: 1933/08/10 MRN: [id number]   Airway Documentation:    Vent end date: 08/16/22 Vent end time: 1013   Evaluation  O2 sats: stable throughout Complications: No apparent complications Patient did tolerate procedure well. Bilateral Breath Sounds: Clear, Diminished   No, pt could not speak post extubation.  Pt extubated to 2 l/m [name].  [name] 08/16/2022, 10:13 AM

## 2022-08-16 NOTE — Consult Note (Addendum)
NEURO HOSPITALIST CONSULT NOTE   Requestig physician: Dr. Katrinka Blazing  Reason for Consult: Found unresponsive at home by husband, who suspects that she had a breakthrough seizure.   History obtained from:  Husband and Chart     HPI:                                                                                                                                          Anne Wolf is an 89 y.o. female with a history of seizures (first in 2022), on Keppra. Found on floor by husband unresponsive today. Husband observed some LUE slow abnormal movements while unconscious. Time from being found by husband to intubation in the ED was approximately 45 minutes. She was not clinically seizing per initial assessment by CCM. LTM has been started. She has been loaded with 1000 mg IV Keppra and continued on Keppra scheduled dosing at 500 mg IV BID. No electrographic or clinical seizure activity observed in the ICU while on propofol at a rate of 20. Her PMHx also includes aortic stenosis, arthritis, atrial fibrillation (on chronic anticoagulation), CHF, DM2, HLD and stroke.   Past Medical History:  Diagnosis Date   Aortic stenosis    mild; mild MR; slightly increased pulmonary artery pressure with mild RVH; normal LV-2010   Arthritis    Atrial fibrillation (HCC)    chronic anticoagulation; adequate HR control on minimal AV nodal blocking medication    Cancer (HCC)    skin cancers removed from face   CHF (congestive heart failure) (HCC)    Diabetes mellitus without complication (HCC)    2   Dyspnea    Dysrhythmia    Fasting hyperglycemia    + microalbuminuria; A1c of 6.5% in 11/2010   Hematochezia 01/2009   01/2009-presumed ischemic colitis; history of diverticulosis   History of kidney stones    Hx of adenomatous colonic polyps 2005   adenomatous polyp; negative colonoscopy in 2009   Hyperlipidemia    Seizure (HCC) 01/2022   Stroke [facility])     Past Surgical History:   Procedure Laterality Date   CATARACT EXTRACTION W/PHACO Right 03/23/2014   Procedure: CATARACT EXTRACTION PHACO AND INTRAOCULAR LENS PLACEMENT (IOC);  Surgeon: Susa Simmonds, MD;  Location: [facility];  Service: Ophthalmology;  Laterality: Right;  CDE:2.83   CATARACT EXTRACTION W/PHACO Left 07/27/2014   Procedure: CATARACT EXTRACTION PHACO AND INTRAOCULAR LENS PLACEMENT LEFT EYE CDE=6.97;  Surgeon: Susa Simmonds, MD;  Location: [facility];  Service: Ophthalmology;  Laterality: Left;   COLONOSCOPY  2009   negative; prior study with adenomatous polyp   CYSTOSCOPY WITH RETROGRADE PYELOGRAM, URETEROSCOPY AND STENT PLACEMENT Left 04/27/2022   Procedure: CYSTOSCOPY WITH RETROGRADE PYELOGRAM, URETEROSCOPY AND STENT PLACEMENT;  Surgeon: Malen Gauze, MD;  Location: [facility];  Service: Urology;  Laterality: Left;   CYSTOSCOPY/URETEROSCOPY/HOLMIUM LASER/STENT PLACEMENT Right 08/21/2016   Procedure: CYSTOSCOPY/URETEROSCOPY/RETROGRADE PYELOGRAM//STENT PLACEMENT;  Surgeon: Hildred Laser, MD;  Location: [facility];  Service: Urology;  Laterality: Right;   EYE SURGERY     HOLMIUM LASER APPLICATION Left 04/27/2022   Procedure: HOLMIUM LASER APPLICATION;  Surgeon: Malen Gauze, MD;  Location: [facility];  Service: Urology;  Laterality: Left;   INTRAOPERATIVE TRANSTHORACIC ECHOCARDIOGRAM N/A 05/31/2021   Procedure: INTRAOPERATIVE TRANSTHORACIC ECHOCARDIOGRAM;  Surgeon: Tonny Bollman, MD;  Location: [facility] OR;  Service: Open Heart Surgery;  Laterality: N/A;   RIGHT/LEFT HEART CATH AND CORONARY ANGIOGRAPHY N/A 02/04/2021   Procedure: RIGHT/LEFT HEART CATH AND CORONARY ANGIOGRAPHY;  Surgeon: Tonny Bollman, MD;  Location: [facility] INVASIVE CV LAB;  Service: Cardiovascular;  Laterality: N/A;   TRANSCATHETER AORTIC VALVE REPLACEMENT, TRANSFEMORAL N/A 05/31/2021   Procedure: Transcatheter Aortic Valve Replacement 23mm, Transfemoral;  Surgeon: Tonny Bollman, MD;  Location: [facility] OR;  Service: Open Heart Surgery;  Laterality: N/A;   Percutaneous    Family History  Problem Relation Age of Onset   Cancer Father    Arrhythmia Sister        Atrial fibrillation            Social History:  reports that she has never smoked. She has never been exposed to tobacco smoke. She has never used smokeless tobacco. She reports that she does not drink alcohol and does not use drugs.  Allergies  Allergen Reactions   Cat Hair Extract Other (See Comments)    Red eyes and Sneezing Also allergic to Dogs    MEDICATIONS:                                                                                                                     Prior to Admission:  Medications Prior to Admission  Medication Sig Dispense Refill Last Dose   alendronate (FOSAMAX) 70 MG tablet Take 70 mg by mouth every Wednesday.      apixaban (ELIQUIS) 5 MG TABS tablet Take 1 tablet (5 mg total) by mouth 2 (two) times daily. 60 tablet 11    Cholecalciferol (VITAMIN D3) 125 MCG (5000 UT) capsule Take 5,000 Units by mouth daily.      donepezil (ARICEPT) 5 MG tablet Take 1 tablet (5 mg total) by mouth at bedtime. 30 tablet 6    levETIRAcetam (KEPPRA) 500 MG tablet Take 1 tablet (500 mg total) by mouth 2 (two) times daily. 180 tablet 3    lisinopril (PRINIVIL,ZESTRIL) 10 MG tablet Take 10 mg by mouth at bedtime.      metFORMIN (GLUCOPHAGE) 500 MG tablet Take 500 mg by mouth 2 (two) times daily with a meal.      rosuvastatin (CRESTOR) 20 MG tablet Take 1 tablet (20 mg total) by mouth daily. 30 tablet 1    Scheduled:  Chlorhexidine Gluconate Cloth  6 each Topical Daily   famotidine  20 mg Per Tube Q24H   insulin aspart  0-9 Units Subcutaneous  Q4H   mouth rinse  15 mL Mouth Rinse Q2H   potassium chloride  40 mEq Per Tube Once   Continuous:  sodium chloride 10 mL/hr at 08/16/22 0505   sodium chloride Stopped (08/16/22 0159)   lactated ringers 75 mL/hr at 08/16/22 0500   levETIRAcetam 500 mg (08/16/22 0509)   magnesium sulfate bolus IVPB     norepinephrine  (LEVOPHED) Adult infusion 8 mcg/min (08/16/22 0500)   potassium chloride     propofol (DIPRIVAN) infusion 20 mcg/kg/min (08/16/22 0024)     ROS:                                                                                                                                       Unable to obtain due to sedation with propofol.    Blood pressure (!) 114/53, pulse 88, temperature (!) 102.9 F (39.4 C), temperature source Bladder, resp. rate 16, height 5\' 1"  (1.549 m), weight 69.6 kg, SpO2 97 %.   General Examination:                                                                                                       Physical Exam  HEENT-  [place]/AT. No neck stiffness Lungs- Intubated Extremities- No edema   Neurological Examination Mental Status: Intubated and sedated on propofol at a rate of 20. No cortically mediated responses to any external stimuli.  Cranial Nerves: II: Pinpoint pupils bilaterally. No blink to threat.   III,IV, VI: Eyes are conjugate at the midline. No nystagmus.   V: Trace corneal reflexes bilaterally.   VII: Face flaccidly symmetric (intubated) VIII: No response to auditory stimuli IX,X: Intubated XI: Head is midline.  XII: Unable to assess Motor/Sensory: Flaccid tone x 4. No movement to noxious stimuli. No jerking, twitching or tremoring seen. No posturing. Normal muscle bulk x 4. No asymmetry.   Deep Tendon Reflexes: Hypoactive throughout (sedated on propofol) Cerebellar/Gait: Unable to assess     Lab Results: Basic Metabolic Panel: Recent Labs  Lab 08/15/22 1704 08/15/22 1705  NA 142 140  K 3.4* 3.5  CL 105 102  CO2  --  12*  GLUCOSE 170* 175*  BUN 11 13  CREATININE 0.80 0.99  CALCIUM  --  9.6    CBC: Recent Labs  Lab 08/15/22 1704 08/15/22 1705  WBC  --  13.1*  NEUTROABS  --  5.4  HGB 15.0 14.1  HCT 44.0 44.7  MCV  --  93.3  PLT  --  187    Cardiac Enzymes: No results for input(s): "CKTOTAL", "CKMB", "CKMBINDEX", "TROPONINI" in  the last 168 hours.  Lipid Panel: No results for input(s): "CHOL", "TRIG", "HDL", "CHOLHDL", "VLDL", "LDLCALC" in the last 168 hours.  Imaging: CT Head Wo Contrast  Result Date: 08/15/2022 CLINICAL DATA:  Seizure. EXAM: CT HEAD WITHOUT CONTRAST TECHNIQUE: Contiguous axial images were obtained from the base of the skull through the vertex without intravenous contrast. RADIATION DOSE REDUCTION: This exam was performed according to the departmental dose-optimization program which includes automated exposure control, adjustment of the mA and/or kV according to patient size and/or use of iterative reconstruction technique. COMPARISON:  Brain MRI dated 02/11/2022. FINDINGS: Brain: Mild age-related atrophy and chronic microvascular ischemic changes. Right basal ganglia old lacunar infarct and encephalomalacia. There is no acute intracranial hemorrhage. No mass effect or midline shift. No extra-axial fluid collection. Vascular: No hyperdense vessel or unexpected calcification. Skull: Normal. Negative for fracture or focal lesion. Sinuses/Orbits: Mild mucoperiosteal thickening of paranasal sinuses. No air-fluid level. The mastoid air cells are clear. Other: None IMPRESSION: 1. No acute intracranial pathology. 2. Mild age-related atrophy and chronic microvascular ischemic changes. Right basal ganglia old lacunar infarct and encephalomalacia. Electronically Signed   By: Elgie Collard M.D.   On: 08/15/2022 18:08   DG Chest Portable 1 View  Result Date: 08/15/2022 CLINICAL DATA:  verify OG tube placement EXAM: PORTABLE CHEST - 1 VIEW COMPARISON:  08/15/2022 FINDINGS: Endotracheal tube tip less than 2 cm above carina directed towards the right mainstem bronchus. Gastric tube extends at least as far as the stomach. Low lung volumes with some increase in perihilar infiltrates or edema. Heart size and mediastinal contours are within normal limits. Post TAVR. No effusion. Visualized bones unremarkable. IMPRESSION: 1.  Endotracheal tube tip less than 2 cm above carina directed towards the right mainstem bronchus, consider retracting at least 2 cm. 2. Gastric tube extends at least as far as the stomach. 3. Low lung volumes with some increase in perihilar infiltrates or edema. Electronically Signed   By: Corlis Leak M.D.   On: 08/15/2022 18:05   DG Chest Portable 1 View  Result Date: 08/15/2022 CLINICAL DATA:  Intubated EXAM: PORTABLE CHEST 1 VIEW COMPARISON:  08/19/2021 FINDINGS: Single frontal view of the chest demonstrates endotracheal tube overlying tracheal air column, tip 1.3 cm above carina. Cardiac silhouette is stable. Aortic valve prosthesis unchanged. No airspace disease, effusion, or pneumothorax. No acute bony abnormalities. IMPRESSION: 1. No complication after intubation. Endotracheal tube tip 1.3 cm above carina. 2. No acute airspace disease. Electronically Signed   By: Sharlet Salina M.D.   On: 08/15/2022 17:33    Assessment: 89 year old female with a history of stroke and seizures who was found unresponsive at home by husband, who suspects that she had a breakthrough seizure. She was intubated on arrival and started on LTM EEG.  - Exam under propofol sedation at a rate of 20 reveals no clinical seizure activity. Trace corneal reflexes. Pinpoint pupils. No neck stiffness. Flaccid tone in all 4 extremities.  - CT head:  No acute intracranial pathology. Mild age-related atrophy and chronic microvascular ischemic changes. Right basal ganglia old lacunar infarct and encephalomalacia. - LTM EEG bedside review: Low voltage in all leads. No electrographic seizure activity seen. Beta waves are noted.   Recommendations: - Continue LTM EEG - Continue Keppra at 500 mg IV BID - Wean slowly off propofol and attempt extubation using EEG as a guide to ensure that breakthrough seizure  activity does not recur.  - MRI brain without contrast when able.  - Inpatient seizure precautions.   40 minutes spent in the  emergent neurological evaluation and management of this critically ill patient.    Electronically signed: Dr. Caryl Pina 08/16/2022, 12:51 AM

## 2022-08-16 NOTE — Progress Notes (Signed)
LTM EEG hooked up and running - no initial skin breakdown - push button tested - [facility] monitoring.  

## 2022-08-16 NOTE — Progress Notes (Signed)
NAME:  Anne Wolf, MRN:  [id number], DOB:  04/03/1933, LOS: 1 ADMISSION DATE:  08/15/2022, CONSULTATION DATE:  08/16/2022 REFERRING MD:  Estell Harpin - [facility] EDP CHIEF COMPLAINT:  Status epilepticus   History of Present Illness:  89 year old woman with PMH as below, which is significant for stroke, seizure, Atrial fib, DM, and aortic stenosis. She presented to [facility] ED 6/25 after being found down by her husband at home. He describes some failure to thrive over the past few months. She has been more lethargic, not eating or drinking, and having dizzy spells. He found her down and unresponsive and called EMS. In the ED there were convulsions seen in the left upper extremity. The patient had no return to baseline for 40 minutes and she was intubated for airway protection. Head CT negative. Keppra for seizure. Transferred to [facility] neuro ICU for critical care admission and EEG monitoring.   Pertinent Medical History:   Past Medical History:  Diagnosis Date   Aortic stenosis    mild; mild MR; slightly increased pulmonary artery pressure with mild RVH; normal LV-2010   Arthritis    Atrial fibrillation (HCC)    chronic anticoagulation; adequate HR control on minimal AV nodal blocking medication    Cancer (HCC)    skin cancers removed from face   CHF (congestive heart failure) (HCC)    Diabetes mellitus without complication (HCC)    2   Dyspnea    Dysrhythmia    Fasting hyperglycemia    + microalbuminuria; A1c of 6.5% in 11/2010   Hematochezia 01/2009   01/2009-presumed ischemic colitis; history of diverticulosis   History of kidney stones    Hx of adenomatous colonic polyps 2005   adenomatous polyp; negative colonoscopy in 2009   Hyperlipidemia    Seizure (HCC) 01/2022   Stroke (HCC)    Significant Hospital Events: Including procedures, antibiotic start and stop dates in addition to other pertinent events   6/26 - Presented to [facility] as transfer from [facility] for status epilepticus. Intubated  at [facility] for airway protection. LTM EEG, Prop for burst suppression.  Interim History / Subjective:  No significant events overnight Admitted from [facility] Hypotensive with prop/sedation Requiring pressors, up to NE Waking up on minimal prop, moving extremities purposefully Plan for extubation  Objective:  Blood pressure (!) 115/52, pulse 82, temperature (!) 101.1 F (38.4 C), resp. rate (!) 25, height 5\' 1"  (1.549 m), weight 69.6 kg, SpO2 98 %.    Vent Mode: PRVC FiO2 (%):  [40 %-100 %] 40 % Set Rate:  [16 bmp] 16 bmp Vt Set:  [380 mL-450 mL] 380 mL PEEP:  [5 cmH20] 5 cmH20 Plateau Pressure:  [10 cmH20-17 cmH20] 10 cmH20   Intake/Output Summary (Last 24 hours) at 08/16/2022 0731 Last data filed at 08/16/2022 0600 Gross per 24 hour  Intake 2782.92 ml  Output 325 ml  Net 2457.92 ml    Filed Weights   08/15/22 1900  Weight: 69.6 kg   Physical Examination: General: Acutely ill-appearing elderly woman in NAD. HEENT: [place]/AT, anicteric sclera, PERRL 3mm, moist mucous membranes. Broken front teeth with scant blood in mouth. Neuro:  Minimally sedated, mildly lethargic.  Responds to noxious stimuli. Following commands intermittently. Moves all 4 extremities spontaneously. +Corneal, +Cough, and +Gag  CV: Irregularly irregular rhythm, rate 80s-90s, no m/g/r. PULM: Breathing even and unlabored on vent (PSV 5/5, FiO2 40%). Lung fields diminished at bilateral bases, otherwise CTAB. GI: Soft, nontender, nondistended. Normoactive bowel sounds. Extremities: No significant LE  edema noted. Skin: Warm/dry, no rashes.  Resolved Hospital Problem List:     Assessment & Plan:   Status epilepticus History of seizures beginning in 2022, ?breakthrough. CT Head 6/26 NAICA, mild age-related atrophy/chronic microvascular ischemic changes, old R basal ganglia lacunar infarct. - Neuro following, appreciate recommendations - LTM EEG - AEDs per Neuro (Keppra), Ativan PRN - Propofol gtt for burst  suppression, wean as able per Neuro to facilitate extubation - MRI Brain without contrast when able, will extubate and allow for time off of sedation - Seizure precautions - Neuroprotective measures: HOB > 30 degrees, normoglycemia, normothermia, electrolytes WNL  Acute hypoxemic respiratory failure secondary to obtundation Concern for aspiration PNA - Weaning on vent this morning, hopeful for extubation today 6/26 - Wean FiO2 for O2 sat > 90% - VAP bundle - Pulmonary hygiene - PAD protocol for sedation: Propofol and Fentanyl for goal RASS -1 to -2, will transition to Precedex as needed - Unasyn for aspiration coverage - Follow CXR  Atrial fibrillation, controlled rate - AC held as present, reported oral bleeding on admission - Resume as clinically appropriate  Hypotension, likely sedation-related Lactic acidosis secondary to seizure, ?infection - Goal MAP > 65 - Fluid resuscitation as tolerated - Levophed titrated to goal MAP - Trend WBC, fever curve, LA - F/u Cx data  Hypokalemia Hypomagnesemia - Trend BMP, Mg - Replete electrolytes as indicated - Monitor I&Os  Alzheimer's dementia - Hold Aricept for now  DM - SSI - CBGs Q4H - Goal CBG 140-180 - Hold home metformin for now  Goals of care: Per H&P 6/26 "Spoke with husband who described her recent decline. She has been clear in the past she wouldn't want to be on a machine, but he is hopeful this is reversible. I explained it is entirely possible she may not have a good recovery from this considering her baseline dementia and now prolonged status. He would like to place DNR and give her approximately 48 hours to mount a recovery."   Best Practice (right click and "Reselect all SmartList Selections" daily)   Diet/type: NPO DVT prophylaxis: prophylactic heparin  GI prophylaxis: H2B Lines: N/A Foley:  N/A Code Status:  DNR Last date of multidisciplinary goals of care discussion [6/26AM as above - DNR, time-limited  trial of MV ]  Critical care time:    The patient is critically ill with multiple organ system failure and requires high complexity decision making for assessment and support, frequent evaluation and titration of therapies, advanced monitoring, review of radiographic studies and interpretation of complex data.   Critical Care Time devoted to patient care services, exclusive of separately billable procedures, described in this note is 36 minutes.  Tim Lair, PA-C [facility] 08/16/22 7:37 AM  Please see [email or web address] for pager details.  From 7A-7P if no response, please call [phone] After hours, please call ELink [phone]

## 2022-08-16 NOTE — Progress Notes (Signed)
An USGPIV (ultrasound guided PIV) has been placed for short-term vasopressor infusion. A correctly placed ivWatch must be used when administering Vasopressors. Should this treatment be needed beyond 72 hours, central line access should be obtained.  It will be the responsibility of the bedside nurse to follow best practice to prevent extravasations.   

## 2022-08-16 NOTE — Procedures (Signed)
Patient Name: Anne Wolf  MRN: [id number]  Epilepsy Attending: Charlsie Quest  Referring Physician/Provider: Caryl Pina, MD  Duration: 08/16/2022 0310 to 08/17/2022 0310  Patient history: 89 year old female with a history of stroke and seizures who was found unresponsive at home by husband, who suspects that she had a breakthrough seizure. EEG to evaluate for seizure.  Level of alertness:  comatose-->awake, asleep  AEDs during EEG study: LEV, propofol  Technical aspects: This EEG study was done with scalp electrodes positioned according to the 10-20 International system of electrode placement. Electrical activity was reviewed with band pass filter of 1-70Hz , sensitivity of 7 uV/mm, display speed of 40mm/sec with a 60Hz  notched filter applied as appropriate. EEG data were recorded continuously and digitally stored.  Video monitoring was available and reviewed as appropriate.  Description: At the beginning of the study, EEG showed continuous generalized 2-3 Hz delta slowing with overriding 15 to 18 Hz beta activity distributed symmetrically and diffusely. As sedation was weaned, EEG showed posterior dominant rhythm of 9Hz  activity of moderate voltage (25-35 uV) seen predominantly in posterior head regions, symmetric and reactive to eye opening and eye closing. Sleep was characterized by sleep spindles (12-14hz ), maximal fronto-central region. EEG also showed continuous 3 to 6 Hz theta-delta slowing in right temporal region. Hyperventilation and photic stimulation were not performed.     ABNORMALITY - Continuous slow, generalized and maximal right temporal  IMPRESSION: This study was initially suggestive of severe diffuse encephalopathy likely related to sedation. As sedation was weaned, EEG improved and was suggestive of cortical dysfunction in right temporal region likely secondary to underlying structural abnormality/encephalomalacia. No seizures or epileptiform discharges were seen  throughout the recording.  Kejon Feild [name]

## 2022-08-16 NOTE — Progress Notes (Signed)
Pharmacy Antibiotic Note  Anne Wolf is a 89 y.o. female admitted on 08/15/2022 in status epilepticus, now with CXR concerinng for aspiration PNA.  Pharmacy has been consulted for Unasyn dosing.  Plan: Unasyn 3g IV Q6H.  Height: 5\' 1"  (154.9 cm) Weight: 69.6 kg (153 lb 7 oz) IBW/kg (Calculated) : 47.8  Temp (24hrs), Avg:100.9 F (38.3 C), Min:98.6 F (37 C), Max:103 F (39.4 C)  Recent Labs  Lab 08/15/22 1704 08/15/22 1705 08/15/22 1941 08/16/22 0434  WBC  --  13.1*  --  15.3*  CREATININE 0.80 0.99  --  0.94  LATICACIDVEN  --  >9.0* 6.5* 3.6*    Estimated Creatinine Clearance: 38.3 mL/min (by C-G formula based on SCr of 0.94 mg/dL).    Allergies  Allergen Reactions   Cat Hair Extract Other (See Comments)    Red eyes and Sneezing Also allergic to Dogs    Thank you for allowing pharmacy to be a part of this patient's care.  Vernard Gambles, PharmD, BCPS  08/16/2022 6:33 AM

## 2022-08-16 NOTE — H&P (Signed)
NAME:  Anne Wolf, MRN:  [id number], DOB:  May 09, 1933, LOS: 1 ADMISSION DATE:  08/15/2022, CONSULTATION DATE:  6/26 REFERRING MD:  [facility] ED, CHIEF COMPLAINT:  Status epilepticus   History of Present Illness:  89 year old female with PMH as below, which is significant for stroke, seizure, Atrial fib, DM, and aortic stenosis. She presented to [facility] ED 6/25 after being found down by her husband at home. He describes some failure to thrive over the past few months. She has been more lethargic, not eating or drinking, and having dizzy spells. He found her down and unresponsive and called EMS. In the ED there were convulsions seen in the left upper extremity. The patient had no return to baseline for 40 minutes and she was intubated for airway protection. Head CT negative. Keppra for seizure. Transferred to [facility] neuro ICU for critical care admission and EEG monitoring.   Pertinent  Medical History   has a past medical history of Aortic stenosis, Arthritis, Atrial fibrillation (HCC), Cancer (HCC), CHF (congestive heart failure) (HCC), Diabetes mellitus without complication (HCC), Dyspnea, Dysrhythmia, Fasting hyperglycemia, Hematochezia (01/2009), History of kidney stones, adenomatous colonic polyps (2005), Hyperlipidemia, Seizure (HCC) (01/2022), and Stroke [facility]).   Significant Hospital Events: Including procedures, antibiotic start and stop dates in addition to other pertinent events     Interim History / Subjective:    Objective   Blood pressure (!) 114/53, pulse 88, temperature (!) 102.9 F (39.4 C), temperature source Bladder, resp. rate 16, height 5\' 1"  (1.549 m), weight 69.6 kg, SpO2 97 %.    Vent Mode: PRVC FiO2 (%):  [40 %-100 %] 40 % Set Rate:  [16 bmp] 16 bmp Vt Set:  [380 mL-450 mL] 380 mL PEEP:  [5 cmH20] 5 cmH20 Plateau Pressure:  [14 cmH20-17 cmH20] 16 cmH20   Intake/Output Summary (Last 24 hours) at 08/16/2022 0045 Last data filed at 08/15/2022 2149 Gross per 24 hour   Intake 2195.34 ml  Output --  Net 2195.34 ml   Filed Weights   08/15/22 1900  Weight: 69.6 kg    Examination: General: elderly female on vent in NAD HENT: [place]/AT, pupils pinpoint, no JVD. Broken front teeth removed by RN Lungs: Clear bilateral breath sounds Cardiovascular: RRR, no MRG, no edema Abdomen: Soft, NT, ND Extremities: No acute deformity, normal bulk and tone.  Neuro: Examined on propofol. Flexion of upper extremities to pain.    Resolved Hospital Problem list     Assessment & Plan:   Status epilepticus - Admit to ICU - AEDs per neurology - STAT LTM - Frequent neuro checks   Acute hypoxemic respiratory failure secondary to obtundation.  - Full vent support - Propofol and PRN fentanyl for RASS goal -1 to -2.  - Daily SBT/SAT  Atrial fib: controlled rate - holding Eliquis for tonight, re-evaluate in AM  Lactic acidosis secondary to seizure - repeat chemistry and lactic  Alzheimer's dementia - hold aricept  DM - hold metformin - CBG monitoring and SSI  Goals of care: Spoke with husband who described her recent decline. She has been clear in the past she wouldn't want to be on a machine, but he is hopeful this is reversible. I explained it is entirely possible she may not have a good recovery from this considering her baseline dementia and now prolonged status. He would like to place DNR and give her approximately 48 hours to mount a recovery.   Best Practice (right click and "Reselect all SmartList Selections" daily)  Diet/type: NPO DVT prophylaxis: prophylactic heparin  GI prophylaxis: H2B Lines: N/A Foley:  N/A Code Status:  DNR Last date of multidisciplinary goals of care discussion [ ]   Labs   CBC: Recent Labs  Lab 08/15/22 1704 08/15/22 1705  WBC  --  13.1*  NEUTROABS  --  5.4  HGB 15.0 14.1  HCT 44.0 44.7  MCV  --  93.3  PLT  --  187    Basic Metabolic Panel: Recent Labs  Lab 08/15/22 1704 08/15/22 1705  NA 142 140  K 3.4*  3.5  CL 105 102  CO2  --  12*  GLUCOSE 170* 175*  BUN 11 13  CREATININE 0.80 0.99  CALCIUM  --  9.6   GFR: Estimated Creatinine Clearance: 36.4 mL/min (by C-G formula based on SCr of 0.99 mg/dL). Recent Labs  Lab 08/15/22 1705 08/15/22 1941  WBC 13.1*  --   LATICACIDVEN >9.0* 6.5*    Liver Function Tests: Recent Labs  Lab 08/15/22 1705  AST 39  ALT 16  ALKPHOS 49  BILITOT 1.1  PROT 8.1  ALBUMIN 4.2   No results for input(s): "LIPASE", "AMYLASE" in the last 168 hours. No results for input(s): "AMMONIA" in the last 168 hours.  ABG    Component Value Date/Time   PHART 7.35 08/15/2022 1810   PCO2ART 43 08/15/2022 1810   PO2ART 126 (H) 08/15/2022 1810   HCO3 23.7 08/15/2022 1810   TCO2 16 (L) 08/15/2022 1704   ACIDBASEDEF 1.7 08/15/2022 1810   O2SAT 99.8 08/15/2022 1810     Coagulation Profile: No results for input(s): "INR", "PROTIME" in the last 168 hours.  Cardiac Enzymes: No results for input(s): "CKTOTAL", "CKMB", "CKMBINDEX", "TROPONINI" in the last 168 hours.  HbA1C: Hgb A1c MFr Bld  Date/Time Value Ref Range Status  04/25/2022 02:24 PM 6.4 (H) 4.8 - 5.6 % Final    Comment:    (NOTE)         Prediabetes: 5.7 - 6.4         Diabetes: >6.4         Glycemic control for adults with diabetes: <7.0   02/11/2022 07:07 AM 6.3 (H) 4.8 - 5.6 % Final    Comment:    (NOTE)         Prediabetes: 5.7 - 6.4         Diabetes: >6.4         Glycemic control for adults with diabetes: <7.0     CBG: Recent Labs  Lab 08/15/22 1656  GLUCAP 181*    Review of Systems:   Patient is encephalopathic and/or intubated; therefore, history has been obtained from chart review.    Past Medical History:  She,  has a past medical history of Aortic stenosis, Arthritis, Atrial fibrillation (HCC), Cancer (HCC), CHF (congestive heart failure) (HCC), Diabetes mellitus without complication (HCC), Dyspnea, Dysrhythmia, Fasting hyperglycemia, Hematochezia (01/2009), History of  kidney stones, adenomatous colonic polyps (2005), Hyperlipidemia, Seizure (HCC) (01/2022), and Stroke [facility]).   Surgical History:   Past Surgical History:  Procedure Laterality Date   CATARACT EXTRACTION W/PHACO Right 03/23/2014   Procedure: CATARACT EXTRACTION PHACO AND INTRAOCULAR LENS PLACEMENT (IOC);  Surgeon: Susa Simmonds, MD;  Location: [facility] ORS;  Service: Ophthalmology;  Laterality: Right;  CDE:2.83   CATARACT EXTRACTION W/PHACO Left 07/27/2014   Procedure: CATARACT EXTRACTION PHACO AND INTRAOCULAR LENS PLACEMENT LEFT EYE CDE=6.97;  Surgeon: Susa Simmonds, MD;  Location: [facility] ORS;  Service: Ophthalmology;  Laterality: Left;  COLONOSCOPY  2009   negative; prior study with adenomatous polyp   CYSTOSCOPY WITH RETROGRADE PYELOGRAM, URETEROSCOPY AND STENT PLACEMENT Left 04/27/2022   Procedure: CYSTOSCOPY WITH RETROGRADE PYELOGRAM, URETEROSCOPY AND STENT PLACEMENT;  Surgeon: Malen Gauze, MD;  Location: [facility] ORS;  Service: Urology;  Laterality: Left;   CYSTOSCOPY/URETEROSCOPY/HOLMIUM LASER/STENT PLACEMENT Right 08/21/2016   Procedure: CYSTOSCOPY/URETEROSCOPY/RETROGRADE PYELOGRAM//STENT PLACEMENT;  Surgeon: Hildred Laser, MD;  Location: [facility] ORS;  Service: Urology;  Laterality: Right;   EYE SURGERY     HOLMIUM LASER APPLICATION Left 04/27/2022   Procedure: HOLMIUM LASER APPLICATION;  Surgeon: Malen Gauze, MD;  Location: [facility] ORS;  Service: Urology;  Laterality: Left;   INTRAOPERATIVE TRANSTHORACIC ECHOCARDIOGRAM N/A 05/31/2021   Procedure: INTRAOPERATIVE TRANSTHORACIC ECHOCARDIOGRAM;  Surgeon: Tonny Bollman, MD;  Location: [facility] OR;  Service: Open Heart Surgery;  Laterality: N/A;   RIGHT/LEFT HEART CATH AND CORONARY ANGIOGRAPHY N/A 02/04/2021   Procedure: RIGHT/LEFT HEART CATH AND CORONARY ANGIOGRAPHY;  Surgeon: Tonny Bollman, MD;  Location: [facility] INVASIVE CV LAB;  Service: Cardiovascular;  Laterality: N/A;   TRANSCATHETER AORTIC VALVE REPLACEMENT, TRANSFEMORAL N/A 05/31/2021   Procedure:  Transcatheter Aortic Valve Replacement 23mm, Transfemoral;  Surgeon: Tonny Bollman, MD;  Location: [facility] OR;  Service: Open Heart Surgery;  Laterality: N/A;  Percutaneous     Social History:   reports that she has never smoked. She has never been exposed to tobacco smoke. She has never used smokeless tobacco. She reports that she does not drink alcohol and does not use drugs.   Family History:  Her family history includes Arrhythmia in her sister; Cancer in her father.   Allergies Allergies  Allergen Reactions   Cat Hair Extract Other (See Comments)    Red eyes and Sneezing Also allergic to Dogs     Home Medications  Prior to Admission medications   Medication Sig Start Date End Date Taking? Authorizing Provider  alendronate (FOSAMAX) 70 MG tablet Take 70 mg by mouth every Wednesday.    [provider]  apixaban (ELIQUIS) 5 MG TABS tablet Take 1 tablet (5 mg total) by mouth 2 (two) times daily. 04/10/22   Windell Norfolk, MD  Cholecalciferol (VITAMIN D3) 125 MCG (5000 UT) capsule Take 5,000 Units by mouth daily.    [provider]  donepezil (ARICEPT) 5 MG tablet Take 1 tablet (5 mg total) by mouth at bedtime. 07/11/22   Windell Norfolk, MD  levETIRAcetam (KEPPRA) 500 MG tablet Take 1 tablet (500 mg total) by mouth 2 (two) times daily. 04/10/22 04/05/23  Windell Norfolk, MD  lisinopril (PRINIVIL,ZESTRIL) 10 MG tablet Take 10 mg by mouth at bedtime.    [provider]  metFORMIN (GLUCOPHAGE) 500 MG tablet Take 500 mg by mouth 2 (two) times daily with a meal.    [provider]  rosuvastatin (CRESTOR) 20 MG tablet Take 1 tablet (20 mg total) by mouth daily. 02/13/22   de Verdis Prime, NP     Critical care time: 46 minutes     Joneen Roach, AGACNP-BC [place] [facility]  See Amion for personal pager PCCM on call pager [phone] until 7pm. Please call Elink 7p-7a. [phone]  08/16/2022 1:18 AM

## 2022-08-16 NOTE — Progress Notes (Addendum)
eLink Physician-Brief Progress Note Patient Name: ATALIE OROS DOB: 07-25-1933 MRN: [id number]   Date of Service  08/16/2022  HPI/Events of Note  Request for tylenol for fever.   eICU Interventions  Order placed.  Also I note that X ray done outside showed deep ETT. Not sure if that was fixed. Will repeat cxt to confirm.  Also clarified with bedside team that Apixaban is being held due to some oral bleeding that ws noted. Heparin Hardwick also Dcd for this. Day team to re assess and add back depending on how she does.      Intervention Category Major Interventions: Respiratory failure - evaluation and management  Jayvan Mcshan G Arlyss Weathersby 08/16/2022, 3:53 AM  Addendum at 6:25 am - CXR noted, ETT looks ok. Significant infiltrates. Unasyn to be dosed by pharmacy., order placed

## 2022-08-16 NOTE — Progress Notes (Signed)
[facility] ADULT ICU REPLACEMENT PROTOCOL   The patient does apply for the [facility] Adult ICU Electrolyte Replacment Protocol based on the criteria listed below:   1.Exclusion criteria: TCTS, ECMO, Dialysis, and Myasthenia Gravis patients 2. Is GFR >/= 30 ml/min? Yes.    Patient's GFR today is 59 3. Is SCr </= 2? Yes Patient's SCr is 0.94 mg/dL 4. Did SCr increase >/= 0.5 in 24 hours? No. 5.Pt's weight >40kg  Yes.   6. Abnormal electrolyte(s): K+ 2.7, Mag 1.7  7. Electrolytes replaced per protocol 8.  Call MD STAT for K+ </= 2.5, Phos </= 1, or Mag </= 1 Physician:  Lucile Shutters [facility] [address] 08/16/2022 5:29 AM

## 2022-08-17 ENCOUNTER — Inpatient Hospital Stay (HOSPITAL_COMMUNITY): Payer: No Typology Code available for payment source

## 2022-08-17 ENCOUNTER — Other Ambulatory Visit (HOSPITAL_COMMUNITY): Payer: Self-pay

## 2022-08-17 DIAGNOSIS — I6782 Cerebral ischemia: Secondary | ICD-10-CM | POA: Diagnosis not present

## 2022-08-17 DIAGNOSIS — G40901 Epilepsy, unspecified, not intractable, with status epilepticus: Secondary | ICD-10-CM | POA: Diagnosis not present

## 2022-08-17 DIAGNOSIS — R9089 Other abnormal findings on diagnostic imaging of central nervous system: Secondary | ICD-10-CM | POA: Diagnosis not present

## 2022-08-17 DIAGNOSIS — I6381 Other cerebral infarction due to occlusion or stenosis of small artery: Secondary | ICD-10-CM | POA: Diagnosis not present

## 2022-08-17 DIAGNOSIS — R569 Unspecified convulsions: Secondary | ICD-10-CM | POA: Diagnosis not present

## 2022-08-17 LAB — MAGNESIUM: Magnesium: 1.8 mg/dL (ref 1.7–2.4)

## 2022-08-17 LAB — RENAL FUNCTION PANEL
Albumin: 2.5 g/dL — ABNORMAL LOW (ref 3.5–5.0)
Anion gap: 9 (ref 5–15)
BUN: 11 mg/dL (ref 8–23)
CO2: 22 mmol/L (ref 22–32)
Calcium: 8.2 mg/dL — ABNORMAL LOW (ref 8.9–10.3)
Chloride: 108 mmol/L (ref 98–111)
Creatinine, Ser: 0.6 mg/dL (ref 0.44–1.00)
GFR, Estimated: >60 mL/min (ref >60)
Glucose, Bld: 111 mg/dL — ABNORMAL HIGH (ref 70–99)
Phosphorus: 2.4 mg/dL — ABNORMAL LOW (ref 2.5–4.6)
Potassium: 3.6 mmol/L (ref 3.5–5.1)
Sodium: 139 mmol/L (ref 135–145)

## 2022-08-17 LAB — GLUCOSE, CAPILLARY
Glucose-Capillary: 109 mg/dL — ABNORMAL HIGH (ref 70–99)
Glucose-Capillary: 93 mg/dL (ref 70–99)
Glucose-Capillary: 94 mg/dL (ref 70–99)
Glucose-Capillary: 99 mg/dL (ref 70–99)

## 2022-08-17 LAB — PROTIME-INR
INR: 1.4 — ABNORMAL HIGH (ref 0.8–1.2)
Prothrombin Time: 17.6 seconds — ABNORMAL HIGH (ref 11.4–15.2)

## 2022-08-17 MED ORDER — APIXABAN 5 MG PO TABS
5.0000 mg | ORAL_TABLET | Freq: Two times a day (BID) | ORAL | Status: DC
  Administered 2022-08-17 – 2022-08-22 (×11): 5 mg via ORAL
  Filled 2022-08-17 (×11): qty 1

## 2022-08-17 MED ORDER — METOPROLOL TARTRATE 5 MG/5ML IV SOLN: 2.5000 mg | Freq: Four times a day (QID) | INTRAVENOUS | Status: DC | PRN

## 2022-08-17 MED ORDER — METOPROLOL TARTRATE 25 MG PO TABS
25.0000 mg | ORAL_TABLET | Freq: Two times a day (BID) | ORAL | Status: DC
  Administered 2022-08-17 – 2022-08-22 (×10): 25 mg via ORAL
  Filled 2022-08-17 (×10): qty 1

## 2022-08-17 MED ORDER — LORAZEPAM 2 MG/ML IJ SOLN
0.5000 mg | Freq: Once | INTRAMUSCULAR | Status: AC
  Administered 2022-08-17: 0.5 mg via INTRAVENOUS

## 2022-08-17 MED ORDER — DEXTROSE 50 % IV SOLN
INTRAVENOUS | Status: AC
  Administered 2022-08-17: 25 mL
  Filled 2022-08-17: qty 50

## 2022-08-17 MED ORDER — LACTATED RINGERS IV BOLUS
500.0000 mL | Freq: Once | INTRAVENOUS | Status: AC
  Administered 2022-08-17: 500 mL via INTRAVENOUS

## 2022-08-17 MED ORDER — MAGNESIUM SULFATE 2 GM/50ML IV SOLN
2.0000 g | Freq: Once | INTRAVENOUS | Status: AC
  Administered 2022-08-17: 2 g via INTRAVENOUS
  Filled 2022-08-17: qty 50

## 2022-08-17 MED ORDER — POTASSIUM CHLORIDE CRYS ER 20 MEQ PO TBCR
40.0000 meq | EXTENDED_RELEASE_TABLET | Freq: Once | ORAL | Status: AC
  Administered 2022-08-17: 40 meq via ORAL
  Filled 2022-08-17: qty 2

## 2022-08-17 MED ORDER — LEVETIRACETAM 750 MG PO TABS
750.0000 mg | ORAL_TABLET | Freq: Two times a day (BID) | ORAL | Status: DC
  Administered 2022-08-17 – 2022-08-22 (×10): 750 mg via ORAL
  Filled 2022-08-17 (×10): qty 1

## 2022-08-17 MED ORDER — POLYETHYLENE GLYCOL 3350 17 G PO PACK: 17.0000 g | PACK | Freq: Every day | ORAL | Status: DC | PRN

## 2022-08-17 MED ORDER — LEVETIRACETAM 250 MG PO TABS
250.0000 mg | ORAL_TABLET | Freq: Once | ORAL | Status: AC
  Administered 2022-08-17: 250 mg via ORAL
  Filled 2022-08-17: qty 1

## 2022-08-17 MED ORDER — POTASSIUM PHOSPHATES 15 MMOLE/5ML IV SOLN
15.0000 mmol | Freq: Once | INTRAVENOUS | Status: AC
  Administered 2022-08-17: 15 mmol via INTRAVENOUS
  Filled 2022-08-17: qty 5

## 2022-08-17 MED ORDER — LORAZEPAM 2 MG/ML IJ SOLN: 2.0000 mg | Freq: Once | INTRAMUSCULAR | Status: DC | PRN

## 2022-08-17 NOTE — TOC Initial Note (Signed)
Transition of Care [facility]) - Initial/Assessment Note    Patient Details  Name: Anne Wolf MRN: [id number] Date of Birth: 08-Dec-1933  Transition of [facility]) CM/SW Contact:    Mearl Latin, LCSW Phone Number: 08/17/2022, 9:54 AM  Clinical Narrative:                 Patient admitted from home with spouse. TOC will continue to follow patient to identify any needs that arise.     Barriers to Discharge: Continued Medical Work up   Patient Goals and CMS Choice            Expected Discharge Plan and Services       Living arrangements for the past 2 months: Single Family Home                                      Prior Living Arrangements/Services Living arrangements for the past 2 months: Single Family Home Lives with:: Spouse Patient language and need for interpreter reviewed:: Yes        Need for Family Participation in Patient Care: Yes (Comment) Care giver support system in place?: Yes (comment) Current home services: DME (RW) Criminal Activity/Legal Involvement Pertinent to Current Situation/Hospitalization: No - Comment as needed  Activities of Daily Living      Permission Sought/Granted                  Emotional Assessment   Attitude/Demeanor/Rapport: Unable to Assess Affect (typically observed): Unable to Assess Orientation: : Oriented to Self Alcohol / Substance Use: Not Applicable Psych Involvement: No (comment)  Admission diagnosis:  Status epilepticus (HCC) [G40.901] Patient Active Problem List   Diagnosis Date Noted   Acute respiratory failure with hypoxia (HCC) 08/16/2022   Status epilepticus (HCC) 08/15/2022   History of UTI 08/04/2022   Stroke (cerebrum) (HCC) 02/10/2022   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   Transient ischemic attack (TIA) 02/18/2021   Essential hypertension 02/18/2021   Type 2 diabetes mellitus (HCC) 02/18/2021   Dyspnea 10/14/2020   Nephrolithiasis 05/26/2018   History of  diagnostic tests 08/07/2012   Aortic stenosis    Hx of adenomatous colonic polyps    Long term (current) use of anticoagulants 05/31/2010   Mixed hyperlipidemia 02/08/2009   PCP:  Carylon Perches, MD Pharmacy:   [facility] - [place], [place] - [address] [place] [address] Phone: [phone] Fax: [phone]  [facility] [address] [place] - [address] [place] [address] [place] [address] [place] [address] Phone: [phone] Fax: [phone]  [id number] - [place], [place] - [address] AT [facility] OF [address] [place] [address] Phone: [phone] Fax: [phone]     Social Determinants of Health (SDOH) Social History: SDOH Screenings   Depression (PHQ2-9): Low Risk  (04/06/2021)  Tobacco Use: Low Risk  (08/15/2022)   SDOH Interventions:     Readmission Risk Interventions    06/01/2021    1:40 PM  Readmission Risk Prevention Plan  Post Dischage Appt Complete  Medication Screening Complete  Transportation Screening Complete

## 2022-08-17 NOTE — Evaluation (Signed)
Physical Therapy Evaluation Patient Details Name: Anne Wolf MRN: [id number] DOB: 1934-02-19 Today's Date: 08/17/2022  History of Present Illness  [name] is an 89 y.o. female admitted 08/15/22 with seizure. Found down at home by spouse unresponsive.  Intubated on admission and extubated 08/16/22. Started on LTM EEG and head CT negative for acute changes.  PMH: afib on Coumadin, DM, HLD, CVA, cancer, CHF, aortic stenosis.  Clinical Impression  Patient presents with decreased balance, decreased cognition, decreased activity tolerance and will benefit from skilled PT in the acute setting and may benefit from follow up HHPT at d/c.  Currently needing min A for up to EOB then for stand step to recliner (limited as still on LTM EEG.)  She was oriented to hospital, not city, year or situation.  She normally completes BADL's on her own per her spouse who was in the room and she ambulates in the home unaided without devices.  She was walking a mile a day with spouse outside till recently when she felt weaker and has not asked to go since then.  Will follow up and attempt ambulation when EEG removed.        Recommendations for follow up therapy are one component of a multi-disciplinary discharge planning process, led by the attending physician.  Recommendations may be updated based on patient status, additional functional criteria and insurance authorization.  Follow Up Recommendations       Assistance Recommended at Discharge Intermittent Supervision/Assistance  Patient can return home with the following  A little help with walking and/or transfers;A little help with bathing/dressing/bathroom;Direct supervision/assist for medications management;Assist for transportation;Help with stairs or ramp for entrance;Assistance with cooking/housework    Equipment Recommendations Other (comment) (TBA)  Recommendations for Other Services       Functional Status Assessment Patient has had a recent  decline in their functional status and demonstrates the ability to make significant improvements in function in a reasonable and predictable amount of time.     Precautions / Restrictions Precautions Precautions: Fall Precaution Comments: seizure      Mobility  Bed Mobility Overal bed mobility: Needs Assistance Bed Mobility: Supine to Sit     Supine to sit: Min assist, HOB elevated     General bed mobility comments: using rail with cues and assist with other hand to sit up and pt moving legs off EOB on her own    Transfers Overall transfer level: Needs assistance Equipment used: 1 person hand held assist Transfers: Sit to/from Stand, Bed to chair/wheelchair/BSC Sit to Stand: Min assist   Step pivot transfers: Min assist       General transfer comment: up to stand with R HHA, pt reaching to arm of chair with L hand and stepped to recliner reaching to opposite arm of chair with L hand.  PT managing EEG lines, telemetry and IV.    Ambulation/Gait                  Stairs            Wheelchair Mobility    Modified Rankin (Stroke Patients Only)       Balance Overall balance assessment: Needs assistance   Sitting balance-Leahy Scale: Fair     Standing balance support: Bilateral upper extremity supported Standing balance-Leahy Scale: Poor Standing balance comment: reported feeling wobbly on her feet needing UE support today  Pertinent Vitals/Pain Pain Assessment Pain Assessment: No/denies pain    Home Living Family/patient expects to be discharged to:: Private residence Living Arrangements: Spouse/significant other Available Help at Discharge: Family;Available 24 hours/day Type of Home: House Home Access: Stairs to enter   [employer] of Steps: 2 at one entrance, 6 with rails at another   Home Layout: Multi-level;Able to live on main level with bedroom/bathroom Home Equipment: None       Prior Function Prior Level of Function : Independent/Modified Independent             Mobility Comments: No AD. No falls. Walks 1 mile at least with husband daily ADLs Comments: spouse does all IADL's. pt performes ADL's on her own     Hand Dominance        Extremity/Trunk Assessment   Upper Extremity Assessment Upper Extremity Assessment: LUE deficits/detail LUE Deficits / Details: slight difference in coordination compared with R    Lower Extremity Assessment Lower Extremity Assessment: LLE deficits/detail LLE Deficits / Details: hip flexion strength 3+/5, mild decreased coordination compared with R       Communication   Communication: No difficulties  Cognition Arousal/Alertness: Awake/alert Behavior During Therapy: WFL for tasks assessed/performed Overall Cognitive Status: Impaired/Different from baseline Area of Impairment: Orientation, Memory, Following commands, Safety/judgement, Awareness, Problem solving                 Orientation Level: Disoriented to, Time, Situation, Place   Memory: Decreased short-term memory (some present at baseline) Following Commands: Follows one step commands consistently, Follows one step commands with increased time Safety/Judgement: Decreased awareness of safety, Decreased awareness of deficits Awareness: Intellectual Problem Solving: Slow processing, Decreased initiation          General Comments General comments (skin integrity, edema, etc.): VSS, Spouse in the room to give history and supporting pt    Exercises     Assessment/Plan    PT Assessment Patient needs continued PT services  PT Problem List Decreased strength;Decreased balance;Decreased cognition;Decreased mobility;Decreased activity tolerance       PT Treatment Interventions DME instruction;Functional mobility training;Balance training;Modalities;Therapeutic activities;Gait training;Stair training;Cognitive remediation;Neuromuscular re-education     PT Goals (Current goals can be found in the Care Plan section)  Acute Rehab PT Goals Patient Stated Goal: to return home PT Goal Formulation: With patient/family Time For Goal Achievement: 08/31/22 Potential to Achieve Goals: Good    Frequency Min 4X/week     Co-evaluation               AM-PAC PT "6 Clicks" Mobility  Outcome Measure Help needed turning from your back to your side while in a flat bed without using bedrails?: A Little Help needed moving from lying on your back to sitting on the side of a flat bed without using bedrails?: A Little Help needed moving to and from a bed to a chair (including a wheelchair)?: A Little Help needed standing up from a chair using your arms (e.g., wheelchair or bedside chair)?: A Little Help needed to walk in hospital room?: Total Help needed climbing 3-5 steps with a railing? : Total 6 Click Score: 14    End of Session Equipment Utilized During Treatment: Gait belt Activity Tolerance: Patient tolerated treatment well Patient left: in chair;with chair alarm set;with family/visitor present Nurse Communication: Other (comment) (left off mitts, spouse beside pt in chair) PT Visit Diagnosis: Other abnormalities of gait and mobility (R26.89);Other symptoms and signs involving the nervous system (W09.811)    Time: 9147-8295 PT  Time Calculation (min) (ACUTE ONLY): 35 min   Charges:   PT Evaluation $PT Eval Moderate Complexity: 1 Mod PT Treatments $Therapeutic Activity: 8-22 mins        Sheran Lawless, PT Acute Rehabilitation Services Office:[phone] 08/17/2022   Elray Mcgregor 08/17/2022, 1:41 PM

## 2022-08-17 NOTE — Procedures (Addendum)
Patient Name: Anne Wolf  MRN: [id number]  Epilepsy Attending: Charlsie Quest  Referring Physician/Provider: Caryl Pina, MD  Duration: 08/17/2022 0310 to 08/17/2022 1148   Patient history: 89 year old female with a history of stroke and seizures who was found unresponsive at home by husband, who suspects that she had a breakthrough seizure. EEG to evaluate for seizure.   Level of alertness: awake, asleep   AEDs during EEG study: LEV   Technical aspects: This EEG study was done with scalp electrodes positioned according to the 10-20 International system of electrode placement. Electrical activity was reviewed with band pass filter of 1-70Hz , sensitivity of 7 uV/mm, display speed of 41mm/sec with a 60Hz  notched filter applied as appropriate. EEG data were recorded continuously and digitally stored.  Video monitoring was available and reviewed as appropriate.   Description:  EEG showed posterior dominant rhythm of 9Hz  activity of moderate voltage (25-35 uV) seen predominantly in posterior head regions, symmetric and reactive to eye opening and eye closing. Sleep was characterized by sleep spindles (12-14hz ), maximal fronto-central region. EEG also showed continuous 3 to 6 Hz theta-delta slowing in right temporal region. Hyperventilation and photic stimulation were not performed.      ABNORMALITY - Continuous slow, generalized and maximal right temporal   IMPRESSION: This study was suggestive of cortical dysfunction in right temporal region likely secondary to underlying structural abnormality/encephalomalacia. No seizures or epileptiform discharges were seen throughout the recording.   [name] [name]

## 2022-08-17 NOTE — TOC Benefit Eligibility Note (Signed)
Pharmacy Patient Advocate Encounter  Insurance verification completed.    The patient is insured through [employer] Medicare Part D  Ran test claim for Valtoco 15 mg Dose and the current 30 day co-pay is $95.00.   This test claim was processed through [facility]- copay amounts may vary at other pharmacies due to pharmacy/plan contracts, or as the patient moves through the different stages of their insurance plan.    Roland Earl, CPHT Pharmacy Patient Advocate Specialist [facility] Patient Advocate Team Direct Number: [phone]  Fax: [phone]

## 2022-08-17 NOTE — Progress Notes (Signed)
Pt heart rate sustaining in the 130's and CCMD reported a run of Vtach when standing for orthostatics.  No complaint of SOB, or chest pain.  Dr. Levon Hedger notify.  He is at the bedside.

## 2022-08-17 NOTE — Progress Notes (Signed)
eLink Physician-Brief Progress Note Patient Name: Anne Wolf DOB: November 09, 1933 MRN: [id number]   Date of Service  08/17/2022  HPI/Events of Note  Nurse reports poor urine output.  Patient had an incontinence episode earlier she was unclear how much she put out but on her most recent bladder scan, she does have 30 mL.  She has a PureWick in place.  eICU Interventions  On camera exam, she appears comfortable.  She is hemodynamically stable.  Will monitor her throughout the night and check her in the a.m. both clinically and with labs.  Any further decisions will depend on her status in the morning. Discussed with bedside nurse.     Intervention Category Minor Interventions: Other:  Carilyn Goodpasture 08/17/2022, 2:16 AM

## 2022-08-17 NOTE — Progress Notes (Signed)
   08/17/22 2134  Provider Notification  Provider Name/Title Dr Antionette Char  Date Provider Notified 08/17/22  Time Provider Notified 2135  Method of Notification Page  Notification Reason Critical Result (CBG read 58 at 2110, D50 25ml given, repeat CBG 151 at 2132)  Test performed and critical result CBG read 58 at 2100  Date Critical Result Received 08/17/22  Time Critical Result Received 2100  Provider response Other (Comment) (waiting on response)

## 2022-08-17 NOTE — ED Provider Notes (Signed)
[facility] NEURO/TRAUMA/SURGICAL ICU Provider Note   CSN: [id number] Arrival date & time: 08/15/22  1653     History  Chief Complaint  Patient presents with   Seizures    Anne Wolf is a 89 y.o. female.  Patient has a history of seizures.  Anne Wolf states that he came home and found Anne seizing.  He had last seen Anne 30 minutes prior to that.  Patient supposedly takes Keppra.  Paramedics gave the patient 5 mg of Versed and she continued to seize  The history is provided by a relative and the EMS personnel. No language interpreter was used.  Seizures Seizure activity on arrival: yes   Seizure type:  Grand mal Preceding symptoms comment:  Unknown Initial focality:  Diffuse Episode characteristics: abnormal movements   Postictal symptoms: confusion   Return to baseline: no   Severity:  Severe Timing:  Clustered Progression:  Improving      Home Medications Prior to Admission medications   Medication Sig Start Date End Date Taking? Authorizing Provider  alendronate (FOSAMAX) 70 MG tablet Take 70 mg by mouth once a week.   Yes [provider]  apixaban (ELIQUIS) 5 MG TABS tablet Take 1 tablet (5 mg total) by mouth 2 (two) times daily. 04/10/22   Windell Norfolk, MD  Cholecalciferol (VITAMIN D3) 125 MCG (5000 UT) capsule Take 5,000 Units by mouth daily.    [provider]  donepezil (ARICEPT) 5 MG tablet Take 1 tablet (5 mg total) by mouth at bedtime. 07/11/22   Windell Norfolk, MD  levETIRAcetam (KEPPRA) 500 MG tablet Take 1 tablet (500 mg total) by mouth 2 (two) times daily. 04/10/22 04/05/23  Windell Norfolk, MD  lisinopril (PRINIVIL,ZESTRIL) 10 MG tablet Take 10 mg by mouth at bedtime.    [provider]  metFORMIN (GLUCOPHAGE) 500 MG tablet Take 500 mg by mouth 2 (two) times daily with a meal.    [provider]  rosuvastatin (CRESTOR) 20 MG tablet Take 1 tablet (20 mg total) by mouth daily. 02/13/22   de Saintclair Halsted, Cortney E, NP       Allergies    Cat hair extract    Review of Systems   Review of Systems  Unable to perform ROS: Mental status change  Neurological:  Positive for seizures.    Physical Exam Updated Vital Signs BP 118/74   Pulse 98   Temp 98.2 F (36.8 C) (Oral)   Resp 20   Ht 5\' 1"  (1.549 m)   Wt 74.6 kg   SpO2 91%   BMI 31.08 kg/m  Physical Exam Vitals and nursing note reviewed.  Constitutional:      Appearance: She is well-developed.     Comments: Patient unresponsive and vomiting  HENT:     Head: Normocephalic.     Comments: Eyes midsize nonreactive    Nose: Nose normal.  Eyes:     General: No scleral icterus.    Conjunctiva/sclera: Conjunctivae normal.  Neck:     Thyroid: No thyromegaly.  Cardiovascular:     Rate and Rhythm: Normal rate and regular rhythm.     Heart sounds: No murmur heard.    No friction rub. No gallop.  Pulmonary:     Breath sounds: No stridor. No wheezing or rales.  Chest:     Chest wall: No tenderness.  Abdominal:     General: There is no distension.     Tenderness: There is no abdominal tenderness. There is no rebound.  Musculoskeletal:        General: Normal range of motion.     Cervical back: Neck supple.  Lymphadenopathy:     Cervical: No cervical adenopathy.  Skin:    Findings: No erythema or rash.  Neurological:     Motor: No abnormal muscle tone.     Coordination: Coordination normal.     Comments: Unresponsive and not responding to verbal stimuli     ED Results / Procedures / Treatments   Labs (all labs ordered are listed, but only abnormal results are displayed) Labs Reviewed  CBC WITH DIFFERENTIAL/PLATELET - Abnormal; Notable for the following components:      Result Value   WBC 13.1 (*)    Lymphs Abs 6.6 (*)    All other components within normal limits  COMPREHENSIVE METABOLIC PANEL - Abnormal; Notable for the following components:   CO2 12 (*)    Glucose, Bld 175 (*)    GFR, Estimated 56 (*)    Anion gap 26 (*)    All  other components within normal limits  URINALYSIS, ROUTINE W REFLEX MICROSCOPIC - Abnormal; Notable for the following components:   APPearance HAZY (*)    Hgb urine dipstick MODERATE (*)    Protein, ur >=300 (*)    Bacteria, UA RARE (*)    All other components within normal limits  LACTIC ACID, PLASMA - Abnormal; Notable for the following components:   Lactic Acid, Venous >9.0 (*)    All other components within normal limits  LACTIC ACID, PLASMA - Abnormal; Notable for the following components:   Lactic Acid, Venous 6.5 (*)    All other components within normal limits  BLOOD GAS, ARTERIAL - Abnormal; Notable for the following components:   pO2, Arterial 126 (*)    All other components within normal limits  LACTIC ACID, PLASMA - Abnormal; Notable for the following components:   Lactic Acid, Venous 3.6 (*)    All other components within normal limits  CBC - Abnormal; Notable for the following components:   WBC 15.3 (*)    Platelets 145 (*)    All other components within normal limits  BASIC METABOLIC PANEL - Abnormal; Notable for the following components:   Potassium 2.7 (*)    CO2 21 (*)    Glucose, Bld 149 (*)    Calcium 8.5 (*)    GFR, Estimated 59 (*)    All other components within normal limits  PHOSPHORUS - Abnormal; Notable for the following components:   Phosphorus 2.4 (*)    All other components within normal limits  GLUCOSE, CAPILLARY - Abnormal; Notable for the following components:   Glucose-Capillary 133 (*)    All other components within normal limits  GLUCOSE, CAPILLARY - Abnormal; Notable for the following components:   Glucose-Capillary 161 (*)    All other components within normal limits  GLUCOSE, CAPILLARY - Abnormal; Notable for the following components:   Glucose-Capillary 144 (*)    All other components within normal limits  GLUCOSE, CAPILLARY - Abnormal; Notable for the following components:   Glucose-Capillary 148 (*)    All other components within  normal limits  GLUCOSE, CAPILLARY - Abnormal; Notable for the following components:   Glucose-Capillary 131 (*)    All other components within normal limits  PROTIME-INR - Abnormal; Notable for the following components:   Prothrombin Time 17.6 (*)    INR 1.4 (*)    All other components within normal limits  RENAL FUNCTION PANEL - Abnormal; Notable  for the following components:   Glucose, Bld 111 (*)    Calcium 8.2 (*)    Phosphorus 2.4 (*)    Albumin 2.5 (*)    All other components within normal limits  CBG MONITORING, ED - Abnormal; Notable for the following components:   Glucose-Capillary 181 (*)    All other components within normal limits  I-STAT CHEM 8, ED - Abnormal; Notable for the following components:   Potassium 3.4 (*)    Glucose, Bld 170 (*)    TCO2 16 (*)    All other components within normal limits  CULTURE, BLOOD (ROUTINE X 2)  CULTURE, BLOOD (ROUTINE X 2)  MRSA NEXT GEN BY PCR, NASAL  RAPID URINE DRUG SCREEN, HOSP PERFORMED  MAGNESIUM  GLUCOSE, CAPILLARY  GLUCOSE, CAPILLARY  GLUCOSE, CAPILLARY  MAGNESIUM  GLUCOSE, CAPILLARY    EKG None  Radiology Overnight EEG with video  Result Date: 08/16/2022 Charlsie Quest, MD     08/16/2022 11:13 AM Patient Name: HERA CELAYA MRN: [id number] Epilepsy Attending: Charlsie Quest Referring Physician/Provider: Caryl Pina, MD Duration: 08/16/2022 0310 to 1100 Patient history: 89 year old female with a history of stroke and seizures who was found unresponsive at home by Wolf, who suspects that she had a breakthrough seizure. EEG to evaluate for seizure. Level of alertness:  comatose-->awake, asleep AEDs during EEG study: LEV, propofol Technical aspects: This EEG study was done with scalp electrodes positioned according to the 10-20 International system of electrode placement. Electrical activity was reviewed with band pass filter of 1-70Hz , sensitivity of 7 uV/mm, display speed of 30mm/sec with a 60Hz  notched filter  applied as appropriate. EEG data were recorded continuously and digitally stored.  Video monitoring was available and reviewed as appropriate. Description: At the beginning of the study, EEG showed continuous generalized 2-3 Hz delta slowing with overriding 15 to 18 Hz beta activity distributed symmetrically and diffusely. As sedation was weaned, EEG showed posterior dominant rhythm of 9Hz  activity of moderate voltage (25-35 uV) seen predominantly in posterior head regions, symmetric and reactive to eye opening and eye closing. Sleep was characterized by sleep spindles (12-14hz ), maximal fronto-central region. EEG also showed continuous 3 to 6 Hz theta-delta slowing in right temporal region. Hyperventilation and photic stimulation were not performed.   ABNORMALITY - Continuous slow, generalized and maximal right temporal IMPRESSION: This study was initially suggestive of severe diffuse encephalopathy likely related to sedation. As sedation was weaned, EEG improved and was suggestive of cortical dysfunction in right temporal region likely secondary to underlying structural abnormality/encephalomalacia. No seizures or epileptiform discharges were seen throughout the recording. Charlsie Quest   DG Chest Port 1 View  Result Date: 08/16/2022 CLINICAL DATA:  89 year old female with history of seizure and stroke status post intubation. EXAM: PORTABLE CHEST 1 VIEW COMPARISON:  Chest x-ray 08/15/2022. FINDINGS: An endotracheal tube is in place with tip 3.8 cm above the carina. A nasogastric tube is seen extending into the stomach, however, the tip of the nasogastric tube extends below the lower margin of the image. Lung volumes are normal. Ill-defined opacity throughout the central aspect of the right mid to lower lung, concerning for developing pneumonia or sequela of aspiration. Bibasilar opacities are also noted, which suggest subsegmental atelectasis. Small bilateral pleural effusions. No pneumothorax. No evidence  of pulmonary edema. Heart size is mildly enlarged. Upper mediastinal contours are within normal limits. Atherosclerotic calcifications in the thoracic aorta. Status post TAVR. IMPRESSION: 1. Support apparatus, as above. 2. Worsening bibasilar  areas of subsegmental atelectasis with superimposed small bilateral pleural effusions. There also appears to be extensive airspace consolidation developing throughout the central aspect of the right mid to lower lung concerning for developing pneumonia or sequela of recent aspiration. 3. Mild cardiomegaly. Electronically Signed   By: Trudie Reed M.D.   On: 08/16/2022 05:33   CT Head Wo Contrast  Result Date: 08/15/2022 CLINICAL DATA:  Seizure. EXAM: CT HEAD WITHOUT CONTRAST TECHNIQUE: Contiguous axial images were obtained from the base of the skull through the vertex without intravenous contrast. RADIATION DOSE REDUCTION: This exam was performed according to the departmental dose-optimization program which includes automated exposure control, adjustment of the mA and/or kV according to patient size and/or use of iterative reconstruction technique. COMPARISON:  Brain MRI dated 02/11/2022. FINDINGS: Brain: Mild age-related atrophy and chronic microvascular ischemic changes. Right basal ganglia old lacunar infarct and encephalomalacia. There is no acute intracranial hemorrhage. No mass effect or midline shift. No extra-axial fluid collection. Vascular: No hyperdense vessel or unexpected calcification. Skull: Normal. Negative for fracture or focal lesion. Sinuses/Orbits: Mild mucoperiosteal thickening of paranasal sinuses. No air-fluid level. The mastoid air cells are clear. Other: None IMPRESSION: 1. No acute intracranial pathology. 2. Mild age-related atrophy and chronic microvascular ischemic changes. Right basal ganglia old lacunar infarct and encephalomalacia. Electronically Signed   By: Elgie Collard M.D.   On: 08/15/2022 18:08   DG Chest Portable 1  View  Result Date: 08/15/2022 CLINICAL DATA:  verify OG tube placement EXAM: PORTABLE CHEST - 1 VIEW COMPARISON:  08/15/2022 FINDINGS: Endotracheal tube tip less than 2 cm above carina directed towards the right mainstem bronchus. Gastric tube extends at least as far as the stomach. Low lung volumes with some increase in perihilar infiltrates or edema. Heart size and mediastinal contours are within normal limits. Post TAVR. No effusion. Visualized bones unremarkable. IMPRESSION: 1. Endotracheal tube tip less than 2 cm above carina directed towards the right mainstem bronchus, consider retracting at least 2 cm. 2. Gastric tube extends at least as far as the stomach. 3. Low lung volumes with some increase in perihilar infiltrates or edema. Electronically Signed   By: Corlis Leak M.D.   On: 08/15/2022 18:05   DG Chest Portable 1 View  Result Date: 08/15/2022 CLINICAL DATA:  Intubated EXAM: PORTABLE CHEST 1 VIEW COMPARISON:  08/19/2021 FINDINGS: Single frontal view of the chest demonstrates endotracheal tube overlying tracheal air column, tip 1.3 cm above carina. Cardiac silhouette is stable. Aortic valve prosthesis unchanged. No airspace disease, effusion, or pneumothorax. No acute bony abnormalities. IMPRESSION: 1. No complication after intubation. Endotracheal tube tip 1.3 cm above carina. 2. No acute airspace disease. Electronically Signed   By: Sharlet Salina M.D.   On: 08/15/2022 17:33    Procedures Procedure Name: Intubation Date/Time: 08/17/2022 10:53 AM  Performed by: Bethann Berkshire, MDPre-anesthesia Checklist: Patient identified, Patient being monitored, Emergency Drugs available, Timeout performed and Suction available Oxygen Delivery Method: Non-rebreather mask Preoxygenation: Pre-oxygenation with 100% oxygen Induction Type: Rapid sequence Ventilation: Mask ventilation without difficulty Placement Confirmation: ETT inserted through vocal cords under direct vision, CO2 detector and Breath  sounds checked- equal and bilateral Comments: Patient given etomidate and succinylcholine.  She was intubated with a 7 and half ET tube on the first try.  Confirmation was made by seeing the tube go through the cords along with auscultation and CO2 return        Medications Ordered in ED Medications  0.9 %  sodium chloride infusion ( Intravenous  Restarted 08/17/22 0745)  Chlorhexidine Gluconate Cloth 2 % PADS 6 each (6 each Topical Given 08/17/22 0941)  docusate (COLACE) 50 MG/5ML liquid 100 mg (has no administration in time range)  polyethylene glycol (MIRALAX / GLYCOLAX) packet 17 g (has no administration in time range)  famotidine (PEPCID) tablet 20 mg (20 mg Per Tube Not Given 08/17/22 0013)  0.9 %  sodium chloride infusion ( Intravenous Stopped 08/16/22 1557)  norepinephrine (LEVOPHED) 4mg  in (0.016 mg/mL) premix infusion (5 mcg/min Intravenous Infusion Verify 08/16/22 1349)  insulin aspart (novoLOG) injection 0-9 Units ( Subcutaneous Not Given 08/17/22 0851)  LORazepam (ATIVAN) injection 2 mg (has no administration in time range)  levETIRAcetam (KEPPRA) IVPB 500 mg/100 mL premix (0 mg Intravenous Stopped 08/17/22 0450)  acetaminophen (TYLENOL) tablet 650 mg (650 mg Per NG tube Given 08/16/22 0428)  Ampicillin-Sulbactam (UNASYN) 3 g in sodium chloride 0.9 % 100 mL IVPB (3 g Intravenous New Bag/Given 08/17/22 0703)  Oral care mouth rinse (15 mLs Mouth Rinse Given 08/17/22 0850)  Oral care mouth rinse (has no administration in time range)  apixaban (ELIQUIS) tablet 5 mg (5 mg Oral Given 08/17/22 0941)  metoprolol tartrate (LOPRESSOR) injection 2.5-5 mg (has no administration in time range)  potassium PHOSPHATE 15 mmol in dextrose 5 % 250 mL infusion (15 mmol Intravenous New Bag/Given 08/17/22 1027)  levETIRAcetam (KEPPRA) IVPB 1000 mg/100 mL premix (0 mg Intravenous Stopped 08/15/22 1748)  succinylcholine (ANECTINE) 200 MG/10ML syringe (120 mg  Given 08/15/22 1704)  etomidate (AMIDATE) 2  MG/ML injection (20 mg  Given 08/15/22 1704)  LORazepam (ATIVAN) injection 2 mg (2 mg Intravenous Given 08/15/22 1700)  sodium chloride 0.9 % bolus 1,000 mL (0 mLs Intravenous Stopped 08/15/22 1950)  sodium chloride 0.9 % bolus 1,000 mL (0 mLs Intravenous Stopped 08/15/22 1950)  acetaminophen (TYLENOL) suppository 650 mg (650 mg Rectal Given 08/15/22 2058)  Ampicillin-Sulbactam (UNASYN) 3 g in sodium chloride 0.9 % 100 mL IVPB (0 g Intravenous Stopped 08/15/22 2149)  potassium chloride (KLOR-CON) packet 40 mEq (40 mEq Per Tube Given 08/16/22 0605)  potassium chloride 10 mEq in 100 mL IVPB (0 mEq Intravenous Stopped 08/16/22 1023)  magnesium sulfate IVPB 2 g 50 mL (0 g Intravenous Stopped 08/16/22 0712)  magnesium sulfate IVPB 2 g 50 mL (2 g Intravenous New Bag/Given 08/17/22 0946)    ED Course/ Medical Decision Making/ A&P  CRITICAL CARE Performed by: Bethann Berkshire Total critical care time: 45 minutes Critical care time was exclusive of separately billable procedures and treating other patients. Critical care was necessary to treat or prevent imminent or life-threatening deterioration. Critical care was time spent personally by me on the following activities: development of treatment plan with patient and/or surrogate as well as nursing, discussions with consultants, evaluation of patient's response to treatment, examination of patient, obtaining history from patient or surrogate, ordering and performing treatments and interventions, ordering and review of laboratory studies, ordering and review of radiographic studies, pulse oximetry and re-evaluation of patient's condition.   Patient was given Ativan and Keppra for seizures.  She was not protecting Anne airway so she had to be intubated.  Patient was given succinylcholine and etomidate                           Medical Decision Making Amount and/or Complexity of Data Reviewed Labs: ordered. Radiology: ordered. ECG/medicine tests:  ordered.  Risk OTC drugs. Prescription drug management. Decision regarding hospitalization.  This patient presents to  the ED for concern of seizures, this involves an extensive number of treatment options, and is a complaint that carries with it a high risk of complications and morbidity.  The differential diagnosis includes stroke, seizures   Co morbidities that complicate the patient evaluation  Seizures   Additional history obtained:  Additional history obtained from Wolf External records from outside source obtained and reviewed including hospital records   Lab Tests:  I Ordered, and personally interpreted labs.  The pertinent results include: Lactic greater than 9   Imaging Studies ordered:  I ordered imaging studies including CT head and chest x-ray I independently visualized and interpreted imaging which showed CT shows no acute disease, chest x-ray shows proper placement of ET tube I agree with the radiologist interpretation   Cardiac Monitoring: / EKG:  The patient was maintained on a cardiac monitor.  I personally viewed and interpreted the cardiac monitored which showed an underlying rhythm of: Sinus rhythm   Consultations Obtained:  I requested consultation with the local care,  and discussed lab and imaging findings as well as pertinent plan - they recommend: Admit to [facility]   Problem List / ED Course / Critical interventions / Medication management  Status epilepticus and aspiration I ordered medication including antibiotics for aspiration Reevaluation of the patient after these medicines showed that the patient stayed the same I have reviewed the patients home medicines and have made adjustments as needed   Social Determinants of Health:  None   Test / Admission - Considered:  None  Patient with status epilepticus and possible aspiration pneumonia.  She is admitted to [facility] intensive care unit and Dr. Katrinka Blazing is  excepting        Final Clinical Impression(s) / ED Diagnoses Final diagnoses:  Status epilepticus [facility])    Rx / DC Orders ED Discharge Orders     None         Bethann Berkshire, MD 08/17/22 1056

## 2022-08-17 NOTE — Progress Notes (Signed)
LTM EEG disconnected - no skin breakdown at unhook. [facility] notified.  

## 2022-08-17 NOTE — Progress Notes (Signed)
vLTM maintenance  All impedances below 10k.   No skin breakdown noted at FP1  FP2  FZ   F4  F3

## 2022-08-17 NOTE — Progress Notes (Signed)
NAME:  Anne Wolf, MRN:  [id number], DOB:  08/22/1933, LOS: 2 ADMISSION DATE:  08/15/2022, CONSULTATION DATE:  08/16/2022 REFERRING MD:  Estell Harpin - [facility] EDP CHIEF COMPLAINT:  Status epilepticus   History of Present Illness:  89 year old woman with PMH as below, which is significant for stroke, seizure, Atrial fib, DM, and aortic stenosis. She presented to [facility] ED 6/25 after being found down by her husband at home. He describes some failure to thrive over the past few months. She has been more lethargic, not eating or drinking, and having dizzy spells. He found her down and unresponsive and called EMS. In the ED there were convulsions seen in the left upper extremity. The patient had no return to baseline for 40 minutes and she was intubated for airway protection. Head CT negative. Keppra for seizure. Transferred to [facility] neuro ICU for critical care admission and EEG monitoring.   Pertinent Medical History:   Past Medical History:  Diagnosis Date   Aortic stenosis    mild; mild MR; slightly increased pulmonary artery pressure with mild RVH; normal LV-2010   Arthritis    Atrial fibrillation (HCC)    chronic anticoagulation; adequate HR control on minimal AV nodal blocking medication    Cancer (HCC)    skin cancers removed from face   CHF (congestive heart failure) (HCC)    Diabetes mellitus without complication (HCC)    2   Dyspnea    Dysrhythmia    Fasting hyperglycemia    + microalbuminuria; A1c of 6.5% in 11/2010   Hematochezia 01/2009   01/2009-presumed ischemic colitis; history of diverticulosis   History of kidney stones    Hx of adenomatous colonic polyps 2005   adenomatous polyp; negative colonoscopy in 2009   Hyperlipidemia    Seizure (HCC) 01/2022   Stroke (HCC)    Significant Hospital Events: Including procedures, antibiotic start and stop dates in addition to other pertinent events   6/26 - Presented to [facility] as transfer from [facility] for status epilepticus. Intubated  at [facility] for airway protection. LTM EEG, Prop for burst suppression.  Extubated in afternoon  Interim History / Subjective:  Remains on cEEG, no events overnight Extubated 6/26, on room air Remains confused Passed bedside swallow screen  No complaints per pt Afebrile  Objective:  Blood pressure 111/64, pulse 92, temperature 98.9 F (37.2 C), temperature source Axillary, resp. rate (!) 22, height 5\' 1"  (1.549 m), weight 74.6 kg, SpO2 96 %.    FiO2 (%):  [28 %] 28 %   Intake/Output Summary (Last 24 hours) at 08/17/2022 0747 Last data filed at 08/17/2022 0600 Gross per 24 hour  Intake 2545.28 ml  Output 940 ml  Net 1605.28 ml   Filed Weights   08/15/22 1900 08/16/22 0701 08/17/22 0500  Weight: 69.6 kg 74.6 kg 74.6 kg   Physical Examination: General:  chronically ill appearing elderly female sitting  upright in bed in NAD on cEEG HEENT: MM pink/moist, no oral bleeding, pupils 3/r, anicteric  Neuro: awake, oriented to person, hospital, otherwise confused, MAE, follows simple comands CV: afib, IRIR, no murmur PULM:  non labored, clear GI: soft, bs+, NT, purwick  Extremities: warm/dry, no LE edema  Skin: no rashes   Afebrile Labs > pending Decreased UOP overnight ~0.1 Net +4L  Resolved Hospital Problem List:     Assessment & Plan:   Status epilepticus History of seizures beginning in 2022, ?breakthrough. CT Head 6/26 NAICA, mild age-related atrophy/chronic microvascular ischemic changes, old R basal  ganglia lacunar infarct. - Neuro following, appreciate input - remains on LTM - AEDs per Neuro> keppra, prn ativan - MRI brain when able, remains confused, not sure she would tolerate lying still  - Seizure precautions - Neuroprotective measures: HOB > 30 degrees, normoglycemia, normothermia, electrolytes WNL - consider transfer out of ICU if ok with Neuro  Acute hypoxemic respiratory failure secondary to obtundation Concern for aspiration PNA - extubated 6/26, doing well.  On room air - prn supplemental O2  - aggressive pulm hygiene - cont unasyn for aspiration coverage, day 3/5 - intermittent CXR - passed bedside swallow, start diet, advance as tolerated    Atrial fibrillation, controlled rate - reported oral bleeding on admission, AC held.  No s/s of bleeding, will resume eliquis - not on rate control meds pta.  Remains in afib, rate < 110 - prn lopressor if sustained > 120 - goal K> 4, Mag > 2  Hypotension, likely sedation-related Lactic acidosis secondary to seizure, ?infection - Goal MAP > 65 - off pressors - cont to hold home hypertensive med/ lisinopril> currently not needed  - F/u Cx data - trend CBC/ fever curve   Hypokalemia Hypomagnesemia - pending labs - Replete electrolytes as indicated - trend  I&Os - bladder scan to monitor for retention   Alzheimer's dementia - resume home Aricept   DM - SSI - CBGs Q4H - Goal CBG 140-180 - Hold home metformin  Goals of care: Per H&P 6/26 "Spoke with husband who described her recent decline. She has been clear in the past she wouldn't want to be on a machine, but he is hopeful this is reversible. I explained it is entirely possible she may not have a good recovery from this considering her baseline dementia and now prolonged status. He would like to place DNR and give her approximately 48 hours to mount a recovery."   Best Practice (right click and "Reselect all SmartList Selections" daily)   Diet/type: NPO> advance as tolerated  DVT prophylaxis: DOAC GI prophylaxis: H2B Lines: N/A Foley:  N/A Code Status:  DNR ok for intubation Last date of multidisciplinary goals of care discussion [6/26AM as above - DNR, time-limited trial of MV ]  Pending, no family at bedside 6/27  Critical care time:      Posey Boyer, NP [facility] 08/17/22 7:47 AM  Please see [email or web address] for pager details.  From 7A-7P if no response, please call [phone] After hours, please  call ELink [phone]

## 2022-08-17 NOTE — Progress Notes (Signed)
Some increased dizziness and ectopy on standing. HR Afib mild RVR persistent Exam benign, denies pain/anxiety BP ok Maybe slightly dry? Will give some LR and start a PO BB.  Myrla Halsted MD PCCM

## 2022-08-17 NOTE — Progress Notes (Signed)
E link called for concerns of dehydration; Pt had an unknown amount of UO prior to shift from a wet bed; For the first 6 hours of this RNs shift pt has not had any more UO; Bladder scan shows 28 mL; Awaiting orders at this time

## 2022-08-17 NOTE — Progress Notes (Signed)
CCMD reported at 3.70 sec run of Vtach.  Selmer Dominion, NP reponded.  No change in status of patient.  No new orders at this time.

## 2022-08-17 NOTE — Progress Notes (Signed)
Subjective: NAEO. Reports mediation compliance without any side effects. Reports feeling dizzy described as feeling imbalanced and about to fall while walking for 3-4 weeks. Husband also reports she doesn't eat much but denies any weight loss.   ROS: negative except above Examination  Vital signs in last 24 hours: Temp:  [97.9 F (36.6 C)-99.9 F (37.7 C)] 97.9 F (36.6 C) (06/27 1100) Pulse Rate:  [74-114] 110 (06/27 1200) Resp:  [12-28] 24 (06/27 1200) BP: (74-145)/(47-88) 133/63 (06/27 1200) SpO2:  [91 %-99 %] 97 % (06/27 1200) Weight:  [74.6 kg] 74.6 kg (06/27 0500)  General: sitting in chair, NAD Neuro: MS: Alert, oriented, follows commands CN: pupils equal and reactive,  EOMI, face symmetric, tongue midline, normal sensation over face, Motor: 5/5 strength in RUE/RLE, 4/5 in LUE/LLE Coordination: normal Gait: not tested  Basic Metabolic Panel: Recent Labs  Lab 08/15/22 1704 08/15/22 1705 08/16/22 0434 08/17/22 0825  NA 142 140 140 139  K 3.4* 3.5 2.7* 3.6  CL 105 102 106 108  CO2  --  12* 21* 22  GLUCOSE 170* 175* 149* 111*  BUN 11 13 11 11   CREATININE 0.80 0.99 0.94 0.60  CALCIUM  --  9.6 8.5* 8.2*  MG  --   --  1.7 1.8  PHOS  --   --  2.4* 2.4*    CBC: Recent Labs  Lab 08/15/22 1704 08/15/22 1705 08/16/22 0434  WBC  --  13.1* 15.3*  NEUTROABS  --  5.4  --   HGB 15.0 14.1 12.5  HCT 44.0 44.7 38.3  MCV  --  93.3 87.6  PLT  --  187 145*     Coagulation Studies: Recent Labs    08/17/22 0825  LABPROT 17.6*  INR 1.4*    Imaging CTH wo contrast 08/15/2022: No acute intracranial pathology. Mild age-related atrophy and chronic microvascular ischemic changes. Right basal ganglia old lacunar infarct and encephalomalacia.   ASSESSMENT AND PLAN: 89 year old female with a history of stroke and seizures who was found unresponsive at home by husband, who suspects that she had a breakthrough seizure. She was intubated on arrival and started on LTM EEG.    Epilepsy with breakthrough seizure - No clear etiology  Recommendations - Increase Keppra to 750mg  BID, switched to PO - Rescue medications: Intranasal valtoco 15mg  for seizure lasting over 2 minutes. Copy is $95. If not affordable, can prescribe Tab Clonazepam 2mg  ODT instead - Continue seizure precautions -As needed IV Versed for clinical seizures -Management of rest of comorbidities per primary team -Discussed plan with Dr. via secure chat - F/u with Dr Teresa Coombs on 8/26/024 at 1115 already scheduled  Dizziness - Ddx include orthostatic hypotension, peripheral neuropathy, deconditioning vs less likely BPPV - will get MRI brain wo contrast to look for any acute abnormality - Will check orthostatic vital signs - PT/OT - May benefit from Epley's maneuver if MRI negative - may need NCS as outpatient to assess for neuropathy - also discussed about potentially considering meds to help with appetite if patient starts losing weight  I have spent a total of  40  minutes with the patient reviewing hospital notes,  test results, labs and examining the patient as well as establishing an assessment and plan that was discussed personally with the patient.  > 50% of time was spent in direct patient care.   Lindie Spruce Epilepsy [facility] For questions after 5pm please refer to AMION to reach the Neurologist on call

## 2022-08-17 NOTE — Progress Notes (Signed)
Pt transferred to room 3W12 from 4N Icu at this time.  Pt alert and oriented to self, place, and situation; disoriented to time and date.  Husband at bedside.  Telemetry placed on patient, CCMD called and second verification completed. Call bell within reach and bed alarm set.

## 2022-08-18 DIAGNOSIS — Z515 Encounter for palliative care: Secondary | ICD-10-CM | POA: Diagnosis not present

## 2022-08-18 DIAGNOSIS — Z7189 Other specified counseling: Secondary | ICD-10-CM

## 2022-08-18 DIAGNOSIS — F0390 Unspecified dementia without behavioral disturbance: Secondary | ICD-10-CM

## 2022-08-18 DIAGNOSIS — J690 Pneumonitis due to inhalation of food and vomit: Secondary | ICD-10-CM | POA: Diagnosis not present

## 2022-08-18 DIAGNOSIS — G40901 Epilepsy, unspecified, not intractable, with status epilepticus: Secondary | ICD-10-CM | POA: Diagnosis not present

## 2022-08-18 DIAGNOSIS — J9601 Acute respiratory failure with hypoxia: Secondary | ICD-10-CM | POA: Diagnosis not present

## 2022-08-18 LAB — CBC
HCT: 32.4 % — ABNORMAL LOW (ref 36.0–46.0)
Hemoglobin: 10.7 g/dL — ABNORMAL LOW (ref 12.0–15.0)
MCH: 29.8 pg (ref 26.0–34.0)
MCHC: 33 g/dL (ref 30.0–36.0)
MCV: 90.3 fL (ref 80.0–100.0)
Platelets: 108 10*3/uL — ABNORMAL LOW (ref 150–400)
RBC: 3.59 MIL/uL — ABNORMAL LOW (ref 3.87–5.11)
RDW: 14.5 % (ref 11.5–15.5)
WBC: 11.3 10*3/uL — ABNORMAL HIGH (ref 4.0–10.5)
nRBC: 0 % (ref 0.0–0.2)

## 2022-08-18 LAB — RENAL FUNCTION PANEL
Albumin: 2.5 g/dL — ABNORMAL LOW (ref 3.5–5.0)
Anion gap: 14 (ref 5–15)
BUN: 10 mg/dL (ref 8–23)
CO2: 24 mmol/L (ref 22–32)
Calcium: 8.3 mg/dL — ABNORMAL LOW (ref 8.9–10.3)
Chloride: 101 mmol/L (ref 98–111)
Creatinine, Ser: 0.63 mg/dL (ref 0.44–1.00)
GFR, Estimated: >60 mL/min (ref >60)
Glucose, Bld: 109 mg/dL — ABNORMAL HIGH (ref 70–99)
Phosphorus: 2.5 mg/dL (ref 2.5–4.6)
Potassium: 4 mmol/L (ref 3.5–5.1)
Sodium: 139 mmol/L (ref 135–145)

## 2022-08-18 LAB — GLUCOSE, CAPILLARY
Glucose-Capillary: 100 mg/dL — ABNORMAL HIGH (ref 70–99)
Glucose-Capillary: 122 mg/dL — ABNORMAL HIGH (ref 70–99)
Glucose-Capillary: 123 mg/dL — ABNORMAL HIGH (ref 70–99)
Glucose-Capillary: 124 mg/dL — ABNORMAL HIGH (ref 70–99)
Glucose-Capillary: 132 mg/dL — ABNORMAL HIGH (ref 70–99)
Glucose-Capillary: 151 mg/dL — ABNORMAL HIGH (ref 70–99)
Glucose-Capillary: 58 mg/dL — ABNORMAL LOW (ref 70–99)
Glucose-Capillary: 91 mg/dL (ref 70–99)

## 2022-08-18 LAB — CULTURE, BLOOD (ROUTINE X 2): Special Requests: ADEQUATE

## 2022-08-18 MED ORDER — INSULIN ASPART 100 UNIT/ML IJ SOLN: 0.0000 [IU] | Freq: Every day | INTRAMUSCULAR | Status: DC

## 2022-08-18 MED ORDER — INSULIN ASPART 100 UNIT/ML IJ SOLN
0.0000 [IU] | Freq: Three times a day (TID) | INTRAMUSCULAR | Status: DC
  Administered 2022-08-18 – 2022-08-21 (×5): 1 [IU] via SUBCUTANEOUS

## 2022-08-18 MED ORDER — SENNOSIDES-DOCUSATE SODIUM 8.6-50 MG PO TABS
1.0000 | ORAL_TABLET | Freq: Two times a day (BID) | ORAL | Status: DC
  Administered 2022-08-18 – 2022-08-22 (×8): 1 via ORAL
  Filled 2022-08-18 (×8): qty 1

## 2022-08-18 MED ORDER — METOPROLOL TARTRATE 5 MG/5ML IV SOLN: 2.5000 mg | Freq: Four times a day (QID) | INTRAVENOUS | Status: DC | PRN

## 2022-08-18 NOTE — Progress Notes (Signed)
Physical Therapy Treatment Patient Details Name: Anne Wolf MRN: [id number] DOB: 10-16-1933 Today's Date: 08/18/2022   History of Present Illness Anne Wolf is an 89 y.o. female admitted 08/15/22 with seizure. Found down at home by spouse unresponsive.  Intubated on admission and extubated 08/16/22. Started on LTM EEG and head CT negative for acute changes.  PMH: afib on Coumadin, DM, HLD, CVA, cancer, CHF, aortic stenosis.    PT Comments    Pt greeted resting in bed and agreeable to session with continued progress towards acute goals. Pt continues to be limited by impaired cognition, decreased activity tolerance and impaired balance/postural reactions. Pt needing min guard assist to come to sit EOB with cues for posture as pt with tendency for posterior lean in sitting and standing. Pt needing min A to power up to stand and correct posterior lean. Pt able to progress gait this session with RW support and min A to steady throughout with cues for RW proximity and management. Spouse present at bedside and supportive. Current plan remains appropriate to address deficits and maximize functional independence and decrease caregiver burden. Pt continues to benefit from skilled PT services to progress toward functional mobility goals.     Recommendations for follow up therapy are one component of a multi-disciplinary discharge planning process, led by the attending physician.  Recommendations may be updated based on patient status, additional functional criteria and insurance authorization.  Follow Up Recommendations       Assistance Recommended at Discharge Intermittent Supervision/Assistance  Patient can return home with the following A little help with walking and/or transfers;A little help with bathing/dressing/bathroom;Direct supervision/assist for medications management;Assist for transportation;Help with stairs or ramp for entrance;Assistance with cooking/housework   Equipment  Recommendations  Other (comment) (TBA)    Recommendations for Other Services       Precautions / Restrictions Precautions Precautions: Fall Precaution Comments: seizure, watch HR Restrictions Weight Bearing Restrictions: No     Mobility  Bed Mobility Overal bed mobility: Needs Assistance Bed Mobility: Supine to Sit     Supine to sit: HOB elevated, Min guard     General bed mobility comments: min guard for safety, cues to scoot out to EOB    Transfers Overall transfer level: Needs assistance Equipment used: Rolling walker (2 wheels) Transfers: Sit to/from Stand, Bed to chair/wheelchair/BSC Sit to Stand: Min assist           General transfer comment: min A to power up to RW x2 throughuot session    Ambulation/Gait Ambulation/Gait assistance: Min assist Gait Distance (Feet): 32 Feet Assistive device: Rolling walker (2 wheels) Gait Pattern/deviations: Step-through pattern, Decreased stride length, Shuffle Gait velocity: decr     General Gait Details: min A to steady and manage RW, cues to move close to RW, HR up to 132bpm non sustained Afib during activity   Stairs             Wheelchair Mobility    Modified Rankin (Stroke Patients Only)       Balance Overall balance assessment: Needs assistance Sitting-balance support: Bilateral upper extremity supported Sitting balance-Leahy Scale: Fair Sitting balance - Comments: intermittent posterior lean needing cues to correct   Standing balance support: Bilateral upper extremity supported Standing balance-Leahy Scale: Poor Standing balance comment: BUE support needed, posterior lean needing asssit to cues to correct                            Cognition Arousal/Alertness:  Awake/alert Behavior During Therapy: WFL for tasks assessed/performed Overall Cognitive Status: Impaired/Different from baseline Area of Impairment: Orientation, Memory, Following commands, Safety/judgement, Awareness,  Problem solving                 Orientation Level: Disoriented to, Time, Situation, Place   Memory: Decreased short-term memory (some present at baseline) Following Commands: Follows one step commands consistently, Follows one step commands with increased time Safety/Judgement: Decreased awareness of safety, Decreased awareness of deficits Awareness: Intellectual Problem Solving: Slow processing, Decreased initiation          Exercises      General Comments General comments (skin integrity, edema, etc.): HR in Afib rhythm up to 132 bpm with mobility, quickly resolving with seated rest      Pertinent Vitals/Pain Pain Assessment Pain Assessment: No/denies pain    Home Living                          Prior Function            PT Goals (current goals can now be found in the care plan section) Acute Rehab PT Goals Patient Stated Goal: to return home PT Goal Formulation: With patient/family Time For Goal Achievement: 08/31/22 Progress towards PT goals: Progressing toward goals    Frequency    Min 4X/week      PT Plan      Co-evaluation              AM-PAC PT "6 Clicks" Mobility   Outcome Measure  Help needed turning from your back to your side while in a flat bed without using bedrails?: A Little Help needed moving from lying on your back to sitting on the side of a flat bed without using bedrails?: A Little Help needed moving to and from a bed to a chair (including a wheelchair)?: A Little Help needed standing up from a chair using your arms (e.g., wheelchair or bedside chair)?: A Little Help needed to walk in hospital room?: A Lot Help needed climbing 3-5 steps with a railing? : Total 6 Click Score: 15    End of Session Equipment Utilized During Treatment: Gait belt Activity Tolerance: Patient tolerated treatment well Patient left: in chair;with family/visitor present;with call bell/phone within reach Nurse Communication: Mobility  status;Other (comment) (left off mitts, spouse beside pt in chair, HR) PT Visit Diagnosis: Other abnormalities of gait and mobility (R26.89);Other symptoms and signs involving the nervous system (R29.898)     Time: 4401-0272 PT Time Calculation (min) (ACUTE ONLY): 35 min  Charges:  $Gait Training: 8-22 mins $Therapeutic Activity: 8-22 mins                     Depaul Arizpe R. PTA Acute Rehabilitation Services Office: [phone]   Catalina Antigua 08/18/2022, 12:32 PM

## 2022-08-18 NOTE — Inpatient Diabetes Management (Signed)
Inpatient Diabetes Program Recommendations  AACE/ADA: New Consensus Statement on Inpatient Glycemic Control (2015)  Target Ranges:  Prepandial:   less than 140 mg/dL      Peak postprandial:   less than 180 mg/dL (1-2 hours)      Critically ill patients:  140 - 180 mg/dL   Lab Results  Component Value Date   GLUCAP 91 08/18/2022   HGBA1C 6.4 (H) 04/25/2022    Review of Glycemic Control  Latest Reference Range & Units 08/17/22 08:03 08/17/22 11:42 08/17/22 16:22 08/17/22 21:10 08/17/22 21:32 08/18/22 00:18 08/18/22 04:37 08/18/22 07:48  Glucose-Capillary 70 - 99 mg/dL 99 956 (H) 387 (H) 58 (L) 151 (H) 122 (H) 100 (H) 91   Diabetes history: DM 2 Outpatient Diabetes medications: Metformin 500 mg bid Current orders for Inpatient glycemic control:  Novolog 0-9 units Q4  A1c 6.4% 3/5 Note hypoglycemia 58 yesterday after Novolog Correction scale dose  Inpatient Diabetes Program Recommendations:    -  reduce Novolog Correction scale to "very sensitive" 0-6 units tid + hs  Thanks,  Christena Deem RN, MSN, BC-ADM Inpatient Diabetes Coordinator Team Pager [phone] (8a-5p)

## 2022-08-18 NOTE — Hospital Course (Addendum)
Brief hospital course: PMH of CVA, seizure, and A-fib, type II DM, aortic stenosis present to the [facility], ED as she was found down by her husband at home.  While in the ED patient had seizures of left upper extremity and she was intubated for airway protection. Transferred to [facility] for ICU care and EEG monitoring. Neurology was consulted. Transfer out of the ICU on 6/28. Assessment and Plan: Status epilepticus. History of seizures. Presents with witnessed seizures and unwitnessed fall. Neurology was consulted. Underwent LTM EEG monitoring. Currently on Keppra. No further seizures. No further change in therapy recommended by neurology.  Acute CVA. Awaiting guidance from neurology although personally discussed with the neurologist on 6/28. Suspect these are incidental findings and does not explain patient presentation. Already on anticoagulation with Eliquis. PT OT following as well. SLP following. Recent echocardiogram showed preserved EF no valve abnormality.  Paroxysmal A-fib. Currently rate controlled. On Lopressor. On Eliquis. Monitor.  Hypotension. Related to sedation. Patient was requiring some pressors peripherally. Blood pressure medication on hold.  Hypokalemia hypomagnesemia. Currently being replaced.  Alzheimer's dementia. Likely cause of seizures. Continue Aricept.  Diabetes mellitus, controlled without long-term insulin use without complication. Hemoglobin A1c 6.4. Currently on sliding scale insulin.  Concern for aspiration pneumonia. Concern for dysphagia. SLP consulted. Patient was on clear liquid diet.  Tolerating it well.  Will advance to dysphagia 2 diet thin liquids.  Currently on IV antibiotic. Monitor.  Obesity Body mass index is 31.08 kg/m.  Placing the pt at higher risk of poor outcomes.

## 2022-08-18 NOTE — Evaluation (Signed)
Occupational Therapy Evaluation Patient Details Name: Anne Wolf MRN: [id number] DOB: [birth year] Today's Date: 08/18/2022   History of Present Illness Anne Wolf is an 89 y.o. female admitted 08/15/22 with seizure. Found down at home by spouse unresponsive.  Intubated on admission and extubated 08/16/22. Started on LTM EEG and head CT negative for acute changes.  PMH: afib on Coumadin, DM, HLD, CVA, cancer, CHF, aortic stenosis.   Clinical Impression   PTA, pt lives with spouse, typically ambulatory without AD and able to manage ADLs. Spouse manages IADLs. Pt presents now with deficits in strength, standing balance, endurance and cognition. Pt requires Min A for mobility w/ cues for RW use. Pt experienced urine incontinence during session requiring Supervision for UB ADLs and up to Mod A for LB ADL mgmt due to need for hands on assist to maintain standing balance. Will continue to follow acutely with consideration of HHOT follow up as pt below ADL baseline.      Recommendations for follow up therapy are one component of a multi-disciplinary discharge planning process, led by the attending physician.  Recommendations may be updated based on patient status, additional functional criteria and insurance authorization.   Assistance Recommended at Discharge Frequent or constant Supervision/Assistance  Patient can return home with the following A little help with walking and/or transfers;A lot of help with bathing/dressing/bathroom;Assistance with cooking/housework;Direct supervision/assist for medications management;Direct supervision/assist for financial management    Functional Status Assessment  Patient has had a recent decline in their functional status and demonstrates the ability to make significant improvements in function in a reasonable and predictable amount of time.  Equipment Recommendations  Other (comment);BSC/3in1 (RW)    Recommendations for Other Services        Precautions / Restrictions Precautions Precautions: Fall Precaution Comments: seizure, watch HR Restrictions Weight Bearing Restrictions: No      Mobility Bed Mobility Overal bed mobility: Needs Assistance Bed Mobility: Supine to Sit, Sit to Supine     Supine to sit: Min guard, HOB elevated Sit to supine: Min guard        Transfers Overall transfer level: Needs assistance Equipment used: Rolling walker (2 wheels) Transfers: Sit to/from Stand Sit to Stand: Min assist           General transfer comment: Min A to stand and shift weight anteriorly      Balance Overall balance assessment: Needs assistance Sitting-balance support: Bilateral upper extremity supported Sitting balance-Leahy Scale: Fair     Standing balance support: Bilateral upper extremity supported Standing balance-Leahy Scale: Poor                             ADL either performed or assessed with clinical judgement   ADL Overall ADL's : Needs assistance/impaired Eating/Feeding: Set up Eating/Feeding Details (indicate cue type and reason): assist to open containers but able to self feed after that Grooming: Sitting;Supervision/safety   Upper Body Bathing: Supervision/ safety;Sitting   Lower Body Bathing: Minimal assistance;Sit to/from stand Lower Body Bathing Details (indicate cue type and reason): assist for balance in standing to manage bathing peri region, able to bend to reach feet Upper Body Dressing : Supervision/safety;Sitting Upper Body Dressing Details (indicate cue type and reason): cues for line mgmt Lower Body Dressing: Moderate assistance;Sit to/from stand Lower Body Dressing Details (indicate cue type and reason): assist for one sock, donning underwear around pants and multimodal cues to sequence removing soiled underwear Toilet Transfer: Minimal assistance;Ambulation;Rolling walker (  2 wheels)   Toileting- Clothing Manipulation and Hygiene: Moderate assistance;Sit  to/from stand;Sitting/lateral lean Toileting - Clothing Manipulation Details (indicate cue type and reason): incontinence when standing at sink; inquiring if pt needed to use bathroom prior to ambulation attempts though pt declined need     Functional mobility during ADLs: Minimal assistance;Rolling walker (2 wheels);Cueing for sequencing;Cueing for safety General ADL Comments: increased risk for falls, difficulty gaining balance in standing at times.     Vision Ability to See in Adequate Light: 0 Adequate Patient Visual Report: No change from baseline Vision Assessment?: No apparent visual deficits     Perception     Praxis      Pertinent Vitals/Pain Pain Assessment Pain Assessment: No/denies pain     Hand Dominance Right   Extremity/Trunk Assessment Upper Extremity Assessment Upper Extremity Assessment: Generalized weakness   Lower Extremity Assessment Lower Extremity Assessment: Defer to PT evaluation   Cervical / Trunk Assessment Cervical / Trunk Assessment: Normal   Communication Communication Communication: No difficulties   Cognition Arousal/Alertness: Awake/alert Behavior During Therapy: WFL for tasks assessed/performed Overall Cognitive Status: Impaired/Different from baseline Area of Impairment: Orientation, Memory, Following commands, Safety/judgement, Awareness, Problem solving                 Orientation Level: Disoriented to, Time, Situation, Place   Memory: Decreased short-term memory Following Commands: Follows one step commands consistently, Follows one step commands with increased time Safety/Judgement: Decreased awareness of safety, Decreased awareness of deficits Awareness: Intellectual Problem Solving: Slow processing, Decreased initiation General Comments: STM deficits as pt asking therapist same question during session but was able to self correct 1x. follows directions though does need problem solving and multimodal cues     General  Comments  HR 120s with activity    Exercises     Shoulder Instructions      Home Living Family/patient expects to be discharged to:: Private residence Living Arrangements: Spouse/significant other Available Help at Discharge: Family;Available 24 hours/day Type of Home: House Home Access: Stairs to enter [employer] of Steps: 2 at one entrance, 6 with rails at another   Home Layout: Multi-level;Able to live on main level with bedroom/bathroom     Bathroom Shower/Tub: Chief Strategy Officer: Standard     Home Equipment: None          Prior Functioning/Environment Prior Level of Function : Independent/Modified Independent             Mobility Comments: No AD. No falls. Walks 1 mile at least with husband daily ADLs Comments: spouse does all IADL's. pt performs ADL's on her own        OT Problem List: Decreased strength;Decreased activity tolerance;Impaired balance (sitting and/or standing);Decreased cognition;Decreased safety awareness;Decreased knowledge of use of DME or AE      OT Treatment/Interventions: Self-care/ADL training;Therapeutic exercise;Energy conservation;DME and/or AE instruction;Therapeutic activities;Patient/family education    OT Goals(Current goals can be found in the care plan section) Acute Rehab OT Goals Patient Stated Goal: agreeable to get OOB OT Goal Formulation: With patient Time For Goal Achievement: 09/01/22 Potential to Achieve Goals: Good  OT Frequency: Min 2X/week    Co-evaluation              AM-PAC OT "6 Clicks" Daily Activity     Outcome Measure Help from another person eating meals?: A Little Help from another person taking care of personal grooming?: A Little Help from another person toileting, which includes using toliet, bedpan, or urinal?: A  Lot Help from another person bathing (including washing, rinsing, drying)?: A Lot Help from another person to put on and taking off regular upper body  clothing?: A Little Help from another person to put on and taking off regular lower body clothing?: A Lot 6 Click Score: 15   End of Session Equipment Utilized During Treatment: Rolling walker (2 wheels) Nurse Communication: Mobility status  Activity Tolerance: Patient tolerated treatment well Patient left: in bed;with call bell/phone within reach;with bed alarm set  OT Visit Diagnosis: Unsteadiness on feet (R26.81);Other abnormalities of gait and mobility (R26.89)                Time: 1610-9604 OT Time Calculation (min): 34 min Charges:  OT General Charges $OT Visit: 1 Visit OT Evaluation $OT Eval Moderate Complexity: 1 Mod OT Treatments $Self Care/Home Management : 8-22 mins  Bradd Canary, OTR/L Acute Rehab Services Office: [phone]   Lorre Munroe 08/18/2022, 1:55 PM

## 2022-08-18 NOTE — Care Management Important Message (Signed)
Important Message  Patient Details  Name: SAKIYA HULM MRN: [id number] Date of Birth: 10/03/1933   Medicare Important Message Given:  Yes     Celestial Barnfield Stefan Church 08/18/2022, 1:54 PM

## 2022-08-18 NOTE — Progress Notes (Signed)
[facility] Progress Note Patient: Anne Wolf:[id number] DOB: 06-15-1933 DOA: 08/15/2022  DOS: the patient was seen and examined on 08/18/2022  Brief hospital course: PMH of CVA, seizure, and A-fib, type II DM, aortic stenosis present to the [facility], ED as she was found down by her husband at home.  While in the ED patient had seizures of left upper extremity and she was intubated for airway protection. Transferred to [facility] for ICU care and EEG monitoring. Neurology was consulted. Transfer out of the ICU on 6/28. Assessment and Plan: Status epilepticus. History of seizures. Presents with witnessed seizures and unwitnessed fall. Neurology was consulted. Underwent LTM EEG monitoring. Currently on Keppra. No further seizures. No further change in therapy recommended by neurology.  Acute CVA. Awaiting guidance from neurology although personally discussed with the neurologist on 6/28. Suspect these are incidental findings and does not explain patient presentation. Already on anticoagulation with Eliquis. PT OT following as well. SLP following. Recent echocardiogram showed preserved EF no valve abnormality.  Paroxysmal A-fib. Currently rate controlled. On Lopressor. On Eliquis. Monitor.  Hypotension. Related to sedation. Patient was requiring some pressors peripherally. Blood pressure medication on hold.  Hypokalemia hypomagnesemia. Currently being replaced.  Alzheimer's dementia. Likely cause of seizures. Continue Aricept.  Diabetes mellitus, controlled without long-term insulin use without complication. Hemoglobin A1c 6.4. Currently on sliding scale insulin.  Concern for aspiration pneumonia. Concern for dysphagia. SLP consulted. Patient was on clear liquid diet.  Tolerating it well.  Will advance to dysphagia 2 diet thin liquids.  Currently on IV antibiotic. Monitor.     Subjective: No acute complaint.  No nausea vomiting fever no  chills.  No chest pain.  Physical Exam: General: in Mild distress, No Rash Cardiovascular: S1 and S2 Present, No Murmur Respiratory: Increased respiratory effort, Bilateral Air entry present.  Bilateral crackles, bilateral wheezes Abdomen: Bowel Sound present, No tenderness Extremities: No edema Neuro: Alert and oriented x3, no new focal deficit  Data Reviewed: I have Reviewed nursing notes, Vitals, and Lab results. Since last encounter, pertinent lab results CBC and BMP   . I have ordered test including CBC and BMP  . I have ordered imaging procalcitonin chest x-ray  .  Disposition: Status is: Inpatient Remains inpatient appropriate because: Awaiting improvement in her respiratory status and oral intake  apixaban (ELIQUIS) tablet 5 mg   Family Communication: Husband at bedside Level of care: Progressive   Vitals:   08/18/22 0438 08/18/22 0724 08/18/22 1108 08/18/22 1556  BP: 107/66 105/73 (!) 116/54 111/71  Pulse: 100 100 99 85  Resp: (!) 24 (!) 24 (!) 24 20  Temp: 98.4 F (36.9 C) 98.7 F (37.1 C) 98.4 F (36.9 C) 98.6 F (37 C)  TempSrc: Axillary Axillary Axillary Oral  SpO2: 94% 95% 97% 92%  Weight:      Height:         Author: Lynden Oxford, MD 08/18/2022 6:39 PM  Please look on [email or web address] to find out who is on call.

## 2022-08-18 NOTE — TOC Initial Note (Signed)
Transition of Care [facility]) - Initial/Assessment Note    Patient Details  Name: Anne Wolf MRN: [id number] Date of Birth: [birth year]  [facility] CM/SW Contact:    Kermit Balo, RN Phone Number: 08/18/2022, 11:22 AM  Clinical Narrative:                 CM met with the patient and her spouse. Spouse is with her all the time.  No DME at home.  Spouse manages the patients medications at home and he denies any issues.  Spouse provides all need transportation.  Home health arranged with [facility] as per spouse preference. Information on the AVS.  Spouse will transport home when medically ready.   Expected Discharge Plan: Home w Home Health Services Barriers to Discharge: Continued Medical Work up   Patient Goals and CMS Choice   CMS Medicare.gov Compare Post Acute Care list provided to:: Patient Represenative (must comment) Choice offered to / list presented to : Spouse      Expected Discharge Plan and Services   Discharge Planning Services: CM Consult Post Acute Care Choice: Home Health Living arrangements for the past 2 months: Single Family Home                           HH Arranged: PT, OT HH Agency: [facility] Date [facility] Agency Contacted: 08/18/22   Representative spoke with at [facility] Agency: Kandee Keen  Prior Living Arrangements/Services Living arrangements for the past 2 months: Single Family Home Lives with:: Spouse Patient language and need for interpreter reviewed:: Yes Do you feel safe going back to the place where you live?: Yes      Need for Family Participation in Patient Care: Yes (Comment) Care giver support system in place?: Yes (comment) Current home services: DME (RW) Criminal Activity/Legal Involvement Pertinent to Current Situation/Hospitalization: No - Comment as needed  Activities of Daily Living      Permission Sought/Granted                  Emotional Assessment Appearance:: Appears stated  age Attitude/Demeanor/Rapport: Unable to Assess Affect (typically observed): Unable to Assess Orientation: : Oriented to Self Alcohol / Substance Use: Not Applicable Psych Involvement: No (comment)  Admission diagnosis:  Status epilepticus (HCC) [G40.901] Patient Active Problem List   Diagnosis Date Noted   Acute respiratory failure with hypoxia (HCC) 08/16/2022   Status epilepticus (HCC) 08/15/2022   History of UTI 08/04/2022   Stroke (cerebrum) (HCC) 02/10/2022   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   Transient ischemic attack (TIA) 02/18/2021   Essential hypertension 02/18/2021   Type 2 diabetes mellitus (HCC) 02/18/2021   Dyspnea 10/14/2020   Nephrolithiasis 05/26/2018   History of diagnostic tests 08/07/2012   Aortic stenosis    Hx of adenomatous colonic polyps    Long term (current) use of anticoagulants 05/31/2010   Mixed hyperlipidemia 02/08/2009   PCP:  Carylon Perches, MD Pharmacy:   [facility] - [place], [place] - [address] [place] [address] Phone: [phone] Fax: [phone]  [facility] [address], [place] - [address] [place] [address] [place] [address] [place] [address] Phone: [phone] Fax: [phone]  [id number] [place], [place] - [address] AT [facility] [address] [place] [address] Phone: [phone] Fax: [phone]     Social Determinants  of Health (SDOH) Social History: SDOH Screenings   Depression (PHQ2-9): Low Risk  (04/06/2021)  Tobacco Use: Low Risk  (08/15/2022)   SDOH Interventions:     Readmission Risk Interventions    06/01/2021    1:40 PM  Readmission Risk Prevention Plan  Post Dischage Appt Complete  Medication Screening Complete  Transportation Screening Complete

## 2022-08-19 ENCOUNTER — Inpatient Hospital Stay (HOSPITAL_COMMUNITY): Payer: No Typology Code available for payment source

## 2022-08-19 DIAGNOSIS — J690 Pneumonitis due to inhalation of food and vomit: Secondary | ICD-10-CM

## 2022-08-19 DIAGNOSIS — J90 Pleural effusion, not elsewhere classified: Secondary | ICD-10-CM | POA: Diagnosis not present

## 2022-08-19 DIAGNOSIS — Z95828 Presence of other vascular implants and grafts: Secondary | ICD-10-CM | POA: Diagnosis not present

## 2022-08-19 DIAGNOSIS — R918 Other nonspecific abnormal finding of lung field: Secondary | ICD-10-CM | POA: Diagnosis not present

## 2022-08-19 DIAGNOSIS — R0602 Shortness of breath: Secondary | ICD-10-CM | POA: Diagnosis not present

## 2022-08-19 DIAGNOSIS — G40901 Epilepsy, unspecified, not intractable, with status epilepticus: Secondary | ICD-10-CM | POA: Diagnosis not present

## 2022-08-19 LAB — COMPREHENSIVE METABOLIC PANEL
ALT: 19 U/L (ref 0–44)
AST: 30 U/L (ref 15–41)
Albumin: 2.4 g/dL — ABNORMAL LOW (ref 3.5–5.0)
Alkaline Phosphatase: 57 U/L (ref 38–126)
Anion gap: 9 (ref 5–15)
BUN: 12 mg/dL (ref 8–23)
CO2: 25 mmol/L (ref 22–32)
Calcium: 8.2 mg/dL — ABNORMAL LOW (ref 8.9–10.3)
Chloride: 102 mmol/L (ref 98–111)
Creatinine, Ser: 0.7 mg/dL (ref 0.44–1.00)
GFR, Estimated: >60 mL/min (ref >60)
Glucose, Bld: 126 mg/dL — ABNORMAL HIGH (ref 70–99)
Potassium: 3.6 mmol/L (ref 3.5–5.1)
Sodium: 136 mmol/L (ref 135–145)
Total Bilirubin: 2.2 mg/dL — ABNORMAL HIGH (ref 0.3–1.2)
Total Protein: 5.3 g/dL — ABNORMAL LOW (ref 6.5–8.1)

## 2022-08-19 LAB — CBC
HCT: 33 % — ABNORMAL LOW (ref 36.0–46.0)
Hemoglobin: 10.8 g/dL — ABNORMAL LOW (ref 12.0–15.0)
MCH: 29.7 pg (ref 26.0–34.0)
MCHC: 32.7 g/dL (ref 30.0–36.0)
MCV: 90.7 fL (ref 80.0–100.0)
Platelets: 113 10*3/uL — ABNORMAL LOW (ref 150–400)
RBC: 3.64 MIL/uL — ABNORMAL LOW (ref 3.87–5.11)
RDW: 14.3 % (ref 11.5–15.5)
WBC: 9.4 10*3/uL (ref 4.0–10.5)
nRBC: 0 % (ref 0.0–0.2)

## 2022-08-19 LAB — GLUCOSE, CAPILLARY
Glucose-Capillary: 116 mg/dL — ABNORMAL HIGH (ref 70–99)
Glucose-Capillary: 106 mg/dL — ABNORMAL HIGH (ref 70–99)
Glucose-Capillary: 124 mg/dL — ABNORMAL HIGH (ref 70–99)
Glucose-Capillary: 145 mg/dL — ABNORMAL HIGH (ref 70–99)
Glucose-Capillary: 104 mg/dL — ABNORMAL HIGH (ref 70–99)

## 2022-08-19 LAB — CULTURE, BLOOD (ROUTINE X 2)

## 2022-08-19 LAB — MAGNESIUM: Magnesium: 1.6 mg/dL — ABNORMAL LOW (ref 1.7–2.4)

## 2022-08-19 MED ORDER — ALBUTEROL SULFATE (2.5 MG/3ML) 0.083% IN NEBU
2.5000 mg | INHALATION_SOLUTION | Freq: Two times a day (BID) | RESPIRATORY_TRACT | Status: DC
  Administered 2022-08-20 – 2022-08-21 (×4): 2.5 mg via RESPIRATORY_TRACT
  Filled 2022-08-19 (×5): qty 3

## 2022-08-19 MED ORDER — GUAIFENESIN ER 600 MG PO TB12
600.0000 mg | ORAL_TABLET | Freq: Two times a day (BID) | ORAL | Status: DC
  Administered 2022-08-19 – 2022-08-22 (×7): 600 mg via ORAL
  Filled 2022-08-19 (×7): qty 1

## 2022-08-19 MED ORDER — LEVALBUTEROL TARTRATE 45 MCG/ACT IN AERO: 2.0000 | INHALATION_SPRAY | Freq: Three times a day (TID) | RESPIRATORY_TRACT | Status: DC

## 2022-08-19 MED ORDER — FUROSEMIDE 10 MG/ML IJ SOLN
20.0000 mg | Freq: Once | INTRAMUSCULAR | Status: AC
  Administered 2022-08-19: 20 mg via INTRAVENOUS
  Filled 2022-08-19: qty 4

## 2022-08-19 MED ORDER — AMOXICILLIN-POT CLAVULANATE 875-125 MG PO TABS
1.0000 | ORAL_TABLET | Freq: Two times a day (BID) | ORAL | Status: DC
  Administered 2022-08-19 – 2022-08-22 (×7): 1 via ORAL
  Filled 2022-08-19 (×7): qty 1

## 2022-08-19 MED ORDER — ALBUTEROL SULFATE (2.5 MG/3ML) 0.083% IN NEBU
2.5000 mg | INHALATION_SOLUTION | Freq: Three times a day (TID) | RESPIRATORY_TRACT | Status: DC
  Administered 2022-08-19: 2.5 mg via RESPIRATORY_TRACT
  Filled 2022-08-19: qty 3

## 2022-08-19 MED ORDER — ALBUTEROL SULFATE (2.5 MG/3ML) 0.083% IN NEBU
2.5000 mg | INHALATION_SOLUTION | Freq: Four times a day (QID) | RESPIRATORY_TRACT | Status: DC
  Administered 2022-08-19: 2.5 mg via RESPIRATORY_TRACT
  Filled 2022-08-19: qty 3

## 2022-08-19 MED ORDER — DOCUSATE SODIUM 100 MG PO CAPS
100.0000 mg | ORAL_CAPSULE | Freq: Every day | ORAL | Status: DC
  Administered 2022-08-19 – 2022-08-22 (×3): 100 mg via ORAL
  Filled 2022-08-19 (×3): qty 1

## 2022-08-19 MED ORDER — POTASSIUM CHLORIDE CRYS ER 20 MEQ PO TBCR
40.0000 meq | EXTENDED_RELEASE_TABLET | Freq: Once | ORAL | Status: AC
  Administered 2022-08-19: 40 meq via ORAL
  Filled 2022-08-19: qty 2

## 2022-08-19 MED ORDER — MAGNESIUM SULFATE 2 GM/50ML IV SOLN
2.0000 g | Freq: Once | INTRAVENOUS | Status: AC
  Administered 2022-08-19: 2 g via INTRAVENOUS
  Filled 2022-08-19: qty 50

## 2022-08-19 NOTE — Evaluation (Signed)
Clinical/Bedside Swallow Evaluation Patient Details  Name: Anne Wolf MRN: [id number] Date of Birth: 02/10/1934  Today's Date: 08/19/2022 Time: SLP Start Time (ACUTE ONLY): 1223 SLP Stop Time (ACUTE ONLY): 1249 SLP Time Calculation (min) (ACUTE ONLY): 26 min  Past Medical History:  Past Medical History:  Diagnosis Date   Aortic stenosis    mild; mild MR; slightly increased pulmonary artery pressure with mild RVH; normal LV-2010   Arthritis    Atrial fibrillation (HCC)    chronic anticoagulation; adequate HR control on minimal AV nodal blocking medication    Cancer (HCC)    skin cancers removed from face   CHF (congestive heart failure) (HCC)    Diabetes mellitus without complication (HCC)    2   Dyspnea    Dysrhythmia    Fasting hyperglycemia    + microalbuminuria; A1c of 6.5% in 11/2010   Hematochezia 01/2009   01/2009-presumed ischemic colitis; history of diverticulosis   History of kidney stones    Hx of adenomatous colonic polyps 2005   adenomatous polyp; negative colonoscopy in 2009   Hyperlipidemia    Seizure (HCC) 01/2022   Stroke [facility])    Past Surgical History:  Past Surgical History:  Procedure Laterality Date   CATARACT EXTRACTION W/PHACO Right 03/23/2014   Procedure: CATARACT EXTRACTION PHACO AND INTRAOCULAR LENS PLACEMENT (IOC);  Surgeon: Susa Simmonds, MD;  Location: [facility]S;  Service: Ophthalmology;  Laterality: Right;  CDE:2.83   CATARACT EXTRACTION W/PHACO Left 07/27/2014   Procedure: CATARACT EXTRACTION PHACO AND INTRAOCULAR LENS PLACEMENT LEFT EYE CDE=6.97;  Surgeon: Susa Simmonds, MD;  Location: [facility];  Service: Ophthalmology;  Laterality: Left;   COLONOSCOPY  2009   negative; prior study with adenomatous polyp   CYSTOSCOPY WITH RETROGRADE PYELOGRAM, URETEROSCOPY AND STENT PLACEMENT Left 04/27/2022   Procedure: CYSTOSCOPY WITH RETROGRADE PYELOGRAM, URETEROSCOPY AND STENT PLACEMENT;  Surgeon: Malen Gauze, MD;  Location: [facility];  Service:  Urology;  Laterality: Left;   CYSTOSCOPY/URETEROSCOPY/HOLMIUM LASER/STENT PLACEMENT Right 08/21/2016   Procedure: CYSTOSCOPY/URETEROSCOPY/RETROGRADE PYELOGRAM//STENT PLACEMENT;  Surgeon: Hildred Laser, MD;  Location: [facility];  Service: Urology;  Laterality: Right;   EYE SURGERY     HOLMIUM LASER APPLICATION Left 04/27/2022   Procedure: HOLMIUM LASER APPLICATION;  Surgeon: Malen Gauze, MD;  Location: [facility];  Service: Urology;  Laterality: Left;   INTRAOPERATIVE TRANSTHORACIC ECHOCARDIOGRAM N/A 05/31/2021   Procedure: INTRAOPERATIVE TRANSTHORACIC ECHOCARDIOGRAM;  Surgeon: Tonny Bollman, MD;  Location: [facility] OR;  Service: Open Heart Surgery;  Laterality: N/A;   RIGHT/LEFT HEART CATH AND CORONARY ANGIOGRAPHY N/A 02/04/2021   Procedure: RIGHT/LEFT HEART CATH AND CORONARY ANGIOGRAPHY;  Surgeon: Tonny Bollman, MD;  Location: [facility] INVASIVE CV LAB;  Service: Cardiovascular;  Laterality: N/A;   TRANSCATHETER AORTIC VALVE REPLACEMENT, TRANSFEMORAL N/A 05/31/2021   Procedure: Transcatheter Aortic Valve Replacement 23mm, Transfemoral;  Surgeon: Tonny Bollman, MD;  Location: [facility] OR;  Service: Open Heart Surgery;  Laterality: N/A;  Percutaneous   HPI:  89 y.o. female  with past medical history of dementia, CVA, seizure disorder, A-fib, DMT2, and aortic stenosis who presented to [facility], ED on 08/15/2022 after she was found down by her husband at home.  While in the ED, patient had seizures and was intubated for airway protection.  Transferred to [facility] for ICU care and LTM EEG monitoring.  She was extubated 08/16/2022.  There is concern for aspiration pneumonia and she was also found to have had an acute CVA. Pt was on a clear liquid diet upon extubation  and was recently upgraded to dys 2/thins. The pt and pt's husband report no s/sx of aspiration with any PO intake and reports pills go down without issue with thins.    Assessment / Plan / Recommendation  Clinical Impression  Pt seen for BSE to determine if  diet upgrade possible following extubation. The pt is currenty on a dys 2/thin liquid diet. The pt and pt's husband reports no difficulty swallowing at home or since admitted to the hospital. The pt also passed their Yale swallow screen. The pt's husband did note "it was a little tricky after she just got that tube removed but now she's fine". The pts OME was [facility], with the pt missing three upper front teeth which are capped, teeth found by RN and are available in the room. The pt was assessed with thin liquid, thickened liquid, puree, and solids. Pt had x4 trials of thins, x3 administered via cup sip x1 administered via straw sip. The pt had a slight wet vocal quality x3. Pt had x3 trials of mildly thickened liquid, pt had slightly wet vocal quality x1. Pt had x2 trials of puree via self-fed spoon feeds. The pt had slight wet vocal quality x1 following two large bites. Pt consumed x2 bites of solids with no oral residue, the pt then immediately took a large sequential sip of thins via straw and had slightly wet vocal quality. The pt had a wet rattle in her voice and productive cough and a t/o the treatment, with no immediate coughs or throat clearances during PO intake. Suspected wet vocal quality due to current PNA status vs current signs of aspiration due to dysphagia. However due to pt's pulmonary status and overall fragile health status, ST follow up appropriate to determine swallow baseline. Due to success with PO trials and overall health status, safest diet remains dys 2/thin liquid with STRICT aspiration precautions (upright during PO intake, small bites and sips, rate modification) and STRICT oral care. SLP to follow up closely. SLP Visit Diagnosis: Dysphagia, unspecified (R13.10)    Aspiration Risk  Mild aspiration risk    Diet Recommendation Dysphagia 2 (Fine chop);Thin liquid    Liquid Administration via: Cup;Straw Medication Administration: Whole meds with puree Supervision: Patient able to  self feed Compensations: Minimize environmental distractions;Slow rate;Small sips/bites;Clear throat after each swallow;Follow solids with liquid Postural Changes: Seated upright at 90 degrees;Remain upright for at least 30 minutes after po intake    Other  Recommendations Oral Care Recommendations: Oral care BID    Recommendations for follow up therapy are one component of a multi-disciplinary discharge planning process, led by the attending physician.  Recommendations may be updated based on patient status, additional functional criteria and insurance authorization.     Assistance Recommended at Discharge    Functional Status Assessment Patient has had a recent decline in their functional status and demonstrates the ability to make significant improvements in function in a reasonable and predictable amount of time.  Frequency and Duration min 2x/week  1 week       Prognosis Prognosis for improved oropharyngeal function: Fair Barriers to Reach Goals:  (poor pulmonary status/ overall health status)      Swallow Study   General Date of Onset: 08/16/22 HPI: 89 y.o. female  with past medical history of dementia, CVA, seizure disorder, A-fib, DMT2, and aortic stenosis who presented to [facility], ED on 08/15/2022 after she was found down by her husband at home.  While in the ED, patient had seizures and  was intubated for airway protection.  Transferred to [facility] for ICU care and LTM EEG monitoring.  She was extubated 08/16/2022.  There is concern for aspiration pneumonia and she was also found to have had an acute CVA. Pt was on a clear liquid diet upon extubation and was recently upgraded to dys 2/thins. The pt and pt's husband report no s/sx of aspiration with any PO intake and reports pills go down without issue with thins. Type of Study: Bedside Swallow Evaluation Previous Swallow Assessment: n/a Diet Prior to this Study: Dysphagia 2 (finely chopped) Temperature Spikes Noted: No Respiratory  Status: Nasal cannula (2L of O2 via [place]) History of Recent Intubation: Yes Total duration of intubation (days): 1 days Date extubated: 08/16/22 Behavior/Cognition: Cooperative;Pleasant mood (hearing concerns may impact engagement in treatment) Oral Cavity Assessment: Within Functional Limits Oral Care Completed by SLP: No Oral Cavity - Dentition: Adequate natural dentition;Missing dentition (Pt has three missing caps for her front teeth, which were recently recovered. The pt reports no increased difficulty with mastication due to missing dentition) Vision: Functional for self-feeding Self-Feeding Abilities: Able to feed self Patient Positioning: Upright in bed Baseline Vocal Quality: Wet (wet with some rattling due to PNA) Volitional Cough: Strong Volitional Swallow: Able to elicit    Oral/Motor/Sensory Function Overall Oral Motor/Sensory Function: Within functional limits   Ice Chips     Thin Liquid Thin Liquid: Within functional limits Presentation: Cup;Straw;Self Fed Pharyngeal  Phase Impairments: Wet Vocal Quality Other Comments: Pt had x4 trials of thins, x3 administered via cup sip x1 administered via straw sip. The pt had a slight wet vocal quality x3    Nectar Thick Nectar Thick Liquid: Within functional limits Presentation: Cup Pharyngeal Phase Impairments: Wet Vocal Quality Other Comments: Pt had x3 trials of mildly thickened liquid, pt had slightly wet vocal quality x1   Honey Thick     Puree Puree: Within functional limits Presentation: Self Fed;Spoon Pharyngeal Phase Impairments: Wet Vocal Quality Other Comments: Pt had x2 trials of puree via self-fed spoon feeds. The pt had slight wet vocal quality x1 following two large bites.   Solid     Solid: Within functional limits Presentation: Self Fed Pharyngeal Phase Impairments: Wet Vocal Quality Other Comments: Pt consumed x2 bites of solids with no oral residue, the pt then immediately took a large sequential sip of  thins via straw and had slightly wet vocal quality.      Dione Housekeeper M.S. CF-SLP

## 2022-08-19 NOTE — Progress Notes (Addendum)
Patient wore oxygen via nasal cannula over night; when removed for trial off during the day her O2 SATS dropped to 80-85%; resolved to 92% with 2L O2 via nasal cannula; will notify MD. Respiratory orders carried out this am.

## 2022-08-19 NOTE — Progress Notes (Signed)
[facility] Progress Note Patient: Anne Wolf FAO:[id number] DOB: 07/18/1933 DOA: 08/15/2022  DOS: the patient was seen and examined on 08/19/2022  Brief hospital course: PMH of CVA, seizure, and A-fib, type II DM, aortic stenosis present to the [facility], ED as she was found down by her husband at home.  While in the ED patient had seizures of left upper extremity and she was intubated for airway protection. Transferred to [facility] for ICU care and EEG monitoring. Neurology was consulted. Transfer out of the ICU on 6/28. Assessment and Plan: Status epilepticus. History of seizures. Presents with witnessed seizures and unwitnessed fall. Neurology was consulted. Underwent LTM EEG monitoring. Currently on Keppra. No further seizures. No further change in therapy recommended by neurology.  Acute CVA. Awaiting guidance from neurology although personally discussed with the neurologist on 6/28. Suspect these are incidental findings and does not explain patient presentation. Already on anticoagulation with Eliquis. PT OT following as well. SLP following. Recent echocardiogram showed preserved EF no valve abnormality.  Paroxysmal A-fib. Currently rate controlled. On Lopressor. On Eliquis. Monitor.  Hypotension. Related to sedation. Patient was requiring some pressors peripherally. Blood pressure medication on hold.  Hypokalemia hypomagnesemia. Currently being replaced.  Alzheimer's dementia. Likely cause of seizures. Continue Aricept.  Diabetes mellitus, controlled without long-term insulin use without complication. Hemoglobin A1c 6.4. Currently on sliding scale insulin.  Aspiration pneumonia. Concern for dysphagia. Acute hypoxic respiratory failure SLP consulted. Blood cultures are negative.  Will switch from IV antibiotics to oral antibiotics.  Will consider prolonged course. Speech therapy following. Dysphagia 2 diet recommended. Still has some  pleural effusion and therefore will give 1 dose of IV Lasix and monitor.  Obesity Body mass index is 31.08 kg/m.  Placing the pt at higher risk of poor outcomes.   Subjective: No nausea no vomiting no fever no chills.  Continues to have cough and shortness of breath.  Physical Exam: General: in Mild distress, No Rash Cardiovascular: S1 and S2 Present, No Murmur Respiratory: Good respiratory effort, Bilateral Air entry present.  Bilateral crackles, bilateral wheezes Abdomen: Bowel Sound present, No tenderness Extremities: Bilateral edema Neuro: Alert and oriented x3, no new focal deficit  Data Reviewed: I have Reviewed nursing notes, Vitals, and Lab results. Since last encounter, pertinent lab results CBC and BMP   . I have ordered test including CBC and BMP  . I have ordered imaging chest x-ray  .   Disposition: Status is: Inpatient Remains inpatient appropriate because: Improvement in respiratory status apixaban (ELIQUIS) tablet 5 mg   Family Communication: No one at bedside Level of care: Progressive   Vitals:   08/19/22 1015 08/19/22 1016 08/19/22 1229 08/19/22 1649  BP:   123/62 (!) 127/55  Pulse:   86 94  Resp:   18 18  Temp:   98 F (36.7 C) 98.3 F (36.8 C)  TempSrc:    Oral  SpO2: (!) 80% 92% 98% 94%  Weight:      Height:         Author: Lynden Oxford, MD 08/19/2022 5:16 PM  Please look on [email or web address] to find out who is on call.

## 2022-08-19 NOTE — Progress Notes (Signed)
Physical Therapy Treatment Patient Details Name: Anne Wolf MRN: [id number] DOB: May 30, 1933 Today's Date: 08/19/2022   History of Present Illness Anne Wolf is an 89 y.o. female admitted 08/15/22 with seizure. Found down at home by spouse unresponsive.  Intubated on admission and extubated 08/16/22. Started on LTM EEG and head CT negative for acute changes.  PMH: afib on Coumadin, DM, HLD, CVA, cancer, CHF, aortic stenosis.    PT Comments    Pt tolerates treatment well, demonstrating improvements in balance and activity tolerance. Pt is able to transfer and ambulate with decreased assistance, and for increased distances. PT will continue to follow in an effort to further improve mobility tolerance and to reinforce walker use due to pt's memory deficits.   Recommendations for follow up therapy are one component of a multi-disciplinary discharge planning process, led by the attending physician.  Recommendations may be updated based on patient status, additional functional criteria and insurance authorization.  Follow Up Recommendations       Assistance Recommended at Discharge Frequent or constant Supervision/Assistance  Patient can return home with the following A little help with walking and/or transfers;A little help with bathing/dressing/bathroom;Direct supervision/assist for medications management;Assist for transportation;Help with stairs or ramp for entrance;Assistance with cooking/housework   Equipment Recommendations  Rolling walker (2 wheels)    Recommendations for Other Services       Precautions / Restrictions Precautions Precautions: Fall Precaution Comments: seizure, watch HR and O2 Restrictions Weight Bearing Restrictions: No     Mobility  Bed Mobility Overal bed mobility: Needs Assistance Bed Mobility: Supine to Sit     Supine to sit: Min guard, HOB elevated          Transfers Overall transfer level: Needs assistance Equipment used: Rolling  walker (2 wheels) Transfers: Sit to/from Stand Sit to Stand: Min guard                Ambulation/Gait Ambulation/Gait assistance: Min guard Gait Distance (Feet): 80 Feet Assistive device: Rolling walker (2 wheels) Gait Pattern/deviations: Step-through pattern Gait velocity: reduced Gait velocity interpretation: <1.8 ft/sec, indicate of risk for recurrent falls   General Gait Details: slowed step-through gait, increased lateral sway   Stairs             Wheelchair Mobility    Modified Rankin (Stroke Patients Only)       Balance Overall balance assessment: Needs assistance Sitting-balance support: No upper extremity supported, Feet supported Sitting balance-Leahy Scale: Fair     Standing balance support: Bilateral upper extremity supported, Reliant on assistive device for balance Standing balance-Leahy Scale: Poor                              Cognition Arousal/Alertness: Awake/alert Behavior During Therapy: WFL for tasks assessed/performed Overall Cognitive Status: Impaired/Different from baseline Area of Impairment: Orientation, Attention, Memory, Following commands, Safety/judgement, Awareness                 Orientation Level: Disoriented to, Time, Situation, Place Current Attention Level: Sustained Memory: Decreased short-term memory Following Commands: Follows one step commands consistently, Follows multi-step commands inconsistently Safety/Judgement: Decreased awareness of safety, Decreased awareness of deficits Awareness: Intellectual Problem Solving: Slow processing, Difficulty sequencing, Requires verbal cues          Exercises      General Comments General comments (skin integrity, edema, etc.): VSS on 2L [place], mild tachycardia into low 100s      Pertinent Vitals/Pain  Pain Assessment Pain Assessment: No/denies pain    Home Living                          Prior Function            PT Goals (current  goals can now be found in the care plan section) Acute Rehab PT Goals Patient Stated Goal: to return home Progress towards PT goals: Progressing toward goals    Frequency    Min 4X/week      PT Plan Current plan remains appropriate    Co-evaluation              AM-PAC PT "6 Clicks" Mobility   Outcome Measure  Help needed turning from your back to your side while in a flat bed without using bedrails?: A Little Help needed moving from lying on your back to sitting on the side of a flat bed without using bedrails?: A Little Help needed moving to and from a bed to a chair (including a wheelchair)?: A Little Help needed standing up from a chair using your arms (e.g., wheelchair or bedside chair)?: A Little Help needed to walk in hospital room?: A Little Help needed climbing 3-5 steps with a railing? : A Lot 6 Click Score: 17    End of Session Equipment Utilized During Treatment: Gait belt Activity Tolerance: Patient tolerated treatment well Patient left: in chair;with call bell/phone within reach;with chair alarm set;with family/visitor present Nurse Communication: Mobility status PT Visit Diagnosis: Other abnormalities of gait and mobility (R26.89);Other symptoms and signs involving the nervous system (R29.898)     Time: 6606-3016 PT Time Calculation (min) (ACUTE ONLY): 27 min  Charges:  $Gait Training: 8-22 mins $Therapeutic Activity: 8-22 mins                     Arlyss Gandy, PT, DPT Acute Rehabilitation Office [phone]    Arlyss Gandy 08/19/2022, 3:02 PM

## 2022-08-19 NOTE — Progress Notes (Signed)
Pt educated on how to use flutter valve by RT. Pt expressed understanding and demonstrated how use flutter with read back. Flutter valve at bedside, RT will monitor as needed.

## 2022-08-19 NOTE — Progress Notes (Signed)
Palliative Medicine Progress Note   Patient Name: Anne Wolf       Date: 08/19/2022 DOB: 1934/02/16  Age: 89 y.o. MRN#: [id number] Attending Physician: Rolly Salter, MD Primary Care Physician: Carylon Perches, MD Admit Date: 08/15/2022    HPI/Patient Profile: 89 y.o. female  with past medical history of dementia, CVA, seizure disorder, A-fib, DMT2, and aortic stenosis who presented to [facility], ED on 08/15/2022 after she was found down by her husband at home.  While in the ED, patient had seizures and was intubated for airway protection.  Transferred to [facility] for ICU care and LTM EEG monitoring.  She was extubated 08/16/2022.  There is concern for aspiration pneumonia and she was also found to have had an acute CVA.    Subjective: Chart reviewed. Note patient required oxygen overnight.  Bedside visit. Patient is OOB to the recliner, but asking to get back to bed. No acute complaints. Husband at bedside and does not have questions or concerns.  Per husband's request, I spoke with their son Arlys John by phone. We discussed patient's current illness and what it means in the larger context of her ongoing co-morbidities.   We reviewed patient's acute medical problems including breakthrough seizures, acute respiratory failure, concern for aspiration pneumonia, and new CVA.   We also reviewed that dementia is a progressive, non-curable disease underlying the patient's current acute medical conditions.  We discussed that overall patient seems to be recovering from her acute illness, but it is unlikely she will fully return to her previous baseline due to advanced age, chronic illness, and underlying dementia.  I have encouraged husband and son to consider advanced care planning. Arlys John plans to review the  MOST form with his father, and they will reach out to PMT if/when they are ready to complete this.    Objective:  Physical Exam Vitals reviewed.  Constitutional:      General: She is not in acute distress. Neurological:     Mental Status: She is alert.     Motor: Weakness present.     Comments: Oriented to person and place             Vital Signs: BP 123/62 (BP Location: Left Arm)   Pulse 86   Temp 98 F (36.7 C)   Resp 18   Ht  5\' 1"  (1.549 m)   Wt 74.6 kg   SpO2 98%   BMI 31.08 kg/m  SpO2: SpO2: 98 % O2 Device: O2 Device: Room Air O2 Flow Rate: O2 Flow Rate (L/min): 2 L/min   Palliative Medicine Assessment & Plan   Assessment: Principal Problem:   Status epilepticus (HCC) Active Problems:   Acute respiratory failure with hypoxia (HCC)    Recommendations/Plan: DNR as previously documented Continue current supportive interventions Goal of care - continued improvement and medical stabilization.  Husband is hopeful patient's functional status will improve with continued PT services.   Prognosis:  Unable to determine  Discharge Planning: To Be Determined, likely home with HH/PT    Thank you for allowing the Palliative Medicine Team to assist in the care of this patient.   MDM - moderate   Merry Proud, NP   Please contact Palliative Medicine Team phone at [phone] for questions and concerns.  For individual providers, please see AMION.

## 2022-08-19 NOTE — Consult Note (Signed)
Palliative Care Consult Note                                  Date: 08/19/2022   Patient Name: Anne Wolf  DOB: [birth year]  MRN: [id number]  Age / Sex: 89 y.o., female  PCP: Carylon Perches, MD Referring Physician: Rolly Salter, MD  Reason for Consultation: Establishing goals of care  HPI/Patient Profile: 89 y.o. female  with past medical history of dementia, CVA, seizure disorder, A-fib, DMT2, and aortic stenosis who presented to [facility], ED on 08/15/2022 after she was found down by her husband at home.  While in the ED, patient had seizures and was intubated for airway protection.  Transferred to [facility] for ICU care and LTM EEG monitoring.  She was extubated 08/16/2022.  There is concern for aspiration pneumonia and she was also found to have had an acute CVA.   Subjective:   I have reviewed medical records including progress notes, labs and imaging, and assessed the patient at bedside. She is awake and oriented to person and place. She is able to state she is in the hospital due to "seizures".  I met with her husband at bedside to discuss diagnosis, prognosis, GOC, disposition, and options.  I introduced Palliative Medicine as specialized medical care for people living with serious illness. It focuses on providing relief from the symptoms and stress of a serious illness.   Created space and opportunity for patient and family to express thoughts and feelings regarding current medical situation. Values and goals of care were attempted to be elicited.  Life Review: Patient was born in [place] but is lived in [place] most of her life.  She and her husband have been married for 67 years.  They have 2 sons -Arlys John (lives locally) and Micah Noel (lives in [place]).  They also have 3 grandchildren.  Prior to retiring, patient worked for [employer].  She enjoyed playing tennis up until around age 63.  Functional Status: Has been reports a  recent decline in patient's functional status over the past 6 weeks or so.  She has had issues with balance as well as decreased oral intake.  Goals: Medical stabilization and to return home.  Husband is hopeful that patient's functional status will improve with continued PT services.  GOC Discussion: We discussed patient's current illness and what it means in the larger context of her ongoing co-morbidities.   We reviewed patient's acute medical problems including breakthrough seizures, acute respiratory failure, concern for aspiration pneumonia, and new CVA.    We also reviewed that dementia is a progressive, non-curable disease underlying the patient's current acute medical conditions.   We discussed that overall patient seems to be recovering from her acute illness. However, it is unlikely she will fully return to her previous baseline due to advanced age, chronic illness, and underlying dementia.  Husband is hopeful for continued improvement and medical stabilization.  He asked about disposition; I explained that current recommendation is for continued PT services at home.  The MOST form was introduced.  We discussed concepts specific to code status, artifical feeding and hydration, continued IV antibiotics and rehospitalization.   Questions and concerns were addressed.  Emotional support provided via active listening. Husband requests that I speak with his son Arlys John, and I agreed to call Arlys John tomorrow.    Review of Systems  All other systems reviewed and are negative.  Objective:   Primary Diagnoses: Present on Admission:  Status epilepticus [facility])   Physical Exam Vitals reviewed.  Constitutional:      General: She is awake. She is not in acute distress.    Comments: Frail and chronically ill-appearing  Pulmonary:     Effort: Pulmonary effort is normal.  Neurological:     Motor: Weakness present.     Comments: Oriented to person and place     Vital Signs:  BP  115/60 (BP Location: Left Arm)   Pulse 90   Temp 98.5 F (36.9 C)   Resp 18   Ht 5\' 1"  (1.549 m)   Wt 74.6 kg   SpO2 (!) 85%   BMI 31.08 kg/m   Palliative Assessment/Data: PPS 40-50%     Assessment & Plan:   SUMMARY OF RECOMMENDATIONS   DNR as previously documented Continue current supportive interventions Goal of care - continued improvement and medical stabilization.  Husband is hopeful patient's functional status will improve with continued PT services. PMT will follow-up tomorrow  Primary Decision Maker: NEXT OF KIN  Prognosis:  Unable to determine  Discharge Planning:  To Be Determined, likely home with HH/PT   Thank you for allowing [place] to participate in the care of Anne Wolf  MDM - High  Signed by: Sherlean Foot, NP Palliative Medicine Team  Team Phone # [phone]  For individual providers, please see AMION

## 2022-08-20 DIAGNOSIS — G40901 Epilepsy, unspecified, not intractable, with status epilepticus: Secondary | ICD-10-CM | POA: Diagnosis not present

## 2022-08-20 LAB — COMPREHENSIVE METABOLIC PANEL
ALT: 27 U/L (ref 0–44)
AST: 41 U/L (ref 15–41)
Albumin: 2.3 g/dL — ABNORMAL LOW (ref 3.5–5.0)
Alkaline Phosphatase: 69 U/L (ref 38–126)
Anion gap: 8 (ref 5–15)
BUN: 11 mg/dL (ref 8–23)
CO2: 27 mmol/L (ref 22–32)
Calcium: 8.2 mg/dL — ABNORMAL LOW (ref 8.9–10.3)
Chloride: 102 mmol/L (ref 98–111)
Creatinine, Ser: 0.56 mg/dL (ref 0.44–1.00)
GFR, Estimated: >60 mL/min (ref >60)
Glucose, Bld: 117 mg/dL — ABNORMAL HIGH (ref 70–99)
Potassium: 3.4 mmol/L — ABNORMAL LOW (ref 3.5–5.1)
Sodium: 137 mmol/L (ref 135–145)
Total Bilirubin: 2.4 mg/dL — ABNORMAL HIGH (ref 0.3–1.2)
Total Protein: 5.3 g/dL — ABNORMAL LOW (ref 6.5–8.1)

## 2022-08-20 LAB — GLUCOSE, CAPILLARY
Glucose-Capillary: 112 mg/dL — ABNORMAL HIGH (ref 70–99)
Glucose-Capillary: 90 mg/dL (ref 70–99)
Glucose-Capillary: 106 mg/dL — ABNORMAL HIGH (ref 70–99)
Glucose-Capillary: 125 mg/dL — ABNORMAL HIGH (ref 70–99)

## 2022-08-20 LAB — CULTURE, BLOOD (ROUTINE X 2): Culture: NO GROWTH

## 2022-08-20 LAB — MAGNESIUM: Magnesium: 1.7 mg/dL (ref 1.7–2.4)

## 2022-08-20 MED ORDER — FUROSEMIDE 10 MG/ML IJ SOLN: 20.0000 mg | Freq: Once | INTRAMUSCULAR | Status: DC

## 2022-08-20 MED ORDER — POTASSIUM CHLORIDE CRYS ER 20 MEQ PO TBCR
40.0000 meq | EXTENDED_RELEASE_TABLET | ORAL | Status: AC
  Administered 2022-08-20 (×2): 40 meq via ORAL
  Filled 2022-08-20 (×2): qty 2

## 2022-08-20 MED ORDER — FUROSEMIDE 10 MG/ML IJ SOLN
20.0000 mg | Freq: Two times a day (BID) | INTRAMUSCULAR | Status: AC
  Administered 2022-08-20 – 2022-08-21 (×3): 20 mg via INTRAVENOUS
  Filled 2022-08-20 (×3): qty 4

## 2022-08-20 MED ORDER — DONEPEZIL HCL 10 MG PO TABS
5.0000 mg | ORAL_TABLET | Freq: Every day | ORAL | Status: DC
  Administered 2022-08-20 – 2022-08-21 (×2): 5 mg via ORAL
  Filled 2022-08-20 (×2): qty 1

## 2022-08-20 NOTE — Progress Notes (Signed)
[facility] Progress Note Patient: Anne Wolf VFI:[id number] DOB: 1933-11-24 DOA: 08/15/2022  DOS: the patient was seen and examined on 08/20/2022  Brief hospital course: PMH of CVA, seizure, and A-fib, type II DM, aortic stenosis present to the [facility], ED as she was found down by her husband at home.  While in the ED patient had seizures of left upper extremity and she was intubated for airway protection. Transferred to [facility] for ICU care and EEG monitoring. Neurology was consulted. Transfer out of the ICU on 6/28. Assessment and Plan: Status epilepticus. History of seizures. Presents with witnessed seizures and unwitnessed fall. Neurology was consulted. Underwent LTM EEG monitoring. Currently on Keppra. No further seizures. No further change in therapy recommended by neurology.  Acute CVA. Awaiting guidance from neurology although personally discussed with the neurologist on 6/28. Suspect these are incidental findings and does not explain patient presentation. Already on anticoagulation with Eliquis. PT OT following as well. SLP following. Recent echocardiogram showed preserved EF no valve abnormality.  Paroxysmal A-fib. Currently rate controlled. On Lopressor. On Eliquis. Monitor.  Hypotension. Related to sedation. Patient was requiring some pressors peripherally. Blood pressure medication on hold.  Hypokalemia hypomagnesemia. Currently being replaced.  Alzheimer's dementia. Likely cause of seizures. Continue Aricept.  Diabetes mellitus, controlled without long-term insulin use without complication. Hemoglobin A1c 6.4. Currently on sliding scale insulin.  Aspiration pneumonia. Concern for dysphagia. Acute hypoxic respiratory failure SLP consulted. Blood cultures are negative.  Now on oral antibiotic.  Total 10-day treatment course recommended. Speech therapy following. Dysphagia 2 diet recommended. Appears to be volume overloaded.   Will be given IV Lasix and monitor.  Obesity Body mass index is 31.08 kg/m.  Placing the pt at higher risk of poor outcomes.   Subjective: continues to have shortness of breath.  Continues to have cough.  No nausea or vomiting.  No chills.  Physical Exam: In moderate distress. No rash. Bilateral expiratory wheezing as well as crackles. Bilateral trace edema. Alert and oriented.  Data Reviewed: I have Reviewed nursing notes, Vitals, and Lab results. Reviewed CBC and BMP.  Reordered CBC and BMP.  Disposition: Status is: Inpatient Remains inpatient appropriate because: Improvement in respiratory status apixaban (ELIQUIS) tablet 5 mg   Family Communication: Husband one at bedside Level of care: Progressive   Vitals:   08/20/22 0747 08/20/22 0913 08/20/22 1243 08/20/22 1643  BP:  126/65 130/83 128/63  Pulse: 89 98 92 96  Resp: 20 19 18 17   Temp:  98.1 F (36.7 C) 98.1 F (36.7 C) 97.9 F (36.6 C)  TempSrc:  Oral Oral Oral  SpO2: 97% 94% 96% 94%  Weight:      Height:         Author: Lynden Oxford, MD 08/20/2022 5:45 PM  Please look on [email or web address] to find out who is on call.

## 2022-08-21 DIAGNOSIS — G40901 Epilepsy, unspecified, not intractable, with status epilepticus: Secondary | ICD-10-CM | POA: Diagnosis not present

## 2022-08-21 LAB — COMPREHENSIVE METABOLIC PANEL
ALT: 31 U/L (ref 0–44)
AST: 43 U/L — ABNORMAL HIGH (ref 15–41)
Albumin: 2.5 g/dL — ABNORMAL LOW (ref 3.5–5.0)
Alkaline Phosphatase: 80 U/L (ref 38–126)
Anion gap: 9 (ref 5–15)
BUN: 8 mg/dL (ref 8–23)
CO2: 30 mmol/L (ref 22–32)
Calcium: 9 mg/dL (ref 8.9–10.3)
Chloride: 97 mmol/L — ABNORMAL LOW (ref 98–111)
Creatinine, Ser: 0.73 mg/dL (ref 0.44–1.00)
GFR, Estimated: >60 mL/min (ref >60)
Glucose, Bld: 121 mg/dL — ABNORMAL HIGH (ref 70–99)
Potassium: 3.2 mmol/L — ABNORMAL LOW (ref 3.5–5.1)
Sodium: 136 mmol/L (ref 135–145)
Total Bilirubin: 1.8 mg/dL — ABNORMAL HIGH (ref 0.3–1.2)
Total Protein: 5.9 g/dL — ABNORMAL LOW (ref 6.5–8.1)

## 2022-08-21 LAB — GLUCOSE, CAPILLARY
Glucose-Capillary: 119 mg/dL — ABNORMAL HIGH (ref 70–99)
Glucose-Capillary: 108 mg/dL — ABNORMAL HIGH (ref 70–99)
Glucose-Capillary: 125 mg/dL — ABNORMAL HIGH (ref 70–99)
Glucose-Capillary: 129 mg/dL — ABNORMAL HIGH (ref 70–99)
Glucose-Capillary: 137 mg/dL — ABNORMAL HIGH (ref 70–99)
Glucose-Capillary: 140 mg/dL — ABNORMAL HIGH (ref 70–99)
Glucose-Capillary: 92 mg/dL (ref 70–99)

## 2022-08-21 LAB — MAGNESIUM: Magnesium: 1.8 mg/dL (ref 1.7–2.4)

## 2022-08-21 MED ORDER — POTASSIUM CHLORIDE CRYS ER 20 MEQ PO TBCR
40.0000 meq | EXTENDED_RELEASE_TABLET | ORAL | Status: AC
  Administered 2022-08-21 (×2): 40 meq via ORAL
  Filled 2022-08-21 (×2): qty 2

## 2022-08-21 MED ORDER — MAGNESIUM SULFATE 2 GM/50ML IV SOLN
2.0000 g | Freq: Once | INTRAVENOUS | Status: AC
  Administered 2022-08-21: 2 g via INTRAVENOUS
  Filled 2022-08-21: qty 50

## 2022-08-21 NOTE — Progress Notes (Signed)
[facility] Progress Note Patient: Anne Wolf ION:[id number] DOB: 15-Aug-1933 DOA: 08/15/2022  DOS: the patient was seen and examined on 08/21/2022  Brief hospital course: PMH of CVA, seizure, and A-fib, type II DM, aortic stenosis present to the [facility], ED as she was found down by her husband at home.  While in the ED patient had seizures of left upper extremity and she was intubated for airway protection. Transferred to [facility] for ICU care and EEG monitoring. Neurology was consulted. Transfer out of the ICU on 6/28.  Assessment and Plan: Status epilepticus. History of seizures. Presents with witnessed seizures and unwitnessed fall. Neurology was consulted. Underwent LTM EEG monitoring. Currently on Keppra. No further seizures. No further change in therapy recommended by neurology.  Acute CVA. Awaiting guidance from neurology although personally discussed with the neurologist on 6/28. Suspect these are incidental findings and does not explain patient presentation. Already on anticoagulation with Eliquis. PT OT following as well. SLP following. Recent echocardiogram showed preserved EF no valve abnormality.  Paroxysmal A-fib. Currently rate controlled. On Lopressor. On Eliquis. Monitor.  Hypotension. Related to sedation. Patient was requiring some pressors peripherally. Blood pressure medication on hold.  Hypokalemia hypomagnesemia. Currently being replaced.  Alzheimer's dementia. Likely cause of seizures. Continue Aricept.  Diabetes mellitus, controlled without long-term insulin use without complication. Hemoglobin A1c 6.4. Currently on sliding scale insulin.  Aspiration pneumonia. Concern for dysphagia. Acute hypoxic respiratory failure SLP consulted. Blood cultures are negative.  Now on oral antibiotic.  Total 10-day treatment course recommended. Speech therapy following. Dysphagia 2 diet recommended. Responded well to IV Lasix.   Will hold diuresis.  Currently on room air.  Obesity Body mass index is 31.08 kg/m.  Placing the pt at higher risk of poor outcomes.   Subjective: Continues to have some cough.  No nausea no vomiting no fever no chills.  Seen ambulating in the hallway without oxygen.  Physical Exam: General: in Mild distress, No Rash Cardiovascular: S1 and S2 Present, No Murmur Respiratory: Good respiratory effort, Bilateral Air entry present.  Faint basal crackles, No wheezes Abdomen: Bowel Sound present, No tenderness Extremities: No edema Neuro: Alert and oriented x3, no new focal deficit  Data Reviewed: I have Reviewed nursing notes, Vitals, and Lab results. Since last encounter, pertinent lab results CBC and BMP   . I have ordered test including CBC and BMP  .   Disposition: Status is: Inpatient Remains inpatient appropriate because: Monitor for overnight stability of oxygen apixaban (ELIQUIS) tablet 5 mg   Family Communication: Family at bedside discussed with son on the phone. Level of care: Progressive   Vitals:   08/21/22 0830 08/21/22 0847 08/21/22 1135 08/21/22 1618  BP: 126/69  102/60 (!) 112/50  Pulse: 88   64  Resp: 20  18 20   Temp: 98.8 F (37.1 C)  98.1 F (36.7 C) 98 F (36.7 C)  TempSrc: Oral  Oral Oral  SpO2: 93% 96% 95% 93%  Weight:      Height:         Author: Lynden Oxford, MD 08/21/2022 6:49 PM  Please look on [email or web address] to find out who is on call.

## 2022-08-21 NOTE — Progress Notes (Signed)
Physical Therapy Treatment Patient Details Name: Anne Wolf MRN: [id number] DOB: 01/25/1934 Today's Date: 08/21/2022   History of Present Illness Anne Wolf is an 89 y.o. female admitted 08/15/22 with seizure. Found down at home by spouse unresponsive.  Intubated on admission and extubated 08/16/22. Started on LTM EEG and head CT negative for acute changes.  PMH: afib on Coumadin, DM, HLD, CVA, cancer, CHF, aortic stenosis.    PT Comments  Pt with continued progress towards acute goals progressing gait with and without RW support for increased distance. Pt initially holding this PTA at the waist for support without RW, however progressing with increased distance to no external support needed. Pt without LOB throughout gait. Continued education and reinforcement of RW management and safety. Pt continues to benefit from skilled PT services to progress toward functional mobility goals.      Assistance Recommended at Discharge Frequent or constant Supervision/Assistance  If plan is discharge home, recommend the following:  Can travel by private vehicle    A little help with walking and/or transfers;A little help with bathing/dressing/bathroom;Direct supervision/assist for medications management;Assist for transportation;Help with stairs or ramp for entrance;Assistance with cooking/housework      Equipment Recommendations  Rolling walker (2 wheels)    Recommendations for Other Services       Precautions / Restrictions Precautions Precautions: Fall Precaution Comments: seizure, watch HR and O2 Restrictions Weight Bearing Restrictions: No     Mobility  Bed Mobility Overal bed mobility: Needs Assistance Bed Mobility: Supine to Sit     Supine to sit: Min guard, HOB elevated     General bed mobility comments: min guard for safety, cues to scoot out to EOB    Transfers Overall transfer level: Needs assistance Equipment used: Rolling walker (2 wheels) Transfers: Sit  to/from Stand Sit to Stand: Min guard           General transfer comment: min guard for safety, max cues for hand placement    Ambulation/Gait Ambulation/Gait assistance: Min guard, Min assist Gait Distance (Feet): 325 Feet Assistive device: Rolling walker (2 wheels), None Gait Pattern/deviations: Step-through pattern Gait velocity: reduced     General Gait Details: slowed step-through gait, increased lateral sway with pt pushing RW too far out in front of her with inability to correct, able to demonstrate gait without AD for last 200' with no LOB with increasing stability with increased distance   Stairs             Wheelchair Mobility     Tilt Bed    Modified Rankin (Stroke Patients Only)       Balance Overall balance assessment: Needs assistance Sitting-balance support: No upper extremity supported, Feet supported Sitting balance-Leahy Scale: Fair Sitting balance - Comments: intermittent posterior lean needing cues to correct   Standing balance support: Bilateral upper extremity supported, Reliant on assistive device for balance Standing balance-Leahy Scale: Fair Standing balance comment: able to ambulate without UE supprot                            Cognition Arousal/Alertness: Awake/alert Behavior During Therapy: WFL for tasks assessed/performed Overall Cognitive Status: Impaired/Different from baseline Area of Impairment: Orientation, Attention, Memory, Following commands, Safety/judgement, Awareness                 Orientation Level: Disoriented to, Time, Situation, Place Current Attention Level: Sustained Memory: Decreased short-term memory Following Commands: Follows one step commands consistently, Follows multi-step commands  inconsistently Safety/Judgement: Decreased awareness of safety, Decreased awareness of deficits Awareness: Intellectual Problem Solving: Slow processing, Difficulty sequencing, Requires verbal cues General  Comments: follows directions though does need problem solving and multimodal cues        Exercises      General Comments General comments (skin integrity, edema, etc.): VSS on RA, SpO2 96% on RA at rest, maintaining >90% on RA throughout activity      Pertinent Vitals/Pain Pain Assessment Pain Assessment: No/denies pain    Home Living                          Prior Function            PT Goals (current goals can now be found in the care plan section) Acute Rehab PT Goals Patient Stated Goal: to return home PT Goal Formulation: With patient/family Time For Goal Achievement: 08/31/22 Progress towards PT goals: Progressing toward goals    Frequency    Min 4X/week      PT Plan Current plan remains appropriate    Co-evaluation              AM-PAC PT "6 Clicks" Mobility   Outcome Measure  Help needed turning from your back to your side while in a flat bed without using bedrails?: A Little Help needed moving from lying on your back to sitting on the side of a flat bed without using bedrails?: A Little Help needed moving to and from a bed to a chair (including a wheelchair)?: A Little Help needed standing up from a chair using your arms (e.g., wheelchair or bedside chair)?: A Little Help needed to walk in hospital room?: A Little Help needed climbing 3-5 steps with a railing? : A Lot 6 Click Score: 17    End of Session Equipment Utilized During Treatment: Gait belt Activity Tolerance: Patient tolerated treatment well Patient left: in chair;with call bell/phone within reach;with chair alarm set Nurse Communication: Mobility status PT Visit Diagnosis: Other abnormalities of gait and mobility (R26.89);Other symptoms and signs involving the nervous system (R29.898)     Time: 0454-0981 PT Time Calculation (min) (ACUTE ONLY): 20 min  Charges:    $Gait Training: 8-22 mins PT General Charges $$ ACUTE PT VISIT: 1 Visit                     Marcianna Daily R.  PTA Acute Rehabilitation Services Office: [phone]   Catalina Antigua 08/21/2022, 4:09 PM

## 2022-08-21 NOTE — TOC Progression Note (Signed)
Transition of Care [facility]) - Progression Note    Patient Details  Name: Anne Wolf MRN: [id number] Date of Birth: [birth year]  Transition of Care [facility]) CM/SW Contact  Kermit Balo, RN Phone Number: 08/21/2022, 2:25 PM  Clinical Narrative:    Plan is for home with home health services when pt is medically ready.  TOC following.   Expected Discharge Plan: Home w Home Health Services Barriers to Discharge: Continued Medical Work up  Expected Discharge Plan and Services   Discharge Planning Services: CM Consult Post Acute Care Choice: Home Health Living arrangements for the past 2 months: Single Family Home                           HH Arranged: PT, OT HH Agency: [facility] Date [facility] Agency Contacted: 08/18/22   Representative spoke with at [facility] Agency: Kandee Keen   Social Determinants of Health (SDOH) Interventions SDOH Screenings   Food Insecurity: No Food Insecurity (08/18/2022)  Housing: Low Risk  (08/18/2022)  Transportation Needs: No Transportation Needs (08/18/2022)  Depression (PHQ2-9): Low Risk  (04/06/2021)  Tobacco Use: Low Risk  (08/15/2022)    Readmission Risk Interventions    06/01/2021    1:40 PM  Readmission Risk Prevention Plan  Post Dischage Appt Complete  Medication Screening Complete  Transportation Screening Complete

## 2022-08-21 NOTE — Plan of Care (Signed)
?  Problem: Education: ?Goal: Expressions of having a comfortable level of knowledge regarding the disease process will increase ?Outcome: Progressing ?  ?Problem: Coping: ?Goal: Ability to adjust to condition or change in health will improve ?Outcome: Progressing ?  ?

## 2022-08-22 ENCOUNTER — Other Ambulatory Visit (HOSPITAL_COMMUNITY): Payer: Self-pay

## 2022-08-22 DIAGNOSIS — G40901 Epilepsy, unspecified, not intractable, with status epilepticus: Secondary | ICD-10-CM | POA: Diagnosis not present

## 2022-08-22 DIAGNOSIS — R1310 Dysphagia, unspecified: Secondary | ICD-10-CM

## 2022-08-22 DIAGNOSIS — J690 Pneumonitis due to inhalation of food and vomit: Secondary | ICD-10-CM | POA: Diagnosis present

## 2022-08-22 LAB — GLUCOSE, CAPILLARY: Glucose-Capillary: 119 mg/dL — ABNORMAL HIGH (ref 70–99)

## 2022-08-22 MED ORDER — POTASSIUM CHLORIDE CRYS ER 20 MEQ PO TBCR
20.0000 meq | EXTENDED_RELEASE_TABLET | Freq: Two times a day (BID) | ORAL | 0 refills | Status: DC
  Filled 2022-08-22: qty 20, 10d supply, fill #0

## 2022-08-22 MED ORDER — METOPROLOL SUCCINATE ER 25 MG PO TB24
50.0000 mg | ORAL_TABLET | Freq: Every day | ORAL | 0 refills | Status: DC
  Filled 2022-08-22: qty 60, 30d supply, fill #0

## 2022-08-22 MED ORDER — POTASSIUM CHLORIDE CRYS ER 10 MEQ PO TBCR
10.0000 meq | EXTENDED_RELEASE_TABLET | Freq: Two times a day (BID) | ORAL | 0 refills | Status: DC
  Filled 2022-08-22: qty 30, 15d supply, fill #0

## 2022-08-22 MED ORDER — GUAIFENESIN ER 600 MG PO TB12
600.0000 mg | ORAL_TABLET | Freq: Two times a day (BID) | ORAL | 0 refills | Status: AC
  Filled 2022-08-22: qty 20, 10d supply, fill #0

## 2022-08-22 MED ORDER — ALBUTEROL SULFATE HFA 108 (90 BASE) MCG/ACT IN AERS
1.0000 | INHALATION_SPRAY | Freq: Three times a day (TID) | RESPIRATORY_TRACT | 0 refills | Status: DC
  Filled 2022-08-22: qty 6.7, 30d supply, fill #0

## 2022-08-22 MED ORDER — LEVETIRACETAM 750 MG PO TABS
750.0000 mg | ORAL_TABLET | Freq: Two times a day (BID) | ORAL | 0 refills | Status: DC
  Filled 2022-08-22: qty 60, 30d supply, fill #0

## 2022-08-22 MED ORDER — DOCUSATE SODIUM 100 MG PO CAPS
100.0000 mg | ORAL_CAPSULE | Freq: Two times a day (BID) | ORAL | 0 refills | Status: AC | PRN
  Filled 2022-08-22: qty 100, 50d supply, fill #0

## 2022-08-22 MED ORDER — FUROSEMIDE 20 MG PO TABS
20.0000 mg | ORAL_TABLET | Freq: Every day | ORAL | 0 refills | Status: DC
  Filled 2022-08-22: qty 30, 30d supply, fill #0

## 2022-08-22 MED ORDER — AMOXICILLIN-POT CLAVULANATE 875-125 MG PO TABS
1.0000 | ORAL_TABLET | Freq: Two times a day (BID) | ORAL | 0 refills | Status: AC
  Filled 2022-08-22: qty 4, 2d supply, fill #0

## 2022-08-22 NOTE — TOC Transition Note (Signed)
Transition of Care [facility]) - CM/SW Discharge Note   Patient Details  Name: Anne Wolf MRN: [id number] Date of Birth: 26-Apr-1933  Transition of Care [facility]) CM/SW Contact:  Kermit Balo, RN Phone Number: 08/22/2022, 11:29 AM   Clinical Narrative:    Pt is discharging home with home health services through [place]. Information on the AVS.  Medications for home to be delivered to the room per [facility] pharmacy.  Spouse to provide transport home.   Final next level of care: Home w Home Health Services Barriers to Discharge: No Barriers Identified   Patient Goals and CMS Choice CMS Medicare.gov Compare Post Acute Care list provided to:: Patient Represenative (must comment) Choice offered to / list presented to : Spouse  Discharge Placement                         Discharge Plan and Services Additional resources added to the After Visit Summary for     Discharge Planning Services: CM Consult Post Acute Care Choice: Home Health                    HH Arranged: PT, OT [facility] Agency: [facility] Date [facility] Agency Contacted: 08/18/22   Representative spoke with at [facility] Agency: Kandee Keen  Social Determinants of Health (SDOH) Interventions SDOH Screenings   Food Insecurity: No Food Insecurity (08/18/2022)  Housing: Low Risk  (08/18/2022)  Transportation Needs: No Transportation Needs (08/18/2022)  Depression (PHQ2-9): Low Risk  (04/06/2021)  Tobacco Use: Low Risk  (08/15/2022)     Readmission Risk Interventions    06/01/2021    1:40 PM  Readmission Risk Prevention Plan  Post Dischage Appt Complete  Medication Screening Complete  Transportation Screening Complete

## 2022-08-22 NOTE — Discharge Summary (Signed)
Physician Discharge Summary   Patient: Anne Wolf MRN: [id number] DOB: Jan 22, 1934  Admit date:     08/15/2022  Discharge date: 08/22/22  Discharge Physician: Lynden Oxford  PCP: Carylon Perches, MD  Recommendations at discharge: Follow-up with PCP in 1 week.   Follow-up Information     Care, [facility] Follow up.   Specialty: Home Health Services Why: The home health agency will contact you for the first home visit. Contact information: [address] [place] [address] [phone]         Carylon Perches, MD. Schedule an appointment as soon as possible for a visit in 1 week(s).   Specialty: Internal Medicine Why: with BMP Contact information: [address] [place] [address] [phone]         Windell Norfolk, MD. Go on 10/16/2022.   Specialty: Neurology Contact information: [address] [place] [address] [phone]         Antoine Poche, MD. Schedule an appointment as soon as possible for a visit in 2 week(s).   Specialty: Cardiology Contact information: [address] [place] [address] [phone]                Discharge Diagnoses: Principal Problem:   Status epilepticus (HCC) Active Problems:   Mixed hyperlipidemia   Long term (current) use of anticoagulants   Essential hypertension   Type 2 diabetes mellitus (HCC)   S/P TAVR (transcatheter aortic valve replacement)   Acute respiratory failure with hypoxia (HCC)   Aspiration pneumonia (HCC)   Dysphagia  Brief hospital course: PMH of CVA, seizure, and A-fib, type II DM, aortic stenosis present to the [facility], ED as she was found down by her husband at home.  While in the ED patient had seizures of left upper extremity and she was intubated for airway protection. Transferred to [facility] for ICU care and EEG monitoring. Neurology was consulted. Transfer out of the ICU on 6/28.  Assessment and Plan: Status  epilepticus. History of seizures. Presents with witnessed seizures and unwitnessed fall. Neurology was consulted. Underwent LTM EEG monitoring. Currently on Keppra. No further seizures. No further change in therapy recommended by neurology.  Acute CVA. Awaiting guidance from neurology although personally discussed with the neurologist on 6/28. Suspect these are incidental findings and does not explain patient presentation. Already on anticoagulation with Eliquis. PT OT following as well. SLP following. Recent echocardiogram showed preserved EF no valve abnormality.  Paroxysmal A-fib. Currently rate controlled. On Lopressor. On Eliquis. Monitor.  Hypotension. Related to sedation. Patient was requiring some pressors peripherally. Blood pressure medication on hold.  Hypokalemia hypomagnesemia. Currently being replaced.  Alzheimer's dementia. Likely cause of seizures. Continue Aricept.  Diabetes mellitus, controlled without long-term insulin use without complication. Hemoglobin A1c 6.4. Currently on sliding scale insulin.  Aspiration pneumonia. Concern for dysphagia. Acute hypoxic respiratory failure SLP consulted. Blood cultures are negative.  Now on oral antibiotic.  Total 10-day treatment course recommended. Speech therapy following. Dysphagia 2 diet recommended. Responded well to IV Lasix.  Will initiate oral Lasix.  currently on room air.  Obesity Body mass index is 31.08 kg/m.  Placing the pt at higher risk of poor outcomes. Consultants:  Palliative care  Neurology Primary admission with the ICU  Procedures performed:  LTM EEG Intubation and extubation  DISCHARGE MEDICATION: Allergies as of 08/22/2022       Reactions   Cat Hair Extract Other (See Comments)   Red eyes  and Sneezing Also allergic to Dogs        Medication List     STOP taking these medications    lisinopril 10 MG tablet Commonly known as: ZESTRIL   metFORMIN 500 MG  tablet Commonly known as: GLUCOPHAGE       TAKE these medications    alendronate 70 MG tablet Commonly known as: FOSAMAX Take 70 mg by mouth once a week.   amoxicillin-clavulanate 875-125 MG tablet Commonly known as: AUGMENTIN Take 1 tablet by mouth 2 (two) times daily for 2 days.   apixaban 5 MG Tabs tablet Commonly known as: ELIQUIS Take 1 tablet (5 mg total) by mouth 2 (two) times daily.   docusate sodium 100 MG capsule Commonly known as: COLACE Take 1 capsule (100 mg total) by mouth 2 (two) times daily as needed for mild constipation.   donepezil 5 MG tablet Commonly known as: ARICEPT Take 1 tablet (5 mg total) by mouth at bedtime.   furosemide 20 MG tablet Commonly known as: Lasix Take 1 tablet (20 mg total) by mouth daily.   guaiFENesin 600 MG 12 hr tablet Commonly known as: MUCINEX Take 1 tablet (600 mg total) by mouth 2 (two) times daily.   levalbuterol 45 MCG/ACT inhaler Commonly known as: XOPENEX HFA Inhale 1 puff into the lungs 3 (three) times daily.   levETIRAcetam 750 MG tablet Commonly known as: KEPPRA Take 1 tablet (750 mg total) by mouth 2 (two) times daily. What changed:  medication strength how much to take   metoprolol succinate 25 MG 24 hr tablet Commonly known as: Toprol XL Take 2 tablets (50 mg total) by mouth daily.   potassium chloride SA 20 MEQ tablet Commonly known as: KLOR-CON M Take 1 tablet (20 mEq total) by mouth 2 (two) times daily.   rosuvastatin 20 MG tablet Commonly known as: CRESTOR Take 1 tablet (20 mg total) by mouth daily.   Vitamin D3 125 MCG (5000 UT) Caps Take 5,000 Units by mouth daily.       Disposition: Home Diet recommendation: Dysphagia type 2 thin liquid  Discharge Exam: Vitals:   08/22/22 0424 08/22/22 0835 08/22/22 1055 08/22/22 1056  BP: (!) 119/56 114/63    Pulse: 71 96    Resp: 18     Temp: 98.2 F (36.8 C) 98.4 F (36.9 C)    TempSrc: Oral Oral    SpO2: 94% 95% 92% 97%  Weight:       Height:       General: Appear in mild distress; no visible Abnormal Neck Mass Or lumps, Conjunctiva normal Cardiovascular: S1 and S2 Present, no Murmur, Respiratory: good respiratory effort, Bilateral Air entry present and basal Crackles, no wheezes Abdomen: Bowel Sound present, Non tender  Extremities: bilateral Pedal edema Neurology: alert and oriented to time, place, and person  Filed Weights   08/17/22 0500 08/19/22 0713 08/20/22 0717  Weight: 74.6 kg 74.6 kg 73.1 kg   Condition at discharge: stable  The results of significant diagnostics from this hospitalization (including imaging, microbiology, ancillary and laboratory) are listed below for reference.   Imaging Studies: DG CHEST PORT 1 VIEW  Result Date: 08/19/2022 CLINICAL DATA:  Shortness of breath EXAM: PORTABLE CHEST 1 VIEW COMPARISON:  Previous studies including the examination of 08/16/2022 FINDINGS: Transverse diameter of heart is increased. There is prosthetic valve/stent in the region the aortic valve. There is interval removal of endotracheal tube and enteric tube. There is interval improvement in the aeration in right parahilar region  suggesting decreasing infiltrate. There is increased density in both lower lung fields suggesting small bilateral pleural effusions. There is no pneumothorax. IMPRESSION: There is improvement in aeration in right parahilar region suggesting decrease in atelectasis/pneumonia or decrease in asymmetric pulmonary edema. There are no new focal infiltrates or signs of alveolar pulmonary edema. Small bilateral pleural effusions. Electronically Signed   By: Ernie Avena M.D.   On: 08/19/2022 15:40   MR BRAIN WO CONTRAST  Result Date: 08/17/2022 CLINICAL DATA:  Seizures. EXAM: MRI HEAD WITHOUT CONTRAST TECHNIQUE: Multiplanar, multiecho pulse sequences of the brain and surrounding structures were obtained without intravenous contrast. COMPARISON:  Head CT August 15, 2022; MRI of the brain February 11, 2022. FINDINGS: Brain: Two punctate focus of restricted diffusion are seen in the left posterior parietal region (series 10, image 30). No hemorrhage, hydrocephalus, extra-axial collection or mass lesion. Remote right basal ganglia infarct. Mild patchy T2 hyperintensity of the periventricular white matter appear similar to prior MRI. Moderate parenchymal volume loss. Vascular: Normal flow voids. Skull and upper cervical spine: Normal marrow signal. Sinuses/Orbits: Negative. Other: None. IMPRESSION: 1. Two punctate focus of restricted diffusion in the left posterior parietal region, consistent with acute/subacute infarcts. 2. Remote right basal ganglia infarct. 3. Mild chronic microvascular ischemic changes of the periventricular white matter. 4. Moderate parenchymal volume loss. Electronically Signed   By: Baldemar Lenis M.D.   On: 08/17/2022 15:15   Overnight EEG with video  Result Date: 08/16/2022 Charlsie Quest, MD     08/17/2022 10:55 AM Patient Name: IONA BOSIER MRN: [id number] Epilepsy Attending: Charlsie Quest Referring Physician/Provider: Caryl Pina, MD Duration: 08/16/2022 0310 to 08/17/2022 0310 Patient history: 89 year old female with a history of stroke and seizures who was found unresponsive at home by husband, who suspects that she had a breakthrough seizure. EEG to evaluate for seizure. Level of alertness:  comatose-->awake, asleep AEDs during EEG study: LEV, propofol Technical aspects: This EEG study was done with scalp electrodes positioned according to the 10-20 International system of electrode placement. Electrical activity was reviewed with band pass filter of 1-70Hz , sensitivity of 7 uV/mm, display speed of 12mm/sec with a 60Hz  notched filter applied as appropriate. EEG data were recorded continuously and digitally stored.  Video monitoring was available and reviewed as appropriate. Description: At the beginning of the study, EEG showed continuous generalized 2-3  Hz delta slowing with overriding 15 to 18 Hz beta activity distributed symmetrically and diffusely. As sedation was weaned, EEG showed posterior dominant rhythm of 9Hz  activity of moderate voltage (25-35 uV) seen predominantly in posterior head regions, symmetric and reactive to eye opening and eye closing. Sleep was characterized by sleep spindles (12-14hz ), maximal fronto-central region. EEG also showed continuous 3 to 6 Hz theta-delta slowing in right temporal region. Hyperventilation and photic stimulation were not performed.   ABNORMALITY - Continuous slow, generalized and maximal right temporal IMPRESSION: This study was initially suggestive of severe diffuse encephalopathy likely related to sedation. As sedation was weaned, EEG improved and was suggestive of cortical dysfunction in right temporal region likely secondary to underlying structural abnormality/encephalomalacia. No seizures or epileptiform discharges were seen throughout the recording. Charlsie Quest   DG Chest Port 1 View  Result Date: 08/16/2022 CLINICAL DATA:  89 year old female with history of seizure and stroke status post intubation. EXAM: PORTABLE CHEST 1 VIEW COMPARISON:  Chest x-ray 08/15/2022. FINDINGS: An endotracheal tube is in place with tip 3.8 cm above the carina. A nasogastric tube  is seen extending into the stomach, however, the tip of the nasogastric tube extends below the lower margin of the image. Lung volumes are normal. Ill-defined opacity throughout the central aspect of the right mid to lower lung, concerning for developing pneumonia or sequela of aspiration. Bibasilar opacities are also noted, which suggest subsegmental atelectasis. Small bilateral pleural effusions. No pneumothorax. No evidence of pulmonary edema. Heart size is mildly enlarged. Upper mediastinal contours are within normal limits. Atherosclerotic calcifications in the thoracic aorta. Status post TAVR. IMPRESSION: 1. Support apparatus, as above. 2.  Worsening bibasilar areas of subsegmental atelectasis with superimposed small bilateral pleural effusions. There also appears to be extensive airspace consolidation developing throughout the central aspect of the right mid to lower lung concerning for developing pneumonia or sequela of recent aspiration. 3. Mild cardiomegaly. Electronically Signed   By: Trudie Reed M.D.   On: 08/16/2022 05:33   CT Head Wo Contrast  Result Date: 08/15/2022 CLINICAL DATA:  Seizure. EXAM: CT HEAD WITHOUT CONTRAST TECHNIQUE: Contiguous axial images were obtained from the base of the skull through the vertex without intravenous contrast. RADIATION DOSE REDUCTION: This exam was performed according to the departmental dose-optimization program which includes automated exposure control, adjustment of the mA and/or kV according to patient size and/or use of iterative reconstruction technique. COMPARISON:  Brain MRI dated 02/11/2022. FINDINGS: Brain: Mild age-related atrophy and chronic microvascular ischemic changes. Right basal ganglia old lacunar infarct and encephalomalacia. There is no acute intracranial hemorrhage. No mass effect or midline shift. No extra-axial fluid collection. Vascular: No hyperdense vessel or unexpected calcification. Skull: Normal. Negative for fracture or focal lesion. Sinuses/Orbits: Mild mucoperiosteal thickening of paranasal sinuses. No air-fluid level. The mastoid air cells are clear. Other: None IMPRESSION: 1. No acute intracranial pathology. 2. Mild age-related atrophy and chronic microvascular ischemic changes. Right basal ganglia old lacunar infarct and encephalomalacia. Electronically Signed   By: Elgie Collard M.D.   On: 08/15/2022 18:08   DG Chest Portable 1 View  Result Date: 08/15/2022 CLINICAL DATA:  verify OG tube placement EXAM: PORTABLE CHEST - 1 VIEW COMPARISON:  08/15/2022 FINDINGS: Endotracheal tube tip less than 2 cm above carina directed towards the right mainstem bronchus.  Gastric tube extends at least as far as the stomach. Low lung volumes with some increase in perihilar infiltrates or edema. Heart size and mediastinal contours are within normal limits. Post TAVR. No effusion. Visualized bones unremarkable. IMPRESSION: 1. Endotracheal tube tip less than 2 cm above carina directed towards the right mainstem bronchus, consider retracting at least 2 cm. 2. Gastric tube extends at least as far as the stomach. 3. Low lung volumes with some increase in perihilar infiltrates or edema. Electronically Signed   By: Corlis Leak M.D.   On: 08/15/2022 18:05   DG Chest Portable 1 View  Result Date: 08/15/2022 CLINICAL DATA:  Intubated EXAM: PORTABLE CHEST 1 VIEW COMPARISON:  08/19/2021 FINDINGS: Single frontal view of the chest demonstrates endotracheal tube overlying tracheal air column, tip 1.3 cm above carina. Cardiac silhouette is stable. Aortic valve prosthesis unchanged. No airspace disease, effusion, or pneumothorax. No acute bony abnormalities. IMPRESSION: 1. No complication after intubation. Endotracheal tube tip 1.3 cm above carina. 2. No acute airspace disease. Electronically Signed   By: Sharlet Salina M.D.   On: 08/15/2022 17:33    Microbiology: Results for orders placed or performed during the hospital encounter of 08/15/22  Blood culture (routine x 2)     Status: None   Collection Time: 08/15/22  6:12 PM   Specimen: BLOOD  Result Value Ref Range Status   Specimen Description BLOOD BLOOD LEFT FOREARM  Final   Special Requests   Final    BOTTLES DRAWN AEROBIC AND ANAEROBIC Blood Culture adequate volume   Culture   Final    NO GROWTH 5 DAYS Performed at [facility], [address], [place], [place] [address]    Report Status 08/20/2022 FINAL  Final  Blood culture (routine x 2)     Status: None   Collection Time: 08/15/22  6:12 PM   Specimen: BLOOD  Result Value Ref Range Status   Specimen Description BLOOD BLOOD LEFT HAND  Final   Special Requests   Final     BOTTLES DRAWN AEROBIC AND ANAEROBIC Blood Culture adequate volume   Culture   Final    NO GROWTH 5 DAYS Performed at [facility], [address] [place], [place] [address]    Report Status 08/20/2022 FINAL  Final  MRSA Next Gen by PCR, Nasal     Status: None   Collection Time: 08/16/22 12:13 AM   Specimen: Nasal Mucosa; Nasal Swab  Result Value Ref Range Status   MRSA by PCR Next Gen NOT DETECTED NOT DETECTED Final    Comment: (NOTE) The GeneXpert MRSA Assay (FDA approved for NASAL specimens only), is one component of a comprehensive MRSA colonization surveillance program. It is not intended to diagnose MRSA infection nor to guide or monitor treatment for MRSA infections. Test performance is not FDA approved in patients less than 38 years old. Performed at [facility], [address], [place], [place] [address]    Labs: CBC: Recent Labs  Lab 08/15/22 1704 08/15/22 1705 08/16/22 0434 08/18/22 0642 08/19/22 0412  WBC  --  13.1* 15.3* 11.3* 9.4  NEUTROABS  --  5.4  --   --   --   HGB 15.0 14.1 12.5 10.7* 10.8*  HCT 44.0 44.7 38.3 32.4* 33.0*  MCV  --  93.3 87.6 90.3 90.7  PLT  --  187 145* 108* 113*   Basic Metabolic Panel: Recent Labs  Lab 08/16/22 0434 08/17/22 0825 08/18/22 0642 08/19/22 0412 08/20/22 0554 08/21/22 0651  NA 140 139 139 136 137 136  K 2.7* 3.6 4.0 3.6 3.4* 3.2*  CL 106 108 101 102 102 97*  CO2 21* 22 24 25 27 30   GLUCOSE 149* 111* 109* 126* 117* 121*  BUN 11 11 10 12 11 8   CREATININE 0.94 0.60 0.63 0.70 0.56 0.73  CALCIUM 8.5* 8.2* 8.3* 8.2* 8.2* 9.0  MG 1.7 1.8  --  1.6* 1.7 1.8  PHOS 2.4* 2.4* 2.5  --   --   --    Liver Function Tests: Recent Labs  Lab 08/15/22 1705 08/17/22 0825 08/18/22 0642 08/19/22 0412 08/20/22 0554 08/21/22 0651  AST 39  --   --  30 41 43*  ALT 16  --   --  19 27 31   ALKPHOS 49  --   --  57 69 80  BILITOT 1.1  --   --  2.2* 2.4* 1.8*  PROT 8.1  --   --  5.3* 5.3* 5.9*  ALBUMIN 4.2 2.5* 2.5* 2.4*  2.3* 2.5*   CBG: Recent Labs  Lab 08/21/22 0627 08/21/22 1138 08/21/22 1624 08/21/22 2124 08/22/22 0611  GLUCAP 119* 137* 140* 92 119*    Discharge time spent: greater than 30 minutes.  Author: Lynden Oxford, MD  [facility]

## 2022-08-22 NOTE — Progress Notes (Signed)
Discharge instructions given. Patient verbalized understanding and all questions were answered.  ?

## 2022-08-22 NOTE — Progress Notes (Signed)
Physical Therapy Treatment Patient Details Name: Anne Wolf MRN: [id number] DOB: 25-Sep-1933 Today's Date: 08/22/2022   History of Present Illness Anne Wolf is an 89 y.o. female admitted 08/15/22 with seizure. Found down at home by spouse unresponsive.  Intubated on admission and extubated 08/16/22. Started on LTM EEG and head CT negative for acute changes.  PMH: afib on Coumadin, DM, HLD, CVA, cancer, CHF, aortic stenosis.    PT Comments  Great progress, ambulating with and without assistive device today. Safely navigating stairs with min guard, ambulating at supervision level. Husband reports cognition back to baseline. She is eager to return home. Great family support from husband who was present during session today. Will benefit from HHPT follow-up but likely quickly progress to OPPT for higher level functional training. Adequate for d/c from mobility standpoint when medically cleared.   SpO2 90-92% on RA while ambulating; reviewed IS use. HR intermittently tachy at rest and while ambulating with spontaneous return to baseline pace (139 high, 112 low.)     Assistance Recommended at Discharge Frequent or constant Supervision/Assistance  If plan is discharge home, recommend the following:  Can travel by private vehicle    A little help with walking and/or transfers;A little help with bathing/dressing/bathroom;Direct supervision/assist for medications management;Assist for transportation;Help with stairs or ramp for entrance;Assistance with cooking/housework      Equipment Recommendations  None recommended by PT (Husband will borrow RW from friend)    Recommendations for Other Services       Precautions / Restrictions Precautions Precautions: Fall Precaution Comments: seizure, watch HR and O2 Restrictions Weight Bearing Restrictions: No     Mobility  Bed Mobility Overal bed mobility: Needs Assistance Bed Mobility: Supine to Sit     Supine to sit: Supervision      General bed mobility comments: supervision for safety, no physical assist needed. requires extra time, cues to scoot to EOB before attempting to stand.    Transfers Overall transfer level: Needs assistance Equipment used: Rolling walker (2 wheels) Transfers: Sit to/from Stand Sit to Stand: Supervision           General transfer comment: Supervision for safety. Effortful but able to rise without physical assist this morning. Cues for technique and hand placement.    Ambulation/Gait Ambulation/Gait assistance: Min guard, Supervision Gait Distance (Feet): 175 Feet Assistive device: Rolling walker (2 wheels), None Gait Pattern/deviations: Step-through pattern, Drifts right/left Gait velocity: reduced Gait velocity interpretation: <1.31 ft/sec, indicative of household ambulator   General Gait Details: Stable with RW. Minor cues for proximity to device, especially with turns. No LOB while holding RW. Without AD pt at min guard level, slight instability but able to self correct. Cues for awareness. Able to recall room but needed directions to locat down correct hallway. No buckling or overt LOB.   Stairs Stairs: Yes Stairs assistance: Min guard Stair Management: Two rails, Step to pattern, Alternating pattern, Forwards Number of Stairs: 5 General stair comments: Educated on stair navigation. Pt unable to recall home set-up. Safely navigates without error, step-to and alternating step patterns intermittently. Min guard for safety. No LOB.   Wheelchair Mobility     Tilt Bed    Modified Rankin (Stroke Patients Only)       Balance Overall balance assessment: Needs assistance Sitting-balance support: No upper extremity supported, Feet supported Sitting balance-Leahy Scale: Fair     Standing balance support: No upper extremity supported Standing balance-Leahy Scale: Fair  Cognition Arousal/Alertness: Awake/alert Behavior During  Therapy: WFL for tasks assessed/performed Overall Cognitive Status: History of cognitive impairments - at baseline                                 General Comments: Husband reports pt at baseline. Hx of cognitive decline.        Exercises      General Comments General comments (skin integrity, edema, etc.): HR tachy intermittently up to 139 but quickly and spontaneously decreases to 112. SpO2 92% on RA while ambulating; at rest 95% on 1L. Rn notified.      Pertinent Vitals/Pain Pain Assessment Pain Assessment: No/denies pain    Home Living Family/patient expects to be discharged to:: Private residence Living Arrangements: Spouse/significant other Available Help at Discharge: Family;Available 24 hours/day Type of Home: House Home Access: Stairs to enter   [employer] of Steps: 2 at one entrance, 6 with rails at another   Home Layout: Multi-level;Able to live on main level with bedroom/bathroom Home Equipment: None      Prior Function            PT Goals (current goals can now be found in the care plan section) Acute Rehab PT Goals Patient Stated Goal: to return home PT Goal Formulation: With patient/family Time For Goal Achievement: 08/31/22 Potential to Achieve Goals: Good Progress towards PT goals: Progressing toward goals    Frequency    Min 4X/week      PT Plan Equipment recommendations need to be updated    Co-evaluation              AM-PAC PT "6 Clicks" Mobility   Outcome Measure  Help needed turning from your back to your side while in a flat bed without using bedrails?: A Little Help needed moving from lying on your back to sitting on the side of a flat bed without using bedrails?: A Little Help needed moving to and from a bed to a chair (including a wheelchair)?: A Little Help needed standing up from a chair using your arms (e.g., wheelchair or bedside chair)?: A Little Help needed to walk in hospital room?: A  Little Help needed climbing 3-5 steps with a railing? : A Lot 6 Click Score: 17    End of Session Equipment Utilized During Treatment: Gait belt Activity Tolerance: Patient tolerated treatment well Patient left: in chair;with call bell/phone within reach;with chair alarm set;with family/visitor present Nurse Communication: Mobility status (sats and tachy) PT Visit Diagnosis: Other abnormalities of gait and mobility (R26.89);Other symptoms and signs involving the nervous system (R29.898);Unsteadiness on feet (R26.81)     Time: 0960-4540 PT Time Calculation (min) (ACUTE ONLY): 30 min  Charges:    $Gait Training: 23-37 mins PT General Charges $$ ACUTE PT VISIT: 1 Visit                     Kathlyn [place], PT, DPT [facility]  Rehabilitation Services Physical Therapist Office: [phone] Website: [email or web address]    Berton Mount 08/22/2022, 10:20 AM

## 2022-08-27 DIAGNOSIS — G40901 Epilepsy, unspecified, not intractable, with status epilepticus: Secondary | ICD-10-CM | POA: Diagnosis not present

## 2022-08-27 DIAGNOSIS — Z6827 Body mass index (BMI) 27.0-27.9, adult: Secondary | ICD-10-CM | POA: Diagnosis not present

## 2022-08-27 DIAGNOSIS — E669 Obesity, unspecified: Secondary | ICD-10-CM | POA: Diagnosis not present

## 2022-08-27 DIAGNOSIS — I959 Hypotension, unspecified: Secondary | ICD-10-CM | POA: Diagnosis not present

## 2022-08-27 DIAGNOSIS — Z9181 History of falling: Secondary | ICD-10-CM | POA: Diagnosis not present

## 2022-08-27 DIAGNOSIS — E876 Hypokalemia: Secondary | ICD-10-CM | POA: Diagnosis not present

## 2022-08-27 DIAGNOSIS — Z7901 Long term (current) use of anticoagulants: Secondary | ICD-10-CM | POA: Diagnosis not present

## 2022-08-27 DIAGNOSIS — Z8673 Personal history of transient ischemic attack (TIA), and cerebral infarction without residual deficits: Secondary | ICD-10-CM | POA: Diagnosis not present

## 2022-08-27 DIAGNOSIS — J9811 Atelectasis: Secondary | ICD-10-CM | POA: Diagnosis not present

## 2022-08-27 DIAGNOSIS — E782 Mixed hyperlipidemia: Secondary | ICD-10-CM | POA: Diagnosis not present

## 2022-08-27 DIAGNOSIS — Z952 Presence of prosthetic heart valve: Secondary | ICD-10-CM | POA: Diagnosis not present

## 2022-08-27 DIAGNOSIS — J90 Pleural effusion, not elsewhere classified: Secondary | ICD-10-CM | POA: Diagnosis not present

## 2022-09-01 DIAGNOSIS — G40509 Epileptic seizures related to external causes, not intractable, without status epilepticus: Secondary | ICD-10-CM | POA: Diagnosis not present

## 2022-09-01 DIAGNOSIS — Z8673 Personal history of transient ischemic attack (TIA), and cerebral infarction without residual deficits: Secondary | ICD-10-CM | POA: Diagnosis not present

## 2022-09-01 DIAGNOSIS — J189 Pneumonia, unspecified organism: Secondary | ICD-10-CM | POA: Diagnosis not present

## 2022-09-25 DIAGNOSIS — E1129 Type 2 diabetes mellitus with other diabetic kidney complication: Secondary | ICD-10-CM | POA: Diagnosis not present

## 2022-09-25 DIAGNOSIS — I700 Atherosclerosis of aorta: Secondary | ICD-10-CM | POA: Diagnosis not present

## 2022-09-25 DIAGNOSIS — G309 Alzheimer's disease, unspecified: Secondary | ICD-10-CM | POA: Diagnosis not present

## 2022-09-25 DIAGNOSIS — Z79899 Other long term (current) drug therapy: Secondary | ICD-10-CM | POA: Diagnosis not present

## 2022-09-25 DIAGNOSIS — I6300 Cerebral infarction due to thrombosis of unspecified precerebral artery: Secondary | ICD-10-CM | POA: Diagnosis not present

## 2022-09-26 DIAGNOSIS — J9811 Atelectasis: Secondary | ICD-10-CM | POA: Diagnosis not present

## 2022-09-26 DIAGNOSIS — Z8673 Personal history of transient ischemic attack (TIA), and cerebral infarction without residual deficits: Secondary | ICD-10-CM | POA: Diagnosis not present

## 2022-09-26 DIAGNOSIS — E669 Obesity, unspecified: Secondary | ICD-10-CM | POA: Diagnosis not present

## 2022-09-26 DIAGNOSIS — Z7901 Long term (current) use of anticoagulants: Secondary | ICD-10-CM | POA: Diagnosis not present

## 2022-09-26 DIAGNOSIS — I959 Hypotension, unspecified: Secondary | ICD-10-CM | POA: Diagnosis not present

## 2022-09-26 DIAGNOSIS — E782 Mixed hyperlipidemia: Secondary | ICD-10-CM | POA: Diagnosis not present

## 2022-09-26 DIAGNOSIS — J90 Pleural effusion, not elsewhere classified: Secondary | ICD-10-CM | POA: Diagnosis not present

## 2022-09-26 DIAGNOSIS — Z9181 History of falling: Secondary | ICD-10-CM | POA: Diagnosis not present

## 2022-09-26 DIAGNOSIS — E876 Hypokalemia: Secondary | ICD-10-CM | POA: Diagnosis not present

## 2022-09-26 DIAGNOSIS — G40901 Epilepsy, unspecified, not intractable, with status epilepticus: Secondary | ICD-10-CM | POA: Diagnosis not present

## 2022-09-26 DIAGNOSIS — Z6827 Body mass index (BMI) 27.0-27.9, adult: Secondary | ICD-10-CM | POA: Diagnosis not present

## 2022-09-26 DIAGNOSIS — Z952 Presence of prosthetic heart valve: Secondary | ICD-10-CM | POA: Diagnosis not present

## 2022-10-16 ENCOUNTER — Ambulatory Visit (INDEPENDENT_AMBULATORY_CARE_PROVIDER_SITE_OTHER): Payer: No Typology Code available for payment source | Admitting: Neurology

## 2022-10-16 ENCOUNTER — Encounter: Payer: Self-pay | Admitting: Neurology

## 2022-10-16 VITALS — BP 118/68 | Ht 62.0 in | Wt 150.0 lb

## 2022-10-16 DIAGNOSIS — F01A0 Vascular dementia, mild, without behavioral disturbance, psychotic disturbance, mood disturbance, and anxiety: Secondary | ICD-10-CM | POA: Diagnosis not present

## 2022-10-16 DIAGNOSIS — I639 Cerebral infarction, unspecified: Secondary | ICD-10-CM | POA: Diagnosis not present

## 2022-10-16 DIAGNOSIS — G40909 Epilepsy, unspecified, not intractable, without status epilepticus: Secondary | ICD-10-CM

## 2022-10-16 MED ORDER — LEVETIRACETAM 750 MG PO TABS: 750.0000 mg | ORAL_TABLET | Freq: Two times a day (BID) | ORAL | 3 refills | Status: AC

## 2022-10-16 MED ORDER — DONEPEZIL HCL 5 MG PO TABS: 5.0000 mg | ORAL_TABLET | Freq: Every day | ORAL | 3 refills | Status: AC

## 2022-10-16 NOTE — Patient Instructions (Signed)
Continue with Aricept 5 mg nightly  Continue with Keppra 750 mg twice daily  Continue your other medications  Return in 3 months or sooner if worse

## 2022-10-16 NOTE — Progress Notes (Signed)
[facility]  PATIENT: Anne Wolf DOB: 08/26/1933  REQUESTING CLINICIAN: Carylon Perches, MD HISTORY FROM: Patient and husband  REASON FOR VISIT: New onset seizure    HISTORICAL  CHIEF COMPLAINT:  Chief Complaint  Patient presents with   Follow-up    Rm12, with husband Anne Wolf, stroke, and pneumonia in 07/2022. Denies SI/HI,    INTERVAL HISTORY 10/16/2022: Patient presents today for follow-up, she is accompanied by her husband.  Last visit was in May, at that time plan was to continue with Keppra 500 mg twice daily and Aricept 5 mg nightly.  Unfortunately in June she was admitted to the hospital for status epilepticus.  Husband found her on the sofa unresponsive, being stiff and foaming at the mouth.  She was taken to the ED.  In the ED she was also noted to have a second seizure therefore intubated.  Her EEG showed diffuse slowing and her Keppra was increased to 750 twice daily.  She was also found to have two new punctate strokes in the left parietal region.  Patient is on Eliquis, husband denies missing any dose of the medication.  Since discharge from the hospital she is back to her normal self, she has has not had any additional seizure or seizure activity.  She tolerates very well the increased dose of Keppra.   INTERVAL HISTORY 07/11/2022:  Patient presented for follow-up, she is accompanied by her husband.  Last visit was in February.  Since then she has been compliant with the Keppra 500 mg twice daily but husband reports patient having a seizure or seizure-like activity on April 15.  On that day hospital and found her slumped on the toilet and unresponsive.  She did not have any abnormal movement but EMS was called and patient taken to the hospital.  In the hospital she was back to her normal self and discharged home.  Few days later she presented to the hospital complaining of flank pain and diagnosed with kidney stone.  Husband reports that patient does not  drink a lot of water, she is almost always dehydrated and he has to force her to drink water.   Her main concern today is her memory. Husband repeats even though patient is independent in her ADLs, she relies on him a lot. She is repetitive, forgetful and her short term memory is very poor. Patient feels like her memory is not what it used to be, that not a big problem for her. She denies any family history of dementia.    HISTORY OF PRESENT ILLNESS:  This is a 89 year old woman past medical history atrial fibrillation, hypertension, hyperlipidemia, diabetes mellitus, and previous stroke in December 2022 who is presenting after being admitted to the hospital for new onset seizure in December 2023.  Per husband patient was at home, and he noted that he had a blank stare, was unresponsive and followed by generalized convulsion.  This lasted less than 5-minute.  She was confused afterward.  He took her to the hospital.  In the ED patient was noted to have a left hemiplegia with right gaze deviation.  Her CT head CT was negative for any acute bleed therefore she was given TKN.  Her MRI was negative for any acute stroke therefore seizure was considered.  She was started on Keppra 500 mg twice daily.  Since being on Keppra she has not had any additional event concerning for seizure.  While in the hospital her INR was low at 1.6 while  being on Coumadin.  Upon discharge she was switched to Eliquis.   Handedness: Right handed   Onset: February 10 2022  Seizure Type: Generalized convulsion  Current frequency: Only once   Any injuries from seizures: Denies   Seizure risk factors: Stroke   Previous ASMs: None   Currenty ASMs: Levetiracetam 750 mg twice daily   ASMs side effects: Denies   Brain Images: No acute abnormality, previous right basal ganglia stroke   Previous EEGs: Normal EEG    OTHER MEDICAL CONDITIONS: Atrial fibrillation, Hypertension, Hyperlipidemia, Diabetes, Seizure disorder    REVIEW OF SYSTEMS: Full 14 system review of systems performed and negative with exception of: As noted in the HPI   ALLERGIES: Allergies  Allergen Reactions   Cat Hair Extract Other (See Comments)    Red eyes and Sneezing Also allergic to Dogs    HOME MEDICATIONS: Outpatient Medications Prior to Visit  Medication Sig Dispense Refill   alendronate (FOSAMAX) 70 MG tablet Take 70 mg by mouth once a week.     apixaban (ELIQUIS) 5 MG TABS tablet Take 1 tablet (5 mg total) by mouth 2 (two) times daily. 60 tablet 11   Cholecalciferol (VITAMIN D3) 125 MCG (5000 UT) capsule Take 5,000 Units by mouth daily.     furosemide (LASIX) 20 MG tablet Take 1 tablet (20 mg total) by mouth daily. (Patient taking differently: Take 20 mg by mouth as directed. 1 tablet every other day) 30 tablet 0   metoprolol succinate (TOPROL XL) 25 MG 24 hr tablet Take 2 tablets (50 mg total) by mouth daily. 60 tablet 0   potassium chloride SA (KLOR-CON M) 20 MEQ tablet Take 1 tablet (20 mEq total) by mouth 2 (two) times daily. (Patient taking differently: Take 20 mEq by mouth daily.) 20 tablet 0   rosuvastatin (CRESTOR) 20 MG tablet Take 1 tablet (20 mg total) by mouth daily. 30 tablet 1   donepezil (ARICEPT) 5 MG tablet Take 1 tablet (5 mg total) by mouth at bedtime. 30 tablet 6   levETIRAcetam (KEPPRA) 750 MG tablet Take 1 tablet (750 mg total) by mouth 2 (two) times daily. 60 tablet 0   albuterol (VENTOLIN HFA) 108 (90 Base) MCG/ACT inhaler Inhale 1 puff into the lungs 3 (three) times daily. (Patient not taking: Reported on 10/16/2022) 6.7 g 0   docusate sodium (COLACE) 100 MG capsule Take 1 capsule (100 mg total) by mouth 2 (two) times daily as needed for mild constipation. (Patient not taking: Reported on 10/16/2022) 100 capsule 0   guaiFENesin (MUCINEX) 600 MG 12 hr tablet Take 1 tablet (600 mg total) by mouth 2 (two) times daily. (Patient not taking: Reported on 10/16/2022) 60 tablet 0   No facility-administered  medications prior to visit.    PAST MEDICAL HISTORY: Past Medical History:  Diagnosis Date   Aortic stenosis    mild; mild MR; slightly increased pulmonary artery pressure with mild RVH; normal LV-2010   Arthritis    Atrial fibrillation (HCC)    chronic anticoagulation; adequate HR control on minimal AV nodal blocking medication    Cancer (HCC)    skin cancers removed from face   CHF (congestive heart failure) (HCC)    Diabetes mellitus without complication (HCC)    2   Dyspnea    Dysrhythmia    Fasting hyperglycemia    + microalbuminuria; A1c of 6.5% in 11/2010   Hematochezia 01/2009   01/2009-presumed ischemic colitis; history of diverticulosis   History of kidney stones  Hx of adenomatous colonic polyps 2005   adenomatous polyp; negative colonoscopy in 2009   Hyperlipidemia    Seizure (HCC) 01/2022   Stroke [facility])     PAST SURGICAL HISTORY: Past Surgical History:  Procedure Laterality Date   CATARACT EXTRACTION W/PHACO Right 03/23/2014   Procedure: CATARACT EXTRACTION PHACO AND INTRAOCULAR LENS PLACEMENT (IOC);  Surgeon: Susa Simmonds, MD;  Location: [facility];  Service: Ophthalmology;  Laterality: Right;  CDE:2.83   CATARACT EXTRACTION W/PHACO Left 07/27/2014   Procedure: CATARACT EXTRACTION PHACO AND INTRAOCULAR LENS PLACEMENT LEFT EYE CDE=6.97;  Surgeon: Susa Simmonds, MD;  Location: [facility];  Service: Ophthalmology;  Laterality: Left;   COLONOSCOPY  2009   negative; prior study with adenomatous polyp   CYSTOSCOPY WITH RETROGRADE PYELOGRAM, URETEROSCOPY AND STENT PLACEMENT Left 04/27/2022   Procedure: CYSTOSCOPY WITH RETROGRADE PYELOGRAM, URETEROSCOPY AND STENT PLACEMENT;  Surgeon: Malen Gauze, MD;  Location: [facility];  Service: Urology;  Laterality: Left;   CYSTOSCOPY/URETEROSCOPY/HOLMIUM LASER/STENT PLACEMENT Right 08/21/2016   Procedure: CYSTOSCOPY/URETEROSCOPY/RETROGRADE PYELOGRAM//STENT PLACEMENT;  Surgeon: Hildred Laser, MD;  Location: [facility];  Service:  Urology;  Laterality: Right;   EYE SURGERY     HOLMIUM LASER APPLICATION Left 04/27/2022   Procedure: HOLMIUM LASER APPLICATION;  Surgeon: Malen Gauze, MD;  Location: [facility];  Service: Urology;  Laterality: Left;   INTRAOPERATIVE TRANSTHORACIC ECHOCARDIOGRAM N/A 05/31/2021   Procedure: INTRAOPERATIVE TRANSTHORACIC ECHOCARDIOGRAM;  Surgeon: Tonny Bollman, MD;  Location: [facility] OR;  Service: Open Heart Surgery;  Laterality: N/A;   RIGHT/LEFT HEART CATH AND CORONARY ANGIOGRAPHY N/A 02/04/2021   Procedure: RIGHT/LEFT HEART CATH AND CORONARY ANGIOGRAPHY;  Surgeon: Tonny Bollman, MD;  Location: [facility] INVASIVE CV LAB;  Service: Cardiovascular;  Laterality: N/A;   TRANSCATHETER AORTIC VALVE REPLACEMENT, TRANSFEMORAL N/A 05/31/2021   Procedure: Transcatheter Aortic Valve Replacement 23mm, Transfemoral;  Surgeon: Tonny Bollman, MD;  Location: [facility] OR;  Service: Open Heart Surgery;  Laterality: N/A;  Percutaneous    FAMILY HISTORY: Family History  Problem Relation Age of Onset   Cancer Father    Arrhythmia Sister        Atrial fibrillation    SOCIAL HISTORY: Social History   Socioeconomic History   Marital status: Married    Spouse name: Not on file   Number of children: 2   Years of education: Not on file   Highest education level: Not on file  Occupational History   Occupation: Retired  Tobacco Use   Smoking status: Never    Passive exposure: Never   Smokeless tobacco: Never  Vaping Use   Vaping status: Never Used  Substance and Sexual Activity   Alcohol use: No   Drug use: No   Sexual activity: Yes  Other Topics Concern   Not on file  Social History Narrative   Married with 2 childrenNo regular exercise   Right handed   Caffeine   1 cup daily occasionally   Social Determinants of Health   Financial Resource Strain: Not on file  Food Insecurity: No Food Insecurity (08/18/2022)   Hunger Vital Sign    Worried About Running Out of Food in the Last Year: Never true    Ran  Out of Food in the Last Year: Never true  Transportation Needs: No Transportation Needs (08/18/2022)   PRAPARE - Administrator, Civil Service (Medical): No    Lack of Transportation (Non-Medical): No  Physical Activity: Not on file  Stress: Not on file  Social Connections: Not on file  Intimate Partner Violence: Patient Unable To Answer (08/18/2022)   Humiliation, Afraid, Rape, and Kick questionnaire    Fear of Current or Ex-Partner: Patient unable to answer    Emotionally Abused: Patient unable to answer    Physically Abused: Patient unable to answer    Sexually Abused: Patient unable to answer    PHYSICAL EXAM  GENERAL EXAM/CONSTITUTIONAL: Vitals:  Vitals:   10/16/22 1100  BP: 118/68  Weight: 150 lb (68 kg)  Height: 5\' 2"  (1.575 m)   Body mass index is 27.44 kg/m. Wt Readings from Last 3 Encounters:  10/16/22 150 lb (68 kg)  08/20/22 161 lb 2.5 oz (73.1 kg)  07/11/22 153 lb 8 oz (69.6 kg)   Patient is in no distress; well developed, nourished and groomed; neck is supple  MUSCULOSKELETAL: Gait, strength, tone, movements noted in Neurologic exam below  NEUROLOGIC: MENTAL STATUS:      No data to display            07/11/2022   11:13 AM  Montreal Cognitive Assessment   Visuospatial/ Executive (0/5) 2  Naming (0/3) 3  Attention: Read list of digits (0/2) 2  Attention: Read list of letters (0/1) 1  Attention: Serial 7 subtraction starting at 100 (0/3) 1  Language: Repeat phrase (0/2) 1  Language : Fluency (0/1) 0  Abstraction (0/2) 2  Delayed Recall (0/5) 0  Orientation (0/6) 1  Total 13    CRANIAL NERVE:  2nd, 3rd, 4th, 6th - Visual fields full to confrontation, extraocular muscles intact, no nystagmus 5th - facial sensation symmetric 7th - facial strength symmetric 8th - hearing intact 9th - palate elevates symmetrically, uvula midline 11th - shoulder shrug symmetric 12th - tongue protrusion midline  MOTOR:  normal bulk and tone, full  strength in the BUE, BLE  SENSORY:  normal and symmetric to light touch  COORDINATION:  finger-nose-finger, fine finger movements normal  GAIT/STATION:  normal   DIAGNOSTIC DATA (LABS, IMAGING, TESTING) - I reviewed patient records, labs, notes, testing and imaging myself where available.  Lab Results  Component Value Date   WBC 9.4 08/19/2022   HGB 10.8 (L) 08/19/2022   HCT 33.0 (L) 08/19/2022   MCV 90.7 08/19/2022   PLT 113 (L) 08/19/2022      Component Value Date/Time   NA 136 08/21/2022 0651   NA 144 01/18/2021 1655   K 3.2 (L) 08/21/2022 0651   CL 97 (L) 08/21/2022 0651   CO2 30 08/21/2022 0651   GLUCOSE 121 (H) 08/21/2022 0651   BUN 8 08/21/2022 0651   BUN 10 01/18/2021 1655   CREATININE 0.73 08/21/2022 0651   CALCIUM 9.0 08/21/2022 0651   PROT 5.9 (L) 08/21/2022 0651   ALBUMIN 2.5 (L) 08/21/2022 0651   AST 43 (H) 08/21/2022 0651   ALT 31 08/21/2022 0651   ALKPHOS 80 08/21/2022 0651   BILITOT 1.8 (H) 08/21/2022 0651   GFRNONAA >60 08/21/2022 0651   GFRAA >60 11/28/2017 1127   Lab Results  Component Value Date   CHOL 183 02/11/2022   HDL 58 02/11/2022   LDLCALC 112 (H) 02/11/2022   TRIG 64 02/11/2022   Lab Results  Component Value Date   HGBA1C 6.4 (H) 04/25/2022   No results found for: "VITAMINB12" Lab Results  Component Value Date   TSH 2.32 03/24/2008    MRI Brain 02/11/2022 No acute intracranial pathology. No acute hemorrhage or infarct identified.   Routine EEG 02/11/2022 Normal EEGs, however, do not rule out epilepsy.  LTM 08/16/2022 - Continuous slow, generalized and maximal right temporal      ASSESSMENT AND PLAN  89 y.o. year old female  with history of atrial fibrillation, hypertension, hyperlipidemia, diabetes, previous stroke in December 2022, Vascular dementia and seizure disorder  who is presenting for follow up.  For her dementia, will continue patient on Aricept, and for seizures, will continue the Keppra 750 mg twice  daily.  I did advise husband to contact me for any other worsening symptoms or worsening side effects.  I will see them in 4 to 5 months for follow-up or sooner if worse.   1. Mild vascular dementia without behavioral disturbance, psychotic disturbance, mood disturbance, or anxiety (HCC)   2. Seizure disorder (HCC)   3. Cerebrovascular accident (CVA), unspecified mechanism (HCC)       Patient Instructions  Continue with Aricept 5 mg nightly  Continue with Keppra 750 mg twice daily  Continue your other medications  Return in 3 months or sooner if worse   Per [facility] statutes, patients with seizures are not allowed to drive until they have been seizure-free for six months.  Other recommendations include using caution when using heavy equipment or power tools. Avoid working on ladders or at heights. Take showers instead of baths.  Do not swim alone.  Ensure the water temperature is not too high on the home water heater. Do not go swimming alone. Do not lock yourself in a room alone (i.e. bathroom). When caring for infants or small children, sit down when holding, feeding, or changing them to minimize risk of injury to the child in the event you have a seizure. Maintain good sleep hygiene. Avoid alcohol.  Also recommend adequate sleep, hydration, good diet and minimize stress.   During the Seizure  - First, ensure adequate ventilation and place patients on the floor on their left side  Loosen clothing around the neck and ensure the airway is patent. If the patient is clenching the teeth, do not force the mouth open with any object as this can cause severe damage - Remove all items from the surrounding that can be hazardous. The patient may be oblivious to what's happening and may not even know what he or she is doing. If the patient is confused and wandering, either gently guide him/her away and block access to outside areas - Reassure the individual and be comforting - Call 911. In  most cases, the seizure ends before EMS arrives. However, there are cases when seizures may last over 3 to 5 minutes. Or the individual may have developed breathing difficulties or severe injuries. If a pregnant patient or a person with diabetes develops a seizure, it is prudent to call an ambulance. - Finally, if the patient does not regain full consciousness, then call EMS. Most patients will remain confused for about 45 to 90 minutes after a seizure, so you must use judgment in calling for help. - Avoid restraints but make sure the patient is in a bed with padded side rails - Place the individual in a lateral position with the neck slightly flexed; this will help the saliva drain from the mouth and prevent the tongue from falling backward - Remove all nearby furniture and other hazards from the area - Provide verbal assurance as the individual is regaining consciousness - Provide the patient with privacy if possible - Call for help and start treatment as ordered by the caregiver   After the Seizure (Postictal Stage)  After a seizure,  most patients experience confusion, fatigue, muscle pain and/or a headache. Thus, one should permit the individual to sleep. For the next few days, reassurance is essential. Being calm and helping reorient the person is also of importance.  Most seizures are painless and end spontaneously. Seizures are not harmful to others but can lead to complications such as stress on the lungs, brain and the heart. Individuals with prior lung problems may develop labored breathing and respiratory distress.     No orders of the defined types were placed in this encounter.   Meds ordered this encounter  Medications   donepezil (ARICEPT) 5 MG tablet    Sig: Take 1 tablet (5 mg total) by mouth at bedtime.    Dispense:  90 tablet    Refill:  3   levETIRAcetam (KEPPRA) 750 MG tablet    Sig: Take 1 tablet (750 mg total) by mouth 2 (two) times daily.    Dispense:  180 tablet     Refill:  3    Return in about 4 months (around 02/15/2023).    Windell Norfolk, MD 10/16/2022, 10:04 PM  [facility] [address] [place], [place] [address] [phone]

## 2022-10-17 DIAGNOSIS — Z1283 Encounter for screening for malignant neoplasm of skin: Secondary | ICD-10-CM | POA: Diagnosis not present

## 2022-10-17 DIAGNOSIS — L570 Actinic keratosis: Secondary | ICD-10-CM | POA: Diagnosis not present

## 2022-10-17 DIAGNOSIS — Z85828 Personal history of other malignant neoplasm of skin: Secondary | ICD-10-CM | POA: Diagnosis not present

## 2022-10-17 DIAGNOSIS — D485 Neoplasm of uncertain behavior of skin: Secondary | ICD-10-CM | POA: Diagnosis not present

## 2022-10-24 DIAGNOSIS — Z79899 Other long term (current) drug therapy: Secondary | ICD-10-CM | POA: Diagnosis not present

## 2022-10-24 DIAGNOSIS — J90 Pleural effusion, not elsewhere classified: Secondary | ICD-10-CM | POA: Diagnosis not present

## 2022-10-24 DIAGNOSIS — I482 Chronic atrial fibrillation, unspecified: Secondary | ICD-10-CM | POA: Diagnosis not present

## 2022-10-24 DIAGNOSIS — G40001 Localization-related (focal) (partial) idiopathic epilepsy and epileptic syndromes with seizures of localized onset, not intractable, with status epilepticus: Secondary | ICD-10-CM | POA: Diagnosis not present

## 2022-10-24 DIAGNOSIS — E1129 Type 2 diabetes mellitus with other diabetic kidney complication: Secondary | ICD-10-CM | POA: Diagnosis not present

## 2022-10-25 ENCOUNTER — Ambulatory Visit: Payer: No Typology Code available for payment source | Admitting: Internal Medicine

## 2022-10-31 DIAGNOSIS — E785 Hyperlipidemia, unspecified: Secondary | ICD-10-CM | POA: Diagnosis not present

## 2022-10-31 DIAGNOSIS — G40509 Epileptic seizures related to external causes, not intractable, without status epilepticus: Secondary | ICD-10-CM | POA: Diagnosis not present

## 2022-10-31 DIAGNOSIS — E1129 Type 2 diabetes mellitus with other diabetic kidney complication: Secondary | ICD-10-CM | POA: Diagnosis not present

## 2022-10-31 DIAGNOSIS — J90 Pleural effusion, not elsewhere classified: Secondary | ICD-10-CM | POA: Diagnosis not present

## 2022-11-30 NOTE — Progress Notes (Signed)
Cardiology Office Note:  .   Date:  12/04/2022  ID:  [name], DOB 03-26-1933, MRN [id number] PCP: Carylon Perches, MD  [facility] Providers Cardiologist:  Dina Rich, MD    History of Present Illness: .   LIALA SEALES is a 89 y.o. female with history of PAF on eliquis, AS S/P TAVR 2023, CVA, HLD, seizure disorder dementia.  Patient admitted in May with status epilepticus 5/204 and CT showed new CVA while on Eliquis. Keppra increased by neuro.  Patient here with her husband. Denies palpitations, chest pain, dyspnea, edema. Not very active. Some days she doesn't have much energy. They didn't bring her meds and say Dr. Ouida Sills stopped 3 meds last month.He went to get them and stopped lasix and K and albuterol.  ROS:    Studies Reviewed: Marland Kitchen         Prior CV Studies:   01/2021 RHC/LHC   There is moderate aortic valve stenosis.   1.  Patent coronary arteries with no significant stenoses.  Mild calcification noted. 2.  Moderate to severe aortic stenosis with mean transvalvular gradient 28 mmHg and calculated aortic valve area 1.1 cm 3.  Normal right heart hemodynamics   Recommend: CT angiography studies, valve team review, consideration of further treatment options might include commercial TAVR versus enrollment in the Progress moderate aortic stenosis TAVR trial.  06/2021 echo 1. Left ventricular ejection fraction, by estimation, is 60 to 65%. The  left ventricle has normal function. The left ventricle has no regional  wall motion abnormalities. There is moderate left ventricular hypertrophy  of the basal-septal segment. Left  ventricular diastolic function could not be evaluated.   2. Right ventricular systolic function is normal. The right ventricular  size is normal. There is mildly elevated pulmonary artery systolic  pressure.   3. Left atrial size was severely dilated.   4. Right atrial size was severely dilated.   5. The mitral valve is degenerative.  Mild mitral valve regurgitation.  Mild to moderate mitral stenosis. The mean mitral valve gradient is 4.0  mmHg. Moderate to severe mitral annular calcification.   6. Tricuspid valve regurgitation is mild to moderate.   7. The aortic valve is normal in structure. Aortic valve regurgitation is  trivial. No aortic stenosis is present. There is a 23 mm Sapien prosthetic  (TAVR) valve present in the aortic position. Procedure Date: 05/31/21. Echo  findings are consistent with  normal structure and function of the aortic valve prosthesis. Aortic valve  area, by VTI measures 2.17 cm. Aortic valve mean gradient measures 7.2  mmHg. Aortic valve Vmax measures 1.80 m/s.   8. The inferior vena cava is normal in size with greater than 50%  respiratory variability, suggesting right atrial pressure of 3 mmHg.   Comparison(s): 06/01/21 EF 60-65%. Moderate MS mean PG, peak  PG. AV mean PG, PG. PA pressure .       Risk Assessment/Calculations:    CHA2DS2-VASc Score = 8   This indicates a 10.8% annual risk of stroke. The patient's score is based upon: CHF History: 0 HTN History: 1 Diabetes History: 1 Stroke History: 2 Vascular Disease History: 1 Age Score: 2 Gender Score: 1            Physical Exam:   VS:  BP 124/60   Pulse 68   Ht 5\' 2"  (1.575 m)   Wt 153 lb 9.6 oz (69.7 kg)   SpO2 95%   BMI 28.09  kg/m    Wt Readings from Last 3 Encounters:  12/04/22 153 lb 9.6 oz (69.7 kg)  10/16/22 150 lb (68 kg)  08/20/22 161 lb 2.5 oz (73.1 kg)    GEN: Well nourished, well developed in no acute distress NECK: No JVD; No carotid bruits CARDIAC:  RRR, no murmurs, rubs, gallops RESPIRATORY:  Clear to auscultation without rales, wheezing or rhonchi  ABDOMEN: Soft, non-tender, non-distended EXTREMITIES:  No edema; No deformity   ASSESSMENT AND PLAN: .     Afib - no palpitations - compliant with meds - no bleeding on eliquis.  -check bmet and cbc today -has  had hypokalemia but Dr. Ouida Sills stopped lasix and K. Repeat labs today.    Hyperlipidemia-01/2022 TC 183 TG 64 HDL 58 LDL 112 - had been on simvastatin, changed to crestor 01/2022 -managed by Dr. Ouida Sills    Aortic stenosis -  underwent successful TAVR with a 23 mm Edwards Sapien 3UR THV via the TF approach on 05/31/21.  06/2021 echo: LVEF 60-65%, normal AVR 01/2022 LVEF 60-65%, normal AVR. Mild to mod AS - no SOB/DOE, no chest pains.  -order echo with f/u with Ms. Janee Morn in Dec 2024   History of  CVA - admit 01/2021 with CVA  - from notes acute right basal ganglia CVA, thought to be cardioembolic in setting of subtherapeutic INR at 1.3 01/2021 -  had to be off coumadin aroudn that time for invasvive procedures/testing  -repeat CVA 06/2022 while on eliquis.    Seizures - followed by neuro -admit 01/2023 and 06/2022 with seziures,         Dispo: see above  Signed, Jacolyn Reedy, PA-C

## 2022-12-04 ENCOUNTER — Ambulatory Visit: Payer: No Typology Code available for payment source | Attending: Internal Medicine | Admitting: Physician Assistant

## 2022-12-04 ENCOUNTER — Encounter: Payer: Self-pay | Admitting: Physician Assistant

## 2022-12-04 ENCOUNTER — Telehealth: Payer: Self-pay

## 2022-12-04 ENCOUNTER — Other Ambulatory Visit (HOSPITAL_COMMUNITY)
Admission: RE | Admit: 2022-12-04 | Discharge: 2022-12-04 | Disposition: A | Discharge status: Home / Self Care | Payer: No Typology Code available for payment source | Source: Ambulatory Visit | Attending: Physician Assistant | Admitting: Physician Assistant

## 2022-12-04 ENCOUNTER — Ambulatory Visit: Payer: No Typology Code available for payment source | Admitting: Physician Assistant

## 2022-12-04 VITALS — BP 124/60 | HR 68 | Ht 62.0 in | Wt 153.6 lb

## 2022-12-04 DIAGNOSIS — I480 Paroxysmal atrial fibrillation: Secondary | ICD-10-CM | POA: Diagnosis not present

## 2022-12-04 DIAGNOSIS — Z952 Presence of prosthetic heart valve: Secondary | ICD-10-CM | POA: Diagnosis not present

## 2022-12-04 DIAGNOSIS — E785 Hyperlipidemia, unspecified: Secondary | ICD-10-CM

## 2022-12-04 DIAGNOSIS — Z87898 Personal history of other specified conditions: Secondary | ICD-10-CM

## 2022-12-04 DIAGNOSIS — E876 Hypokalemia: Secondary | ICD-10-CM

## 2022-12-04 DIAGNOSIS — Z8673 Personal history of transient ischemic attack (TIA), and cerebral infarction without residual deficits: Secondary | ICD-10-CM

## 2022-12-04 LAB — BASIC METABOLIC PANEL
Anion gap: 8 (ref 5–15)
BUN: 11 mg/dL (ref 8–23)
CO2: 29 mmol/L (ref 22–32)
Calcium: 8.9 mg/dL (ref 8.9–10.3)
Chloride: 100 mmol/L (ref 98–111)
Creatinine, Ser: 0.79 mg/dL (ref 0.44–1.00)
GFR, Estimated: >60 mL/min (ref >60)
Glucose, Bld: 111 mg/dL — ABNORMAL HIGH (ref 70–99)
Potassium: 3.2 mmol/L — ABNORMAL LOW (ref 3.5–5.1)
Sodium: 137 mmol/L (ref 135–145)

## 2022-12-04 LAB — CBC
HCT: 40.2 % (ref 36.0–46.0)
Hemoglobin: 12.9 g/dL (ref 12.0–15.0)
MCH: 28.6 pg (ref 26.0–34.0)
MCHC: 32.1 g/dL (ref 30.0–36.0)
MCV: 89.1 fL (ref 80.0–100.0)
Platelets: 204 10*3/uL (ref 150–400)
RBC: 4.51 MIL/uL (ref 3.87–5.11)
RDW: 13.5 % (ref 11.5–15.5)
WBC: 7.6 10*3/uL (ref 4.0–10.5)
nRBC: 0 % (ref 0.0–0.2)

## 2022-12-04 MED ORDER — POTASSIUM CHLORIDE CRYS ER 20 MEQ PO TBCR: 20.0000 meq | EXTENDED_RELEASE_TABLET | Freq: Every day | ORAL | 3 refills | Status: AC

## 2022-12-04 NOTE — Telephone Encounter (Signed)
-----   Message from Jacolyn Reedy sent at 12/04/2022  2:10 PM EDT ----- Potassium still low 3.2. resume Kdur 20 meq-2 today and tomorrow then once daily. Repeat bmet in 2 weeks. thanks

## 2022-12-04 NOTE — Patient Instructions (Addendum)
Medication Instructions:  Your physician recommends that you continue on your current medications as directed. Please refer to the Current Medication list given to you today.   Labwork: BMET,CBC Today at [facility]   Testing/Procedures: Echo in [place]  Follow-Up: After Echo in December with Briscoe Burns  Any Other Special Instructions Will Be Listed Below (If Applicable).  If you need a refill on your cardiac medications before your next appointment, please call your pharmacy.

## 2022-12-04 NOTE — Telephone Encounter (Signed)
I spoke with husband and he will pick up potassium 20 meq today from [facility] in [place]. He is instructed to give her 40 meq (2 tablets) today and tomorrow and then once a day thereafter.He will repeat bmet at [facility] in 2 weeks.

## 2022-12-18 ENCOUNTER — Telehealth: Payer: Self-pay | Admitting: Cardiology

## 2022-12-18 ENCOUNTER — Other Ambulatory Visit (HOSPITAL_COMMUNITY)
Admission: RE | Admit: 2022-12-18 | Discharge: 2022-12-18 | Disposition: A | Discharge status: Home / Self Care | Payer: No Typology Code available for payment source | Source: Ambulatory Visit | Attending: Physician Assistant | Admitting: Physician Assistant

## 2022-12-18 DIAGNOSIS — E876 Hypokalemia: Secondary | ICD-10-CM | POA: Diagnosis not present

## 2022-12-18 LAB — BASIC METABOLIC PANEL
Anion gap: 10 (ref 5–15)
BUN: 14 mg/dL (ref 8–23)
CO2: 26 mmol/L (ref 22–32)
Calcium: 9.3 mg/dL (ref 8.9–10.3)
Chloride: 101 mmol/L (ref 98–111)
Creatinine, Ser: 0.87 mg/dL (ref 0.44–1.00)
GFR, Estimated: >60 mL/min (ref >60)
Glucose, Bld: 188 mg/dL — ABNORMAL HIGH (ref 70–99)
Potassium: 4 mmol/L (ref 3.5–5.1)
Sodium: 137 mmol/L (ref 135–145)

## 2022-12-18 NOTE — Telephone Encounter (Signed)
Husband returned RN's call regarding test results.

## 2022-12-19 NOTE — Telephone Encounter (Signed)
Results discussed with husband.Will continue all medications.

## 2022-12-19 NOTE — Telephone Encounter (Signed)
Dyann Kief, PA-C   Potassium normal. Glucose high 188. No changes thanks

## 2023-01-03 DIAGNOSIS — E119 Type 2 diabetes mellitus without complications: Secondary | ICD-10-CM | POA: Diagnosis not present

## 2023-01-03 DIAGNOSIS — H1045 Other chronic allergic conjunctivitis: Secondary | ICD-10-CM | POA: Diagnosis not present

## 2023-01-03 DIAGNOSIS — H43812 Vitreous degeneration, left eye: Secondary | ICD-10-CM | POA: Diagnosis not present

## 2023-01-03 DIAGNOSIS — H04123 Dry eye syndrome of bilateral lacrimal glands: Secondary | ICD-10-CM | POA: Diagnosis not present

## 2023-01-25 ENCOUNTER — Ambulatory Visit (HOSPITAL_COMMUNITY)
Admission: RE | Admit: 2023-01-25 | Discharge: 2023-01-25 | Disposition: A | Discharge status: Home / Self Care | Payer: No Typology Code available for payment source | Source: Ambulatory Visit | Attending: Cardiology | Admitting: Cardiology

## 2023-01-25 DIAGNOSIS — Z952 Presence of prosthetic heart valve: Secondary | ICD-10-CM | POA: Insufficient documentation

## 2023-01-25 LAB — ECHOCARDIOGRAM COMPLETE
AR max vel: 3.43 cm2
AV Area VTI: 3.09 cm2
AV Area mean vel: 3.37 cm2
AV Mean grad: 8 mm[Hg]
AV Peak grad: 11.6 mm[Hg]
Ao pk vel: 1.7 m/s
Area-P 1/2: 2.32 cm2
Calc EF: 64.3 %
MV VTI: 2.42 cm2
S' Lateral: 2.1 cm
Single Plane A2C EF: 59.3 %
Single Plane A4C EF: 69.1 %

## 2023-01-25 NOTE — Progress Notes (Signed)
  Echocardiogram 2D Echocardiogram has been performed.  Anne Wolf 01/25/2023, 10:26 AM

## 2023-01-29 ENCOUNTER — Encounter (HOSPITAL_COMMUNITY): Payer: Self-pay

## 2023-01-29 ENCOUNTER — Emergency Department (HOSPITAL_COMMUNITY): Payer: No Typology Code available for payment source

## 2023-01-29 ENCOUNTER — Inpatient Hospital Stay (HOSPITAL_COMMUNITY)
Admission: EM | Admit: 2023-01-29 | Discharge: 2023-02-21 | DRG: 963 | Disposition: E | Payer: No Typology Code available for payment source | Attending: Surgery | Admitting: Surgery

## 2023-01-29 DIAGNOSIS — Z7901 Long term (current) use of anticoagulants: Secondary | ICD-10-CM | POA: Diagnosis not present

## 2023-01-29 DIAGNOSIS — Z23 Encounter for immunization: Secondary | ICD-10-CM | POA: Diagnosis not present

## 2023-01-29 DIAGNOSIS — J942 Hemothorax: Secondary | ICD-10-CM

## 2023-01-29 DIAGNOSIS — D6832 Hemorrhagic disorder due to extrinsic circulating anticoagulants: Secondary | ICD-10-CM | POA: Diagnosis present

## 2023-01-29 DIAGNOSIS — T45515A Adverse effect of anticoagulants, initial encounter: Secondary | ICD-10-CM | POA: Diagnosis present

## 2023-01-29 DIAGNOSIS — S3210XA Unspecified fracture of sacrum, initial encounter for closed fracture: Secondary | ICD-10-CM | POA: Diagnosis not present

## 2023-01-29 DIAGNOSIS — Z7984 Long term (current) use of oral hypoglycemic drugs: Secondary | ICD-10-CM

## 2023-01-29 DIAGNOSIS — T794XXA Traumatic shock, initial encounter: Secondary | ICD-10-CM | POA: Diagnosis not present

## 2023-01-29 DIAGNOSIS — S0990XA Unspecified injury of head, initial encounter: Secondary | ICD-10-CM | POA: Diagnosis not present

## 2023-01-29 DIAGNOSIS — R0902 Hypoxemia: Secondary | ICD-10-CM | POA: Diagnosis present

## 2023-01-29 DIAGNOSIS — Z7983 Long term (current) use of bisphosphonates: Secondary | ICD-10-CM | POA: Diagnosis not present

## 2023-01-29 DIAGNOSIS — S2249XA Multiple fractures of ribs, unspecified side, initial encounter for closed fracture: Secondary | ICD-10-CM | POA: Diagnosis not present

## 2023-01-29 DIAGNOSIS — R4189 Other symptoms and signs involving cognitive functions and awareness: Secondary | ICD-10-CM | POA: Diagnosis not present

## 2023-01-29 DIAGNOSIS — I959 Hypotension, unspecified: Secondary | ICD-10-CM | POA: Diagnosis present

## 2023-01-29 DIAGNOSIS — S329XXA Fracture of unspecified parts of lumbosacral spine and pelvis, initial encounter for closed fracture: Secondary | ICD-10-CM | POA: Diagnosis present

## 2023-01-29 DIAGNOSIS — S2243XA Multiple fractures of ribs, bilateral, initial encounter for closed fracture: Secondary | ICD-10-CM | POA: Diagnosis not present

## 2023-01-29 DIAGNOSIS — S3289XA Fracture of other parts of pelvis, initial encounter for closed fracture: Secondary | ICD-10-CM | POA: Diagnosis not present

## 2023-01-29 DIAGNOSIS — S32591A Other specified fracture of right pubis, initial encounter for closed fracture: Secondary | ICD-10-CM | POA: Diagnosis not present

## 2023-01-29 DIAGNOSIS — M160 Bilateral primary osteoarthritis of hip: Secondary | ICD-10-CM | POA: Diagnosis not present

## 2023-01-29 DIAGNOSIS — S32511A Fracture of superior rim of right pubis, initial encounter for closed fracture: Secondary | ICD-10-CM | POA: Diagnosis not present

## 2023-01-29 DIAGNOSIS — S271XXA Traumatic hemothorax, initial encounter: Secondary | ICD-10-CM | POA: Diagnosis not present

## 2023-01-29 DIAGNOSIS — I9589 Other hypotension: Secondary | ICD-10-CM | POA: Diagnosis not present

## 2023-01-29 DIAGNOSIS — Y92014 Private driveway to single-family (private) house as the place of occurrence of the external cause: Secondary | ICD-10-CM

## 2023-01-29 DIAGNOSIS — Z79899 Other long term (current) drug therapy: Secondary | ICD-10-CM | POA: Diagnosis not present

## 2023-01-29 DIAGNOSIS — I469 Cardiac arrest, cause unspecified: Secondary | ICD-10-CM | POA: Diagnosis present

## 2023-01-29 DIAGNOSIS — E041 Nontoxic single thyroid nodule: Secondary | ICD-10-CM | POA: Diagnosis not present

## 2023-01-29 DIAGNOSIS — S2242XA Multiple fractures of ribs, left side, initial encounter for closed fracture: Secondary | ICD-10-CM | POA: Diagnosis not present

## 2023-01-29 DIAGNOSIS — S199XXA Unspecified injury of neck, initial encounter: Secondary | ICD-10-CM | POA: Diagnosis not present

## 2023-01-29 LAB — POCT I-STAT EG7
Acid-base deficit: 10 mmol/L — ABNORMAL HIGH (ref 0.0–2.0)
Bicarbonate: 20.6 mmol/L (ref 20.0–28.0)
Calcium, Ion: 0.84 mmol/L — CL (ref 1.15–1.40)
HCT: 27 % — ABNORMAL LOW (ref 36.0–46.0)
Hemoglobin: 9.2 g/dL — ABNORMAL LOW (ref 12.0–15.0)
O2 Saturation: 35 %
Potassium: 3.7 mmol/L (ref 3.5–5.1)
Sodium: 149 mmol/L — ABNORMAL HIGH (ref 135–145)
TCO2: 23 mmol/L (ref 22–32)
pCO2, Ven: 77.2 mmHg (ref 44–60)
pH, Ven: 7.033 — CL (ref 7.25–7.43)
pO2, Ven: 31 mmHg — CL (ref 32–45)

## 2023-01-29 LAB — TRAUMA TEG PANEL
CFF Max Amplitude: 18.7 mm (ref 15–32)
Citrated Kaolin (R): 4.4 min — ABNORMAL LOW (ref 4.6–9.1)
Citrated Rapid TEG (MA): 54 mm (ref 52–70)
Lysis at 30 Minutes: 0.5 % (ref 0.0–2.6)

## 2023-01-29 LAB — I-STAT CHEM 8, ED
BUN: 6 mg/dL — ABNORMAL LOW (ref 8–23)
Calcium, Ion: 0.83 mmol/L — CL (ref 1.15–1.40)
Chloride: 116 mmol/L — ABNORMAL HIGH (ref 98–111)
Creatinine, Ser: 0.7 mg/dL (ref 0.44–1.00)
Glucose, Bld: 88 mg/dL (ref 70–99)
HCT: 22 % — ABNORMAL LOW (ref 36.0–46.0)
Hemoglobin: 7.5 g/dL — ABNORMAL LOW (ref 12.0–15.0)
Potassium: 2.5 mmol/L — CL (ref 3.5–5.1)
Sodium: 146 mmol/L — ABNORMAL HIGH (ref 135–145)
TCO2: 15 mmol/L — ABNORMAL LOW (ref 22–32)

## 2023-01-29 LAB — I-STAT ARTERIAL BLOOD GAS, ED
Acid-base deficit: 7 mmol/L — ABNORMAL HIGH (ref 0.0–2.0)
Bicarbonate: 19.3 mmol/L — ABNORMAL LOW (ref 20.0–28.0)
Calcium, Ion: 1.02 mmol/L — ABNORMAL LOW (ref 1.15–1.40)
HCT: 31 % — ABNORMAL LOW (ref 36.0–46.0)
Hemoglobin: 10.5 g/dL — ABNORMAL LOW (ref 12.0–15.0)
O2 Saturation: 94 %
Potassium: 3.4 mmol/L — ABNORMAL LOW (ref 3.5–5.1)
Sodium: 141 mmol/L (ref 135–145)
TCO2: 20 mmol/L — ABNORMAL LOW (ref 22–32)
pCO2 arterial: 41.3 mmHg (ref 32–48)
pH, Arterial: 7.277 — ABNORMAL LOW (ref 7.35–7.45)
pO2, Arterial: 78 mmHg — ABNORMAL LOW (ref 83–108)

## 2023-01-29 LAB — MASSIVE TRANSFUSION PROTOCOL ORDER (BLOOD BANK NOTIFICATION)

## 2023-01-29 LAB — COMPREHENSIVE METABOLIC PANEL
ALT: 21 U/L (ref 0–44)
AST: 38 U/L (ref 15–41)
Albumin: 2.7 g/dL — ABNORMAL LOW (ref 3.5–5.0)
Alkaline Phosphatase: 37 U/L — ABNORMAL LOW (ref 38–126)
Anion gap: 13 (ref 5–15)
BUN: 10 mg/dL (ref 8–23)
CO2: 18 mmol/L — ABNORMAL LOW (ref 22–32)
Calcium: 8.5 mg/dL — ABNORMAL LOW (ref 8.9–10.3)
Chloride: 109 mmol/L (ref 98–111)
Creatinine, Ser: 1.11 mg/dL — ABNORMAL HIGH (ref 0.44–1.00)
GFR, Estimated: 48 mL/min — ABNORMAL LOW (ref >60)
Glucose, Bld: 158 mg/dL — ABNORMAL HIGH (ref 70–99)
Potassium: 3.5 mmol/L (ref 3.5–5.1)
Sodium: 140 mmol/L (ref 135–145)
Total Bilirubin: 1 mg/dL (ref <1.2)
Total Protein: 5.3 g/dL — ABNORMAL LOW (ref 6.5–8.1)

## 2023-01-29 LAB — CBC
HCT: 41.4 % (ref 36.0–46.0)
Hemoglobin: 13.2 g/dL (ref 12.0–15.0)
MCH: 28.4 pg (ref 26.0–34.0)
MCHC: 31.9 g/dL (ref 30.0–36.0)
MCV: 89.2 fL (ref 80.0–100.0)
Platelets: 108 10*3/uL — ABNORMAL LOW (ref 150–400)
RBC: 4.64 MIL/uL (ref 3.87–5.11)
RDW: 14.6 % (ref 11.5–15.5)
WBC: 7.9 10*3/uL (ref 4.0–10.5)
nRBC: 0 % (ref 0.0–0.2)

## 2023-01-29 LAB — ETHANOL: Alcohol, Ethyl (B): <10 mg/dL (ref <10)

## 2023-01-29 LAB — PROTIME-INR
INR: 3.3 — ABNORMAL HIGH (ref 0.8–1.2)
Prothrombin Time: 33.4 s — ABNORMAL HIGH (ref 11.4–15.2)

## 2023-01-29 LAB — PREPARE RBC (CROSSMATCH)

## 2023-01-29 LAB — I-STAT CG4 LACTIC ACID, ED: Lactic Acid, Venous: 2.6 mmol/L (ref 0.5–1.9)

## 2023-01-29 LAB — APTT: aPTT: 76 s — ABNORMAL HIGH (ref 24–36)

## 2023-01-29 LAB — ABO/RH: ABO/RH(D): A POS

## 2023-01-29 MED ORDER — PROTHROMBIN COMPLEX CONC HUMAN 500 UNITS IV KIT
1400.0000 [IU] | INTRAVENOUS | Status: DC
Start: 2023-01-29 — End: 2023-01-29

## 2023-01-29 MED ORDER — KETAMINE HCL 50 MG/5ML IJ SOSY
PREFILLED_SYRINGE | INTRAMUSCULAR | Status: AC
Start: 2023-01-29 — End: ?
  Filled 2023-01-29: qty 10

## 2023-01-29 MED ORDER — CALCIUM GLUCONATE-NACL 2-0.675 GM/100ML-% IV SOLN: 2.0000 g | Freq: Once | INTRAVENOUS | Status: DC

## 2023-01-29 MED ORDER — DOCUSATE SODIUM 100 MG PO CAPS: 100.0000 mg | ORAL_CAPSULE | Freq: Two times a day (BID) | ORAL | Status: DC

## 2023-01-29 MED ORDER — PROTHROMBIN COMPLEX CONC HUMAN 500 UNITS IV KIT
2042.0000 [IU] | PACK | Status: DC
  Filled 2023-01-29: qty 2042

## 2023-01-29 MED ORDER — TETANUS-DIPHTH-ACELL PERTUSSIS 5-2.5-18.5 LF-MCG/0.5 IM SUSY
0.5000 mL | PREFILLED_SYRINGE | Freq: Once | INTRAMUSCULAR | Status: AC
  Administered 2023-01-29: 0.5 mL via INTRAMUSCULAR

## 2023-01-29 MED ORDER — SODIUM CHLORIDE 0.9% IV SOLUTION: Freq: Once | INTRAVENOUS | Status: DC

## 2023-01-29 MED ORDER — MORPHINE SULFATE (PF) 2 MG/ML IV SOLN: 1.0000 mg | INTRAVENOUS | Status: DC | PRN

## 2023-01-29 MED ORDER — SODIUM CHLORIDE 0.9 % IV SOLN: INTRAVENOUS | Status: DC | PRN

## 2023-01-29 MED ORDER — FENTANYL CITRATE PF 50 MCG/ML IJ SOSY
PREFILLED_SYRINGE | INTRAMUSCULAR | Status: DC | PRN
  Administered 2023-01-29: 12.5 ug via INTRAVENOUS

## 2023-01-29 MED ORDER — POLYETHYLENE GLYCOL 3350 17 G PO PACK: 17.0000 g | PACK | Freq: Every day | ORAL | Status: DC | PRN

## 2023-01-29 MED ORDER — EPINEPHRINE 1 MG/10ML IJ SOSY
PREFILLED_SYRINGE | INTRAMUSCULAR | Status: DC | PRN
  Administered 2023-01-29: 1 mg via INTRAVENOUS

## 2023-01-29 MED ORDER — FENTANYL CITRATE PF 50 MCG/ML IJ SOSY
12.5000 ug | PREFILLED_SYRINGE | Freq: Once | INTRAMUSCULAR | Status: AC
  Administered 2023-01-29: 12.5 ug via INTRAVENOUS

## 2023-01-29 MED ORDER — METHOCARBAMOL 1000 MG/10ML IJ SOLN: 500.0000 mg | Freq: Three times a day (TID) | INTRAMUSCULAR | Status: DC

## 2023-01-29 MED ORDER — AMIODARONE HCL IN DEXTROSE 360-4.14 MG/200ML-% IV SOLN: 30.0000 mg/h | INTRAVENOUS | Status: DC

## 2023-01-29 MED ORDER — OXYCODONE HCL 5 MG PO TABS: 2.5000 mg | ORAL_TABLET | ORAL | Status: DC | PRN

## 2023-01-29 MED ORDER — METOPROLOL TARTRATE 5 MG/5ML IV SOLN: 5.0000 mg | Freq: Four times a day (QID) | INTRAVENOUS | Status: DC | PRN

## 2023-01-29 MED ORDER — AMIODARONE LOAD VIA INFUSION
150.0000 mg | Freq: Once | INTRAVENOUS | Status: AC
  Administered 2023-01-29: 150 mg via INTRAVENOUS
  Filled 2023-01-29: qty 83.34

## 2023-01-29 MED ORDER — ACETAMINOPHEN 500 MG PO TABS: 1000.0000 mg | ORAL_TABLET | Freq: Four times a day (QID) | ORAL | Status: DC

## 2023-01-29 MED ORDER — NOREPINEPHRINE 4 MG/250ML-% IV SOLN: 2.0000 ug/min | INTRAVENOUS | Status: DC

## 2023-01-29 MED ORDER — AMIODARONE HCL IN DEXTROSE 360-4.14 MG/200ML-% IV SOLN: 60.0000 mg/h | INTRAVENOUS | Status: DC

## 2023-01-29 MED ORDER — IOHEXOL 350 MG/ML SOLN
75.0000 mL | Freq: Once | INTRAVENOUS | Status: AC | PRN
  Administered 2023-01-29: 75 mL via INTRAVENOUS

## 2023-01-29 MED ORDER — HYDRALAZINE HCL 20 MG/ML IJ SOLN: 10.0000 mg | INTRAMUSCULAR | Status: DC | PRN

## 2023-01-29 MED ORDER — AMIODARONE HCL IN DEXTROSE 360-4.14 MG/200ML-% IV SOLN
INTRAVENOUS | Status: AC
  Filled 2023-01-29: qty 200

## 2023-01-29 MED ORDER — METHOCARBAMOL 500 MG PO TABS: 500.0000 mg | ORAL_TABLET | Freq: Three times a day (TID) | ORAL | Status: DC

## 2023-01-29 MED ORDER — LACTATED RINGERS IV SOLN: INTRAVENOUS | Status: DC

## 2023-01-29 MED ORDER — CALCIUM GLUCONATE-NACL 1-0.675 GM/50ML-% IV SOLN: 1.0000 g | INTRAVENOUS | Status: AC

## 2023-01-29 MED ORDER — CALCIUM GLUCONATE-NACL 2-0.675 GM/100ML-% IV SOLN
2.0000 g | Freq: Once | INTRAVENOUS | Status: AC
  Administered 2023-01-29: 2000 mg via INTRAVENOUS
  Filled 2023-01-29: qty 100

## 2023-01-29 MED ORDER — ONDANSETRON HCL 4 MG/2ML IJ SOLN: 4.0000 mg | Freq: Four times a day (QID) | INTRAMUSCULAR | Status: DC | PRN

## 2023-01-29 MED ORDER — NOREPINEPHRINE 4 MG/250ML-% IV SOLN
INTRAVENOUS | Status: AC
  Administered 2023-01-29: 4 ug/min via INTRAVENOUS
  Filled 2023-01-29: qty 250

## 2023-01-29 MED ORDER — SODIUM CHLORIDE 0.9 % IV SOLN: 250.0000 mL | INTRAVENOUS | Status: DC

## 2023-01-29 MED ORDER — SODIUM CHLORIDE 0.9 % IV SOLN
INTRAVENOUS | Status: AC | PRN
  Administered 2023-01-29: 1000 mL via INTRAVENOUS

## 2023-01-29 MED ORDER — FENTANYL CITRATE PF 50 MCG/ML IJ SOSY
PREFILLED_SYRINGE | INTRAMUSCULAR | Status: AC
  Filled 2023-01-29: qty 1

## 2023-01-29 MED ORDER — ONDANSETRON 4 MG PO TBDP: 4.0000 mg | ORAL_TABLET | Freq: Four times a day (QID) | ORAL | Status: DC | PRN

## 2023-01-30 ENCOUNTER — Encounter: Payer: Self-pay | Admitting: Physician Assistant

## 2023-01-30 LAB — PREPARE CRYOPRECIPITATE
Unit division: 0
Unit division: 0
Unit division: 0

## 2023-01-30 LAB — PREPARE PLATELET PHERESIS
Unit division: 0
Unit division: 0

## 2023-01-30 LAB — BPAM CRYOPRECIPITATE
Blood Product Expiration Date: 202412142359
Blood Product Expiration Date: 202412142359
Blood Product Expiration Date: 202412142359
ISSUE DATE / TIME: 202412091910
ISSUE DATE / TIME: 202412091931
ISSUE DATE / TIME: 202412091833
Unit Type and Rh: 5100
Unit Type and Rh: 6200
Unit Type and Rh: 5100

## 2023-01-30 LAB — BPAM PLATELET PHERESIS
Blood Product Expiration Date: 202412102359
Blood Product Expiration Date: 202412112359
ISSUE DATE / TIME: 202412091833
ISSUE DATE / TIME: 202412091857
Unit Type and Rh: 6200
Unit Type and Rh: 9500

## 2023-01-31 LAB — TYPE AND SCREEN
ABO/RH(D): A POS
Antibody Screen: NEGATIVE
Unit division: 0
Unit division: 0
Unit division: 0
Unit division: 0
Unit division: 0
Unit division: 0
Unit division: 0
Unit division: 0
Unit division: 0
Unit division: 0
Unit division: 0
Unit division: 0
Unit division: 0
Unit division: 0
Unit division: 0
Unit division: 0
Unit division: 0
Unit division: 0
Unit division: 0
Unit division: 0
Unit division: 0
Unit division: 0
Unit division: 0
Unit division: 0
Unit division: 0
Unit division: 0
Unit division: 0
Unit division: 0
Unit division: 0
Unit division: 0
Unit division: 0
Unit division: 0
Unit division: 0
Unit division: 0
Unit division: 0
Unit division: 0
Unit division: 0
Unit division: 0
Unit division: 0
Unit division: 0

## 2023-01-31 LAB — PREPARE FRESH FROZEN PLASMA
Unit division: 0
Unit division: 0
Unit division: 0
Unit division: 0
Unit division: 0
Unit division: 0
Unit division: 0
Unit division: 0
Unit division: 0
Unit division: 0
Unit division: 0
Unit division: 0
Unit division: 0

## 2023-01-31 LAB — BPAM RBC
Blood Product Expiration Date: 202412312359
Blood Product Expiration Date: 202412252359
Blood Product Expiration Date: 202412272359
Blood Product Expiration Date: 202412272359
Blood Product Expiration Date: 202412272359
Blood Product Expiration Date: 202412272359
Blood Product Expiration Date: 202412272359
Blood Product Expiration Date: 202412282359
Blood Product Expiration Date: 202412282359
Blood Product Expiration Date: 202412282359
Blood Product Expiration Date: 202412282359
Blood Product Expiration Date: 202412282359
Blood Product Expiration Date: 202412302359
Blood Product Expiration Date: 202412302359
Blood Product Expiration Date: 202412302359
Blood Product Expiration Date: 202412302359
Blood Product Expiration Date: 202412312359
Blood Product Expiration Date: 202412312359
Blood Product Expiration Date: 202412312359
Blood Product Expiration Date: 202412312359
Blood Product Expiration Date: 202501012359
Blood Product Expiration Date: 202501012359
Blood Product Expiration Date: 202501012359
Blood Product Expiration Date: 202501012359
Blood Product Expiration Date: 202501022359
Blood Product Expiration Date: 202501032359
Blood Product Expiration Date: 202501132359
Blood Product Unit Number: 202412302359
Blood Product Unit Number: 202412312359
Blood Product Unit Number: 202412312359
Blood Product Unit Number: 202412312359
Blood Product Unit Number: 202412312359
Blood Product Unit Number: 202412312359
Blood Product Unit Number: 202412312359
Blood Product Unit Number: 202501132359
ISSUE DATE / TIME: 202412091639
ISSUE DATE / TIME: 202412091646
ISSUE DATE / TIME: 202412091826
ISSUE DATE / TIME: 202412091826
ISSUE DATE / TIME: 202412091833
ISSUE DATE / TIME: 202412091833
ISSUE DATE / TIME: 202412091833
ISSUE DATE / TIME: 202412091833
ISSUE DATE / TIME: 202412091833
ISSUE DATE / TIME: 202412091833
ISSUE DATE / TIME: 202412091833
ISSUE DATE / TIME: 202412091833
ISSUE DATE / TIME: 202412091908
ISSUE DATE / TIME: 202412091908
ISSUE DATE / TIME: 202412091908
ISSUE DATE / TIME: 202412091908
ISSUE DATE / TIME: 202412091908
ISSUE DATE / TIME: 202412091921
ISSUE DATE / TIME: 202412091921
ISSUE DATE / TIME: 202412091921
ISSUE DATE / TIME: 202412091921
ISSUE DATE / TIME: 202412091926
ISSUE DATE / TIME: 202412091926
ISSUE DATE / TIME: 202412091926
ISSUE DATE / TIME: 202412091926
ISSUE DATE / TIME: 202412091942
ISSUE DATE / TIME: 202412100745
ISSUE DATE / TIME: 202412101426
ISSUE DATE / TIME: 202412312359
PRODUCT CODE: 202412091933
PRODUCT CODE: 202412091933
PRODUCT CODE: 202412092000
PRODUCT CODE: 202412092000
PRODUCT CODE: 202412092000
PRODUCT CODE: 202412092000
PRODUCT CODE: 202412302359
PRODUCT CODE: 202412312359
PRODUCT CODE: 202412312359
PRODUCT CODE: 202412312359
PRODUCT CODE: 202412312359
PRODUCT CODE: 202412312359
PRODUCT CODE: 202501132359
Unit Type and Rh: 6200
Unit Type and Rh: 202412091933
Unit Type and Rh: 202412091933
Unit Type and Rh: 202412091942
Unit Type and Rh: 202412092000
Unit Type and Rh: 202412092000
Unit Type and Rh: 202412092000
Unit Type and Rh: 202412092000
Unit Type and Rh: 202412101426
Unit Type and Rh: 202412312359
Unit Type and Rh: 5100
Unit Type and Rh: 5100
Unit Type and Rh: 5100
Unit Type and Rh: 5100
Unit Type and Rh: 5100
Unit Type and Rh: 5100
Unit Type and Rh: 5100
Unit Type and Rh: 5100
Unit Type and Rh: 5100
Unit Type and Rh: 5100
Unit Type and Rh: 5100
Unit Type and Rh: 5100
Unit Type and Rh: 5100
Unit Type and Rh: 5100
Unit Type and Rh: 5100
Unit Type and Rh: 5100
Unit Type and Rh: 5100
Unit Type and Rh: 5100
Unit Type and Rh: 5100
Unit Type and Rh: 6200
Unit Type and Rh: 6200
Unit Type and Rh: 6200
Unit Type and Rh: 6200
Unit Type and Rh: 6200
Unit Type and Rh: 6200
Unit Type and Rh: 6200
Unit Type and Rh: 6200
Unit Type and Rh: 6200
Unit Type and Rh: 6200
Unit Type and Rh: 6200
Unit Type and Rh: 6200
Unit Type and Rh: 6200
Unit Type and Rh: 6200
Unit Type and Rh: 6200

## 2023-01-31 LAB — BPAM FFP
Blood Product Expiration Date: 202412142359
Blood Product Expiration Date: 202412092359
Blood Product Expiration Date: 202412112359
Blood Product Expiration Date: 202412112359
Blood Product Expiration Date: 202412132359
Blood Product Expiration Date: 202412142359
Blood Product Expiration Date: 202412142359
Blood Product Expiration Date: 202412142359
Blood Product Expiration Date: 202412142359
Blood Product Expiration Date: 202412142359
Blood Product Expiration Date: 202412152359
Blood Product Expiration Date: 202412152359
Blood Product Expiration Date: 202412212359
Blood Product Expiration Date: 202412212359
Blood Product Expiration Date: 202412232359
Blood Product Expiration Date: 202412232359
Blood Product Expiration Date: 202412232359
Blood Product Expiration Date: 202412262359
Blood Product Unit Number: 202412142359
Blood Product Unit Number: 202412142359
Blood Product Unit Number: 202412142359
Blood Product Unit Number: 202412142359
Blood Product Unit Number: 202412142359
Blood Product Unit Number: 202412142359
ISSUE DATE / TIME: 202412091757
ISSUE DATE / TIME: 202412091757
ISSUE DATE / TIME: 202412091833
ISSUE DATE / TIME: 202412091833
ISSUE DATE / TIME: 202412091833
ISSUE DATE / TIME: 202412091833
ISSUE DATE / TIME: 202412091833
ISSUE DATE / TIME: 202412091833
ISSUE DATE / TIME: 202412091833
ISSUE DATE / TIME: 202412091833
ISSUE DATE / TIME: 202412091907
ISSUE DATE / TIME: 202412091907
ISSUE DATE / TIME: 202412091907
ISSUE DATE / TIME: 202412091907
ISSUE DATE / TIME: 202412091907
ISSUE DATE / TIME: 202412091932
ISSUE DATE / TIME: 202412091932
ISSUE DATE / TIME: 202412091943
ISSUE DATE / TIME: 202412091943
ISSUE DATE / TIME: 202412101504
ISSUE DATE / TIME: 202412101504
ISSUE DATE / TIME: 202412142359
ISSUE DATE / TIME: 202412142359
ISSUE DATE / TIME: 202412142359
PRODUCT CODE: 202412092015
PRODUCT CODE: 202412092015
PRODUCT CODE: 202412092015
PRODUCT CODE: 202412092015
PRODUCT CODE: 202412142359
PRODUCT CODE: 202412142359
PRODUCT CODE: 202412142359
PRODUCT CODE: 202412142359
PRODUCT CODE: 202412142359
Unit Type and Rh: 6200
Unit Type and Rh: 202412091943
Unit Type and Rh: 202412091943
Unit Type and Rh: 202412092015
Unit Type and Rh: 202412092015
Unit Type and Rh: 202412092015
Unit Type and Rh: 202412092015
Unit Type and Rh: 202412142359
Unit Type and Rh: 202412142359
Unit Type and Rh: 202412142359
Unit Type and Rh: 6200
Unit Type and Rh: 6200
Unit Type and Rh: 6200
Unit Type and Rh: 6200
Unit Type and Rh: 6200
Unit Type and Rh: 6200
Unit Type and Rh: 6200
Unit Type and Rh: 6200
Unit Type and Rh: 6200
Unit Type and Rh: 6200
Unit Type and Rh: 6200
Unit Type and Rh: 6200
Unit Type and Rh: 6200
Unit Type and Rh: 6200
Unit Type and Rh: 6200
Unit Type and Rh: 6200
Unit Type and Rh: 6200
Unit Type and Rh: 6200
Unit Type and Rh: 6200
Unit Type and Rh: 6200
Unit Type and Rh: 6200
Unit Type and Rh: 6200
Unit Type and Rh: 6200
Unit Type and Rh: 6200
Unit Type and Rh: 6200

## 2023-02-02 ENCOUNTER — Ambulatory Visit: Payer: No Typology Code available for payment source | Admitting: Cardiology

## 2023-02-05 ENCOUNTER — Ambulatory Visit: Payer: No Typology Code available for payment source | Admitting: Urology

## 2023-02-21 NOTE — ED Notes (Signed)
Dr. Bedelia Person Placing R Femoral Central line

## 2023-02-21 NOTE — Progress Notes (Signed)
   28-Feb-2023 2000  Spiritual Encounters  Type of Visit Follow up  Care provided to: Pt not available  Referral source Code page  Reason for visit Code  OnCall Visit No   Chaplain responded to a code blue on unit. The patient was attended to by the medical team.  Chaplain did not know that this patient was the same one I had left earlier in the ED.  Code call was cancelled as I arrived.   Deatra Canter [facility]  [phone]

## 2023-02-21 NOTE — Procedures (Signed)
   Procedure Note  Date: 02/28/2023  Procedure: central venous catheter placement--right, femoral vein, with ultrasound guidance  Pre-op diagnosis: hypotension, need for administration of vasopressors Post-op diagnosis: same  Surgeon: Diamantina Monks, MD  Anesthesia: local  EBL: <5cc Drains/Implants: triple lumen central venous catheter  Description of procedure: Time-out was performed verifying correct patient, procedure, site, laterality, and signature of informed consent. The right groin was prepped and draped in the usual sterile fashion. The right femoral vein was localized with ultrasound guidance. Five ccs of local anesthetic was infiltrated at the site of venous access. The right femoral vein was accessed using an introducer needle and a guidewire passed through the needle. The needle was removed and a skin nick was made. The tract was dilated and the central venous catheter advanced over the guidewire followed by removal of the guidewire. All ports drew blood easily and all were flushed with saline. The catheter was secured to the skin with suture and a sterile dressing. The patient tolerated the procedure well. There were no immediate complications.   Diamantina Monks, MD General and Trauma Surgery [facility] Surgery
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
## 2023-02-21 NOTE — ED Notes (Signed)
ROSC obtained 1904

## 2023-02-21 NOTE — Procedures (Signed)
FAST Exam  Pre-procedure diagnosis: hypotension Post-procedure diagnosis: same  Procedure: FAST  Surgeon: Kris Mouton, MD  Description of procedure: The patient's abdomen was imaged in four regions with the ultrasound. First, the right upper quadrant was imaged. No free fluid was seen between the right kidney and the liver in Morison's pouch. Next, the epigastrium was imaged. No significant pericardial effusion was seen. Next, the left upper quadrant was imaged. No free fluid was seen between the left kidney and the spleen. Finally, the bladder was imaged. No free fluid was seen next to the bladder in the pelvis.  Impression: Negative  Diamantina Monks, MD General and Trauma Surgery [facility]

## 2023-02-21 NOTE — ED Notes (Signed)
Ortho consulted, Dr. Missy Sabins
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
## 2023-02-21 NOTE — ED Notes (Signed)
PT moved to 4L O2 NRB
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
## 2023-02-21 NOTE — ED Notes (Signed)
C-spine cleared, c-collar removed, husband at bedside.

## 2023-02-21 NOTE — Progress Notes (Signed)
Recurrent hypotension. Levo and MTP started. Additional 6u pRBC and 8u FFP, 2 plt, 1 cryo given. CVC placed. AFRVR, amio bolus given. Add'l 2g calcium given for total of 4g. FAST repeated, remains negative. Husband at bedside, updated.   Critical care time:  Diamantina Monks, MD General and Trauma Surgery [facility] Surgery
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
## 2023-02-21 NOTE — Progress Notes (Addendum)
Decline in mentation, failure to protect airway. Intubation attempted x3 before successful. Anesthesia present at bedside. Copious bloody drainage from airway and gastric effluent refluxing. Despite continued MTP, degeneration of rhythm and subsequent cardiac arrest. Multiple rounds of ACLS with ROSC. Husband updated again and brought back to see the patient. Desired to wait until son arrived. Son arrived and he was also updated. Transported to ICU with continuation of supportive care. Cardiac arrest shortly after arrival to ICU. Family requested cessation of resuscitative efforts. Time of death declared at 67. Total products admin 28 pRBC, 17 FFP, 1plt, 3 cryo.   Additional critical care time:  Diamantina Monks, MD General and Trauma Surgery [facility] Surgery

## 2023-02-21 NOTE — ED Notes (Signed)
1904- ROSC 1904-Succinylcholine 100mg  1905-100mg  ketamine 1905- platelets 1907 compressions 1909- pulse check. No pulse 1907- epi 1910 epi 1911- pulse check no pulse 1912- EPI 1913- ROSC 1914 100MG  rocuronium 1915- compressions 1915-epi 1917- pulse check. No pulse 1917- intubated 1919- pulse check. No pulse 1917- resumed compressions 1917- epi 1919-epi 1920- resumes compressions 1920- ROSC 1922- resumed compressions 1924- ROSC 1924- epi 1924- resumed compressions 1925-bicarb 1926- pulse check.no pulse 1928- ROSC 1926-calcium 1926- epi 1930-epi drip 65mcg/hr 1930- calcium 1934- bicarb 1936 bicarb 1934  calcium 1937-VTACH shock at 120j 1938- bicarb 1941-calcium 1941- resumed compressions 1941- EPI 1943- ROSC 1944- vtach 1944- levo 1945- bicarb 1946-calcium 1950- bicarb 1955- Pt transported to ICU
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
## 2023-02-21 NOTE — ED Notes (Signed)
Returned to Tra B after CT.

## 2023-02-21 NOTE — ED Notes (Signed)
C-spine cleared by Dr Bedelia Person
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
## 2023-02-21 NOTE — ED Notes (Signed)
Pt started showing a change in orientation, TRN that remained at bedside called Dr. Bedelia Person, EDP was called to bedside. Cardiac Monitor noted she was in A-Fib RVR. 2nd FFP was paused during this change in her status until [place] on the phone with TRN gave verbal order to continue & finish the FFP that was going initially.

## 2023-02-21 NOTE — ED Provider Notes (Signed)
Procedure Name: Intubation Date/Time: 02/16/2023 7:41 PM  Performed by: Rondel Baton, MDOxygen Delivery Method: Ambu bag Preoxygenation: Pre-oxygenation with 100% oxygen Ventilation: Mask ventilation with difficulty Laryngoscope Size: Mac and 4 Grade View: Grade IV Tube type: Subglottic suction tube Tube size: 7.0 mm Number of attempts: 3 Airway Equipment and Method: Bougie stylet, Rigid stylet and Video-laryngoscopy Placement Confirmation: CO2 detector Secured at: 23 cm Tube secured with: ETT holder Dental Injury: Bloody posterior oropharynx  Difficulty Due To: Difficulty was anticipated Comments: Assisted with code that was in progress.  Initial intubation by Dr. Bedelia Person.  Please see her procedure note for additional details.  Upon my first attempt there is significant blood coming out of the oropharynx.  Attempted suctioning and DL but unfortunately were not able to pass through the cords.  Dual suction was set up and patient was copiously suctioned while intubating with direct laryngoscopy with a bougie.  ET tube was able to be successfully passed through the cords.  Anesthesia came down to the bedside and verified placement.        Rondel Baton, MD 02/16/2023 [phone]

## 2023-02-21 NOTE — ED Notes (Signed)
PT changed to 5L NRB
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
## 2023-02-21 NOTE — ED Notes (Signed)
Pt had 1 emesis event, was sat up & mouth suctioned. Pt was able to nod when asked "how are feeling?" By this RN. Airway was patent & pt was noted to be swallowing post emesis.

## 2023-02-21 NOTE — Progress Notes (Addendum)
Trauma Response Nurse Documentation   Anne Wolf is a 89 y.o. female arriving to [facility] ED via EMS  On Eliquis (apixaban) daily. Trauma was activated as a Level 1 by ED Charge RN based on the following trauma criteria Anytime Systolic Blood Pressure < 90.  Patient cleared for CT by Dr. Bedelia Person. Pt transported to CT with trauma response nurse present to monitor. RN remained with the patient throughout their absence from the department for clinical observation.   GCS 13.  History   Past Medical History:  Diagnosis Date   Aortic stenosis    mild; mild MR; slightly increased pulmonary artery pressure with mild RVH; normal LV-2010   Arthritis    Atrial fibrillation (HCC)    chronic anticoagulation; adequate HR control on minimal AV nodal blocking medication    Cancer (HCC)    skin cancers removed from face   CHF (congestive heart failure) (HCC)    Diabetes mellitus without complication (HCC)    2   Dyspnea    Dysrhythmia    Fasting hyperglycemia    + microalbuminuria; A1c of 6.5% in 11/2010   Hematochezia 01/2009   01/2009-presumed ischemic colitis; history of diverticulosis   History of kidney stones    Hx of adenomatous colonic polyps 2005   adenomatous polyp; negative colonoscopy in 2009   Hyperlipidemia    Seizure (HCC) 01/2022   Stroke [facility])      Past Surgical History:  Procedure Laterality Date   CATARACT EXTRACTION W/PHACO Right 03/23/2014   Procedure: CATARACT EXTRACTION PHACO AND INTRAOCULAR LENS PLACEMENT (IOC);  Surgeon: Susa Simmonds, MD;  Location: [facility];  Service: Ophthalmology;  Laterality: Right;  CDE:2.83   CATARACT EXTRACTION W/PHACO Left 07/27/2014   Procedure: CATARACT EXTRACTION PHACO AND INTRAOCULAR LENS PLACEMENT LEFT EYE CDE=6.97;  Surgeon: Susa Simmonds, MD;  Location: [facility];  Service: Ophthalmology;  Laterality: Left;   COLONOSCOPY  2009   negative; prior study with adenomatous polyp   CYSTOSCOPY WITH RETROGRADE PYELOGRAM, URETEROSCOPY AND  STENT PLACEMENT Left 04/27/2022   Procedure: CYSTOSCOPY WITH RETROGRADE PYELOGRAM, URETEROSCOPY AND STENT PLACEMENT;  Surgeon: Malen Gauze, MD;  Location: [facility];  Service: Urology;  Laterality: Left;   CYSTOSCOPY/URETEROSCOPY/HOLMIUM LASER/STENT PLACEMENT Right 08/21/2016   Procedure: CYSTOSCOPY/URETEROSCOPY/RETROGRADE PYELOGRAM//STENT PLACEMENT;  Surgeon: Hildred Laser, MD;  Location: [facility] ORS;  Service: Urology;  Laterality: Right;   EYE SURGERY     HOLMIUM LASER APPLICATION Left 04/27/2022   Procedure: HOLMIUM LASER APPLICATION;  Surgeon: Malen Gauze, MD;  Location: [facility];  Service: Urology;  Laterality: Left;   INTRAOPERATIVE TRANSTHORACIC ECHOCARDIOGRAM N/A 05/31/2021   Procedure: INTRAOPERATIVE TRANSTHORACIC ECHOCARDIOGRAM;  Surgeon: Tonny Bollman, MD;  Location: [facility] OR;  Service: Open Heart Surgery;  Laterality: N/A;   RIGHT/LEFT HEART CATH AND CORONARY ANGIOGRAPHY N/A 02/04/2021   Procedure: RIGHT/LEFT HEART CATH AND CORONARY ANGIOGRAPHY;  Surgeon: Tonny Bollman, MD;  Location: [facility] INVASIVE CV LAB;  Service: Cardiovascular;  Laterality: N/A;   TRANSCATHETER AORTIC VALVE REPLACEMENT, TRANSFEMORAL N/A 05/31/2021   Procedure: Transcatheter Aortic Valve Replacement 23mm, Transfemoral;  Surgeon: Tonny Bollman, MD;  Location: [facility] OR;  Service: Open Heart Surgery;  Laterality: N/A;  Percutaneous     Initial Focused Assessment (If applicable, or please see trauma documentation): -Airway: intact, patent on arrival to ED. -Breathing: Requiring 6L O2 via [place] per EMS and also maintained on this on arrival to ED. Breath sounds equal, clear. -Circulation: No uncontrolled signs of external hemorrhage.  *Abrasion to R hip, hematoma  to L arm and abrasion to R ankle. Hypotension on arrival to trauma bay.  *Manual BP: 86/60 *18G PIV to R AC *20G PIV to L FA -Disability: Pt opens eyes to voice and has mild confusion, PERRLA C-collar in place  CT's Completed:   CT Head, CT C-Spine, CT  Chest w/ contrast, and CT abdomen/pelvis w/ contrast   Interventions/Event summary: -1619: L1 activated -1632: Pt arrived to trauma bay -1634: Manual BP: 86/60 -1635: Dr Bedelia Person at bedside -1636: Warmed NS bolus given -1639: 2U Emergency release PRBCs given in TB prior to CT -1643: Orders placed for Trauma ED order set -1647: Portable CXR and Pelvic XR, FAST performed and negative -1648: Trauma labs drawn including chem 8 istat and trauma TEG panel.  -1659: 12.32mcg fentanyl given while in CT.  Pt responded to this for pain and remained normotensive.  -1702: CT pan scan once normotension obtained. -1705: O2 titrated down to 4L [place] -1711: Dr Duwayne Heck, ortho surgeon consulted. -1715: ICU orders placed -1719: Tdap administered  -1720: Pt's husband at bedside. TRN spoke with him and comforted him.  TRN then spoke with pt's son, Arlys John on the phone to update him on his mother's status. -1721: ABG done.  -1750: C-spine cleared by Dr Bedelia Person -1751: BP dropped to 75/42 after remaining normotensive for a little while. -1753: BP 68/49, cuff was repositioned -1755: BP 67/46 HR dropped into the 50's-60's momentarily  -1800: BP 62/41 NS bolus initiated and EDP called to bedside.  Dr Bedelia Person was also called at this time.  Orders to administer 2U FFP emergency release. TEG panel starting to trend indicating decreased R time.  -1804: 100% NRB placed on pt due to O2 sat being 89%.  -1807: Pt converted into A-fib w/ RVR w/ HR in the 110's-120's. Automatic BP 159/90. -1810: Auto BP 99/79 -1816: Auto BP 153/97 - TRN recommended taking manual BP at this time due to inconsistency in BP and pt's presentation.  -1818: Manual BP attempted by NT.  Unable to be heard.  -1820: Manual BP by TRN 68/40. -1821: Levophed initiated  -1824: Cuff switched to different side auto BP now 66/41. -1825: 3rd PIV obtained.  -1827: BP 58/36 2U PRBC given -1828: Pt vomited. But now slightly more responsive.  Airway patent at  this time.  -1829: MTP activated (see blood admin for details) -1836: 2g calcium gluconate given, new set of labs ordered and obtained. -1840: R femoral CVC cordis triple lumen placed by Dr Bedelia Person at bedside. -1842: Amiodarone bolus and infusion started -1846: Dr Aundria Rud at bedside -1850: 2nd FAST performed and still negative, Kcentra given as reversal at this time.  -1900: Pt asystole, chest compressions initiated and ACLS performed.  See code documentation under primary RN's note @ 2007. After initial ROSC, Attempt of intubation by EDP and Dr Bedelia Person without success due to excessive amounts of blood and emesis in the airway. X2 suction devices were attempted for better visualization without much success.  -1910: Anesthesia was called - they immediately came to bedside and intubated pt after much difficulty.  OG tube was placed alongside the ETT and connected to LWS.  25 min total of CPR initiated over a 67 minute period. 49 Units of blood product given via [place].  2 synchronized shocks administered via [place] for vtach.  Epi gtt initiated.  -1943: Son arrived and ROSC achieved in trauma bay.  -1955: Pt transported to ICU.  - 2005: Upon arrival to ICU, pt lost pulses again and CPR  was initiated.  At this time, Dr Bedelia Person brought family in and resuscitative efforts were discontinued.  Pt's Husband, Reita Cliche, son, Arlys John, and DIL at bedside.  Pt was then placed on comfort care and heart heart stopped beating @ 2050.  -2050: Time of cardiac death.    Pts family states that [employer] [place] [facility] will be used and they took all of her belongings and left the bedside. Pt does have another son that lives in [place] that was unable to be here.  Consults completed:  Orthopaedic Surgeon at 1711. Dr Aundria Rud  MTP Summary (If applicable):  PRBCs: 28 FFP: 17 PLT: 1 Cryo: 3  Bedside handoff with ED RN Penny Pia.  ICU handoff with Vilinda Boehringer, RN and ME handoff with Sabas Sous.   Janora Norlander  Trauma Response  RN  Please call TRN at [phone] for further assistance.
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
## 2023-02-21 DEATH — deceased

## 2023-03-06 MED FILL — Medication: Qty: 1 | Status: AC

## 2023-03-22 ENCOUNTER — Telehealth: Payer: Self-pay | Admitting: Neurology

## 2023-03-22 NOTE — Telephone Encounter (Signed)
Pt's husband called to notify patient has  passed away.  Offered husband [facility] condolences

## 2023-04-03 ENCOUNTER — Ambulatory Visit: Payer: No Typology Code available for payment source | Admitting: Neurology

## 2023-07-12 ENCOUNTER — Ambulatory Visit: Payer: No Typology Code available for payment source | Admitting: Neurology
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
## 2024-03-09 IMAGING — DX DG CHEST 2V
2 series · 2 of 2 positions shown · non-contrast
Comparison: 08/13/2008, CT 02/15/2021

CLINICAL DATA: 87-year-old female with aortic stenosis

EXAM:
CHEST - 2 VIEW

[w chest pa]
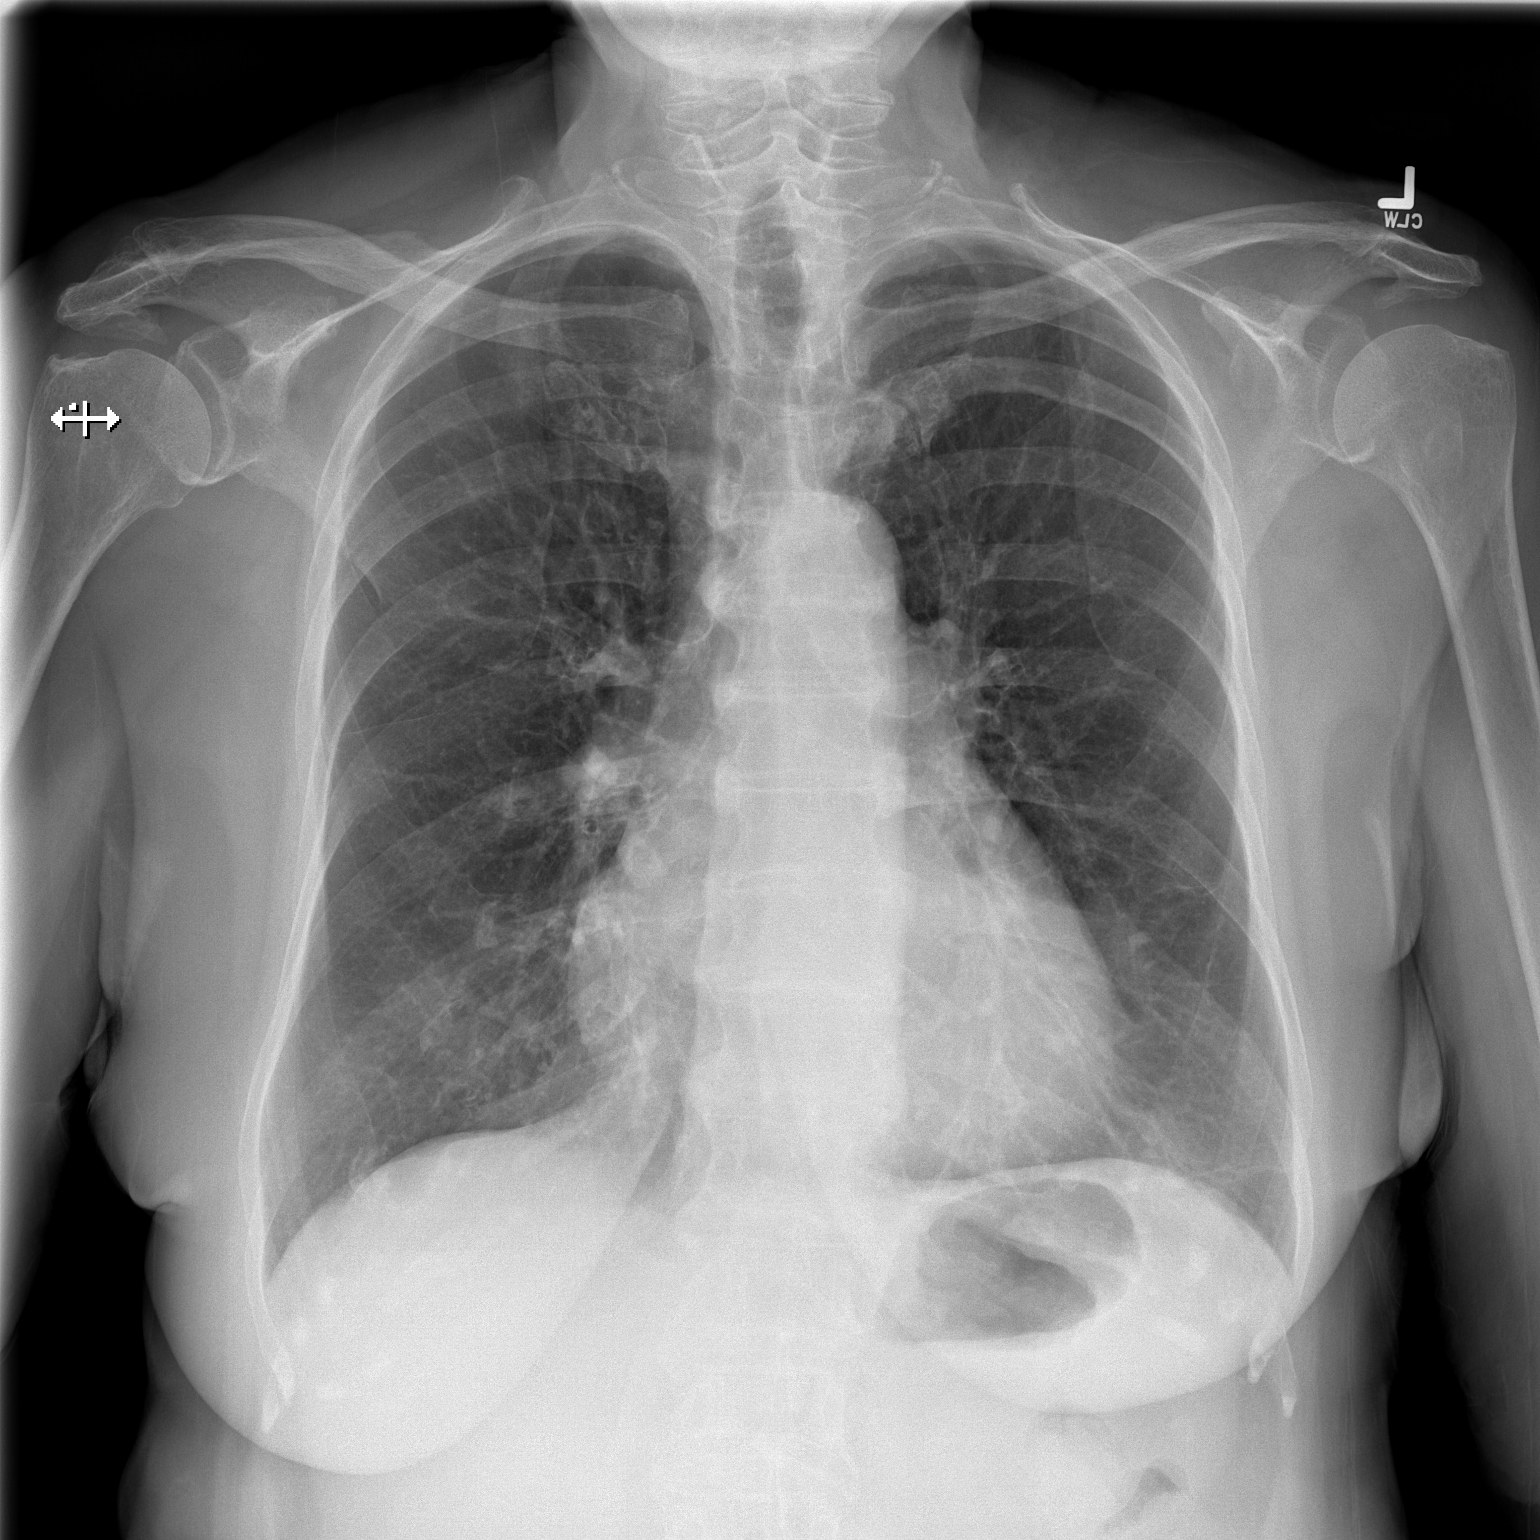

[w chest lat]
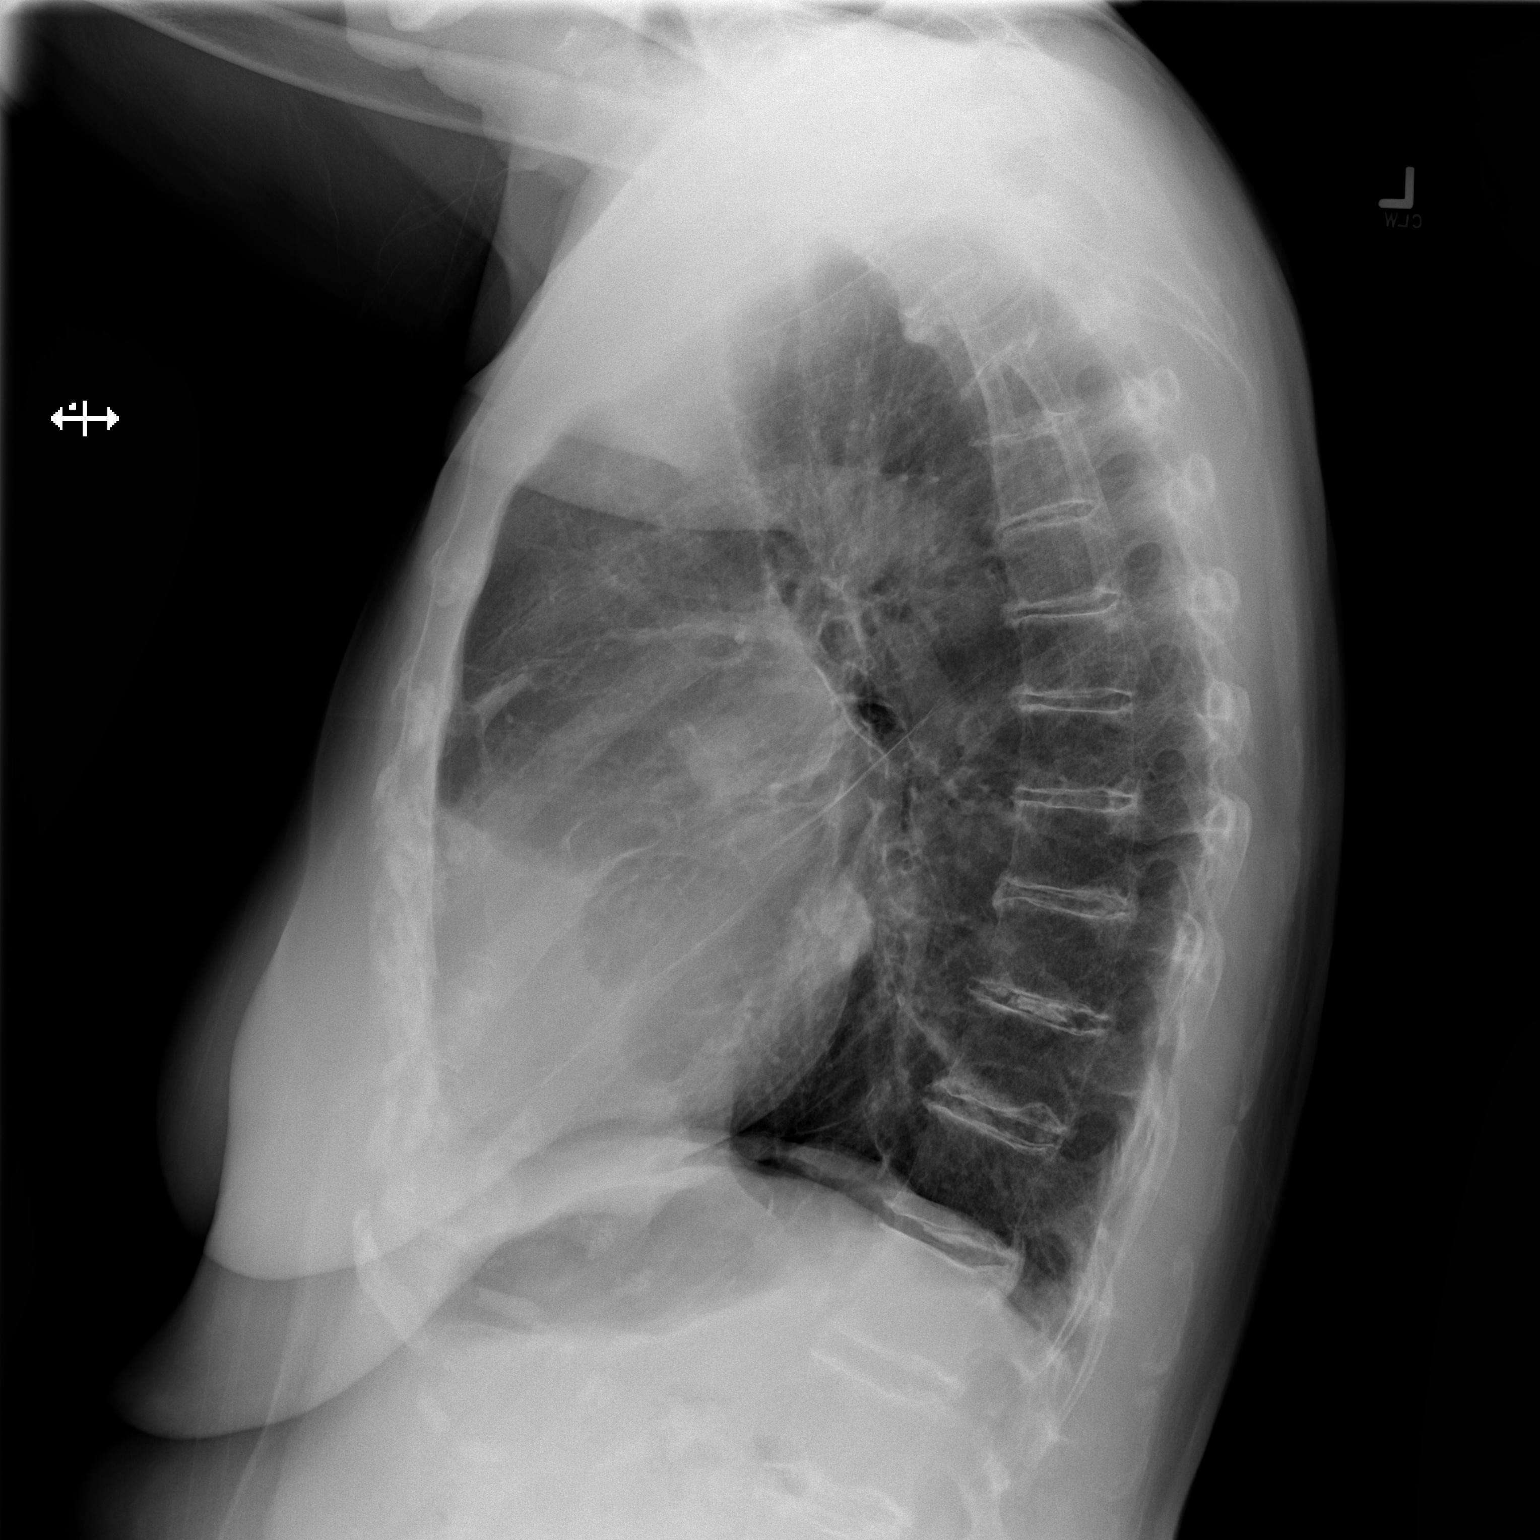

[2 of 2 positions shown; findings below may reference images not displayed]

FINDINGS: Cardiomediastinal silhouette unchanged in size and contour. No
evidence of central vascular congestion. No interlobular septal
thickening.

No pneumothorax or pleural effusion. Coarsened interstitial
markings, with no confluent airspace disease.

No acute displaced fracture. Degenerative changes of the spine.
IMPRESSION: Chronic changes without evidence of acute cardiopulmonary disease.
